# Patient Record
Sex: Female | Born: 1940
Health system: Southern US, Community
[De-identification: ages and names within clinical notes are randomized; demographics above are authoritative.]

## PROBLEM LIST (undated history)

## (undated) ENCOUNTER — Emergency Department (HOSPITAL_COMMUNITY): Admission: EM | Payer: Medicare HMO | Source: Home / Self Care

## (undated) DIAGNOSIS — Z800 Family history of malignant neoplasm of digestive organs: Secondary | ICD-10-CM

## (undated) DIAGNOSIS — R1319 Other dysphagia: Secondary | ICD-10-CM

## (undated) DIAGNOSIS — H269 Unspecified cataract: Secondary | ICD-10-CM

## (undated) DIAGNOSIS — H9319 Tinnitus, unspecified ear: Secondary | ICD-10-CM

## (undated) DIAGNOSIS — R1310 Dysphagia, unspecified: Secondary | ICD-10-CM

## (undated) DIAGNOSIS — M810 Age-related osteoporosis without current pathological fracture: Secondary | ICD-10-CM

## (undated) DIAGNOSIS — E119 Type 2 diabetes mellitus without complications: Secondary | ICD-10-CM

## (undated) DIAGNOSIS — R011 Cardiac murmur, unspecified: Secondary | ICD-10-CM

## (undated) DIAGNOSIS — I10 Essential (primary) hypertension: Secondary | ICD-10-CM

## (undated) DIAGNOSIS — R9389 Abnormal findings on diagnostic imaging of other specified body structures: Secondary | ICD-10-CM

## (undated) DIAGNOSIS — J309 Allergic rhinitis, unspecified: Secondary | ICD-10-CM

## (undated) DIAGNOSIS — N189 Chronic kidney disease, unspecified: Secondary | ICD-10-CM

## (undated) DIAGNOSIS — E7800 Pure hypercholesterolemia, unspecified: Secondary | ICD-10-CM

## (undated) DIAGNOSIS — F411 Generalized anxiety disorder: Secondary | ICD-10-CM

## (undated) DIAGNOSIS — K5730 Diverticulosis of large intestine without perforation or abscess without bleeding: Secondary | ICD-10-CM

## (undated) DIAGNOSIS — Z8489 Family history of other specified conditions: Secondary | ICD-10-CM

## (undated) DIAGNOSIS — R9431 Abnormal electrocardiogram [ECG] [EKG]: Secondary | ICD-10-CM

## (undated) DIAGNOSIS — D126 Benign neoplasm of colon, unspecified: Secondary | ICD-10-CM

## (undated) DIAGNOSIS — C801 Malignant (primary) neoplasm, unspecified: Secondary | ICD-10-CM

## (undated) HISTORY — DX: Pure hypercholesterolemia, unspecified: E78.00

## (undated) HISTORY — DX: Type 2 diabetes mellitus without complications: E11.9

## (undated) HISTORY — DX: Abnormal electrocardiogram (ECG) (EKG): R94.31

## (undated) HISTORY — DX: Other dysphagia: R13.19

## (undated) HISTORY — DX: Abnormal findings on diagnostic imaging of other specified body structures: R93.89

## (undated) HISTORY — DX: Essential (primary) hypertension: I10

## (undated) HISTORY — PX: COLONOSCOPY: SHX174

## (undated) HISTORY — DX: Benign neoplasm of colon, unspecified: D12.6

## (undated) HISTORY — DX: Age-related osteoporosis without current pathological fracture: M81.0

## (undated) HISTORY — DX: Diverticulosis of large intestine without perforation or abscess without bleeding: K57.30

## (undated) HISTORY — DX: Generalized anxiety disorder: F41.1

## (undated) HISTORY — DX: Allergic rhinitis, unspecified: J30.9

## (undated) HISTORY — DX: Family history of malignant neoplasm of digestive organs: Z80.0

## (undated) HISTORY — DX: Chronic kidney disease, unspecified: N18.9

## (undated) HISTORY — DX: Tinnitus, unspecified ear: H93.19

## (undated) HISTORY — DX: Unspecified cataract: H26.9

## (undated) HISTORY — PX: FOOT SURGERY: SHX648

## (undated) HISTORY — PX: OTHER SURGICAL HISTORY: SHX169

## (undated) HISTORY — DX: Dysphagia, unspecified: R13.10

## (undated) HISTORY — PX: CATARACT EXTRACTION: SUR2

## (undated) HISTORY — DX: Malignant (primary) neoplasm, unspecified: C80.1

## (undated) HISTORY — PX: UPPER GASTROINTESTINAL ENDOSCOPY: SHX188

---

## 1998-05-20 ENCOUNTER — Ambulatory Visit (HOSPITAL_COMMUNITY): Admission: RE | Admit: 1998-05-20 | Discharge: 1998-05-20 | Payer: Self-pay | Admitting: *Deleted

## 1999-05-23 ENCOUNTER — Ambulatory Visit (HOSPITAL_COMMUNITY): Admission: RE | Admit: 1999-05-23 | Discharge: 1999-05-23 | Payer: Self-pay | Admitting: *Deleted

## 1999-05-23 ENCOUNTER — Encounter: Payer: Self-pay | Admitting: *Deleted

## 1999-11-09 ENCOUNTER — Other Ambulatory Visit: Admission: RE | Admit: 1999-11-09 | Discharge: 1999-11-09 | Payer: Self-pay | Admitting: *Deleted

## 2000-05-24 ENCOUNTER — Ambulatory Visit (HOSPITAL_COMMUNITY): Admission: RE | Admit: 2000-05-24 | Discharge: 2000-05-24 | Payer: Self-pay | Admitting: *Deleted

## 2000-05-24 ENCOUNTER — Encounter: Payer: Self-pay | Admitting: *Deleted

## 2000-11-12 ENCOUNTER — Other Ambulatory Visit: Admission: RE | Admit: 2000-11-12 | Discharge: 2000-11-12 | Payer: Self-pay | Admitting: *Deleted

## 2001-05-28 ENCOUNTER — Encounter: Payer: Self-pay | Admitting: *Deleted

## 2001-05-28 ENCOUNTER — Ambulatory Visit (HOSPITAL_COMMUNITY): Admission: RE | Admit: 2001-05-28 | Discharge: 2001-05-28 | Payer: Self-pay | Admitting: *Deleted

## 2001-07-08 ENCOUNTER — Encounter (INDEPENDENT_AMBULATORY_CARE_PROVIDER_SITE_OTHER): Payer: Self-pay | Admitting: Gastroenterology

## 2001-11-18 ENCOUNTER — Other Ambulatory Visit: Admission: RE | Admit: 2001-11-18 | Discharge: 2001-11-18 | Payer: Self-pay | Admitting: *Deleted

## 2002-06-02 ENCOUNTER — Encounter: Payer: Self-pay | Admitting: *Deleted

## 2002-06-02 ENCOUNTER — Ambulatory Visit (HOSPITAL_COMMUNITY): Admission: RE | Admit: 2002-06-02 | Discharge: 2002-06-02 | Payer: Self-pay | Admitting: *Deleted

## 2002-11-24 ENCOUNTER — Other Ambulatory Visit: Admission: RE | Admit: 2002-11-24 | Discharge: 2002-11-24 | Payer: Self-pay | Admitting: *Deleted

## 2003-06-08 ENCOUNTER — Encounter: Payer: Self-pay | Admitting: *Deleted

## 2003-06-08 ENCOUNTER — Ambulatory Visit (HOSPITAL_COMMUNITY): Admission: RE | Admit: 2003-06-08 | Discharge: 2003-06-08 | Payer: Self-pay | Admitting: *Deleted

## 2003-12-01 ENCOUNTER — Other Ambulatory Visit: Admission: RE | Admit: 2003-12-01 | Discharge: 2003-12-01 | Payer: Self-pay | Admitting: *Deleted

## 2004-06-08 ENCOUNTER — Ambulatory Visit (HOSPITAL_COMMUNITY): Admission: RE | Admit: 2004-06-08 | Discharge: 2004-06-08 | Payer: Self-pay | Admitting: *Deleted

## 2004-12-05 ENCOUNTER — Other Ambulatory Visit: Admission: RE | Admit: 2004-12-05 | Discharge: 2004-12-05 | Payer: Self-pay | Admitting: *Deleted

## 2004-12-07 ENCOUNTER — Ambulatory Visit: Payer: Self-pay | Admitting: Pulmonary Disease

## 2004-12-12 ENCOUNTER — Ambulatory Visit: Payer: Self-pay

## 2005-01-03 ENCOUNTER — Ambulatory Visit: Payer: Self-pay | Admitting: Gastroenterology

## 2005-01-11 ENCOUNTER — Ambulatory Visit: Payer: Self-pay | Admitting: Gastroenterology

## 2005-02-02 ENCOUNTER — Ambulatory Visit: Payer: Self-pay | Admitting: Internal Medicine

## 2005-06-13 ENCOUNTER — Ambulatory Visit: Payer: Self-pay | Admitting: Pulmonary Disease

## 2005-06-26 ENCOUNTER — Ambulatory Visit (HOSPITAL_COMMUNITY): Admission: RE | Admit: 2005-06-26 | Discharge: 2005-06-26 | Payer: Self-pay | Admitting: *Deleted

## 2005-07-20 ENCOUNTER — Ambulatory Visit: Payer: Self-pay | Admitting: Pulmonary Disease

## 2005-10-25 ENCOUNTER — Ambulatory Visit: Payer: Self-pay | Admitting: Pulmonary Disease

## 2005-11-02 ENCOUNTER — Ambulatory Visit: Payer: Self-pay | Admitting: Pulmonary Disease

## 2005-11-09 ENCOUNTER — Ambulatory Visit: Payer: Self-pay | Admitting: Cardiology

## 2005-12-06 ENCOUNTER — Other Ambulatory Visit: Admission: RE | Admit: 2005-12-06 | Discharge: 2005-12-06 | Payer: Self-pay | Admitting: *Deleted

## 2005-12-12 ENCOUNTER — Ambulatory Visit: Payer: Self-pay | Admitting: Pulmonary Disease

## 2006-02-21 ENCOUNTER — Ambulatory Visit: Payer: Self-pay | Admitting: Pulmonary Disease

## 2006-02-25 ENCOUNTER — Ambulatory Visit: Payer: Self-pay | Admitting: Cardiovascular Disease

## 2006-03-13 ENCOUNTER — Ambulatory Visit: Payer: Self-pay | Admitting: Pulmonary Disease

## 2006-06-27 ENCOUNTER — Ambulatory Visit (HOSPITAL_COMMUNITY): Admission: RE | Admit: 2006-06-27 | Discharge: 2006-06-27 | Payer: Self-pay | Admitting: *Deleted

## 2006-07-04 ENCOUNTER — Ambulatory Visit: Payer: Self-pay | Admitting: Pulmonary Disease

## 2006-07-23 ENCOUNTER — Ambulatory Visit: Payer: Self-pay | Admitting: Pulmonary Disease

## 2006-10-04 ENCOUNTER — Ambulatory Visit: Payer: Self-pay | Admitting: Gastroenterology

## 2006-10-16 ENCOUNTER — Ambulatory Visit: Payer: Self-pay | Admitting: Gastroenterology

## 2006-11-06 ENCOUNTER — Ambulatory Visit: Payer: Self-pay | Admitting: Pulmonary Disease

## 2006-11-06 LAB — CONVERTED CEMR LAB
AST: 21 units/L (ref 0–37)
Albumin: 3.9 g/dL (ref 3.5–5.2)
Alkaline Phosphatase: 63 units/L (ref 39–117)
CO2: 26 meq/L (ref 19–32)
Chloride: 107 meq/L (ref 96–112)
Cholesterol: 163 mg/dL (ref 0–200)
Creatinine, Ser: 0.9 mg/dL (ref 0.4–1.2)
GFR calc Af Amer: 81 mL/min
GFR calc non Af Amer: 67 mL/min
Glucose, Bld: 101 mg/dL — ABNORMAL HIGH (ref 70–99)
LDL Cholesterol: 100 mg/dL — ABNORMAL HIGH (ref 0–99)
Total CHOL/HDL Ratio: 3.2

## 2006-12-24 ENCOUNTER — Other Ambulatory Visit: Admission: RE | Admit: 2006-12-24 | Discharge: 2006-12-24 | Payer: Self-pay | Admitting: *Deleted

## 2007-05-07 ENCOUNTER — Ambulatory Visit: Payer: Self-pay | Admitting: Pulmonary Disease

## 2007-05-07 LAB — CONVERTED CEMR LAB
ALT: 17 units/L (ref 0–35)
Albumin: 4.1 g/dL (ref 3.5–5.2)
BUN: 10 mg/dL (ref 6–23)
CO2: 27 meq/L (ref 19–32)
Calcium: 10.1 mg/dL (ref 8.4–10.5)
Chloride: 106 meq/L (ref 96–112)
Glucose, Bld: 111 mg/dL — ABNORMAL HIGH (ref 70–99)
HDL: 44.9 mg/dL (ref >39.0)
LDL Cholesterol: 107 mg/dL — ABNORMAL HIGH (ref 0–99)
Total CHOL/HDL Ratio: 3.7
VLDL: 13 mg/dL (ref 0–40)

## 2007-07-01 ENCOUNTER — Ambulatory Visit (HOSPITAL_COMMUNITY): Admission: RE | Admit: 2007-07-01 | Discharge: 2007-07-01 | Payer: Self-pay | Admitting: *Deleted

## 2007-07-29 DIAGNOSIS — D126 Benign neoplasm of colon, unspecified: Secondary | ICD-10-CM | POA: Insufficient documentation

## 2007-07-29 DIAGNOSIS — E119 Type 2 diabetes mellitus without complications: Secondary | ICD-10-CM

## 2007-07-29 DIAGNOSIS — I10 Essential (primary) hypertension: Secondary | ICD-10-CM | POA: Insufficient documentation

## 2007-07-29 DIAGNOSIS — E118 Type 2 diabetes mellitus with unspecified complications: Secondary | ICD-10-CM | POA: Insufficient documentation

## 2007-07-29 DIAGNOSIS — E785 Hyperlipidemia, unspecified: Secondary | ICD-10-CM

## 2007-07-29 DIAGNOSIS — E1169 Type 2 diabetes mellitus with other specified complication: Secondary | ICD-10-CM | POA: Insufficient documentation

## 2007-07-29 DIAGNOSIS — K219 Gastro-esophageal reflux disease without esophagitis: Secondary | ICD-10-CM | POA: Insufficient documentation

## 2007-07-29 DIAGNOSIS — Z87898 Personal history of other specified conditions: Secondary | ICD-10-CM | POA: Insufficient documentation

## 2007-07-29 DIAGNOSIS — M159 Polyosteoarthritis, unspecified: Secondary | ICD-10-CM | POA: Insufficient documentation

## 2007-07-29 DIAGNOSIS — J309 Allergic rhinitis, unspecified: Secondary | ICD-10-CM | POA: Insufficient documentation

## 2007-07-29 DIAGNOSIS — R7303 Prediabetes: Secondary | ICD-10-CM | POA: Insufficient documentation

## 2007-07-29 DIAGNOSIS — E782 Mixed hyperlipidemia: Secondary | ICD-10-CM | POA: Insufficient documentation

## 2007-08-01 ENCOUNTER — Ambulatory Visit: Payer: Self-pay | Admitting: Pulmonary Disease

## 2007-10-01 ENCOUNTER — Telehealth (INDEPENDENT_AMBULATORY_CARE_PROVIDER_SITE_OTHER): Payer: Self-pay | Admitting: *Deleted

## 2007-10-10 ENCOUNTER — Telehealth: Payer: Self-pay | Admitting: Pulmonary Disease

## 2007-11-12 ENCOUNTER — Ambulatory Visit: Payer: Self-pay | Admitting: Pulmonary Disease

## 2007-11-12 DIAGNOSIS — K5730 Diverticulosis of large intestine without perforation or abscess without bleeding: Secondary | ICD-10-CM | POA: Insufficient documentation

## 2007-11-12 DIAGNOSIS — F411 Generalized anxiety disorder: Secondary | ICD-10-CM | POA: Insufficient documentation

## 2007-11-12 DIAGNOSIS — H9319 Tinnitus, unspecified ear: Secondary | ICD-10-CM | POA: Insufficient documentation

## 2007-11-23 LAB — CONVERTED CEMR LAB
ALT: 17 units/L (ref 0–35)
Bilirubin Urine: NEGATIVE
Bilirubin, Direct: 0.1 mg/dL (ref 0.0–0.3)
Cholesterol: 163 mg/dL (ref 0–200)
Creatinine, Ser: 0.8 mg/dL (ref 0.4–1.2)
Eosinophils Absolute: 0 10*3/uL (ref 0.0–0.6)
GFR calc Af Amer: 92 mL/min
GFR calc non Af Amer: 76 mL/min
HCT: 39.3 % (ref 36.0–46.0)
Hemoglobin, Urine: NEGATIVE
Hemoglobin: 12.7 g/dL (ref 12.0–15.0)
Leukocytes, UA: NEGATIVE
Lymphocytes Relative: 43.3 % (ref 12.0–46.0)
Monocytes Relative: 6.2 % (ref 3.0–11.0)
Neutro Abs: 2.8 10*3/uL (ref 1.4–7.7)
Nitrite: NEGATIVE
Platelets: 316 10*3/uL (ref 150–400)
RDW: 12.3 % (ref 11.5–14.6)
Total Bilirubin: 0.6 mg/dL (ref 0.3–1.2)
Total CHOL/HDL Ratio: 3.2
Urine Glucose: NEGATIVE mg/dL
Urobilinogen, UA: 0.2 (ref 0.0–1.0)
WBC: 5.7 10*3/uL (ref 4.5–10.5)

## 2007-12-02 ENCOUNTER — Ambulatory Visit: Payer: Self-pay

## 2007-12-02 ENCOUNTER — Encounter: Payer: Self-pay | Admitting: Pulmonary Disease

## 2007-12-15 ENCOUNTER — Telehealth (INDEPENDENT_AMBULATORY_CARE_PROVIDER_SITE_OTHER): Payer: Self-pay | Admitting: *Deleted

## 2007-12-31 ENCOUNTER — Encounter: Payer: Self-pay | Admitting: Pulmonary Disease

## 2008-04-22 ENCOUNTER — Telehealth (INDEPENDENT_AMBULATORY_CARE_PROVIDER_SITE_OTHER): Payer: Self-pay | Admitting: *Deleted

## 2008-05-12 ENCOUNTER — Ambulatory Visit: Payer: Self-pay | Admitting: Pulmonary Disease

## 2008-05-12 DIAGNOSIS — R011 Cardiac murmur, unspecified: Secondary | ICD-10-CM | POA: Insufficient documentation

## 2008-05-15 LAB — CONVERTED CEMR LAB
ALT: 18 units/L (ref 0–35)
AST: 21 units/L (ref 0–37)
Alkaline Phosphatase: 70 units/L (ref 39–117)
BUN: 11 mg/dL (ref 6–23)
Bilirubin, Direct: 0.1 mg/dL (ref 0.0–0.3)
CO2: 29 meq/L (ref 19–32)
Chloride: 105 meq/L (ref 96–112)
Cholesterol: 144 mg/dL (ref 0–200)
GFR calc Af Amer: 92 mL/min
GFR calc non Af Amer: 76 mL/min
LDL Cholesterol: 80 mg/dL (ref 0–99)
Sodium: 142 meq/L (ref 135–145)
Total Protein: 7.3 g/dL (ref 6.0–8.3)

## 2008-06-30 ENCOUNTER — Ambulatory Visit: Payer: Self-pay | Admitting: Pulmonary Disease

## 2008-07-01 ENCOUNTER — Ambulatory Visit (HOSPITAL_COMMUNITY): Admission: RE | Admit: 2008-07-01 | Discharge: 2008-07-01 | Payer: Self-pay | Admitting: Gynecology

## 2008-08-27 ENCOUNTER — Telehealth (INDEPENDENT_AMBULATORY_CARE_PROVIDER_SITE_OTHER): Payer: Self-pay | Admitting: *Deleted

## 2008-09-21 ENCOUNTER — Encounter: Payer: Self-pay | Admitting: Pulmonary Disease

## 2008-09-30 ENCOUNTER — Encounter (INDEPENDENT_AMBULATORY_CARE_PROVIDER_SITE_OTHER): Payer: Self-pay | Admitting: *Deleted

## 2008-09-30 ENCOUNTER — Ambulatory Visit: Payer: Self-pay | Admitting: Pulmonary Disease

## 2008-11-09 ENCOUNTER — Telehealth (INDEPENDENT_AMBULATORY_CARE_PROVIDER_SITE_OTHER): Payer: Self-pay | Admitting: *Deleted

## 2008-11-16 ENCOUNTER — Telehealth (INDEPENDENT_AMBULATORY_CARE_PROVIDER_SITE_OTHER): Payer: Self-pay | Admitting: *Deleted

## 2008-11-17 ENCOUNTER — Telehealth: Payer: Self-pay | Admitting: Physician Assistant

## 2008-11-17 ENCOUNTER — Ambulatory Visit: Payer: Self-pay | Admitting: Gastroenterology

## 2008-11-17 DIAGNOSIS — R1319 Other dysphagia: Secondary | ICD-10-CM | POA: Insufficient documentation

## 2008-11-23 ENCOUNTER — Ambulatory Visit: Payer: Self-pay | Admitting: Gastroenterology

## 2008-11-25 ENCOUNTER — Telehealth: Payer: Self-pay | Admitting: Gastroenterology

## 2008-11-29 ENCOUNTER — Telehealth (INDEPENDENT_AMBULATORY_CARE_PROVIDER_SITE_OTHER): Payer: Self-pay | Admitting: *Deleted

## 2008-12-28 ENCOUNTER — Ambulatory Visit: Payer: Self-pay | Admitting: Pulmonary Disease

## 2009-01-21 ENCOUNTER — Encounter: Payer: Self-pay | Admitting: Pulmonary Disease

## 2009-03-17 ENCOUNTER — Ambulatory Visit: Payer: Self-pay | Admitting: Internal Medicine

## 2009-03-17 ENCOUNTER — Encounter: Payer: Self-pay | Admitting: Adult Health

## 2009-03-17 LAB — CONVERTED CEMR LAB
Ketones, ur: NEGATIVE mg/dL
Leukocytes, UA: NEGATIVE
Specific Gravity, Urine: 1.015 (ref 1.000–1.030)
Total Protein, Urine: NEGATIVE mg/dL
Urine Glucose: NEGATIVE mg/dL

## 2009-06-28 ENCOUNTER — Ambulatory Visit: Payer: Self-pay | Admitting: Pulmonary Disease

## 2009-06-28 DIAGNOSIS — R9431 Abnormal electrocardiogram [ECG] [EKG]: Secondary | ICD-10-CM | POA: Insufficient documentation

## 2009-06-28 LAB — CONVERTED CEMR LAB
ALT: 19 units/L (ref 0–35)
AST: 22 units/L (ref 0–37)
Albumin: 3.9 g/dL (ref 3.5–5.2)
Alkaline Phosphatase: 60 units/L (ref 39–117)
BUN: 8 mg/dL (ref 6–23)
Basophils Absolute: 0 10*3/uL (ref 0.0–0.1)
Basophils Relative: 0.7 % (ref 0.0–3.0)
Bilirubin, Direct: 0 mg/dL (ref 0.0–0.3)
Chloride: 108 meq/L (ref 96–112)
Cholesterol: 158 mg/dL (ref 0–200)
Hemoglobin: 13 g/dL (ref 12.0–15.0)
LDL Cholesterol: 99 mg/dL (ref 0–99)
Lymphs Abs: 2.3 10*3/uL (ref 0.7–4.0)
Monocytes Absolute: 0.4 10*3/uL (ref 0.1–1.0)
Neutro Abs: 2.5 10*3/uL (ref 1.4–7.7)
Neutrophils Relative %: 46 % (ref 43.0–77.0)
Platelets: 252 10*3/uL (ref 150.0–400.0)
Potassium: 4.4 meq/L (ref 3.5–5.1)
RBC: 4.69 M/uL (ref 3.87–5.11)
RDW: 12.3 % (ref 11.5–14.6)
TSH: 1.2 microintl units/mL (ref 0.35–5.50)
Total CHOL/HDL Ratio: 3

## 2009-07-01 ENCOUNTER — Telehealth (INDEPENDENT_AMBULATORY_CARE_PROVIDER_SITE_OTHER): Payer: Self-pay | Admitting: *Deleted

## 2009-07-14 ENCOUNTER — Ambulatory Visit (HOSPITAL_COMMUNITY): Admission: RE | Admit: 2009-07-14 | Discharge: 2009-07-14 | Payer: Self-pay | Admitting: Gynecology

## 2009-07-15 ENCOUNTER — Ambulatory Visit: Payer: Self-pay | Admitting: Pulmonary Disease

## 2009-08-23 ENCOUNTER — Telehealth: Payer: Self-pay | Admitting: Pulmonary Disease

## 2009-09-14 ENCOUNTER — Telehealth (INDEPENDENT_AMBULATORY_CARE_PROVIDER_SITE_OTHER): Payer: Self-pay | Admitting: *Deleted

## 2009-10-27 ENCOUNTER — Telehealth (INDEPENDENT_AMBULATORY_CARE_PROVIDER_SITE_OTHER): Payer: Self-pay | Admitting: *Deleted

## 2009-11-16 ENCOUNTER — Telehealth: Payer: Self-pay | Admitting: Pulmonary Disease

## 2009-11-22 ENCOUNTER — Ambulatory Visit: Payer: Self-pay | Admitting: Pulmonary Disease

## 2009-12-12 ENCOUNTER — Telehealth (INDEPENDENT_AMBULATORY_CARE_PROVIDER_SITE_OTHER): Payer: Self-pay | Admitting: *Deleted

## 2009-12-28 ENCOUNTER — Ambulatory Visit: Payer: Self-pay | Admitting: Pulmonary Disease

## 2010-01-01 LAB — CONVERTED CEMR LAB
AST: 19 units/L (ref 0–37)
Albumin: 4 g/dL (ref 3.5–5.2)
Alkaline Phosphatase: 53 units/L (ref 39–117)
Bilirubin, Direct: 0.1 mg/dL (ref 0.0–0.3)
Calcium: 9.8 mg/dL (ref 8.4–10.5)
Cholesterol: 162 mg/dL (ref 0–200)
Creatinine, Ser: 0.9 mg/dL (ref 0.4–1.2)
GFR calc non Af Amer: 79.75 mL/min (ref >60)
HDL: 52.8 mg/dL (ref >39.00)
Hgb A1c MFr Bld: 6.1 % (ref 4.6–6.5)
LDL Cholesterol: 97 mg/dL (ref 0–99)
Sodium: 141 meq/L (ref 135–145)
Total Bilirubin: 0.2 mg/dL — ABNORMAL LOW (ref 0.3–1.2)
Triglycerides: 63 mg/dL (ref 0.0–149.0)

## 2010-01-16 ENCOUNTER — Telehealth (INDEPENDENT_AMBULATORY_CARE_PROVIDER_SITE_OTHER): Payer: Self-pay | Admitting: *Deleted

## 2010-01-25 ENCOUNTER — Encounter: Payer: Self-pay | Admitting: Pulmonary Disease

## 2010-02-09 ENCOUNTER — Ambulatory Visit: Payer: Self-pay | Admitting: Pulmonary Disease

## 2010-02-22 ENCOUNTER — Ambulatory Visit: Payer: Self-pay | Admitting: Gastroenterology

## 2010-06-28 ENCOUNTER — Ambulatory Visit: Payer: Self-pay | Admitting: Pulmonary Disease

## 2010-06-28 ENCOUNTER — Encounter: Payer: Self-pay | Admitting: Pulmonary Disease

## 2010-07-01 LAB — CONVERTED CEMR LAB
ALT: 16 units/L (ref 0–35)
AST: 20 units/L (ref 0–37)
Albumin: 4 g/dL (ref 3.5–5.2)
Basophils Relative: 0.6 % (ref 0.0–3.0)
Bilirubin Urine: NEGATIVE
Bilirubin, Direct: 0.1 mg/dL (ref 0.0–0.3)
Calcium: 9.9 mg/dL (ref 8.4–10.5)
Chloride: 105 meq/L (ref 96–112)
Cholesterol: 169 mg/dL (ref 0–200)
Creatinine, Ser: 0.8 mg/dL (ref 0.4–1.2)
Eosinophils Absolute: 0 10*3/uL (ref 0.0–0.7)
HDL: 44.6 mg/dL (ref >39.00)
Hemoglobin, Urine: NEGATIVE
Hemoglobin: 12.6 g/dL (ref 12.0–15.0)
LDL Cholesterol: 113 mg/dL — ABNORMAL HIGH (ref 0–99)
MCHC: 33.5 g/dL (ref 30.0–36.0)
Monocytes Absolute: 0.4 10*3/uL (ref 0.1–1.0)
Nitrite: NEGATIVE
RBC: 4.5 M/uL (ref 3.87–5.11)
TSH: 1.31 microintl units/mL (ref 0.35–5.50)
Total CHOL/HDL Ratio: 4
Total Protein, Urine: NEGATIVE mg/dL
Triglycerides: 58 mg/dL (ref 0.0–149.0)
Urine Glucose: NEGATIVE mg/dL
Urobilinogen, UA: 1 (ref 0.0–1.0)

## 2010-07-10 ENCOUNTER — Telehealth (INDEPENDENT_AMBULATORY_CARE_PROVIDER_SITE_OTHER): Payer: Self-pay | Admitting: *Deleted

## 2010-07-19 ENCOUNTER — Ambulatory Visit (HOSPITAL_COMMUNITY): Admission: RE | Admit: 2010-07-19 | Discharge: 2010-07-19 | Payer: Self-pay | Admitting: Gynecology

## 2010-07-25 ENCOUNTER — Telehealth: Payer: Self-pay | Admitting: Pulmonary Disease

## 2010-07-26 ENCOUNTER — Telehealth (INDEPENDENT_AMBULATORY_CARE_PROVIDER_SITE_OTHER): Payer: Self-pay | Admitting: *Deleted

## 2010-10-25 ENCOUNTER — Telehealth (INDEPENDENT_AMBULATORY_CARE_PROVIDER_SITE_OTHER): Payer: Self-pay | Admitting: *Deleted

## 2010-10-25 ENCOUNTER — Telehealth: Payer: Self-pay | Admitting: Gastroenterology

## 2010-10-26 ENCOUNTER — Encounter: Payer: Self-pay | Admitting: Gastroenterology

## 2010-10-26 ENCOUNTER — Encounter (INDEPENDENT_AMBULATORY_CARE_PROVIDER_SITE_OTHER): Payer: Self-pay | Admitting: *Deleted

## 2010-11-09 ENCOUNTER — Ambulatory Visit (HOSPITAL_COMMUNITY)
Admission: RE | Admit: 2010-11-09 | Discharge: 2010-11-09 | Disposition: A | Discharge status: Home / Self Care | Payer: Medicare Other | Source: Ambulatory Visit | Attending: Gastroenterology | Admitting: Gastroenterology

## 2010-11-09 ENCOUNTER — Encounter: Payer: Self-pay | Admitting: Gastroenterology

## 2010-11-09 ENCOUNTER — Encounter: Payer: Medicare Other | Admitting: Gastroenterology

## 2010-11-09 DIAGNOSIS — Z01812 Encounter for preprocedural laboratory examination: Secondary | ICD-10-CM | POA: Insufficient documentation

## 2010-11-09 DIAGNOSIS — R1310 Dysphagia, unspecified: Secondary | ICD-10-CM

## 2010-11-09 DIAGNOSIS — K449 Diaphragmatic hernia without obstruction or gangrene: Secondary | ICD-10-CM

## 2010-11-09 DIAGNOSIS — K222 Esophageal obstruction: Secondary | ICD-10-CM

## 2010-11-09 DIAGNOSIS — K440 Diaphragmatic hernia with obstruction, without gangrene: Secondary | ICD-10-CM | POA: Insufficient documentation

## 2010-11-09 LAB — GLUCOSE, CAPILLARY: Glucose-Capillary: 86 mg/dL (ref 70–99)

## 2010-11-09 NOTE — Assessment & Plan Note (Signed)
Review of gastrointestinal problems: 1. Dysphasia, likely from Schatzki's ring noted and dilated to 20 mm during EGD February, 2010. 2. History of colonic polyps, Unclear pathology since they were not sinceTo pathology, SML 1998.  Never had confirmed adenomatous polyps on followup colonoscopy 2002, 2008. Was recommended by [facility] to have a repeat colonoscopy at 7 year interval following her last one in 2008   History of Present Illness Visit Type: Follow-up Visit Primary GI MD: Rob Bunting MD Primary Provider: Alroy Dust, MD Requesting Provider: Alroy Dust, MD Chief Complaint: dysphagia History of Present Illness:     pleasant 70 year old woman whom I saw about one and a half years ago at the time of an EGD. Those results are summarized above.  She has recently had some dyspeptic-type symptoms. She is very clear today that she did not have trouble with swallowing or dysphagia. She would feel that food hung heavy in her stomach for 2-3 hours after eating however.   she was told to double her proton pump inhibitor to twice daily. She did that for one to 2 days and her symptoms completely resolved.  she stopped the recommeded prilosec for unclear reasons.  Lately she is feeling fine, withou any dysphagia or dyspepsia.    has lost 5 pounds since visit one year ago here in GI.           Current Medications (verified): 1)  Fexofenadine Hcl 180 Mg Tabs (Fexofenadine Hcl) .... Take 1 Tablet By Mouth Once A Day As Needed For Allergies.Marland KitchenMarland Kitchen 2)  Adult Aspirin Ec Low Strength 81 Mg  Tbec (Aspirin) .... Take 1 Tablet By Mouth Once A Day 3)  Norvasc 10 Mg  Tabs (Amlodipine Besylate) .... Take 1 Tablet By Mouth Once A Day 4)  Simvastatin 40 Mg  Tabs (Simvastatin) .... Take 1 Tablet At Bedtime 5)  Metformin Hcl 500 Mg  Tabs (Metformin Hcl) .... Take 1 Tablet Two Times A Day 6)  Omeprazole 20 Mg Cpdr (Omeprazole) .... Take 1 Cap By Mouth Once Daily- 30 Min Before A Meal... 7)  Accu-Chek Aviva  Strp  (Glucose Blood) .... Use As Directed 8)  Accu-Chek Aviva  Kit (Blood Glucose Monitoring Suppl) .... Use As Directed 9)  Accu-Chek Aviva  Soln (Blood Glucose Calibration) 10)  Alprazolam 0.5 Mg Tabs (Alprazolam) .... Take 1/2 To 1 Tab By Mouth Three Times A Day As Needed For Nerves...  Allergies (verified): No Known Drug Allergies  Vital Signs:  Patient profile:   70 year old female Height:      64 inches Weight:      174.25 pounds BMI:     30.02 Pulse rate:   76 / minute Pulse rhythm:   regular BP sitting:   116 / 68  (left arm) Cuff size:   regular  Vitals Entered By: June McMurray CMA Duncan Dull) (Feb 22, 2010 2:12 PM)  Physical Exam  Additional Exam:  Constitutional: generally well appearing Psychiatric: alert and oriented times 3 Abdomen: soft, non-tender, non-distended, normal bowel sounds    Impression & Recommendations:  Problem # 1:  Dyspepsia, resolved her symptoms resolved after just one to 2 days of proton pump inhibitor. She stop the medicine at that point. She has not been bothered and to 3 weeks. She had an EGD one year ago which was normal except for a Schatzki's ring. I do not think we need to repeat her endoscopy since her symptoms have resolved already. She knows however to call if new problems arise.  Patient Instructions: 1)  Lactose intolerance handout given. 2)  Call Dr. Christella Hartigan' office if new symptoms arise. 3)  The medication list was reviewed and reconciled.  All changed / newly prescribed medications were explained.  A complete medication list was provided to the patient / caregiver.

## 2010-11-09 NOTE — Assessment & Plan Note (Signed)
Summary: rov 6 months///kp   Primary Care Provider:  Alroy Dust, MD  CC:  6 month ROV & review of mult medical prob....  History of Present Illness: 70 y/o BF here for a follow up visit and on-going management of mult med problems including- HBP, DM, Chol, etc...    ~  December 28, 2008:  she is c/o feeling "puffy" in her abd and is considering taking "Digest-Zyme" from her chiropractor... we discussed poss alternatives such as Activia yogurt and Align probiotics... her weight = 180# but she objects "it's just 178 on my home scale", and she is happy w/ her current weight, not motivated to reduce "I'd look terrible"... BS's= 100-115 OK... she had some dysphagia & saw DrJacobs w/ EGD showing Schatzki ring w/ balloon dilatation- improved...   ~  June 28, 2009:  30mo f/u doing well- just notes some mild constipation & we discussed options for Miralax/ Senakot-S/ etc... BP controlled- denies CP, palpit, etc...   ~  December 28, 2009:  30mo f/u doing well- no new complaints or concerns, notes rare palpit, no CP, no SOB, no edema... she saw TP 2/11 w/ HA and BP elevated but now she says this was a reaction to "ginsing tea"> no recurrence & BP controlled on Norvasc... requests refills w/ 90d supplies.    Current Problem List:  ALLERGIC RHINITIS (ICD-477.9) - she uses Allegra Prn...  HYPERTENSION (ICD-401.9) - controlled on NORVASC 10mg /d and takes ASA 81mg /d as well...  BP= 110/70 today & similar at home... tolerated well and denies HA, fatigue, visual changes, CP, palipit, dizziness, syncope, dyspnea, edema, etc...  ABNORMAL ELECTROCARDIOGRAM (ICD-794.31) & ECHOCARDIOGRAM, ABNORMAL (ICD-793.2) - she is asymtomatic without CP, palpit, SOB, dizziness or syncope... she is active w/ walking, treadmill, etc...  ~  baseline EKG showed NSR, poor R progression V1-3 w/ NSSTTWA... no change in f/u tracings.  ~  2DEcho in 2006 showed only some mild SAM, tr MR, tr AI, vigorous LVF w/ sm cavity & EF=75% (no  change from 2002)...  ~  2DEcho 2/09 showed:  Mod thickening of the MV leaflets, & mild calcif of the AoV... The left ventricle was mildly dilated. Overall left ventricular systolic function was normal. Left ventricular ejection fraction was estimated to be 60 %. There were no left ventricular regional wall motion abnormalities. Left ventricular wall thickness was mildly increased. There was mild focal basal septal hypertrophy. There was no systolic anterior motion of the mitral valve. There was Doppler evidence for dynamic left ventricular mid-cavity obstruction during Valsalva, with a peak velocity of 2.1 m/sec , and with a peak gradient of 18 mmHg. Doppler parameters were consistent with abnormal left ventricular relaxation.  HYPERCHOLESTEROLEMIA (ICD-272.0) - on SIMVASTATIN 40mg /d.  ~  FLP 7/08 showed TChol 165, Tg 65, HDL 45, LDL 107  ~  FLP 2/09 showed TChol 163, Tg 66, HDL 51, LDL 98... continue same.  ~  FLP 8/09 showed TChol 144, TG 63, HDL 52, LDL 80  ~  FLP 9/10 showed TChol 158, TG 60, HDL 47, LDL 99  ~  FLP 3/11 showed TChol 162, TG 63, HDL 53, LDL 97  DM (ICD-250.00) - doing well on METFORMIN 500mg Bid + diet... BS at home all  ~100-115 per pt.  ~  labs 7/08 showed BS=111, HgA1c=6.1  ~  labs 2.09 showed BS= 112, HgA1c= 6.3.Marland KitchenMarland Kitchen continue same.  ~  labs 8/09 showed BS= 111, HgA1c= 6.4  ~  labs 9/10 showed BS= 106, A1c= not done.  ~  labs 3/11 showed BS= 93, A1c= 6.1  GERD (ICD-530.81) - she had EGD in 2002 showing 2cm HH, gastritis, duodenitis... controlled on OMEPRAZOLE 20mg /d...  ~  EGD 2/10 by DrJacobs showed HH/ Schatzki ring w/ balloon dilatation...  DIVERTICULOSIS OF COLON (ICD-562.10) & COLONIC POLYPS (ICD-211.3) - last colonoscopy was 1/08 by DrSam showing divertics only... f/u 78yrs planned due to [facility] w/ colon Ca...  DEGENERATIVE JOINT DISEASE, GENERALIZED (ICD-715.00)  HEADACHES, HX OF (ICD-V13.8) - CTBrain in 2007 w/ some atrophy.  Hx of TINNITUS (ICD-388.30)  ANXIETY  (ICD-300.00) - on ALPRAZOLAM 0.5mg  Prn...  Health Maintenance:  ~  GI= DrJacobs as above...  ~  GYN= sees DrMezer since DrFore passed away- PAP, Mammogram & BMD thru his office.  ~  Immunizations:  yearly flu shots... PNEUMOVAX 9/10 at age 81...   Allergies (verified): No Known Drug Allergies  Past History:  Past Medical History:  ALLERGIC RHINITIS (ICD-477.9) HYPERTENSION (ICD-401.9) ABNORMAL ELECTROCARDIOGRAM (ICD-794.31) ECHOCARDIOGRAM, ABNORMAL (ICD-793.2) HYPERCHOLESTEROLEMIA (ICD-272.0) DM (ICD-250.00) GERD (ICD-530.81) DYSPHAGIA (ICD-787.29) DIVERTICULOSIS OF COLON (ICD-562.10) COLONIC POLYPS (ICD-211.3) FAMILY HX COLON CANCER (ICD-V16.0) DEGENERATIVE JOINT DISEASE, GENERALIZED (ICD-715.00) HEADACHES, HX OF (ICD-V13.8) Hx of TINNITUS (ICD-388.30) ANXIETY (ICD-300.00)  Past Surgical History: S/P C Section S/P Bilat foot surg for hammer toes     Family History: Father died age 66 w/ lung cancer, ASHD, stroke. Mother died age 57 w/ MI, HBP. 7 Siblings:  4 Brothers- one w/ prostate cancer 3 Sisters- one died w/ colon cancer  Social History: Married, husb= [place], 76yrs 3 Children- 1 son w/ hodgkins, 2 daugh alive & well. Ex-smoker, quit 10 years ago No alcohol High School education employ as cook  Review of Systems      See HPI  The patient denies anorexia, fever, weight loss, weight gain, vision loss, decreased hearing, hoarseness, chest pain, syncope, dyspnea on exertion, peripheral edema, prolonged cough, headaches, hemoptysis, abdominal pain, melena, hematochezia, severe indigestion/heartburn, hematuria, incontinence, muscle weakness, suspicious skin lesions, transient blindness, difficulty walking, depression, unusual weight change, abnormal bleeding, enlarged lymph nodes, and angioedema.    Vital Signs:  Patient profile:   70 year old female Height:      64 inches Weight:      179.13 pounds BMI:     30.86 O2 Sat:      97 % on Room air Temp:      97.3 degrees F oral Pulse rate:   78 / minute BP sitting:   110 / 70  (right arm) Cuff size:   regular  Vitals Entered By: Randell Loop CMA (December 28, 2009 9:53 AM)  O2 Sat at Rest %:  97 O2 Flow:  Room air CC: 6 month ROV & review of mult medical prob... Is Patient Diabetic? No Pain Assessment Patient in pain? no      Comments meds updated today   Physical Exam  Additional Exam:  WD, WN, 69 y/o BF in NAD...  GENERAL:  Alert & oriented; pleasant & cooperative... HEENT:  [place]/AT, EOM-wnl, PERRLA, EACs-clear, TMs-wnl, NOSE-clear, THROAT-clear & wnl. NECK:  Supple w/ fairROM; no JVD; normal carotid impulses w/o bruits; no thyromegaly or nodules palpated; no lymphadenopathy. CHEST:  Clear to P & A; without wheezes/ rales/ or rhonchi. HEART:  Regular Rhythm, gr 1/6 SEM without rubs or gallops heard... ABDOMEN:  Soft & nontender, mild panniculus, normal bowel sounds; no organomegaly or masses detected. EXT: without deformities, mild arthritic changes; no varicose veins/ venous insuffic/ or edema. NEURO:  CN's intact;  no focal neuro deficits... DERM:  No lesions noted; no rash etc...    Impression & Recommendations:  Problem # 1:  HYPERTENSION (ICD-401.9) BP controlled- continue same meds. Her updated medication list for this problem includes:    Norvasc 10 Mg Tabs (Amlodipine besylate) .Marland Kitchen... Take 1 tablet by mouth once a day  Orders: TLB-Lipid Panel (80061-LIPID) TLB-BMP (Basic Metabolic Panel-BMET) (80048-METABOL) TLB-Hepatic/Liver Function Pnl (80076-HEPATIC) TLB-A1C / Hgb A1C (Glycohemoglobin) (83036-A1C)  Problem # 2:  ECHOCARDIOGRAM, ABNORMAL (ICD-793.2) She remains asymptomatic...   Problem # 3:  HYPERCHOLESTEROLEMIA (ICD-272.0) Controlled on Simva40... continue same. Her updated medication list for this problem includes:    Simvastatin 40 Mg Tabs (Simvastatin) .Marland Kitchen... Take 1 tablet at bedtime  Problem # 4:  DM (ICD-250.00) Stable on Metformin Bid... Her updated  medication list for this problem includes:    Adult Aspirin Ec Low Strength 81 Mg Tbec (Aspirin) .Marland Kitchen... Take 1 tablet by mouth once a day    Metformin Hcl 500 Mg Tabs (Metformin hcl) .Marland Kitchen... Take 1 tablet two times a day  Problem # 5:  GERD (ICD-530.81) GI is stable & followed by DrJacobs... Her updated medication list for this problem includes:    Omeprazole 20 Mg Cpdr (Omeprazole) .Marland Kitchen... Take 1 cap by mouth once daily- 30 min before a meal...  Problem # 6:  ANXIETY (ICD-300.00) Rx for Alprazolam refilled per request... Her updated medication list for this problem includes:    Alprazolam 0.5 Mg Tabs (Alprazolam) .Marland Kitchen... Take 1/2 to 1 tab by mouth three times a day as needed for nerves...  Complete Medication List: 1)  Fexofenadine Hcl 180 Mg Tabs (Fexofenadine hcl) .... Take 1 tablet by mouth once a day as needed for allergies.Marland KitchenMarland Kitchen 2)  Adult Aspirin Ec Low Strength 81 Mg Tbec (Aspirin) .... Take 1 tablet by mouth once a day 3)  Norvasc 10 Mg Tabs (Amlodipine besylate) .... Take 1 tablet by mouth once a day 4)  Simvastatin 40 Mg Tabs (Simvastatin) .... Take 1 tablet at bedtime 5)  Metformin Hcl 500 Mg Tabs (Metformin hcl) .... Take 1 tablet two times a day 6)  Omeprazole 20 Mg Cpdr (Omeprazole) .... Take 1 cap by mouth once daily- 30 min before a meal... 7)  Onetouch Ultra Test Strp (Glucose blood) .... Test blood sugar as directed 8)  Onetouch Ultra System W/device Kit (Blood glucose monitoring suppl) .... Use as directed 9)  Accu-chek Aviva Strp (Glucose blood) .... Test one time a day 10)  Alprazolam 0.5 Mg Tabs (Alprazolam) .... Take 1/2 to 1 tab by mouth three times a day as needed for nerves...  Other Orders: Prescription Created Electronically [phone]) Tdap => 23yrs IM (60454) Admin 1st Vaccine (09811)  Patient Instructions: 1)  Today we updated your med list- see below.... 2)  We refilled your meds for 2011... 3)  Today we did your follow up FASTING blood work...  please call the  "phone tree" in a few days for your lab results.Marland KitchenMarland Kitchen  4)  We also gave you the combination Tetanus vaccine called the TDAP- it should be good for 27yrs. 5)  Let's get on track w/ our diet + exerc program... 6)  Call for any problems.Marland KitchenMarland Kitchen 7)  Please schedule a follow-up appointment in 6 months. Prescriptions: ALPRAZOLAM 0.5 MG TABS (ALPRAZOLAM) take 1/2 to 1 tab by mouth three times a day as needed for nerves...  #100 x prn   Entered and Authorized by:   Michele Mcalpine MD   Signed by:   Michele Mcalpine MD on  12/28/2009   Method used:   Print then Give to Patient   RxID:   [id number] OMEPRAZOLE 20 MG CPDR (OMEPRAZOLE) take 1 cap by mouth once daily- 30 min before a meal...  #90 x prn   Entered and Authorized by:   Michele Mcalpine MD   Signed by:   Michele Mcalpine MD on 12/28/2009   Method used:   Print then Give to Patient   RxID:   [id number] METFORMIN HCL 500 MG  TABS (METFORMIN HCL) take 1 tablet two times a day  #180 x prn   Entered and Authorized by:   Michele Mcalpine MD   Signed by:   Michele Mcalpine MD on 12/28/2009   Method used:   Print then Give to Patient   RxID:   [id number] SIMVASTATIN 40 MG  TABS (SIMVASTATIN) take 1 tablet at bedtime  #90 x prn   Entered and Authorized by:   Michele Mcalpine MD   Signed by:   Michele Mcalpine MD on 12/28/2009   Method used:   Print then Give to Patient   RxID:   [id number] NORVASC 10 MG  TABS (AMLODIPINE BESYLATE) Take 1 tablet by mouth once a day  #90 x prn   Entered and Authorized by:   Michele Mcalpine MD   Signed by:   Michele Mcalpine MD on 12/28/2009   Method used:   Print then Give to Patient   RxID:   [id number] FEXOFENADINE HCL 180 MG TABS (FEXOFENADINE HCL) Take 1 tablet by mouth once a day as needed for allergies...  #90 x prn   Entered and Authorized by:   Michele Mcalpine MD   Signed by:   Michele Mcalpine MD on 12/28/2009   Method used:   Print then Give to Patient   RxID:   [id number]    Immunizations  Administered:  Tetanus Vaccine:    Vaccine Type: Tdap    Site: right deltoid    Mfr: boostrix    Dose: 0.5 ml    Route: IM    Given by: Randell Loop CMA    Exp. Date: 12/31/2011    Lot #: [id number]    VIS given: 08/26/07 version given December 28, 2009.

## 2010-11-09 NOTE — Progress Notes (Signed)
Summary: rx refill / pharm calling  Phone Note From Pharmacy   Caller: Patient Call For: nadel Summary of Call: needs 5 days supply of zocor 40mg  called in to  Caller: tonya- [facility] [place] Call For: nadel  Summary of Call: pt needs 5 days rx for zocor called in to [facility] [phone] Initial call taken by: Tivis Ringer, CNA,  December 12, 2009 9:07 AM  Follow-up for Phone Call        5 day supply of Simvastatin 40mg  called in to [facility]. Abigail Miyamoto RN  December 12, 2009 9:41 AM     Prescriptions: SIMVASTATIN 40 MG  TABS (SIMVASTATIN) take 1 tablet at bedtime  #5 x 0   Entered by:   Abigail Miyamoto RN   Authorized by:   Michele Mcalpine MD   Signed by:   Abigail Miyamoto RN on 12/12/2009   Method used:   Historical   RxID:   [id number]

## 2010-11-09 NOTE — Letter (Signed)
Summary: EGD Instructions  [facility]  [address] [place], [place] [address]   Phone: [phone]  Fax: [phone]       Jenny Madden    [birth year]    MRN: [id number]       Procedure Day /Date:_2/11/2010  EGD with Dilation     Arrival Time: _1:30PM     Procedure Time:_2:30PM     Location of Procedure:                     _X  _ [facility] ( Outpatient Registration) _   PREPARATION FOR ENDOSCOPY   On_2/11/2010  _ THE DAY OF THE PROCEDURE:  1.   No solid foods, milk or milk products are allowed after midnight the night before your procedure.  2.   Do not drink anything colored red or purple.  Avoid juices with pulp.  No orange juice.  3.  You may drink clear liquids until 10:30AM, which is 4 hours before your procedure.                                                                                                CLEAR LIQUIDS INCLUDE: Water Jello Ice Popsicles Tea (sugar ok, no milk/cream) Powdered fruit flavored drinks Coffee (sugar ok, no milk/cream) Gatorade Juice: apple, white grape, white cranberry  Lemonade Clear bullion, consomm, broth Carbonated beverages (any kind) Strained chicken noodle soup Hard Candy   MEDICATION INSTRUCTIONS  Unless otherwise instructed, you should take regular prescription medications with a small sip of water as early as possible the morning of your procedure.  Diabetic patients - see separate instructions.  Stop taking Plavix or Aggrenox on  _  (7 days before procedure).     Stop taking Coumadin on  _  (5 days before procedure).  Additional medication instructions: _             OTHER INSTRUCTIONS  You will need a responsible adult at least 71 years of age to accompany you and drive you home.   This person must remain in the waiting room during your procedure.  Wear loose fitting clothing that is easily removed.  Leave jewelry and other valuables at home.  However, you may wish to  bring a book to read or an iPod/MP3 player to listen to music as you wait for your procedure to start.  Remove all body piercing jewelry and leave at home.  Total time from sign-in until discharge is approximately 2-3 hours.  You should go home directly after your procedure and rest.  You can resume normal activities the day after your procedure.  The day of your procedure you should not:   Drive   Make legal decisions   Operate machinery   Drink alcohol   Return to work  You will receive specific instructions about eating, activities and medications before you leave.

## 2010-11-09 NOTE — Assessment & Plan Note (Signed)
Summary: Acute NP office visit - HTN   Primary Provider/Referring Provider:  Alroy Dust, MD  CC:  HA's x2weeks and left side of neck feels swollen and left ear discomfort x2days - states HAs began when she started drinking green tea with ginseng.Jenny Madden  History of Present Illness: 70 y/o BF with known history of HTN, DM, and Hyperlipidemia.     ~  December 28, 2008:  she is c/o feeling "puffy" in her abd and is considering taking "Digest-Zyme" from her chiropractor... we discussed poss alternatives such as Activia yogurt and Align probiotics... her weight = 180# but she objects "it's just 178 on my home scale", and she is happy w/ her current weight, not motivated to reduce "I'd look terrible"... BS's= 100-115 OK... she had some dysphagia & saw DrJacobs w/ EGD showing Schatzki ring w/ balloon dilatation- improved...  March 17, 2009--Pt presents for acute office visit. Complains of Pt c/o pressure in lower back x 1 week and pressure in lower abdomen x 2 days. Pt states changes in BM's.mild constiipation-hard stools. , increased gas.      ~  June 28, 2009:  67mo f/u doing well- just notes some mild constipation & we discussed options for Miralax/ Senakot-S/ etc... BP controlled- denies CP, palpit, etc...  November 22, 2009--Presents for acute office visit. HA's x2weeks, left side of neck feels swollen and left ear discomfort x2days - states HAs began when she started drinking green tea with ginseng. Stopped ginseng 2 weeks ago, HA got better. No otc used. no speech/visual changes. Denies chest pain, dyspnea, orthopnea, hemoptysis, fever, n/v/d, edema, headache.       Medications Prior to Update: 1)  Fexofenadine Hcl 180 Mg Tabs (Fexofenadine Hcl) .... Take 1 Tablet By Mouth Once A Day As Needed For Allergies.Jenny KitchenMarland Madden 2)  Adult Aspirin Ec Low Strength 81 Mg  Tbec (Aspirin) .... Take 1 Tablet By Mouth Once A Day 3)  Norvasc 10 Mg  Tabs (Amlodipine Besylate) .... Take 1 Tablet By Mouth Once A Day 4)   Simvastatin 40 Mg  Tabs (Simvastatin) .... Take 1 Tablet At Bedtime 5)  Metformin Hcl 500 Mg  Tabs (Metformin Hcl) .... Take 1 Tablet Two Times A Day 6)  Prilosec Otc 20 Mg Tbec (Omeprazole Magnesium) .... Take 1 Tab By Mouth Once Daily (30 Min Before A Meal).Jenny KitchenMarland Madden 7)  Onetouch Ultra Test  Strp (Glucose Blood) .... Test Blood Sugar As Directed 8)  Onetouch Ultra System W/device Kit (Blood Glucose Monitoring Suppl) .... Use As Directed 9)  Accu-Chek Aviva  Strp (Glucose Blood) .... Test One Time A Day 10)  Diazepam 5 Mg Tabs (Diazepam) .... 1/2 - 1 Tabs By Mouth Two Times A Day As Needed For Nerves  Allergies (verified): No Known Drug Allergies  Past History:  Family History: Last updated: 2008-12-12 mother deceased from accident--age unknown father deceased from accident--age unknown 1 sibling alive age 32 hx of diabetes: Brother x 2 1 sibling alive age 43  hx of colon cancer : Brother 1 sibling alive age 67  hx of arthritis 1 sibling alive age 30  hx of diabetes  Social History: Last updated: 12/28/2008 Married 1 child Patient is a former smoker. -stopped 10 years ago drinks socially exercises 3 times per wk drinks caffeine  1-2 cups per day  Risk Factors: Smoking Status: quit (12-12-2008)  Past Medical History:  ALLERGIC RHINITIS (ICD-477.9) - she uses Allegra Prn...  HYPERTENSION (ICD-401.9) - controlled on NORVASC 10mg /d and takes ASA 81mg /d as well.Jenny KitchenMarland Madden  ALLERGIC RHINITIS (ICD-477.9) - she uses Allegra Prn...  HYPERTENSION (ICD-401.9) - controlled on NORVASC 10mg /d and takes ASA 81mg /d as well...    ABNORMAL ELECTROCARDIOGRAM (ICD-794.31) & ECHOCARDIOGRAM, ABNORMAL (ICD-793.2) - she is asymtomatic without CP, palpit, SOB, dizziness or syncope... she is active w/ walking, treadmill, etc...  ~  baseline EKG showed NSR, poor R progression V1-3 w/ NSSTTWA... no change in f/u tracings.  ~  2DEcho in 2006 showed only some mild SAM, tr MR, tr AI, vigorous LVF w/ sm cavity &  EF=75% (no change from 2002)...  ~  2DEcho 2/09 showed:  Mod thickening of the MV leaflets, & mild calcif of the AoV... The left ventricle was mildly dilated. Overall left ventricular systolic function was normal. Left ventricular ejection fraction was estimated to be 60 %. There were no left ventricular regional wall motion abnormalities. Left ventricular wall thickness was mildly increased. There was mild focal basal septal hypertrophy. There was no systolic anterior motion of the mitral valve. There was Doppler evidence for dynamic left ventricular mid-cavity obstruction during Valsalva, with a peak velocity of 2.1 m/sec , and with a peak gradient of 18 mmHg. Doppler parameters  consistent with abnormal left ventricular relaxation.  HYPERCHOLESTEROLEMIA (ICD-272.0) - on SIMVASTATIN 40mg /d.  ~  FLP 7/08 showed TChol 165, Tg 65, HDL 45, LDL 107  ~  FLP 2/09 showed TChol 163, Tg 66, HDL 51, LDL 98... continue same.  ~  FLP 8/09 showed TChol 144, TG 63, HDL 52, LDL 80  ~  FLP 9/10 showed TChol 158, TG 60, HDL 47, LDL 99  DM (ICD-250.00) - doing well on METFORMIN 500mg Bid + diet... BS at home all  ~100-115 per pt.   ~  labs 8/09 showed BS= 111, HgA1c= 6.4.Jenny KitchenMarland Madden  Past Surgical History: S/P C Section S/P Bilat foot surg for hammer toes     Review of Systems      See HPI  Vital Signs:  Patient profile:   70 year old female Height:      64 inches Weight:      181.50 pounds BMI:     31.27 O2 Sat:      98 % on Room air Temp:     98.4 degrees F oral Pulse rate:   85 / minute BP sitting:   128 / 82  (left arm) Cuff size:   regular  Vitals Entered By: Boone Master CNA (November 22, 2009 9:34 AM)  O2 Flow:  Room air CC: HA's x2weeks, left side of neck feels swollen and left ear discomfort x2days - states HAs began when she started drinking green tea with ginseng. Is Patient Diabetic? Yes Comments Medications reviewed with patient Daytime contact number verified with patient. Boone Master  CNA  November 22, 2009 9:34 AM    Physical Exam  Additional Exam:  WD, WN, 70 y/o BF in NAD...  GENERAL:  Alert & oriented; pleasant & cooperative... HEENT:  [place]/AT, EACs-clear, TMs-wnl, NOSE-clear, THROAT-clear & wnl. NECK:  Supple w/ full ROM; no JVD; normal carotid impulses w/o bruits; no thyromegaly or nodules palpated; no lymphadenopathy. CHEST:  Clear to P & A; without wheezes/ rales/ or rhonchi. HEART:  Regular Rhythm, gr 1/6 SEM without rubs or gallops heard... ABDOMEN:  Soft & nontender, mild panniculus, normal bowel sounds; no organomegaly or masses detected, neg guarding,  EXT: without deformities, mild arthritic changes; no varicose veins/ venous insuffic/ or edema.  Neuro: a/ox 3 , nml grips bilaterally, CN 2-12 intact, nml gait, equal strength  bilaterally. neg pronator drift, neg rhomberg PERRLA, EOMI w/o nystagmus    Impression & Recommendations:  Problem # 1:  HYPERTENSION (ICD-401.9)  void ginseng, green tea, decongestants.  Low salt diet.   Stress reducers,  Please contact office for sooner follow up if symptoms do not improve or worsen  follow up Dr. Kriste Basque in 1 month as scheduled.  Her updated medication list for this problem includes:    Norvasc 10 Mg Tabs (Amlodipine besylate) .Jenny Madden... Take 1 tablet by mouth once a day  Orders: Est. Patient Level IV ([address]109)  Problem # 2:  ALLERGIC RHINITIS (ICD-477.9)  ear pain, exam unrevealing would check w/ dentist regarding bridge- she has been having trouble with them.  Her updated medication list for this problem includes:    Fexofenadine Hcl 180 Mg Tabs (Fexofenadine hcl) .Jenny Madden... Take 1 tablet by mouth once a day as needed for allergies...  Orders: Est. Patient Level IV (60454)  Medications Added to Medication List This Visit: 1)  Prilosec Otc 20 Mg Tbec (Omeprazole magnesium) .... Take 1 tab by mouth once daily as needed  (30 min before a meal)...  Complete Medication List: 1)  Fexofenadine Hcl 180 Mg Tabs  (Fexofenadine hcl) .... Take 1 tablet by mouth once a day as needed for allergies.Jenny KitchenMarland Madden 2)  Adult Aspirin Ec Low Strength 81 Mg Tbec (Aspirin) .... Take 1 tablet by mouth once a day 3)  Norvasc 10 Mg Tabs (Amlodipine besylate) .... Take 1 tablet by mouth once a day 4)  Simvastatin 40 Mg Tabs (Simvastatin) .... Take 1 tablet at bedtime 5)  Metformin Hcl 500 Mg Tabs (Metformin hcl) .... Take 1 tablet two times a day 6)  Prilosec Otc 20 Mg Tbec (Omeprazole magnesium) .... Take 1 tab by mouth once daily as needed  (30 min before a meal).Jenny KitchenMarland Madden 7)  Onetouch Ultra Test Strp (Glucose blood) .... Test blood sugar as directed 8)  Onetouch Ultra System W/device Kit (Blood glucose monitoring suppl) .... Use as directed 9)  Accu-chek Aviva Strp (Glucose blood) .... Test one time a day 10)  Diazepam 5 Mg Tabs (Diazepam) .... 1/2 - 1 tabs by mouth two times a day as needed for nerves  Patient Instructions: 1)  Avoid ginseng, green tea, decongestants.  2)  Low salt diet.  3)  Check with dentist regarding bridge.  4)  Stress reducers, tylenol as needed pain/headache.  5)  Please contact office for sooner follow up if symptoms do not improve or worsen  6)  follow up Dr. Kriste Basque in 1 month as scheduled.

## 2010-11-09 NOTE — Progress Notes (Signed)
Summary: Supporting lab dx codes  ---- Converted from flag ---- ---- 07/07/2010 5:46 PM, Michele Mcalpine MD wrote: use 733.90 and 268.9.Marland KitchenMarland Kitchen SN  ---- 07/07/2010 2:46 PM, Leodis Liverpool wrote: Please send supporting dx code for Vit D from 06/28/10. [facility] denied 401.9,272.0,250.0,530.81 Thanks. Darl Pikes ------------------------------

## 2010-11-09 NOTE — Letter (Signed)
Summary: Diabetic Instructions  [facility]  [address] [place], [place] [address]   Phone: [phone]  Fax: [phone]    LLUVIA GWYNNE [birth year] MRN: [id number]   _ X _   ORAL DIABETIC MEDICATION INSTRUCTIONS  The day before your procedure:   Take your diabetic pill as you do normally  The day of your procedure:   Do not take your diabetic pill    We will check your blood sugar levels during the admission process and again in Recovery before discharging you home  ________________________________________________________________________  _  _   INSULIN (LONG ACTING) MEDICATION INSTRUCTIONS (Lantus, NPH, 70/30, Humulin, Novolin-N)   The day before your procedure:   Take  your regular evening dose    The day of your procedure:   Do not take your morning dose    _  _   INSULIN (SHORT ACTING) MEDICATION INSTRUCTIONS (Regular, Humulog, Novolog)   The day before your procedure:   Do not take your evening dose   The day of your procedure:   Do not take your morning dose   _  _   INSULIN PUMP MEDICATION INSTRUCTIONS  We will contact the physician managing your diabetic care for written dosage instructions for the day before your procedure and the day of your procedure.  Once we have received the instructions, we will contact you.

## 2010-11-09 NOTE — Letter (Signed)
Summary: [facility]  [facility]   Imported By: Sherian Rein 02/10/2010 09:26:13  _____________________________________________________________________  External Attachment:    Type:   Image     Comment:   External Document

## 2010-11-09 NOTE — Progress Notes (Signed)
Summary: bp   Phone Note Call from Patient Call back at Home Phone [phone]   Caller: Patient Call For: Melady Chow Reason for Call: Talk to Nurse Summary of Call: BP today 128/88,  concerned about lower number of bp Initial call taken by: Eugene Gavia,  November 16, 2009 8:21 AM  Follow-up for Phone Call        called, spoke with pt.  Pt states she was taking gensing with green tea and noticed DBP jumped to the 90's while taking that med.  States she stopped taking it approx 2 weeks ago and DBP is now in the 80's but states it was staying in the 70's before.  States BP was128/88 this morning and now 130/84.  Informed her that DBP in the 80's is not bad.  states she is stil taking norvasc 10mg /day.  Informed her to cont with the norvasc and cont to check her BP and if it starts to rise in the 90's again to call office back.  She is ok with this. She has an ov scheduled for March 23.  Will forward message to SN-please advise if you would like me to tell pt any thing further.  Thanks! Follow-up by: Gweneth Dimitri RN,  November 16, 2009 9:40 AM  Additional Follow-up for Phone Call Additional follow up Details #1::        per SN----she will need to give this a little more time to get out of her system--the gensing with green tea---also stay away from the salt--sodium.  thanks Randell Loop CMA  November 16, 2009 11:48 AM     Additional Follow-up for Phone Call Additional follow up Details #2::    [facility] Gweneth Dimitri RN  November 16, 2009 11:52 AM   Pt returned the call; aware of SN's recs.Reynaldo Minium CMA  November 16, 2009 12:36 PM

## 2010-11-09 NOTE — Progress Notes (Signed)
Summary: prescript-AWAITING FORM FROM [facility]  Phone Note Call from Patient Call back at [phone]   Caller: Patient Call For: nadel Summary of Call: need pres[facility] for one touch ultra script  Initial call taken by: Rickard Patience,  January 16, 2010 4:39 PM  Follow-up for Phone Call        Spoke with pt.  She states that we need to call her insurance company to get her test strips filled.  I called [facility] and was told that pt tried to fill med too soon.  I initiated PA over the phone to see if we can get test strips filled now.  Will await form to be faxed to triage. Vernie Murders  January 16, 2010 5:08 PM   Additional Follow-up for Phone Call Additional follow up Details #1::        Received fax from pharmacy stating "no PA req for onetouch, rej for refill too soon, accu check has the same gpi as the onetouch test strips, pharmacy needs to contact HD. tried contacting [facility], no answer."  I explained to pt that filling the Accu-check and then filling the One Touch is considered the same Rx and insurance is not going to pay for early fill. Pt expressed her understanding and asked if SN can writed her a new Rx for Accu-check to test two times a day. Please advise. Zackery Barefoot CMA  January 17, 2010 9:26 AM     Additional Follow-up for Phone Call Additional follow up Details #2::    called and spoke with pt and she is aware that per SN and medicare---her insurance will only cover her to check the BS once daily since she is very controlled and her numbers look good.  pt voiced her understanding of this and will call for any questions or concerns Randell Loop CMA  January 17, 2010 3:24 PM

## 2010-11-09 NOTE — Assessment & Plan Note (Signed)
Summary: rov 6 months///kp   Primary Care Provider:  Alroy Dust, MD  CC:  6 month ROV & review of mult medical problems....  History of Present Illness: 70 y/o BF here for a follow up visit and on-going management of mult med problems including- HBP, DM, Chol, etc...    ~  Mar10:  she is c/o feeling "puffy" in her abd and is considering taking "Digest-Zyme" from her chiropractor... we discussed poss alternatives such as Activia yogurt and Align probiotics... her weight = 180# but she objects "it's just 178 on my home scale", and she is happy w/ her current weight, not motivated to reduce "I'd look terrible"... BS's= 100-115 OK... she had some dysphagia & saw DrJacobs w/ EGD showing Schatzki ring w/ balloon dilatation- improved...  ~  Sep10:  67mo f/u doing well- just notes some mild constipation & we discussed options for Miralax/ Senakot-S/ etc... BP controlled- denies CP, palpit, etc...   ~  December 28, 2009:  67mo f/u doing well- no new complaints or concerns, notes rare palpit, no CP, no SOB, no edema... she saw TP 2/11 w/ HA and BP elevated but now she says this was a reaction to "ginsing tea"> no recurrence & BP controlled on Norvasc... requests refills w/ 90d supplies.   ~  June 28, 2010:  she has lost 4# on her diet, feeling well w/o new complaints or concerns... BP controlled on Norvasc w/ ave 120s/ 70s at home;  denies CP, palpit, SOB, edema, etc;  Chol is reasonable on the Simva40 & tol well w/o GI or muscle symptoms;  similarly her BS remains under good control w/ the Metformin & she reports FBS ~100 at home...  she requests 90d refills and Flu shot today.    Current Problem List:  ALLERGIC RHINITIS (ICD-477.9) - she uses Allegra Prn...  HYPERTENSION (ICD-401.9) - controlled on NORVASC 10mg /d and takes ASA 81mg /d as well...  BP= 134/82 today & similar at home... tolerated well and denies HA, fatigue, visual changes, CP, palipit, dizziness, syncope, dyspnea, edema,  etc...  ABNORMAL ELECTROCARDIOGRAM (ICD-794.31) & ECHOCARDIOGRAM, ABNORMAL (ICD-793.2) - she is asymtomatic without CP, palpit, SOB, dizziness or syncope... she is active w/ walking, treadmill, etc...  ~  baseline EKG showed NSR, poor R progression V1-3 w/ NSSTTWA... no change in f/u tracings.  ~  2DEcho in 2006 showed only some mild SAM, tr MR, tr AI, vigorous LVF w/ sm cavity & EF=75% (no change from 2002)...  ~  2DEcho 2/09 showed:  Mod thickening of the MV leaflets, & mild calcif of the AoV... The left ventricle was mildly dilated. Overall left ventricular systolic function was normal. Left ventricular ejection fraction was estimated to be 60 %. There were no left ventricular regional wall motion abnormalities. Left ventricular wall thickness was mildly increased. There was mild focal basal septal hypertrophy. There was no systolic anterior motion of the mitral valve. There was Doppler evidence for dynamic left ventricular mid-cavity obstruction during Valsalva, with a peak velocity of 2.1 m/sec , and with a peak gradient of 18 mmHg. Doppler parameters were consistent with abnormal left ventricular relaxation.  HYPERCHOLESTEROLEMIA (ICD-272.0) - on SIMVASTATIN 40mg /d + diet efforts.  ~  FLP 7/08 showed TChol 165, Tg 65, HDL 45, LDL 107  ~  FLP 2/09 showed TChol 163, Tg 66, HDL 51, LDL 98... continue same.  ~  FLP 8/09 showed TChol 144, TG 63, HDL 52, LDL 80  ~  FLP 9/10 showed TChol 158, TG 60, HDL  47, LDL 99  ~  FLP 3/11 showed TChol 162, TG 63, HDL 53, LDL 97  ~  FLP 9/11 showed TChol 169, TG 58, HDL 45, LDL 113  DM (ICD-250.00) - doing well on METFORMIN 500mg Bid + diet... BS at home all  ~100-115 per pt.  ~  labs 7/08 showed BS=111, HgA1c=6.1  ~  labs 2.09 showed BS= 112, HgA1c= 6.3.Marland KitchenMarland Kitchen continue same.  ~  labs 8/09 showed BS= 111, HgA1c= 6.4  ~  labs 9/10 showed BS= 106, A1c= not done.  ~  labs 3/11 showed BS= 93, A1c= 6.1  ~  labs 9/11 showed BS= 95  GERD (ICD-530.81) - she had EGD in  2002 showing 2cm HH, gastritis, duodenitis... controlled on OMEPRAZOLE 20mg /d...  ~  EGD 2/10 by DrJacobs showed HH/ Schatzki ring w/ balloon dilatation...  DIVERTICULOSIS OF COLON (ICD-562.10) & COLONIC POLYPS (ICD-211.3) - last colonoscopy was 1/08 by DrSam showing divertics only... f/u 35yrs planned due to [facility] w/ colon Ca...  DEGENERATIVE JOINT DISEASE, GENERALIZED (ICD-715.00)  VIT D DEFICIENCY - Vit D level 9/11= 23... rec to start 2000 u OTC Vit D supplement daily...  HEADACHES, HX OF (ICD-V13.8) - CTBrain in 2007 w/ some atrophy.  Hx of TINNITUS (ICD-388.30)  ANXIETY (ICD-300.00) - on ALPRAZOLAM 0.5mg  Prn...  Health Maintenance:  ~  GI= DrJacobs as above...  ~  GYN= sees DrMezer since DrFore passed away- PAP, Mammogram & BMD thru his office.  ~  Immunizations:  yearly flu shots... PNEUMOVAX 9/10 at age 78... TDAP given 3/11...   Preventive Screening-Counseling & Management  Alcohol-Tobacco     Smoking Status: quit     Year Quit: 2001  Comments: only smoked 3-4 cigs per day  Allergies (verified): No Known Drug Allergies  Comments:  Nurse/Medical Assistant: The patient's medications and allergies were reviewed with the patient and were updated in the Medication and Allergy Lists.  Past History:  Past Medical History: ALLERGIC RHINITIS (ICD-477.9) HYPERTENSION (ICD-401.9) ABNORMAL ELECTROCARDIOGRAM (ICD-794.31) ECHOCARDIOGRAM, ABNORMAL (ICD-793.2) HYPERCHOLESTEROLEMIA (ICD-272.0) DM (ICD-250.00) GERD (ICD-530.81) DYSPHAGIA (ICD-787.29) DIVERTICULOSIS OF COLON (ICD-562.10) COLONIC POLYPS (ICD-211.3) FAMILY HX COLON CANCER (ICD-V16.0) DEGENERATIVE JOINT DISEASE, GENERALIZED (ICD-715.00) HEADACHES, HX OF (ICD-V13.8) Hx of TINNITUS (ICD-388.30) ANXIETY (ICD-300.00)  Past Surgical History: S/P C Section S/P Bilat foot surg for hammer toes  Family History: Reviewed history from 12/28/2009 and no changes required. Father died age 62 w/ lung cancer, ASHD,  stroke. Mother died age 63 w/ MI, HBP. 7 Siblings:  4 Brothers- one w/ prostate cancer 3 Sisters- one died w/ colon cancer  Social History: Reviewed history from 12/28/2009 and no changes required. Married, husb= Stepney, 72yrs 3 Children- 1 son w/ hodgkins, 2 daugh alive & well. Ex-smoker, quit 10 years ago No alcohol High School education employ as cook  Review of Systems      See HPI  The patient denies anorexia, fever, weight loss, weight gain, vision loss, decreased hearing, hoarseness, chest pain, syncope, dyspnea on exertion, peripheral edema, prolonged cough, headaches, hemoptysis, abdominal pain, melena, hematochezia, severe indigestion/heartburn, hematuria, incontinence, muscle weakness, suspicious skin lesions, transient blindness, difficulty walking, depression, unusual weight change, abnormal bleeding, enlarged lymph nodes, and angioedema.    Vital Signs:  Patient profile:   70 year old female Height:      64 inches Weight:      175.13 pounds O2 Sat:      97 % on Room air Temp:     98.6 degrees F oral Pulse rate:   87 / minute BP  sitting:   134 / 82  (right arm) Cuff size:   regular  Vitals Entered By: Randell Loop CMA (June 28, 2010 9:23 AM)  O2 Sat at Rest %:  97 O2 Flow:  Room air CC: 6 month ROV & review of mult medical problems... Is Patient Diabetic? Yes Pain Assessment Patient in pain? no      Comments no changes in meds today   Physical Exam  Additional Exam:  WD, WN, 70 y/o BF in NAD...  GENERAL:  Alert & oriented; pleasant & cooperative... HEENT:  [place]/AT, EOM-wnl, PERRLA, EACs-clear, TMs-wnl, NOSE-clear, THROAT-clear & wnl. NECK:  Supple w/ fairROM; no JVD; normal carotid impulses w/o bruits; no thyromegaly or nodules palpated; no lymphadenopathy. CHEST:  Clear to P & A; without wheezes/ rales/ or rhonchi. HEART:  Regular Rhythm, gr 1/6 SEM without rubs or gallops heard... ABDOMEN:  Soft & nontender, mild panniculus, normal bowel sounds; no  organomegaly or masses detected. EXT: without deformities, mild arthritic changes; no varicose veins/ venous insuffic/ or edema. NEURO:  CN's intact;  no focal neuro deficits... DERM:  No lesions noted; no rash etc...    CXR  Procedure date:  06/28/2010  Findings:      CHEST - 2 VIEW Comparison: 06/28/2001   Findings: The heart size is stable and normal.  Normal mediastinal and hilar contours and pulmonary vascularity.  The lungs are clear. There is convex right scoliosis of the mid to lower thoracic spine. No acute bony abnormality.   IMPRESSION: 1.  No acute cardiopulmonary disease. 2.  Scoliosis   Read By:  Oliver Hum,  M.D.   MISC. Report  Procedure date:  06/28/2010  Findings:      BMP (METABOL)   Sodium                    141 mEq/L                   135-145   Potassium                 4.4 mEq/L                   3.5-5.1   Chloride                  105 mEq/L                   96-112   Carbon Dioxide            28 mEq/L                    19-32   Glucose                   95 mg/dL                    16-10   BUN                       10 mg/dL                    9-60   Creatinine                0.8 mg/dL                   4.5-4.0   Calcium  9.9 mg/dL                   8.2-95.6   GFR                       88.67 mL/min                >60  Hepatic/Liver Function Panel (HEPATIC)   Total Bilirubin           0.3 mg/dL                   2.1-3.0   Direct Bilirubin          0.1 mg/dL                   8.6-5.7   Alkaline Phosphatase      60 U/L                      39-117   AST                       20 U/L                      0-37   ALT                       16 U/L                      0-35   Total Protein             6.8 g/dL                    8.4-6.9   Albumin                   4.0 g/dL                    6.2-9.5  CBC Platelet w/Diff (CBCD)   White Cell Count          5.3 K/uL                    4.5-10.5   Red Cell Count            4.50 Mil/uL                  3.87-5.11   Hemoglobin                12.6 g/dL                   28.4-13.2   Hematocrit                37.5 %                      36.0-46.0   MCV                       83.4 fl                     78.0-100.0   Platelet Count            241.0 K/uL                  150.0-400.0   Neutrophil %  48.0 %                      43.0-77.0   Lymphocyte %              43.2 %                      12.0-46.0   Monocyte %                7.9 %                       3.0-12.0   Eosinophils%              0.3 %                       0.0-5.0   Basophils %               0.6 %                       0.0-3.0  Comments:      Lipid Panel (LIPID)   Cholesterol               169 mg/dL                   1-191   Triglycerides             58.0 mg/dL                  4.7-829.5   HDL                       62.13 mg/dL                 >08.65   LDL Cholesterol      [H]  784 mg/dL                   6-96  TSH (TSH)   FastTSH                   1.31 uIU/mL                 0.35-5.50   UDip w/Micro (URINE)   Color                     YELLOW   Clarity                   CLEAR                       Clear   Specific Gravity          1.015                       1.000 - 1.030   Urine Ph                  7.0                         5.0-8.0   Protein                   NEGATIVE                    Negative   Urine  Glucose             NEGATIVE                    Negative   Ketones                   NEGATIVE                    Negative   Urine Bilirubin           NEGATIVE                    Negative   Blood                     NEGATIVE                    Negative   Urobilinogen              1.0                         0.0 - 1.0   Leukocyte Esterace        NEGATIVE                    Negative   Nitrite                   NEGATIVE                    Negative   Urine WBC                 0-2/hpf                     0-2/hpf   Urine Mucus               Presence of                 None   Urine Epith                Rare(0-4/hpf)               Rare(0-4/hpf)   Urine Bacteria            Rare(<10/hpf)               None  Vitamin D (25-Hydroxy) (16109)  Vitamin D (25-Hydroxy)                        [L]  23 ng/mL                    30-89   Impression & Recommendations:  Problem # 1:  PHYSICAL EXAMINATION (ICD-V70.0) She is stable w/ problems listed>  same meds, continue diet efforts... Orders: 12 Lead EKG (12 Lead EKG) T-2 View CXR (71020TC) TLB-BMP (Basic Metabolic Panel-BMET) (80048-METABOL) TLB-Hepatic/Liver Function Pnl (80076-HEPATIC) TLB-CBC Platelet - w/Differential (85025-CBCD) TLB-Lipid Panel (80061-LIPID) TLB-TSH (Thyroid Stimulating Hormone) (84443-TSH) TLB-Udip w/ Micro (81001-URINE) T-Vitamin D (25-Hydroxy) (60454-09811)  Problem # 2:  HYPERTENSION (ICD-401.9) Controlled on the CCB>  continue same. Her updated medication list for this problem includes:    Norvasc 10 Mg Tabs (Amlodipine besylate) .Marland Kitchen... Take 1 tablet by mouth once a day  Problem # 3:  ABNORMAL ELECTROCARDIOGRAM (ICD-794.31) She is asymptomatic... EKG w/o change...  Problem # 4:  HYPERCHOLESTEROLEMIA (  ICD-272.0) FLP resaonably stable on the Simva40>  tol well, continue same. Her updated medication list for this problem includes:    Simvastatin 40 Mg Tabs (Simvastatin) .Marland Kitchen... Take 1 tablet at bedtime  Problem # 5:  DM (ICD-250.00) She continues to do well w/ Metformin + diet efforts... Her updated medication list for this problem includes:    Adult Aspirin Ec Low Strength 81 Mg Tbec (Aspirin) .Marland Kitchen... Take 1 tablet by mouth once a day    Metformin Hcl 500 Mg Tabs (Metformin hcl) .Marland Kitchen... Take 1 tablet two times a day  Problem # 6:  GERD (ICD-530.81) GI is stable & up to date... continue same meds. Her updated medication list for this problem includes:    Omeprazole 20 Mg Cpdr (Omeprazole) .Marland Kitchen... Take 1 cap by mouth once daily- 30 min before a meal...  Problem # 7:  DEGENERATIVE JOINT DISEASE, GENERALIZED  (ICD-715.00) She denies arthritis symptoms at this time... Her updated medication list for this problem includes:    Adult Aspirin Ec Low Strength 81 Mg Tbec (Aspirin) .Marland Kitchen... Take 1 tablet by mouth once a day  Problem # 8:  ANXIETY (ICD-300.00) She has Xanax for Prn use... Her updated medication list for this problem includes:    Alprazolam 0.5 Mg Tabs (Alprazolam) .Marland Kitchen... Take 1/2 to 1 tab by mouth three times a day as needed for nerves...  Problem # 9:  VIT D DEFICIENCY>>> Vit D level = 23... rec OTC vit d supplement  ~2000 u daily...  Complete Medication List: 1)  Fexofenadine Hcl 180 Mg Tabs (Fexofenadine hcl) .... Take 1 tablet by mouth once a day as needed for allergies.Marland KitchenMarland Kitchen 2)  Adult Aspirin Ec Low Strength 81 Mg Tbec (Aspirin) .... Take 1 tablet by mouth once a day 3)  Norvasc 10 Mg Tabs (Amlodipine besylate) .... Take 1 tablet by mouth once a day 4)  Simvastatin 40 Mg Tabs (Simvastatin) .... Take 1 tablet at bedtime 5)  Metformin Hcl 500 Mg Tabs (Metformin hcl) .... Take 1 tablet two times a day 6)  Omeprazole 20 Mg Cpdr (Omeprazole) .... Take 1 cap by mouth once daily- 30 min before a meal... 7)  Alprazolam 0.5 Mg Tabs (Alprazolam) .... Take 1/2 to 1 tab by mouth three times a day as needed for nerves... 8)  Accu-chek Aviva Strp (Glucose blood) .... Use as directed 9)  Accu-chek Aviva Kit (Blood glucose monitoring suppl) .... Use as directed 10)  Accu-chek Aviva Soln (Blood glucose calibration)  Other Orders: Flu Vaccine 59yrs + MEDICARE PATIENTS (W1191) Administration Flu vaccine - MCR (Y7829)  Patient Instructions: 1)  Today we updated your med list- see below.... 2)  We refilled your meds per request... 3)  Today we did your follow up CXR, EKG, & fasting blood work... please call the "phone tree" in a few days for your lab results.Marland KitchenMarland Kitchen 4)  We also gave you the 2011 Flu vaccine... 5)  Stay on your diet & work on losing a few lbs;  continue your exercise program as well... 6)   Call for any questions.Marland KitchenMarland Kitchen 7)  Please schedule a follow-up appointment in 6 months. Prescriptions: ALPRAZOLAM 0.5 MG TABS (ALPRAZOLAM) take 1/2 to 1 tab by mouth three times a day as needed for nerves...  #100 x 4   Entered and Authorized by:   Michele Mcalpine MD   Signed by:   Michele Mcalpine MD on 06/28/2010   Method used:   Print then Give to Patient   RxID:  [id number] OMEPRAZOLE 20 MG CPDR (OMEPRAZOLE) take 1 cap by mouth once daily- 30 min before a meal...  #90 x 4   Entered and Authorized by:   Michele Mcalpine MD   Signed by:   Michele Mcalpine MD on 06/28/2010   Method used:   Print then Give to Patient   RxID:   [id number] METFORMIN HCL 500 MG  TABS (METFORMIN HCL) take 1 tablet two times a day  #180 x 4   Entered and Authorized by:   Michele Mcalpine MD   Signed by:   Michele Mcalpine MD on 06/28/2010   Method used:   Print then Give to Patient   RxID:   [id number] SIMVASTATIN 40 MG  TABS (SIMVASTATIN) take 1 tablet at bedtime  #90 x 4   Entered and Authorized by:   Michele Mcalpine MD   Signed by:   Michele Mcalpine MD on 06/28/2010   Method used:   Print then Give to Patient   RxID:   [id number] NORVASC 10 MG  TABS (AMLODIPINE BESYLATE) Take 1 tablet by mouth once a day  #90 x 4   Entered and Authorized by:   Michele Mcalpine MD   Signed by:   Michele Mcalpine MD on 06/28/2010   Method used:   Print then Give to Patient   RxID:   [id number]    Flu Vaccine Consent Questions     Do you have a history of severe allergic reactions to this vaccine? no    Any prior history of allergic reactions to egg and/or gelatin? no    Do you have a sensitivity to the preservative Thimersol? no    Do you have a past history of Guillan-Barre Syndrome? no    Do you currently have an acute febrile illness? no    Have you ever had a severe reaction to latex? no    Vaccine information given and explained to patient? yes    Are you currently pregnant? no    Lot Number:[id number]   Exp  Date:04/07/2011   Site Given  Left Deltoid IMlu Gweneth Dimitri RN  June 28, 2010 10:32 AM

## 2010-11-09 NOTE — Progress Notes (Signed)
Summary: meds/ leg cramps  Phone Note Call from Patient Call back at Home Phone [phone]   Caller: Patient Call For: nadel Summary of Call: pt has been having leg cramps "more often since taking omeprazole".  Initial call taken by: Tivis Ringer, CNA,  July 25, 2010 9:53 AM  Follow-up for Phone Call        called and spoke with pt.  pt states when she last saw SN on 06/28/2010 she started taking Omeprazole every morning and shortly after that started noticing cramps in her legs during the night.  Pt states these cramps happen "almost every night."  Pt states she "stretches at night and gets the cramps in her calves and feet."  Pt wanted to know if this is a s/e of the Omeprazole.  Please advise.  Thank you. Aundra Millet Reynolds LPN  July 25, 2010 10:29 AM  NKDA  Additional Follow-up for Phone Call Additional follow up Details #1::        per SN---SN has never known omeprazole to cause leg cramps---nighttime leg cramps are a metobolic problem in the muscles and difficult to treat...  recs are to try tonic water  1 glass at bedtime---if she wants we can change the omeprazole to protonix 40mg   1 by mouth once daily  to be taken prior to the first meal of the day...thanks Randell Loop CMA  July 25, 2010 2:10 PM     Additional Follow-up for Phone Call Additional follow up Details #2::    Pt advised and she wants to try tonic water first and see how this works. does not wnat protonix at this time. Carron Curie CMA  July 25, 2010 2:18 PM

## 2010-11-09 NOTE — Progress Notes (Signed)
Summary: diazepam script  Phone Note Call from Patient Call back at Home Phone [phone]   Caller: Patient Call For: nadel Reason for Call: Refill Medication, Talk to Nurse Summary of Call: wants refill on her nerve pill, can't remember name and threw bottle away. [facility] [address] Initial call taken by: Eugene Gavia,  October 27, 2009 9:10 AM  Follow-up for Phone Call        [facility], there is no "nerve pill" on her med list. Carron Curie CMA  October 27, 2009 9:34 AM  pt returned call. states that dr Kriste Basque had given her a rx "sometime in the past" for some type of generic for valium. [facility] [address] Tivis Ringer, CNA  October 27, 2009 12:38 PM  pt is asking for refill on "nerve pill." Pt does not remember the name, but states it was the generic for valium. States SN rx this for her in the past. I see no record of a nerve pill in EMR. Please advise. Carron Curie CMA  October 27, 2009 1:33 PM   Additional Follow-up for Phone Call Additional follow up Details #1::        per SN----diazepam 5mg   #50  take 1/2 to 1 tab by mouth two times a day as needed for nerves with 1 refill.  thanks Randell Loop CMA  October 27, 2009 2:36 PM     Additional Follow-up for Phone Call Additional follow up Details #2::    called spoke with patient.  advised of SN's response/recs.  pt verbalized her understanding.  rx called into pt's verified pharmacy. Follow-up by: Boone Master CNA,  October 27, 2009 2:42 PM  New/Updated Medications: DIAZEPAM 5 MG TABS (DIAZEPAM) 1/2 - 1 tabs by mouth two times a day as needed for nerves Prescriptions: DIAZEPAM 5 MG TABS (DIAZEPAM) 1/2 - 1 tabs by mouth two times a day as needed for nerves  #50 x 1   Entered by:   Boone Master CNA   Authorized by:   Michele Mcalpine MD   Signed by:   Boone Master CNA on 10/27/2009   Method used:   Telephoned to ...       [facility]  [address] [phone][facility] (retail)       [address]       [place], [place]  [id number]       Ph: [phone] or [phone]       Fax: [phone]   RxID:   [id number]

## 2010-11-09 NOTE — Progress Notes (Signed)
Summary: EPSHOGAS ISSUES  Phone Note Call from Patient Call back at Home Phone [phone]   Caller: Patient Call For: NADEL Summary of Call: PROBLEMS WITH HER ESPHOGAS Initial call taken by: Lacinda Axon,  October 25, 2010 12:46 PM  Follow-up for Phone Call        called spoke with patient who thinks she may needs an endo.  she states that after she swallows she feels like her food is not going down all the way x3-4weeks.  pt states she gets an endo every 3-4years and believes it is time for another.  please advise, thanks! Boone Master CNA/MA  October 25, 2010 2:58 PM   Additional Follow-up for Phone Call Additional follow up Details #1::        per SN----refer to GI for dysphagia---needs asap endo---thanks Randell Loop [facility]  October 25, 2010 4:14 PM     Additional Follow-up for Phone Call Additional follow up Details #2::    Spoke with pt and notified that we will need to refer to GI and needs endo asap.  Pt verbalized understanding.  Order was sent to [facility].  Follow-up by: Vernie Murders,  October 25, 2010 4:34 PM

## 2010-11-09 NOTE — Assessment & Plan Note (Signed)
Summary: problems with digestion/jd   Primary Provider/Referring Provider:  Alroy Dust, MD  CC:  c/o trouble with digestion - feels like food is stuck in upper stomach for a while after eating  - increased belching - denies nausea or vomiting or diarrhea.  History of Present Illness: 70 y/o BF with known history of HTN, DM, and Hyperlipidemia.     ~  2009-01-01:  she is c/o feeling "puffy" in her abd and is considering taking "Digest-Zyme" from her chiropractor... we discussed poss alternatives such as Activia yogurt and Align probiotics... her weight = 180# but she objects "it's just 178 on my home scale", and she is happy w/ her current weight, not motivated to reduce "I'd look terrible"... BS's= 100-115 OK... she had some dysphagia & saw DrJacobs w/ EGD showing Schatzki ring w/ balloon dilatation- improved...  March 17, 2009--Pt presents for acute office visit. Complains of Pt c/o pressure in lower back x 1 week and pressure in lower abdomen x 2 days. Pt states changes in BM's.mild constiipation-hard stools. , increased gas.      ~  June 28, 2009:  50mo f/u doing well- just notes some mild constipation & we discussed options for Miralax/ Senakot-S/ etc... BP controlled- denies CP, palpit, etc...  November 22, 2009--Presents for acute office visit. HA's x2weeks, left side of neck feels swollen and left ear discomfort x2days - states HAs began when she started drinking green tea with ginseng. Stopped ginseng 2 weeks ago, HA got better. No otc used. no speech/visual changes.    Feb 09, 2010 --Presents for an acute office visit. Complains of  trouble with digestion - feels like food is stuck in upper stomach for a while after eating  - increased belching  and bloating. Last endoscopy in 2/10 w/ dilatation required by Dr. Christella Hartigan. Feels like food sticks in mid chest takes  ~2 h to finally go down. no vomitting or choking. NO constipation. Denies chest pain, dyspnea, orthopnea, hemoptysis,  fever, n/v/d, edema, headache,bloody stools.        Current Medications (verified): 1)  Fexofenadine Hcl 180 Mg Tabs (Fexofenadine Hcl) .... Take 1 Tablet By Mouth Once A Day As Needed For Allergies.Marland KitchenMarland Kitchen 2)  Adult Aspirin Ec Low Strength 81 Mg  Tbec (Aspirin) .... Take 1 Tablet By Mouth Once A Day 3)  Norvasc 10 Mg  Tabs (Amlodipine Besylate) .... Take 1 Tablet By Mouth Once A Day 4)  Simvastatin 40 Mg  Tabs (Simvastatin) .... Take 1 Tablet At Bedtime 5)  Metformin Hcl 500 Mg  Tabs (Metformin Hcl) .... Take 1 Tablet Two Times A Day 6)  Omeprazole 20 Mg Cpdr (Omeprazole) .... Take 1 Cap By Mouth Once Daily- 30 Min Before A Meal... 7)  Accu-Chek Aviva  Strp (Glucose Blood) .... Use As Directed 8)  Accu-Chek Aviva  Kit (Blood Glucose Monitoring Suppl) .... Use As Directed 9)  Accu-Chek Aviva  Soln (Blood Glucose Calibration) 10)  Alprazolam 0.5 Mg Tabs (Alprazolam) .... Take 1/2 To 1 Tab By Mouth Three Times A Day As Needed For Nerves...  Allergies (verified): No Known Drug Allergies  Comments:  Nurse/Medical Assistant: The patient's medications and allergies were reviewed with the patient and were updated in the Medication and Allergy Lists.  Past History:  Family History: Last updated: 2010/01/01 Father died age 26 w/ lung cancer, ASHD, stroke. Mother died age 65 w/ MI, HBP. 7 Siblings:  4 Brothers- one w/ prostate cancer 3 Sisters- one died w/ colon  cancer  Social History: Last updated: 12/28/2009 Married, husb= [place], 70yrs 3 Children- 1 son w/ hodgkins, 2 daugh alive & well. Ex-smoker, quit 10 years ago No alcohol High School education employ as cook  Past Medical History: ALLERGIC RHINITIS (ICD-477.9) - she uses Allegra Prn...  HYPERTENSION (ICD-401.9) - controlled on NORVASC 10mg /d and takes ASA 81mg /d as well...     ABNORMAL ELECTROCARDIOGRAM (ICD-794.31) & ECHOCARDIOGRAM, ABNORMAL (ICD-793.2) - she is asymtomatic without CP, palpit, SOB, dizziness or syncope... she  is active w/ walking, treadmill, etc...  ~  baseline EKG showed NSR, poor R progression V1-3 w/ NSSTTWA... no change in f/u tracings.  ~  2DEcho in 2006 showed only some mild SAM, tr MR, tr AI, vigorous LVF w/ sm cavity & EF=75% (no change from 2002)...  ~  2DEcho 2/09 showed:  Mod thickening of the MV leaflets, & mild calcif of the AoV... The left ventricle was mildly dilated. Overall left ventricular systolic function was normal. Left ventricular ejection fraction was estimated to be 60 %. There were no left ventricular regional wall motion abnormalities. Left ventricular wall thickness was mildly increased. There was mild focal basal septal hypertrophy. There was no systolic anterior motion of the mitral valve. There was Doppler evidence for dynamic left ventricular mid-cavity obstruction during Valsalva, with a peak velocity of 2.1 m/sec , and with a peak gradient of 18 mmHg. Doppler parameters were consistent with abnormal left ventricular relaxation.  HYPERCHOLESTEROLEMIA (ICD-272.0) - on SIMVASTATIN 40mg /d.  ~  FLP 7/08 showed TChol 165, Tg 65, HDL 45, LDL 107  ~  FLP 2/09 showed TChol 163, Tg 66, HDL 51, LDL 98... continue same.  ~  FLP 8/09 showed TChol 144, TG 63, HDL 52, LDL 80  ~  FLP 9/10 showed TChol 158, TG 60, HDL 47, LDL 99  ~  FLP 3/11 showed TChol 162, TG 63, HDL 53, LDL 97  DM (ICD-250.00) - doing well on METFORMIN 500mg Bid + diet... BS at home all  ~100-115 per pt.  ~  labs 8/09 showed BS= 111, HgA1c= 6.4  ~  labs 9/10 showed BS= 106, A1c= not done.  ~  labs 3/11 showed BS= 93, A1c= 6.1  Past Surgical History: PHM cont   ~  labs 8/09 showed BS= 111, HgA1c= 6.4  ~  labs 9/10 showed BS= 106, A1c= not done.  ~  labs 3/11 showed BS= 93, A1c= 6.1  GERD (ICD-530.81) - she had EGD in 2002 showing 2cm HH, gastritis, duodenitis... controlled on OMEPRAZOLE 20mg /d...  ~  EGD 2/10 by DrJacobs showed HH/ Schatzki ring w/ balloon dilatation...  DIVERTICULOSIS OF COLON (ICD-562.10) &  COLONIC POLYPS (ICD-211.3) - last colonoscopy was 1/08 by DrSam showing divertics only... f/u 35yrs planned due to [facility] w/ colon Ca...  DEGENERATIVE JOINT DISEASE, GENERALIZED (ICD-715.00)  HEADACHES, HX OF (ICD-V13.8) - CTBrain in 2007 w/ some atrophy.  Hx of TINNITUS (ICD-388.30)  ANXIETY (ICD-300.00) - on ALPRAZOLAM 0.5mg  Prn...  Health Maintenance:  ~  GI= DrJacobs as above...  ~  GYN= sees DrMezer since DrFore passed away- PAP, Mammogram & BMD thru his office.  ~  Immunizations:  yearly flu shots... PNEUMOVAX 9/10 at age 1...        S/P C Section S/P Bilat foot surg for hammer toes     Review of Systems      See HPI  Vital Signs:  Patient profile:   70 year old female Weight:      176 pounds O2 Sat:  96 % on Room air Temp:     97.2 degrees F oral Pulse rate:   88 / minute BP sitting:   128 / 70  (left arm) Cuff size:   regular  Vitals Entered By: Abigail Miyamoto RN (Feb 09, 2010 10:41 AM)  O2 Flow:  Room air  Physical Exam  Additional Exam:  WD, WN, 70 y/o BF in NAD...  GENERAL:  Alert & oriented; pleasant & cooperative... HEENT:  [place]/AT, EACs-clear, TMs-wnl, NOSE-clear, THROAT-clear & wnl. NECK:  Supple w/ full ROM; no JVD; normal carotid impulses w/o bruits; no thyromegaly or nodules palpated; no lymphadenopathy. CHEST:  Clear to P & A; without wheezes/ rales/ or rhonchi. HEART:  Regular Rhythm, gr 1/6 SEM without rubs or gallops heard... ABDOMEN:  Soft & nontender, mild panniculus, normal bowel sounds; no organomegaly or masses detected, neg guarding, or rebound noted.  EXT: without deformities, mild arthritic changes; no varicose veins/ venous insuffic/ or edema.      Impression & Recommendations:  Problem # 1:  DYSPHAGIA (ICD-787.29) Hx of HH w/ previous esophageal dilatation (last 11/2008).  Will refer back to GI to evaluate REC:  We are referring you to GI to evaluate your swallowing.  Increase Prilosec 20mg  two times a day before meal.  Small  portions, chew food well. avoid large pieces of meat and red meat for now.  Gas x w/ meals  Please contact office for sooner follow up if symptoms do not improve or worsen  Orders: Gastroenterology Referral (GI) Est. Patient Level IV (04540)  Medications Added to Medication List This Visit: 1)  Accu-chek Aviva Strp (Glucose blood) .... Use as directed 2)  Accu-chek Aviva Kit (Blood glucose monitoring suppl) .... Use as directed 3)  Accu-chek Aviva Soln (Blood glucose calibration)  Complete Medication List: 1)  Fexofenadine Hcl 180 Mg Tabs (Fexofenadine hcl) .... Take 1 tablet by mouth once a day as needed for allergies.Marland KitchenMarland Kitchen 2)  Adult Aspirin Ec Low Strength 81 Mg Tbec (Aspirin) .... Take 1 tablet by mouth once a day 3)  Norvasc 10 Mg Tabs (Amlodipine besylate) .... Take 1 tablet by mouth once a day 4)  Simvastatin 40 Mg Tabs (Simvastatin) .... Take 1 tablet at bedtime 5)  Metformin Hcl 500 Mg Tabs (Metformin hcl) .... Take 1 tablet two times a day 6)  Omeprazole 20 Mg Cpdr (Omeprazole) .... Take 1 cap by mouth once daily- 30 min before a meal... 7)  Accu-chek Aviva Strp (Glucose blood) .... Use as directed 8)  Accu-chek Aviva Kit (Blood glucose monitoring suppl) .... Use as directed 9)  Accu-chek Aviva Soln (Blood glucose calibration) 10)  Alprazolam 0.5 Mg Tabs (Alprazolam) .... Take 1/2 to 1 tab by mouth three times a day as needed for nerves...  Patient Instructions: 1)  We are referring you to GI to evaluate your swallowing.  2)  Increase Prilosec 20mg  two times a day before meal.  3)  Small portions, chew food well. avoid large pieces of meat and red meat for now.  4)  Gas x w/ meals  5)  Please contact office for sooner follow up if symptoms do not improve or worsen

## 2010-11-09 NOTE — Progress Notes (Signed)
Summary: sore throat > MMW, mucinex  Phone Note Call from Patient Call back at [facility] Phone [phone]   Caller: Patient Call For: nadel Summary of Call: pt c/o sore throat/ drainage x 2 days. feels like phlegm in throat that won't come up. no fever. has not taken any OTC meds. [facility] [address]  Initial call taken by: Tivis Ringer, CNA,  July 26, 2010 8:40 AM  Follow-up for Phone Call        called and spoke with pt.  pt states Sx started yesterday.  Pt c/o sore throat, PND, difficulty swallowing, and head congestion.  Pt denied cough, fever or headache.  Will forward message to SN for recs.  Aundra Millet Reynolds LPN  July 26, 2010 9:01 AM   Additional Follow-up for Phone Call Additional follow up Details #1::        per SN---needs to use mucinex 2 by mouth two times a day with plenty of fluids and mmw  #4 oz  1 tsp gargle and swallow four times daily with 1 refill. Randell Loop CMA  July 26, 2010 10:12 AM   rx sent. LMTCBx1 to advise pt of other recs. Carron Curie CMA  July 26, 2010 11:48 AM     Additional Follow-up for Phone Call Additional follow up Details #2::    Pt returned call. She was informed of above recs per SN and aware MMW rx [facility] [address]  She verbalized understanding of instructions.  Follow-up by: Gweneth Dimitri RN,  July 26, 2010 2:26 PM  New/Updated Medications: * MMW 1 teaspoon gargle and swallow four tims a day Prescriptions: MMW 1 teaspoon gargle and swallow four tims a day  #4oz x 1   Entered by:   Carron Curie CMA   Authorized by:   Michele Mcalpine MD   Signed by:   Carron Curie CMA on 07/26/2010   Method used:   Telephoned to ...       [facility]  [address] [phone]* (retail)       [address]       [place], [place]  [id number]       Ph: [phone] or [phone]       Fax: [phone]   RxID:   [id number]

## 2010-11-09 NOTE — Progress Notes (Signed)
Summary: Triage   Phone Note From Other Clinic   Caller: [facility] @ Dr. Kriste Basque [phone] Call For: Dr. Jarold Motto Summary of Call: Requesting pt. be seen ASAP for dysphagia....possible EGD Initial call taken by: Karna Christmas,  October 25, 2010 4:47 PM  Follow-up for Phone Call        Patient had EGD 11/23/08 with balloon dilatation- Schatzki's ring in distal esophagus. Last office visit 02/22/10 and Dr Christella Hartigan stated he did not believe patient needed EGD at that time. Dr Christella Hartigan, you have an open OV for tomorrow @ 1345, do you want to see the patient or schedule for an EGD with possible dilatation? If so, where would you like the EGD? Thanks, Graciella Freer RN  October 26, 2010 9:09 AM   Additional Follow-up for Phone Call Additional follow up Details #1::        she can go straight to EGD, balloon dilation at [facility], my next avail (non-hosp) thursday.  She needs to chew food well, eat slowly until then. Additional Follow-up by: Rachael Fee MD,  October 26, 2010 9:14 AM    Additional Follow-up for Phone Call Additional follow up Details #2::    Per Dr Christella Hartigan, scheduled patient for Direct ZED Balloon Dilation at [facility] for 11/09/10 at 1430pm. Scheduled w/ Kalman Shan # [phone]. Lmom for Dr Jodelle Green Nurse Alfonso Ramus so she may notify the patient. Graciella Freer RN  October 26, 2010 9:48 AM  Spoke with Bjorn Loser, gave her appointment at [facility], instructed her to eat slowly and chew her food well until then. Bjorn Loser will call the patient. Follow-up by: Graciella Freer RN,  October 26, 2010 10:27 AM

## 2010-11-15 NOTE — Procedures (Signed)
Summary: Upper Endoscopy  Patient: Jenny Madden Note: All result statuses are Final unless otherwise noted.  Tests: (1) Upper Endoscopy (EGD)   EGD Upper Endoscopy       DONE     [facility]     [address] [place], [place]  [address]           ENDOSCOPY PROCEDURE REPORT           PATIENT:  Jenny Madden, Jenny Madden  MR#:  [id number]     BIRTHDATE:  1941/02/12, 69 yrs. old  GENDER:  female     ENDOSCOPIST:  Rachael Fee, MD     PROCEDURE DATE:  11/09/2010     PROCEDURE:  EGD with balloon dilatation     ASA CLASS:  Class II     INDICATIONS:  h/o Schatzki's ring, last dilated 11/2008; now with     recurrent dysphagia     MEDICATIONS:  Fentanyl 50 mcg IV, Versed 5 mg IV     TOPICAL ANESTHETIC:  none           DESCRIPTION OF PROCEDURE:   After the risks benefits and     alternatives of the procedure were thoroughly explained, informed     consent was obtained.  The  endoscope was introduced through the     mouth and advanced to the second portion of the duodenum, without     limitations.  The instrument was slowly withdrawn as the mucosa     was fully examined.     <<PROCEDUREIMAGES>>     There was a minor Schatzki's ring above a 2cm hiatal hernia. The     ring was dilated with a 20mm CRE TTS balloon held inflated for 60     seconds (see image5).  Otherwise the examination was normal (see     image3, image4, image2, and image1).    Retroflexed views revealed     no abnormalities.    The scope was then withdrawn from the patient     and the procedure completed.     COMPLICATIONS:  None           ENDOSCOPIC IMPRESSION:     1) Schatzki's ring above a small hiatal hernia; ring was dilated           2) Otherwise normal examination           RECOMMENDATIONS:     Continue PPI (antiacid medicine), once daily 20-30 min before     breakfast meal.     Would consider repeat dilation as needed.           ______________________________     Rachael Fee, MD        n.     eSIGNED:   Rachael Fee at 11/09/2010 03:29 PM           Zollie Scale, [id number]  Note: An exclamation mark (!) indicates a result that was not dispersed into the flowsheet. Document Creation Date: 11/09/2010 3:30 PM _______________________________________________________________________  (1) Order result status: Final Collection or observation date-time: 11/09/2010 15:23 Requested date-time:  Receipt date-time:  Reported date-time:  Referring Physician:   Ordering Physician: Rob Bunting [phone]) Specimen Source:  Source: Launa Grill Order Number: [id number] Lab site:

## 2010-12-15 ENCOUNTER — Encounter: Payer: Self-pay | Admitting: Pulmonary Disease

## 2010-12-27 ENCOUNTER — Telehealth: Payer: Self-pay | Admitting: Pulmonary Disease

## 2010-12-27 ENCOUNTER — Ambulatory Visit (INDEPENDENT_AMBULATORY_CARE_PROVIDER_SITE_OTHER): Payer: Medicare Other | Admitting: Pulmonary Disease

## 2010-12-27 ENCOUNTER — Other Ambulatory Visit (INDEPENDENT_AMBULATORY_CARE_PROVIDER_SITE_OTHER): Payer: Medicare Other

## 2010-12-27 ENCOUNTER — Encounter: Payer: Self-pay | Admitting: Pulmonary Disease

## 2010-12-27 ENCOUNTER — Ambulatory Visit: Payer: Self-pay | Admitting: Pulmonary Disease

## 2010-12-27 ENCOUNTER — Encounter: Payer: Self-pay | Admitting: Adult Health

## 2010-12-27 DIAGNOSIS — D126 Benign neoplasm of colon, unspecified: Secondary | ICD-10-CM

## 2010-12-27 DIAGNOSIS — F411 Generalized anxiety disorder: Secondary | ICD-10-CM

## 2010-12-27 DIAGNOSIS — E119 Type 2 diabetes mellitus without complications: Secondary | ICD-10-CM

## 2010-12-27 DIAGNOSIS — I10 Essential (primary) hypertension: Secondary | ICD-10-CM

## 2010-12-27 DIAGNOSIS — E7800 Pure hypercholesterolemia, unspecified: Secondary | ICD-10-CM

## 2010-12-27 DIAGNOSIS — K219 Gastro-esophageal reflux disease without esophagitis: Secondary | ICD-10-CM

## 2010-12-27 DIAGNOSIS — M159 Polyosteoarthritis, unspecified: Secondary | ICD-10-CM

## 2010-12-27 LAB — CBC WITH DIFFERENTIAL/PLATELET
Basophils Absolute: 0 10*3/uL (ref 0.0–0.1)
Eosinophils Absolute: 0 10*3/uL (ref 0.0–0.7)
HCT: 37.7 % (ref 36.0–46.0)
Hemoglobin: 12.5 g/dL (ref 12.0–15.0)
Lymphocytes Relative: 47.3 % — ABNORMAL HIGH (ref 12.0–46.0)
Lymphs Abs: 2.9 10*3/uL (ref 0.7–4.0)
MCHC: 33.1 g/dL (ref 30.0–36.0)
MCV: 83.5 fl (ref 78.0–100.0)
Monocytes Absolute: 0.4 10*3/uL (ref 0.1–1.0)
Neutro Abs: 2.8 10*3/uL (ref 1.4–7.7)
RDW: 13.5 % (ref 11.5–14.6)

## 2010-12-27 LAB — BASIC METABOLIC PANEL
BUN: 11 mg/dL (ref 6–23)
Calcium: 9.7 mg/dL (ref 8.4–10.5)
Creatinine, Ser: 0.7 mg/dL (ref 0.4–1.2)
GFR: 102.88 mL/min (ref >60.00)

## 2010-12-27 LAB — HEPATIC FUNCTION PANEL
ALT: 19 U/L (ref 0–35)
AST: 27 U/L (ref 0–37)
Albumin: 4.2 g/dL (ref 3.5–5.2)
Alkaline Phosphatase: 57 U/L (ref 39–117)
Bilirubin, Direct: 0.1 mg/dL (ref 0.0–0.3)
Total Bilirubin: 0.5 mg/dL (ref 0.3–1.2)
Total Protein: 6.9 g/dL (ref 6.0–8.3)

## 2010-12-27 LAB — LIPID PANEL
Cholesterol: 167 mg/dL (ref 0–200)
HDL: 51.9 mg/dL (ref >39.00)
LDL Cholesterol: 105 mg/dL — ABNORMAL HIGH (ref 0–99)
Total CHOL/HDL Ratio: 3
Triglycerides: 51 mg/dL (ref 0.0–149.0)

## 2010-12-27 LAB — TSH: TSH: 1.29 u[IU]/mL (ref 0.35–5.50)

## 2010-12-27 MED ORDER — FEXOFENADINE HCL 180 MG PO TABS: 180.0000 mg | ORAL_TABLET | Freq: Every day | ORAL | Status: DC

## 2010-12-27 MED ORDER — METHOCARBAMOL 500 MG PO TABS: 500.0000 mg | ORAL_TABLET | Freq: Three times a day (TID) | ORAL | Status: DC | PRN

## 2010-12-27 MED ORDER — ATORVASTATIN CALCIUM 40 MG PO TABS: 40.0000 mg | ORAL_TABLET | Freq: Every day | ORAL | Status: DC

## 2010-12-27 NOTE — Progress Notes (Signed)
Subjective:    Patient ID: Jenny Madden, female    DOB: 1941/03/28, 70 y.o.   MRN: [id number]  HPI 70 y/o BF here for a follow up visit and on-going management of mult med problems including HBP;  Abn 2DEcho;  Hyperchol;  DM w/ good control;  GERD/ Divertics/ Colon Polyps;  DJD/ Vit D defic;  Hx HAs &  Tinnitus;  Anxiety...  ~  December 27, 2010:  41mo ROV & stable overall> she notes some left hip & leg discomfort recently due to an exercise program, ?muscle spasm & we discussed trial rest/ heat/ Robaxin & grad ret to exercise;  Next step for further eval = Ortho check...    HBP>  Controlled on Amlodipine w/ BP= 138/62 today & denies CP, palpit, cerebral ischemic symptoms, SOB, edema, etc...    Chol>  Stable on Simva40 w/ TChol 167, TG 51, HDL 52, LDL 105;  She requests ch to generic LIPITOR 40mg /d- OK...    DM>  Stable on Metform500Bid w/ FBS=91 & A1c= ?not done;  REC to get wt down & cut back Metformin to 500mg  QAM only...    GI>  She has GERD, sm HH, schatzki ring (balloon dil 2/10 by DrJacobs), +Divertics, colon polyps;  Stable on Omep?20mg  daily, continue same.    Ortho>  As above + rec to take Caltrate, MVI, Vit D 2000u daily...  Outpatient Encounter Prescriptions as of 12/27/2010  Medication Sig Dispense Refill  . ALPRAZolam (XANAX) 0.5 MG tablet Take 1/2 to 1 whole tablet by mouth three times daily as needed for nerves       . Alum & Mag Hydroxide-Simeth (MAGIC MOUTHWASH) SOLN 1 teaspoon gargle and swallow four times a day       . amLODipine (NORVASC) 10 MG tablet Take by mouth daily.        Marland Kitchen aspirin 81 MG tablet Take by mouth daily.        . Blood Glucose Calibration (ACCU-CHEK AVIVA) SOLN Use as directed to monitor blood glucose       . Blood Glucose Monitoring Suppl (ACCU-CHEK ACTIVE CARE KIT) KIT Use as directed to check blood glucose       . Cholecalciferol (VITAMIN D) 2000 UNITS CAPS Take 1 capsule by mouth daily.        . fexofenadine (ALLEGRA) 180 MG tablet Take 1 tablet (180  mg total) by mouth daily. Take 1 tablet by mouth once daily as needed for allergies  90 tablet  3  . glucose blood (ACCU-CHEK AVIVA) test strip Use as directed to check blood glucose       . metFORMIN (GLUCOPHAGE) 500 MG tablet Take by mouth 2 (two) times daily with a meal.        . methocarbamol (ROBAXIN) 500 MG tablet Take 1 tablet (500 mg total) by mouth 3 (three) times daily as needed.  50 tablet  5  . DISCONTD: atorvastatin (LIPITOR) 40 MG tablet Take 1 tablet (40 mg total) by mouth daily.  90 tablet  3  . simvastatin (ZOCOR) 40 MG tablet Take by mouth at bedtime.        Marland Kitchen DISCONTD: atorvastatin (LIPITOR) 40 MG tablet Take 1 tablet (40 mg total) by mouth daily.  90 tablet  3  . DISCONTD: fexofenadine (ALLEGRA) 180 MG tablet Take 1 tablet by mouth once daily as needed for allergies         Review of Systems   Constitutional:  Denies F/C/S, anorexia, unexpected weight change.  HEENT:  No HA, visual changes, earache, sore throat, hoarseness; notes allergy symptoms. Resp:  No cough, sputum, hemoptysis; no SOB, tightness, wheezing. Cardio:  No CP, ch in DOE, orthopnea, edema; notes occas palpit- "it's gas" she says. GI:  Denies N/V/D/C or blood in stool; no reflux, abd pain, distention. GU:  No dysuria, freq, urgency, hematuria, or flank pain. MS:  Notes pain left post hip/ thigh related to exerc; good ROM, no rash, no swelling, etc. Neuro:  No tremors, seizures, dizziness, syncope, weakness, numbness, gait abn. Skin:  No suspicious lesions or skin rash. Heme:  No adenopathy, bruising, bleeding. Psyche: Denies confusion, sleep disturbance, hallucinations, anxiety, depression.    Objective:   Physical Exam   WD, WN, 69 y/o BF in NAD... Vital Signs:  Reviewed... BP= 138/62 General:  Alert & oriented; pleasant & cooperative... HEENT:  [place]/AT, EOM-wnl, PERRLA, EACs-clear, TMs-wnl, NOSE-clear, THROAT-clear & wnl. Neck:  Supple w/ fair ROM; no JVD; normal carotid impulses w/o bruits; no  thyromegaly or nodules palpated; no lymphadenopathy. Chest:  Clear to P & A; without wheezes/ rales/ or rhonchi heard... Heart:  Regular Rhythm; norm S1 & S2 without murmurs/ rubs/ or gallops detected... Abdomen:  Soft & nontender; normal bowel sounds; no organomegaly or masses palpated... Ext:  without deformities, mild arthritic changes; no varicose veins, +venous insuffic, no edema;  Neuro:  CNs intact; motor testing normal; sensory testing normal; gait normal & balance OK... Derm:  No lesions noted; no rash etc... Lymph:  No cervical, supraclavicular, axillary, or inguinal adenopathy palpated...    Assessment & Plan:

## 2010-12-27 NOTE — Patient Instructions (Signed)
Today we updated your med list> please check it for accuracy... We discussed taking Caltrate, Women's Multivit, & Vit D 200u daily for your bones... We refilled your Fexofenadine for your allergies... We also decided to change the Simvastatin back to the LIPITOR 40mg  (new generic Atorvastatin) daily...  For your leg discomfort:  We decided to Rx w/ rest/ heat/ & ROBAXIN- take 1 tab  Up to three times daily as needed for muscle spasm... If the symptom persists- let me know for Orthopedic referral...  Today we did your follow up FASTING blood work... Please call the "phone tree" in several days to get your results...  Stay as active as possible, and stay on track w/ your diet program for weight reduction... Call for any questions... Let's plan a routine follow up in 6 months...  PHONE TREE: Call [phone] & when prompted enter your pt number- [phone] followed by the # symbol.

## 2010-12-27 NOTE — Telephone Encounter (Signed)
Currently on Simvastatin and wants to continue as she has called the insurance company and Lipitor will cost to much. Pt aware that I am sending this message to SN as an FYI.

## 2010-12-28 ENCOUNTER — Encounter: Payer: Self-pay | Admitting: Pulmonary Disease

## 2010-12-28 MED ORDER — SIMVASTATIN 40 MG PO TABS: 40.0000 mg | ORAL_TABLET | Freq: Every evening | ORAL | Status: DC

## 2010-12-28 NOTE — Telephone Encounter (Signed)
Called and spoke with pt and she stated that she will stay with the simvastatin as before since the rx for the lipitor was going to cost her $114.  Will make SN aware

## 2010-12-30 ENCOUNTER — Encounter: Payer: Self-pay | Admitting: Pulmonary Disease

## 2010-12-30 NOTE — Assessment & Plan Note (Signed)
BP controlled on Norvasc>  Continue same.

## 2010-12-30 NOTE — Assessment & Plan Note (Signed)
She is c/o discomfortin left post thigh area ?etiology;  She thinks it's muscular 7 secondary to her exercises;  We discussed rest/ heat/ cut back on exercises for now/ & try ROBAXIN Tid as needed for musc spasm;  Next step = ortho eval.

## 2010-12-30 NOTE — Assessment & Plan Note (Signed)
Stable on Simva40 + diet, etc> FLP showed reasonable numbers & rec to continue Simva40 + better diet, get wt down.

## 2010-12-30 NOTE — Assessment & Plan Note (Signed)
Stable on Alprazolam Prn use.Marland KitchenMarland Kitchen

## 2010-12-30 NOTE — Assessment & Plan Note (Signed)
She seems stable on the Omep PPI therapy;  Denies swallowing difficulty etc..Marland Kitchen

## 2010-12-30 NOTE — Assessment & Plan Note (Signed)
DM controlled on Metformin> BS=91, A1c wasn't done as requested;  Rec> continue diet, exerc & metformin bid.Marland KitchenMarland Kitchen

## 2011-01-15 ENCOUNTER — Telehealth: Payer: Self-pay | Admitting: Pulmonary Disease

## 2011-01-15 NOTE — Telephone Encounter (Signed)
Spoke w/ [facility] and they state they did not know the atorvastatin was d/c'd. I advised them that it was. Pt also states she did receive her simvastatin and we just needed to call and inform [facility] she was no longer taking the atorvastatin so they can take it off her list. I advised pt Janine from [facility] informed me they have taken this off her medlist. Nothing further was needed

## 2011-01-23 ENCOUNTER — Telehealth: Payer: Self-pay | Admitting: Pulmonary Disease

## 2011-01-23 NOTE — Telephone Encounter (Signed)
lmtcb with spouse

## 2011-01-23 NOTE — Telephone Encounter (Signed)
Patient returning call.

## 2011-01-23 NOTE — Telephone Encounter (Signed)
Per pt [facility] states they did not have documentation of [place] calling. I called and spoke with Johnny at [facility] and he advised he will initiate a resend order. Once the pt receives the self stamped envelope she will need to send the medication back in it and will get reimbursed once they receive it. I called and advised pt of same. Pt verbalized understanding. Pt states she will call them and follow up as well.

## 2011-02-19 ENCOUNTER — Encounter: Payer: Self-pay | Admitting: Pulmonary Disease

## 2011-03-12 ENCOUNTER — Telehealth: Payer: Self-pay | Admitting: *Deleted

## 2011-03-12 DIAGNOSIS — M25519 Pain in unspecified shoulder: Secondary | ICD-10-CM

## 2011-03-12 NOTE — Telephone Encounter (Signed)
Called, spoke with pt.  States she is having soreness in the left shoulder and muscles in left arm when she uses it.  Started x 2 wks ago.  Was taking robaxin but stopped it d/t dizziness.  Requesting SN's recs.  [facility] [address]  [place] - verified.  Dr. Kriste Basque, pls advise.  Thanks!

## 2011-03-12 NOTE — Telephone Encounter (Signed)
Called and spoke with pt and per SN---try advil or tylenol and will need ov with ortho asap.  Pt is aware that we will call her tomorrow for this appt.

## 2011-03-12 NOTE — Telephone Encounter (Signed)
Patient called back wanted to know if Dr Jodelle Green nurse had this message yet she is still waiting on a call back she can be reached at [phone].Jenny Madden

## 2011-04-16 ENCOUNTER — Telehealth: Payer: Self-pay | Admitting: Pulmonary Disease

## 2011-04-16 MED ORDER — GLUCOSE BLOOD VI STRP: ORAL_STRIP | Status: DC

## 2011-04-16 NOTE — Telephone Encounter (Signed)
Pt aware RX for test strips sent to her mail order pharmacy.

## 2011-06-18 ENCOUNTER — Other Ambulatory Visit (HOSPITAL_COMMUNITY): Payer: Self-pay | Admitting: Gynecology

## 2011-06-18 DIAGNOSIS — Z1231 Encounter for screening mammogram for malignant neoplasm of breast: Secondary | ICD-10-CM

## 2011-06-28 ENCOUNTER — Ambulatory Visit (INDEPENDENT_AMBULATORY_CARE_PROVIDER_SITE_OTHER): Payer: Medicare Other | Admitting: Pulmonary Disease

## 2011-06-28 ENCOUNTER — Other Ambulatory Visit (INDEPENDENT_AMBULATORY_CARE_PROVIDER_SITE_OTHER): Payer: Medicare Other

## 2011-06-28 ENCOUNTER — Encounter: Payer: Self-pay | Admitting: Pulmonary Disease

## 2011-06-28 DIAGNOSIS — E7800 Pure hypercholesterolemia, unspecified: Secondary | ICD-10-CM

## 2011-06-28 DIAGNOSIS — D126 Benign neoplasm of colon, unspecified: Secondary | ICD-10-CM

## 2011-06-28 DIAGNOSIS — I10 Essential (primary) hypertension: Secondary | ICD-10-CM

## 2011-06-28 DIAGNOSIS — K219 Gastro-esophageal reflux disease without esophagitis: Secondary | ICD-10-CM

## 2011-06-28 DIAGNOSIS — M159 Polyosteoarthritis, unspecified: Secondary | ICD-10-CM

## 2011-06-28 DIAGNOSIS — Z23 Encounter for immunization: Secondary | ICD-10-CM

## 2011-06-28 DIAGNOSIS — E119 Type 2 diabetes mellitus without complications: Secondary | ICD-10-CM

## 2011-06-28 DIAGNOSIS — K5730 Diverticulosis of large intestine without perforation or abscess without bleeding: Secondary | ICD-10-CM

## 2011-06-28 DIAGNOSIS — F411 Generalized anxiety disorder: Secondary | ICD-10-CM

## 2011-06-28 LAB — BASIC METABOLIC PANEL
CO2: 28 mEq/L (ref 19–32)
Calcium: 9.7 mg/dL (ref 8.4–10.5)
Chloride: 105 mEq/L (ref 96–112)
Glucose, Bld: 107 mg/dL — ABNORMAL HIGH (ref 70–99)
Potassium: 4 mEq/L (ref 3.5–5.1)
Sodium: 140 mEq/L (ref 135–145)

## 2011-06-28 LAB — HEPATIC FUNCTION PANEL
ALT: 17 U/L (ref 0–35)
AST: 23 U/L (ref 0–37)
Albumin: 4.3 g/dL (ref 3.5–5.2)
Alkaline Phosphatase: 65 U/L (ref 39–117)
Bilirubin, Direct: 0.1 mg/dL (ref 0.0–0.3)
Total Protein: 7.5 g/dL (ref 6.0–8.3)

## 2011-06-28 LAB — LIPID PANEL: HDL: 52.4 mg/dL (ref >39.00)

## 2011-06-28 MED ORDER — AMLODIPINE BESYLATE 10 MG PO TABS: 10.0000 mg | ORAL_TABLET | Freq: Every day | ORAL | Status: DC

## 2011-06-28 MED ORDER — SIMVASTATIN 40 MG PO TABS: 40.0000 mg | ORAL_TABLET | Freq: Every evening | ORAL | Status: DC

## 2011-06-28 MED ORDER — METFORMIN HCL 500 MG PO TABS: 500.0000 mg | ORAL_TABLET | Freq: Two times a day (BID) | ORAL | Status: DC

## 2011-06-28 NOTE — Patient Instructions (Signed)
Today we updated your med list in EPIC...    We refilled your meds per request...  Today we did your follow up fasting blood work...    Please call the PHONE TREE in a few days for your results...    Dial [id number] & when prompted enter your patient number followed by the # symbol...    Your patient number is:  [id number]  Remember to restrict dietary sugars (carbs) and fat, work on weight reduction...  Call for any questions...  Let's plan a follow up visit in another 6 months, sooner if needed for problems.Marland KitchenMarland Kitchen

## 2011-06-28 NOTE — Progress Notes (Signed)
Subjective:    Patient ID: Jenny Madden, female    DOB: 1940/12/31, 70 y.o.   MRN: [id number]  HPI 70 y/o BF here for a follow up visit and on-going management of mult med problems including HBP;  Abn 2DEcho;  Hyperchol;  DM w/ good control;  GERD/ Divertics/ Colon Polyps;  DJD/ Vit D defic;  Hx HAs &  Tinnitus;  Anxiety...  ~  December 27, 2010:  68mo ROV & stable overall> she notes some left hip & leg discomfort recently due to an exercise program, ?muscle spasm & we discussed trial rest/ heat/ Robaxin & grad ret to exercise;  Next step for further eval = Ortho check...    HBP>  Controlled on Amlodipine w/ BP= 138/62 today & denies CP, palpit, cerebral ischemic symptoms, SOB, edema, etc...    Chol>  Stable on Simva40 w/ TChol 167, TG 51, HDL 52, LDL 105;  She requests ch to generic LIPITOR 40mg /d- OK...    DM>  Stable on Metform500Bid w/ FBS=91 & A1c= ?not done;  REC to get wt down & cut back Metformin to 500mg  QAM only...    GI>  She has GERD, sm HH, schatzki ring (balloon dil 2/10 by DrJacobs), +Divertics, colon polyps;  Stable on Omep?20mg  daily, continue same.    Ortho>  As above + rec to take Caltrate, MVI, Vit D 2000u daily...  ~  June 28, 2011:  68mo ROV & she reports that he never switched to Atorvastatin due to it's cost (still on Simva40; weight is unchanged at 176# not really on a diet "I try to eat right"    HBP>  Controlled on Amlodipine w/ BP= 126/68 today & denies CP, palpit, cerebral ischemic symptoms, SOB, edema, etc...    Chol>  Stable on Simva40 w/ TChol 160, TG 59, HDL 52, LDL 96;  Continue same + low chol/ low fat diet...    DM>  Stable on Metform500Bid w/ FBS=107 & A1c=6.2;  She saw DrHecker 4/12 w/ mild DM retinopathy noted; BS at home are all good 90-110 range.    GI>  She has GERD, sm HH, schatzki ring (balloon dil 2/10 by DrJacobs), +Divertics, colon polyps;  Stable on Omep?20mg  PRN, denies pain or dysphagia,contin same.    Ortho>  C/o some left shoulder pain for  which she saw DrWeingold- given shot (helped some) & PT (helped but too $$); rec to take Caltrate, MVI, Vit D 2000u daily...          Problem List:  ALLERGIC RHINITIS (ICD-477.9) - she uses Allegra Prn...  HYPERTENSION (ICD-401.9) - controlled on NORVASC 10mg /d and takes ASA 81mg /d as well...  BP= 134/82 today & similar at home... tolerated well and denies HA, fatigue, visual changes, CP, palipit, dizziness, syncope, dyspnea, edema, etc...  ABNORMAL ELECTROCARDIOGRAM (ICD-794.31) & ECHOCARDIOGRAM, ABNORMAL (ICD-793.2) - she is asymtomatic without CP, palpit, SOB, dizziness or syncope... she is active w/ walking, treadmill, etc... ~  baseline EKG showed NSR, poor R progression V1-3 w/ NSSTTWA... no change in f/u tracings. ~  2DEcho in 2006 showed only some mild SAM, tr MR, tr AI, vigorous LVF w/ sm cavity & EF=75% (no change from 2002)... ~  2DEcho 2/09 showed:  Mod thickening of the MV leaflets, & mild calcif of the AoV... The left ventricle was mildly dilated. Overall left ventricular systolic function was normal. Left ventricular ejection fraction was estimated to be 60 %. There were no left ventricular regional wall motion abnormalities. Left ventricular wall thickness  was mildly increased. There was mild focal basal septal hypertrophy. There was no systolic anterior motion of the mitral valve. There was Doppler evidence for dynamic left ventricular mid-cavity obstruction during Valsalva, with a peak velocity of 2.1 m/sec , and with a peak gradient of 18 mmHg. Doppler parameters were consistent with abnormal left ventricular relaxation.  HYPERCHOLESTEROLEMIA (ICD-272.0) - on SIMVASTATIN 40mg /d + diet efforts. ~  FLP 7/08 showed TChol 165, Tg 65, HDL 45, LDL 107. ~  FLP 8/09 showed TChol 144, TG 63, HDL 52, LDL 80 ~  FLP 9/10 showed TChol 158, TG 60, HDL 47, LDL 99 ~  FLP 3/11 showed TChol 162, TG 63, HDL 53, LDL 97 ~  FLP 9/11 showed TChol 169, TG 58, HDL 45, LDL 113 ~  FLP 3/12 on Simva40  showed TChol 167, TG 51, HDL 52, LDL 105 ~  FLP 9/12 on Simva40 showed TChol 160, TG 59, HDL 52, LDL 96  DM (ICD-250.00) - doing well on METFORMIN 500mg Bid + diet... BS at home all ~100-115 per pt. ~  labs 7/08 showed BS=111, HgA1c=6.1 ~  labs 2.09 showed BS= 112, HgA1c= 6.3.Marland KitchenMarland Kitchen continue same. ~  labs 8/09 showed BS= 111, HgA1c= 6.4 ~  labs 9/10 showed BS= 106, A1c= not done. ~  labs 3/11 showed BS= 93, A1c= 6.1 ~  labs 9/11 showed BS= 95 ~  Labs 9/12 showed BS= 107, A1c= 6.2  GERD (ICD-530.81) - she had EGD in 2002 showing 2cm HH, gastritis, duodenitis... controlled on OMEPRAZOLE 20mg /d... ~  EGD 2/10 by DrJacobs showed HH/ Schatzki ring w/ balloon dilatation...  DIVERTICULOSIS OF COLON (ICD-562.10) & COLONIC POLYPS (ICD-211.3) - last colonoscopy was 1/08 by DrSam showing divertics only... f/u 71yrs planned due to [facility] w/ colon Ca...  DEGENERATIVE JOINT DISEASE, GENERALIZED (ICD-715.00)  VIT D DEFICIENCY - Vit D level 9/11= 23... rec to start 2000 u OTC Vit D supplement daily...  HEADACHES, HX OF (ICD-V13.8) - CTBrain in 2007 w/ some atrophy.  Hx of TINNITUS (ICD-388.30)  ANXIETY (ICD-300.00) - on ALPRAZOLAM 0.5mg  Prn...  Health Maintenance: ~  GI= DrJacobs as above... ~  GYN= sees DrMezer since DrFore passed away- PAP, Mammogram & BMD thru his office. ~  Immunizations:  yearly flu shots... PNEUMOVAX 9/10 at age 66... TDAP given 3/11...   Past Surgical History  Procedure Date  . Cesarean section   . Foot surgery     bilateral for hammer toes    Outpatient Encounter Prescriptions as of 06/28/2011  Medication Sig Dispense Refill  . ALPRAZolam (XANAX) 0.5 MG tablet Take 1/2 to 1 whole tablet by mouth three times daily as needed for nerves       . Alum & Mag Hydroxide-Simeth (MAGIC MOUTHWASH) SOLN 1 teaspoon gargle and swallow four times a day       . amLODipine (NORVASC) 10 MG tablet Take by mouth daily.        Marland Kitchen aspirin 81 MG tablet Take by mouth daily.        . Blood Glucose  Calibration (ACCU-CHEK AVIVA) SOLN Use as directed to monitor blood glucose       . Blood Glucose Monitoring Suppl (ACCU-CHEK ACTIVE CARE KIT) KIT Use as directed to check blood glucose       . Cholecalciferol (VITAMIN D) 2000 UNITS CAPS Take 1 capsule by mouth daily.        . fexofenadine (ALLEGRA) 180 MG tablet Take 1 tablet (180 mg total) by mouth daily. Take 1 tablet by mouth  once daily as needed for allergies  90 tablet  3  . glucose blood (ACCU-CHEK AVIVA) test strip Use as directed to check blood glucose  100 each  3  . metFORMIN (GLUCOPHAGE) 500 MG tablet Take 500 mg by mouth 2 (two) times daily with a meal.       . simvastatin (ZOCOR) 40 MG tablet Take 1 tablet (40 mg total) by mouth every evening.  30 tablet  11  . DISCONTD: methocarbamol (ROBAXIN) 500 MG tablet Take 1 tablet (500 mg total) by mouth 3 (three) times daily as needed.  50 tablet  5  . DISCONTD: simvastatin (ZOCOR) 40 MG tablet Take by mouth at bedtime.          No Known Allergies   Current Medications, Allergies, Past Medical History, Past Surgical History, Family History, and Social History were reviewed in [employer] record.    Review of Systems   Constitutional:  Denies F/C/S, anorexia, unexpected weight change. HEENT:  No HA, visual changes, earache, sore throat, hoarseness; notes allergy symptoms. Resp:  No cough, sputum, hemoptysis; no SOB, tightness, wheezing. Cardio:  No CP, ch in DOE, orthopnea, edema; notes occas palpit- "it's gas" she says. GI:  Denies N/V/D/C or blood in stool; no reflux, abd pain, distention. GU:  No dysuria, freq, urgency, hematuria, or flank pain. MS:  Notes pain left post hip/ thigh related to exerc; good ROM, no rash, no swelling, etc. Neuro:  No tremors, seizures, dizziness, syncope, weakness, numbness, gait abn. Skin:  No suspicious lesions or skin rash. Heme:  No adenopathy, bruising, bleeding. Psyche: Denies confusion, sleep disturbance, hallucinations,  anxiety, depression.    Objective:   Physical Exam   WD, WN, 69 y/o BF in NAD... Vital Signs:  Reviewed... BP= 138/62 General:  Alert & oriented; pleasant & cooperative... HEENT:  [place]/AT, EOM-wnl, PERRLA, EACs-clear, TMs-wnl, NOSE-clear, THROAT-clear & wnl. Neck:  Supple w/ fair ROM; no JVD; normal carotid impulses w/o bruits; no thyromegaly or nodules palpated; no lymphadenopathy. Chest:  Clear to P & A; without wheezes/ rales/ or rhonchi heard... Heart:  Regular Rhythm; norm S1 & S2 without murmurs/ rubs/ or gallops detected... Abdomen:  Soft & nontender; normal bowel sounds; no organomegaly or masses palpated... Ext:  without deformities, mild arthritic changes; no varicose veins, +venous insuffic, no edema;  Neuro:  CNs intact; motor testing normal; sensory testing normal; gait normal & balance OK... Derm:  No lesions noted; no rash etc... Lymph:  No cervical, supraclavicular, axillary, or inguinal adenopathy palpated...    Assessment & Plan:   HBP>  Controlled on Amlodipine monotherapy; continue same + diet, exercise, keep wt down...  ABN EKG & ECHO>  SEE ABOVE, on ASA & Amlodipine, continue same, stay active etc...  HYPERCHOL>  Stable on Simva40 w/ FLP looking good, but needs better diet 7 some wt reduction...  DM>  Stable on the Metformin monotherapy, we reviewed diet as well...  GERD>  She denies reflux or dysphagia; uses the Omep 20mg  as needed...  Divertics, Polyps>  Her brother had colon cancer; due for 50yr f/u colon 1/13...  DJD>  She knows to minimize any NSAIDs 7 use Tylenol etc; she has f/u DrWeingold for her left shoulder discomfort...  Vit D Defic>  On OTC Vit D supplementation...  Anaxiety>  pon Alprazolam as needed.Marland KitchenMarland Kitchen

## 2011-07-01 ENCOUNTER — Encounter: Payer: Self-pay | Admitting: Pulmonary Disease

## 2011-07-13 ENCOUNTER — Other Ambulatory Visit: Payer: Self-pay | Admitting: Pulmonary Disease

## 2011-07-13 MED ORDER — OMEPRAZOLE 20 MG PO CPDR: 20.0000 mg | DELAYED_RELEASE_CAPSULE | Freq: Every day | ORAL | Status: DC

## 2011-07-24 ENCOUNTER — Ambulatory Visit (HOSPITAL_COMMUNITY)
Admission: RE | Admit: 2011-07-24 | Discharge: 2011-07-24 | Disposition: A | Discharge status: Home / Self Care | Payer: Medicare Other | Source: Ambulatory Visit | Attending: Gynecology | Admitting: Gynecology

## 2011-07-24 ENCOUNTER — Telehealth: Payer: Self-pay | Admitting: Pulmonary Disease

## 2011-07-24 DIAGNOSIS — Z1231 Encounter for screening mammogram for malignant neoplasm of breast: Secondary | ICD-10-CM | POA: Insufficient documentation

## 2011-07-24 NOTE — Telephone Encounter (Signed)
Pt states she received a letter from [facility] stating that it was time for renewal of her prescription for Omeprazole and that they had not received a new Rx from our office. The RX was sent out on 07/13/11 but the pharmacy did not receive it. I gave a verbal order today for the pt's Omeprazole and the pt is aware.

## 2011-09-19 ENCOUNTER — Telehealth: Payer: Self-pay | Admitting: Pulmonary Disease

## 2011-09-19 NOTE — Telephone Encounter (Signed)
Per SN---stop the simvastatin and go with a low chol/low fat diet alone.  thanks

## 2011-09-19 NOTE — Telephone Encounter (Signed)
Spoke with pt. She states that her pharmacist told her that she needs to discuss with SN the interaction between simvastatin and amlodipine. Pharmacist told her that sometimes these two meds can work against each other and cause muscle aches. She states that she has been noticing muscle pain. Please advise recs, thanks! No Known Allergies

## 2011-09-19 NOTE — Telephone Encounter (Signed)
Spoke with pt and notified of recs per SN. Pt verbalized understanding and states nothing further needed.  

## 2011-10-19 ENCOUNTER — Ambulatory Visit (INDEPENDENT_AMBULATORY_CARE_PROVIDER_SITE_OTHER): Payer: Medicare Other | Admitting: Gastroenterology

## 2011-10-19 ENCOUNTER — Encounter: Payer: Self-pay | Admitting: Gastroenterology

## 2011-10-19 DIAGNOSIS — Z809 Family history of malignant neoplasm, unspecified: Secondary | ICD-10-CM

## 2011-10-19 DIAGNOSIS — R1310 Dysphagia, unspecified: Secondary | ICD-10-CM

## 2011-10-19 MED ORDER — PEG-KCL-NACL-NASULF-NA ASC-C 100 G PO SOLR: 1.0000 | ORAL | Status: DC

## 2011-10-19 NOTE — Patient Instructions (Addendum)
You will be set up for an upper endoscopy for dysphagia, balloon dilation. You will be set up for a colonoscopy for family history of colon cancer (brother).

## 2011-10-19 NOTE — Progress Notes (Signed)
Review of gastrointestinal problems:  1. Dysphasia, likely from Schatzki's ring noted and dilated to 20 mm during EGD February, 2010.  Repeat EGD February 2012 documented a Schatzki's ring that was again dilated up to 20 mm. 2. History of colonic polyps, Unclear pathology since they were not sinceTo pathology, SML 1998. Never had confirmed adenomatous polyps on followup colonoscopy 2002, 2008. Was recommended by [facility] to have a repeat colonoscopy at 7 year interval following her last one in 2008   HPI: This is a  very pleasant 71 year old woman whom I last saw about a year ago the time of upper endoscopy with dilation of Schatzki ring.  About a month ago she began to have solid food dysphagia again.  Liquids go down ok usually.  She does not get heartburn.  Overall stable weight.     She told me today that her brother had colon cancer. Her last colonoscopy was in 2008, she was not due for recall colonoscopy until 7 years from them according to her previous gastroenterologist that given her family history the proper recall is actually 5 years and we will make that change.   Past Medical History  Diagnosis Date  . Allergic rhinitis, cause unspecified   . Unspecified essential hypertension   . Electrocardiogram finding, abnormal, without diagnosis   . Nonspecific (abnormal) findings on radiological and other examination of other intrathoracic organs   . Pure hypercholesterolemia   . Type II or unspecified type diabetes mellitus without mention of complication, not stated as uncontrolled   . Other dysphagia   . Diverticulosis of colon (without mention of hemorrhage)   . Benign neoplasm of colon   . Family history of malignant neoplasm of gastrointestinal tract   . Generalized osteoarthrosis, unspecified site   . Generalized headaches   . Unspecified tinnitus   . Anxiety state, unspecified     Past Surgical History  Procedure Date  . Cesarean section   . Foot surgery     bilateral for  hammer toes    Current Outpatient Prescriptions  Medication Sig Dispense Refill  . ALPRAZolam (XANAX) 0.5 MG tablet Take 1/2 to 1 whole tablet by mouth three times daily as needed for nerves       . Alum & Mag Hydroxide-Simeth (MAGIC MOUTHWASH) SOLN 1 teaspoon gargle and swallow four times a day       . amLODipine (NORVASC) 10 MG tablet Take 1 tablet (10 mg total) by mouth daily.  90 tablet  3  . aspirin 81 MG tablet Take by mouth daily.        . Blood Glucose Calibration (ACCU-CHEK AVIVA) SOLN Use as directed to monitor blood glucose       . Blood Glucose Monitoring Suppl (ACCU-CHEK ACTIVE CARE KIT) KIT Use as directed to check blood glucose       . Cholecalciferol (VITAMIN D) 2000 UNITS CAPS Take 1 capsule by mouth daily.        . fexofenadine (ALLEGRA) 180 MG tablet Take 1 tablet (180 mg total) by mouth daily. Take 1 tablet by mouth once daily as needed for allergies  90 tablet  3  . glucose blood (ACCU-CHEK AVIVA) test strip Use as directed to check blood glucose  100 each  3  . metFORMIN (GLUCOPHAGE) 500 MG tablet Take 1 tablet (500 mg total) by mouth 2 (two) times daily with a meal.  180 tablet  3  . omeprazole (PRILOSEC) 20 MG capsule Take 1 capsule (20 mg total) by mouth  daily.  90 capsule  3  . simvastatin (ZOCOR) 40 MG tablet Take 1 tablet (40 mg total) by mouth every evening.  90 tablet  3    Allergies as of 10/19/2011  . (No Known Allergies)    Family History  Problem Relation Age of Onset  . Stroke Father   . Hypertension Mother   . Lung cancer Father   . Coronary artery disease Father   . Heart attack Mother   . Colon cancer Brother     History   Social History  . Marital Status: Married    Spouse Name: bob x 37 yrs    Number of Children: 3  . Years of Education: N/A   Occupational History  . cook   .     Social History Main Topics  . Smoking status: Former Smoker    Types: Cigarettes    Quit date: 10/09/1999  . Smokeless tobacco: Never Used  . Alcohol  Use: Yes     social use  . Drug Use: No  . Sexually Active: Not on file   Other Topics Concern  . Not on file   Social History Narrative   Married to husband, Nadine Counts x82yrs3 children - 1 son w/ Hodgkin's, 2 daughters alive and wellHigh school education      Physical Exam: BP 136/74  Pulse 88  Ht 5\' 4"  (1.626 m)  Wt 174 lb (78.926 kg)  BMI 29.87 kg/m2 Constitutional: generally well-appearing Psychiatric: alert and oriented x3 Abdomen: soft, nontender, nondistended, no obvious ascites, no peritoneal signs, normal bowel sounds     Assessment and plan: 71 y.o. female with family history of colon cancer, dysphagia, history of Schatzki's ring  I will proceed with EGD and colonoscopy at her soonest convenience. I see no reason for any further blood tests or imaging studies prior to then.

## 2011-10-22 ENCOUNTER — Telehealth: Payer: Self-pay | Admitting: Gastroenterology

## 2011-10-22 NOTE — Telephone Encounter (Signed)
Free movi prep coupon left at front desk for pt to pick up she is aware

## 2011-10-23 ENCOUNTER — Telehealth: Payer: Self-pay | Admitting: Gastroenterology

## 2011-10-23 NOTE — Telephone Encounter (Signed)
Pt was informed to have her pharmacy run her prescription as self pay.

## 2011-10-30 ENCOUNTER — Encounter: Payer: Self-pay | Admitting: Gastroenterology

## 2011-10-30 ENCOUNTER — Ambulatory Visit (AMBULATORY_SURGERY_CENTER): Payer: Medicare Other | Admitting: Gastroenterology

## 2011-10-30 DIAGNOSIS — K222 Esophageal obstruction: Secondary | ICD-10-CM

## 2011-10-30 DIAGNOSIS — Z809 Family history of malignant neoplasm, unspecified: Secondary | ICD-10-CM

## 2011-10-30 DIAGNOSIS — K5730 Diverticulosis of large intestine without perforation or abscess without bleeding: Secondary | ICD-10-CM

## 2011-10-30 DIAGNOSIS — Q393 Congenital stenosis and stricture of esophagus: Secondary | ICD-10-CM

## 2011-10-30 DIAGNOSIS — Z1211 Encounter for screening for malignant neoplasm of colon: Secondary | ICD-10-CM

## 2011-10-30 DIAGNOSIS — R1310 Dysphagia, unspecified: Secondary | ICD-10-CM

## 2011-10-30 DIAGNOSIS — Q391 Atresia of esophagus with tracheo-esophageal fistula: Secondary | ICD-10-CM

## 2011-10-30 LAB — GLUCOSE, CAPILLARY
Glucose-Capillary: 148 mg/dL — ABNORMAL HIGH (ref 70–99)
Glucose-Capillary: 66 mg/dL — ABNORMAL LOW (ref 70–99)

## 2011-10-30 MED ORDER — SODIUM CHLORIDE 0.9 % IV SOLN: 500.0000 mL | INTRAVENOUS | Status: DC

## 2011-10-30 NOTE — Patient Instructions (Signed)
Discharge instructions given with verbal understanding. Handouts on diverticulosis,high fiber diet, and a hiatal hernia given. Dilatation diet given. Resume previous medications.

## 2011-10-30 NOTE — Op Note (Signed)
[facility] [address] [place], [place]  [address]  COLONOSCOPY PROCEDURE REPORT  PATIENT:  Jenny Madden, Jenny Madden  MR#:  [id number] BIRTHDATE:  1941-02-20, 70 yrs. old  GENDER:  female ENDOSCOPIST:  Rachael Fee, MD PROCEDURE DATE:  10/30/2011 PROCEDURE:  Colonoscopy 86578 ASA CLASS:  Class II INDICATIONS:  Elevated Risk Screening, brother had colon cancer MEDICATIONS:   Fentanyl 75 mcg IV, These medications were titrated to patient response per physician's verbal order, Versed 6 mg IV  DESCRIPTION OF PROCEDURE:   After the risks benefits and alternatives of the procedure were thoroughly explained, informed consent was obtained.  Digital rectal exam was performed and revealed no rectal masses.   The LB [id number] endoscope was introduced through the anus and advanced to the cecum, which was identified by both the appendix and ileocecal valve, without limitations.  The quality of the prep was good..  The instrument was then slowly withdrawn as the colon was fully examined. <<PROCEDUREIMAGES>> FINDINGS:  Mild diverticulosis was found in the sigmoid to descending colon segments (see image1).  This was otherwise a normal examination of the colon (see image2, image3, and image4). Retroflexed views in the rectum revealed no abnormalities. COMPLICATIONS:  None  ENDOSCOPIC IMPRESSION: 1) Mild diverticulosis in the sigmoid to descending colon segments 2) Otherwise normal examination  RECOMMENDATIONS: 1) Given your significant family history of colon cancer, you should have a repeat colonoscopy in 5 years  REPEAT EXAM:  5 years  ______________________________ Rachael Fee, MD  n. eSIGNED:   Rachael Fee at 10/30/2011 03:39 PM  Zollie Scale, [id number]

## 2011-10-30 NOTE — Progress Notes (Signed)
Patient did not experience any of the following events: a burn prior to discharge; a fall within the facility; wrong site/side/patient/procedure/implant event; or a hospital transfer or hospital admission upon discharge from the facility. (G8907) Patient did not have preoperative order for IV antibiotic SSI prophylaxis. (G8918)  

## 2011-10-30 NOTE — Op Note (Signed)
[facility] [address] [place], [place]  [address]  ENDOSCOPY PROCEDURE REPORT  PATIENT:  Jenny Madden, Jenny Madden  MR#:  [id number] BIRTHDATE:  18-Jun-1941, 70 yrs. old  GENDER:  female ENDOSCOPIST:  Rachael Fee, MD PROCEDURE DATE:  10/30/2011 PROCEDURE:  EGD with balloon dilatation ASA CLASS:  Class II INDICATIONS:  dyphagia, previous EGD dilation of Schatzki's ring (2012, 2010) MEDICATIONS:  There was residual sedation effect present from prior procedure., These medications were titrated to patient response per physician's verbal order, Versed 2 mg IV TOPICAL ANESTHETIC:  Cetacaine Spray  DESCRIPTION OF PROCEDURE:   After the risks benefits and alternatives of the procedure were thoroughly explained, informed consent was obtained.  The LB [id number]-H180 [id number] endoscope was introduced through the mouth and advanced to the second portion of the duodenum, without limitations.  The instrument was slowly withdrawn as the mucosa was fully examined. <<PROCEDUREIMAGES>> A Schatzki's ring was found. This was dilated with a 20mm CRE TTS balloon held inflated for 1 minute (see image4 and image5). Otherwise the examination was normal (see image2 and image1).  A hiatal hernia was found. This was 1-2cm (see image3).  Retroflexed views revealed no abnormalities.    The scope was then withdrawn from the patient and the procedure completed. COMPLICATIONS:  None  ENDOSCOPIC IMPRESSION: 1) Schatzki's ring, dilated with 20mm balloon 2) Small hiatal hernia 3) Otherwise normal examination  RECOMMENDATIONS: Chew your food well, eat slowly. Can repeat dilation if needed.  ______________________________ Rachael Fee, MD  n. eSIGNED:   Rachael Fee at 10/30/2011 03:54 PM  Zollie Scale, [phone]

## 2011-10-31 ENCOUNTER — Telehealth: Payer: Self-pay | Admitting: *Deleted

## 2011-10-31 NOTE — Telephone Encounter (Signed)

## 2011-12-27 ENCOUNTER — Encounter: Payer: Self-pay | Admitting: Pulmonary Disease

## 2011-12-27 ENCOUNTER — Ambulatory Visit (INDEPENDENT_AMBULATORY_CARE_PROVIDER_SITE_OTHER): Payer: Medicare Other | Admitting: Pulmonary Disease

## 2011-12-27 ENCOUNTER — Other Ambulatory Visit (INDEPENDENT_AMBULATORY_CARE_PROVIDER_SITE_OTHER): Payer: Medicare Other

## 2011-12-27 VITALS — BP 112/60 | HR 75 | Temp 98.1°F | Ht 64.0 in | Wt 174.2 lb

## 2011-12-27 DIAGNOSIS — F411 Generalized anxiety disorder: Secondary | ICD-10-CM

## 2011-12-27 DIAGNOSIS — D126 Benign neoplasm of colon, unspecified: Secondary | ICD-10-CM

## 2011-12-27 DIAGNOSIS — I10 Essential (primary) hypertension: Secondary | ICD-10-CM

## 2011-12-27 DIAGNOSIS — K5730 Diverticulosis of large intestine without perforation or abscess without bleeding: Secondary | ICD-10-CM

## 2011-12-27 DIAGNOSIS — M899 Disorder of bone, unspecified: Secondary | ICD-10-CM

## 2011-12-27 DIAGNOSIS — E7800 Pure hypercholesterolemia, unspecified: Secondary | ICD-10-CM

## 2011-12-27 DIAGNOSIS — M8580 Other specified disorders of bone density and structure, unspecified site: Secondary | ICD-10-CM

## 2011-12-27 DIAGNOSIS — M159 Polyosteoarthritis, unspecified: Secondary | ICD-10-CM

## 2011-12-27 DIAGNOSIS — E119 Type 2 diabetes mellitus without complications: Secondary | ICD-10-CM

## 2011-12-27 DIAGNOSIS — K219 Gastro-esophageal reflux disease without esophagitis: Secondary | ICD-10-CM

## 2011-12-27 DIAGNOSIS — E559 Vitamin D deficiency, unspecified: Secondary | ICD-10-CM | POA: Insufficient documentation

## 2011-12-27 HISTORY — DX: Other specified disorders of bone density and structure, unspecified site: M85.80

## 2011-12-27 HISTORY — DX: Vitamin D deficiency, unspecified: E55.9

## 2011-12-27 LAB — BASIC METABOLIC PANEL
BUN: 13 mg/dL (ref 6–23)
CO2: 27 mEq/L (ref 19–32)
GFR: 89.54 mL/min (ref >60.00)
Glucose, Bld: 99 mg/dL (ref 70–99)
Potassium: 4.2 mEq/L (ref 3.5–5.1)
Sodium: 138 mEq/L (ref 135–145)

## 2011-12-27 LAB — HEPATIC FUNCTION PANEL
ALT: 18 U/L (ref 0–35)
AST: 22 U/L (ref 0–37)
Albumin: 4.3 g/dL (ref 3.5–5.2)
Alkaline Phosphatase: 64 U/L (ref 39–117)
Total Protein: 7.8 g/dL (ref 6.0–8.3)

## 2011-12-27 LAB — CBC WITH DIFFERENTIAL/PLATELET
Basophils Absolute: 0 10*3/uL (ref 0.0–0.1)
Eosinophils Relative: 0.4 % (ref 0.0–5.0)
Lymphocytes Relative: 43.9 % (ref 12.0–46.0)
Lymphs Abs: 3 10*3/uL (ref 0.7–4.0)
Monocytes Relative: 5.2 % (ref 3.0–12.0)
Neutrophils Relative %: 50.2 % (ref 43.0–77.0)
Platelets: 247 10*3/uL (ref 150.0–400.0)
RDW: 13.5 % (ref 11.5–14.6)
WBC: 6.8 10*3/uL (ref 4.5–10.5)

## 2011-12-27 LAB — LIPID PANEL
Cholesterol: 167 mg/dL (ref 0–200)
VLDL: 13.6 mg/dL (ref 0.0–40.0)

## 2011-12-27 LAB — MICROALBUMIN / CREATININE URINE RATIO: Microalb Creat Ratio: 0.3 mg/g (ref 0.0–30.0)

## 2011-12-27 MED ORDER — GLUCOSE BLOOD VI STRP: ORAL_STRIP | Status: DC

## 2011-12-27 NOTE — Progress Notes (Signed)
Addended by: Tommie Sams on: 12/27/2011 11:05 AM   Modules accepted: Orders

## 2011-12-27 NOTE — Progress Notes (Addendum)
Subjective:    Patient ID: Jenny Madden, female    DOB: 1941-08-09, 71 y.o.   MRN: [id number]  HPI 71 y/o BF here for a follow up visit and on-going management of mult med problems including HBP;  Abn 2DEcho;  Hyperchol;  DM w/ good control;  GERD/ Divertics/ Colon Polyps;  DJD/ Vit D defic;  Hx HAs &  Tinnitus;  Anxiety...  ~  December 27, 2010:  41mo ROV & stable overall> she notes some left hip & leg discomfort recently due to an exercise program, ?muscle spasm & we discussed trial rest/ heat/ Robaxin & grad ret to exercise;  Next step for further eval = Ortho check...    HBP>  Controlled on Amlodipine w/ BP= 138/62 today & denies CP, palpit, cerebral ischemic symptoms, SOB, edema, etc...    Chol>  Stable on Simva40 w/ TChol 167, TG 51, HDL 52, LDL 105;  She requests ch to generic LIPITOR 40mg /d- OK...    DM>  Stable on Metform500Bid w/ FBS=91 & A1c= ?not done;  REC to get wt down & cut back Metformin to 500mg  QAM only...    GI>  She has GERD, sm HH, schatzki ring (balloon dil 2/10 by DrJacobs), +Divertics, colon polyps;  Stable on Omep?20mg  daily, continue same.    Ortho>  As above + rec to take Caltrate, MVI, Vit D 2000u daily...  ~  June 28, 2011:  41mo ROV & she reports that he never switched to Atorvastatin due to it's cost (still on Simva40; weight is unchanged at 176# not really on a diet "I try to eat right"    HBP>  Controlled on Amlodipine w/ BP= 126/68 today & denies CP, palpit, cerebral ischemic symptoms, SOB, edema, etc...    Chol>  Stable on Simva40 w/ TChol 160, TG 59, HDL 52, LDL 96;  Continue same + low chol/ low fat diet...    DM>  Stable on Metform500Bid w/ FBS=107 & A1c=6.2;  She saw DrHecker 4/12 w/ mild DM retinopathy noted; BS at home are all good 90-110 range.    GI>  She has GERD, sm HH, schatzki ring (balloon dil 2/12 by DrJacobs), +Divertics, colon polyps;  Stable on Omep?20mg  PRN, denies pain or dysphagia,contin same.    Ortho>  C/o some left shoulder pain for  which she saw DrWeingold- given shot (helped some) & PT (helped but too $$); rec to take Caltrate, MVI, Vit D 2000u daily...  ~  December 27, 2011:  41mo ROV & Jenny Madden states she has been doing well, no new complaints or concerns, "I have lots of energy"...    HBP>  Controlled on Amlodip10 w/ BP= 112/60 today & denies CP, palpit, cerebral ischemic symptoms, SOB, edema, etc...    Chol>  Stable on Simva40 w/ TChol 167, TG 68, HDL 55, LDL 98;  Continue same + low chol/ low fat diet...    DM>  Stable on Metform500Bid w/ FBS=99 & A1c=6.3;  She saw DrHecker 4/12 w/ mild DM retinopathy noted; BS at home are all good 90-110 range.    GI>  She has GERD, sm HH, schatzki ring (balloon dil 2/10 by DrJacobs), +Divertics, colon polyps; she saw DrJacobs 1/13 w/ recurrent dysphagia complaints 7 he did a third EGD w/ Schatzki ring redemonstarted & balloon dilatation performed; she also had colonoscopy mild divertics, otherw neg...    Ortho>  C/o some left shoulder pain for which she saw DrWeingold- given shot (helped some) & PT (helped but too $$);  rec to take Caltrate, MVI, Vit D 2000u daily; she is overdue to BMD & we discussed poss Reclast during the OV today, plan is to check the BMD first... LABS 3/13:  FLP- at goals on simva40;  Chems- ok w/ BS=99 A1c=6.3 on MetformBid;  CBC- wnl;  TSH=1.36;  Umicroalb= neg...          Problem List:  ALLERGIC RHINITIS (ICD-477.9) - she uses Allegra Prn...  HYPERTENSION (ICD-401.9) - controlled on NORVASC 10mg /d and takes ASA 81mg /d as well...   ~  9/12: BP= 134/82 & similar at home; denies HA, fatigue, visual changes, CP, palipit, dizziness, syncope, dyspnea, edema, etc... ~  3/13: BP= 112/60 7 she remains asymptomatic...  ABNORMAL ELECTROCARDIOGRAM (ICD-794.31) & ECHOCARDIOGRAM, ABNORMAL (ICD-793.2) - she is asymtomatic without CP, palpit, SOB, dizziness or syncope... she is active w/ walking, treadmill, etc... ~  baseline EKG showed NSR, poor R progression V1-3 w/ NSSTTWA...  no change in f/u tracings. ~  2DEcho in 2006 showed only some mild SAM, tr MR, tr AI, vigorous LVF w/ sm cavity & EF=75% (no change from 2002)... ~  2DEcho 2/09 showed:  Mod thickening of the MV leaflets, & mild calcif of the AoV... The left ventricle was mildly dilated. Overall left ventricular systolic function was normal. Left ventricular ejection fraction was estimated to be 60 %. There were no left ventricular regional wall motion abnormalities. Left ventricular wall thickness was mildly increased. There was mild focal basal septal hypertrophy. There was no systolic anterior motion of the mitral valve. There was Doppler evidence for dynamic left ventricular mid-cavity obstruction during Valsalva, with a peak velocity of 2.1 m/sec , and with a peak gradient of 18 mmHg. Doppler parameters were consistent with abnormal left ventricular relaxation.  HYPERCHOLESTEROLEMIA (ICD-272.0) - on SIMVASTATIN 40mg /d + diet efforts. ~  FLP 7/08 showed TChol 165, Tg 65, HDL 45, LDL 107. ~  FLP 8/09 showed TChol 144, TG 63, HDL 52, LDL 80 ~  FLP 9/10 showed TChol 158, TG 60, HDL 47, LDL 99 ~  FLP 3/11 showed TChol 162, TG 63, HDL 53, LDL 97 ~  FLP 9/11 showed TChol 169, TG 58, HDL 45, LDL 113 ~  FLP 3/12 on Simva40 showed TChol 167, TG 51, HDL 52, LDL 105 ~  FLP 9/12 on Simva40 showed TChol 160, TG 59, HDL 52, LDL 96 ~  FLP 3/13 on Simva40 showed TChol 167, TG 68, HDL 55, LDL 98  DM (ICD-250.00) - doing well on METFORMIN 500mg Bid + diet... BS at home all ~100-115 per pt. ~  labs 7/08 showed BS=111, HgA1c=6.1 ~  labs 2.09 showed BS= 112, HgA1c= 6.3.Marland KitchenMarland Kitchen continue same. ~  labs 8/09 showed BS= 111, HgA1c= 6.4 ~  labs 9/10 showed BS= 106, A1c= not done. ~  labs 3/11 showed BS= 93, A1c= 6.1 ~  labs 9/11 showed BS= 95 ~  Labs 9/12 showed BS= 107, A1c= 6.2 ~  Labs 3/13 showed BS= 99, A1c= 6.3  GERD (ICD-530.81) - she had EGD in 2002 showing 2cm HH, gastritis, duodenitis... controlled on OMEPRAZOLE 20mg /d... ~   EGD 2/10 by DrJacobs showed HH/ Schatzki ring w/ balloon dilatation... ~  EGD 2/12 by DrJacobs w/ Schatzki ring redomonstarted & another dilatation performed... ~  EGD 1/13 by DrJacobs w/ sm HH & Schatzki ring dilated once again...  DIVERTICULOSIS OF COLON (ICD-562.10) & COLONIC POLYPS (ICD-211.3) -  ~  last colonoscopy was 1/08 by DrSam showing divertics only... f/u 54yrs planned due to [facility] w/ colon  Ca... ~  Colonoscopy 1/13 by DrJacobs showed mild divertics, otherw neg...  DEGENERATIVE JOINT DISEASE, GENERALIZED (ICD-715.00)  OSTEOPOROSIS >> supposed to be on Calcium, MVI, Vit D 2000u daily... ~  BMD 10/10 by DrMezer at [facility] showed TScore -3.3 in L2 but we don't have full report... ~  3/13:  She is overdue for f/u BMD & says she will consider Reclast Rx....  VIT D DEFICIENCY - Vit D level 9/11= 23... rec to start 2000 u OTC Vit D supplement daily...  HEADACHES, HX OF (ICD-V13.8) - CTBrain in 2007 w/ some atrophy.  Hx of TINNITUS (ICD-388.30)  ANXIETY (ICD-300.00) - on ALPRAZOLAM 0.5mg  Prn...  Health Maintenance: ~  GI= DrJacobs as above... ~  GYN= sees DrMezer since DrFore passed away- PAP, Mammogram & BMD thru his office. ~  Immunizations:  yearly flu shots... PNEUMOVAX 9/10 at age 51... TDAP given 3/11...   Past Surgical History  Procedure Date  . Cesarean section   . Foot surgery     bilateral for hammer toes    Outpatient Encounter Prescriptions as of 12/27/2011  Medication Sig Dispense Refill  . ALPRAZolam (XANAX) 0.5 MG tablet Take 1/2 to 1 whole tablet by mouth three times daily as needed for nerves       . amLODipine (NORVASC) 10 MG tablet Take 1 tablet (10 mg total) by mouth daily.  90 tablet  3  . aspirin 81 MG tablet Take by mouth daily.        . Blood Glucose Calibration (ACCU-CHEK AVIVA) SOLN Use as directed to monitor blood glucose       . Blood Glucose Monitoring Suppl (ACCU-CHEK ACTIVE CARE KIT) KIT Use as directed to check blood glucose       .  Cholecalciferol (VITAMIN D) 2000 UNITS CAPS Take 1 capsule by mouth daily.        . fexofenadine (ALLEGRA) 180 MG tablet Take 1 tablet (180 mg total) by mouth daily. Take 1 tablet by mouth once daily as needed for allergies  90 tablet  3  . glucose blood (ACCU-CHEK AVIVA) test strip Use as directed to check blood glucose  100 each  3  . metFORMIN (GLUCOPHAGE) 500 MG tablet Take 1 tablet (500 mg total) by mouth 2 (two) times daily with a meal.  180 tablet  3  . omeprazole (PRILOSEC) 20 MG capsule Take 20 mg by mouth daily as needed.      Marland Kitchen DISCONTD: omeprazole (PRILOSEC) 20 MG capsule Take 1 capsule (20 mg total) by mouth daily.  90 capsule  3  . simvastatin (ZOCOR) 40 MG tablet Take 1 tablet (40 mg total) by mouth every evening.  90 tablet  3  . DISCONTD: Alum & Mag Hydroxide-Simeth (MAGIC MOUTHWASH) SOLN 1 teaspoon gargle and swallow four times a day         No Known Allergies   Current Medications, Allergies, Past Medical History, Past Surgical History, Family History, and Social History were reviewed in [employer] record.    Review of Systems   Constitutional:  Denies F/C/S, anorexia, unexpected weight change. HEENT:  No HA, visual changes, earache, sore throat, hoarseness; notes allergy symptoms. Resp:  No cough, sputum, hemoptysis; no SOB, tightness, wheezing. Cardio:  No CP, ch in DOE, orthopnea, edema; notes occas palpit- "it's gas" she says. GI:  Denies N/V/D/C or blood in stool; no reflux, abd pain, distention. GU:  No dysuria, freq, urgency, hematuria, or flank pain. MS:  Notes pain left post  hip/ thigh related to exerc; good ROM, no rash, no swelling, etc. Neuro:  No tremors, seizures, dizziness, syncope, weakness, numbness, gait abn. Skin:  No suspicious lesions or skin rash. Heme:  No adenopathy, bruising, bleeding. Psyche: Denies confusion, sleep disturbance, hallucinations, anxiety, depression.    Objective:   Physical Exam   WD, WN, 71 y/o BF  in NAD... Vital Signs:  Reviewed... BP= 138/62 General:  Alert & oriented; pleasant & cooperative... HEENT:  [place]/AT, EOM-wnl, PERRLA, EACs-clear, TMs-wnl, NOSE-clear, THROAT-clear & wnl. Neck:  Supple w/ fair ROM; no JVD; normal carotid impulses w/o bruits; no thyromegaly or nodules palpated; no lymphadenopathy. Chest:  Clear to P & A; without wheezes/ rales/ or rhonchi heard... Heart:  Regular Rhythm; norm S1 & S2 without murmurs/ rubs/ or gallops detected... Abdomen:  Soft & nontender; normal bowel sounds; no organomegaly or masses palpated... Ext:  without deformities, mild arthritic changes; no varicose veins, +venous insuffic, no edema;  Neuro:  CNs intact; motor testing normal; sensory testing normal; gait normal & balance OK... Derm:  No lesions noted; no rash etc... Lymph:  No cervical, supraclavicular, axillary, or inguinal adenopathy palpated...   RADIOLOGY DATA:  Reviewed in the EPIC EMR & discussed w/ the patient...  LABORATORY DATA:  Reviewed in the EPIC EMR & discussed w/ the patient...    >>LABS 3/13:  FLP- at goals on simva40;  Chems- ok w/ BS=99 A1c=6.3 on MetformBid;  CBC- wnl;  TSH=1.36;  Umicroalb= neg...    Assessment & Plan:   HBP>  Controlled on Amlodipine monotherapy; continue same + diet, exercise, keep wt down...  ABN EKG & ECHO>  SEE ABOVE, on ASA & Amlodipine, continue same, stay active etc...  HYPERCHOL>  Stable on Simva40 w/ FLP looking good, but needs better diet & some wt reduction...  DM>  Stable on the Metformin monotherapy, we reviewed diet as well...  GERD>  She denies reflux or dysphagia; uses the Omep 20mg  as needed...  Divertics, Polyps>  Her brother had colon cancer; due for 3yr f/u colon 1/13...  DJD>  She knows to minimize any NSAIDs 7 use Tylenol etc; she has f/u DrWeingold for her left shoulder discomfort...  Osteoporosis>  BMD is pending & she will consider Reclast...  Vit D Defic>  On OTC Vit D supplementation...  Anxiety>  prn  Alprazolam as needed...   Patient's Medications  New Prescriptions   No medications on file  Previous Medications   ALPRAZOLAM (XANAX) 0.5 MG TABLET    Take 1/2 to 1 whole tablet by mouth three times daily as needed for nerves    AMLODIPINE (NORVASC) 10 MG TABLET    Take 1 tablet (10 mg total) by mouth daily.   ASPIRIN 81 MG TABLET    Take by mouth daily.     BLOOD GLUCOSE CALIBRATION (ACCU-CHEK AVIVA) SOLN    Use as directed to monitor blood glucose    BLOOD GLUCOSE MONITORING SUPPL (ACCU-CHEK ACTIVE CARE KIT) KIT    Use as directed to check blood glucose    CHOLECALCIFEROL (VITAMIN D) 2000 UNITS CAPS    Take 1 capsule by mouth daily.     FEXOFENADINE (ALLEGRA) 180 MG TABLET    Take 1 tablet (180 mg total) by mouth daily. Take 1 tablet by mouth once daily as needed for allergies   METFORMIN (GLUCOPHAGE) 500 MG TABLET    Take 1 tablet (500 mg total) by mouth 2 (two) times daily with a meal.   SIMVASTATIN (ZOCOR) 40 MG TABLET  Take 1 tablet (40 mg total) by mouth every evening.  Modified Medications   Modified Medication Previous Medication   GLUCOSE BLOOD (ACCU-CHEK AVIVA) TEST STRIP glucose blood (ACCU-CHEK AVIVA) test strip      Use as directed to check blood glucose    Use as directed to check blood glucose   OMEPRAZOLE (PRILOSEC) 20 MG CAPSULE omeprazole (PRILOSEC) 20 MG capsule      Take 20 mg by mouth daily as needed.    Take 1 capsule (20 mg total) by mouth daily.  Discontinued Medications   ALUM & MAG HYDROXIDE-SIMETH (MAGIC MOUTHWASH) SOLN    1 teaspoon gargle and swallow four times a day

## 2011-12-27 NOTE — Patient Instructions (Signed)
Today we updated your med list in our EPIC system...    Continue your current medications the same...  Today we did your follow up FASTING blood work...    We will call you w/ the results when avail...  We will sched a Bone Density Test to compare to previous values...    We will call you w/ this result when available 7 we will discuss any recommended treatment...  Call for any questions...  Let's plan a follow up visit in 6 months, sooner if needed for problems.Marland KitchenMarland Kitchen

## 2012-01-02 ENCOUNTER — Ambulatory Visit (INDEPENDENT_AMBULATORY_CARE_PROVIDER_SITE_OTHER)
Admission: RE | Admit: 2012-01-02 | Discharge: 2012-01-02 | Disposition: A | Discharge status: Home / Self Care | Payer: Medicare Other | Source: Ambulatory Visit

## 2012-01-02 DIAGNOSIS — E559 Vitamin D deficiency, unspecified: Secondary | ICD-10-CM

## 2012-01-10 ENCOUNTER — Ambulatory Visit (INDEPENDENT_AMBULATORY_CARE_PROVIDER_SITE_OTHER): Payer: Medicare Other | Admitting: Gynecology

## 2012-01-10 ENCOUNTER — Encounter: Payer: Self-pay | Admitting: Gynecology

## 2012-01-10 VITALS — BP 120/70 | Ht 63.75 in | Wt 175.0 lb

## 2012-01-10 DIAGNOSIS — M949 Disorder of cartilage, unspecified: Secondary | ICD-10-CM

## 2012-01-10 DIAGNOSIS — M899 Disorder of bone, unspecified: Secondary | ICD-10-CM

## 2012-01-10 DIAGNOSIS — N952 Postmenopausal atrophic vaginitis: Secondary | ICD-10-CM

## 2012-01-10 DIAGNOSIS — M8580 Other specified disorders of bone density and structure, unspecified site: Secondary | ICD-10-CM

## 2012-01-10 MED ORDER — ESTRADIOL 10 MCG VA TABS: 1.0000 | ORAL_TABLET | VAGINAL | Status: DC

## 2012-01-10 NOTE — Progress Notes (Signed)
Jenny Madden 1940-12-25 [id number]   History:    71 y.o.  who is new to the practice. Patient with complaints of vaginal dryness and irritation. She is on normal replacement therapy. She's been followed by her internist Dr. Alroy Dust (hypertension, hyperlipidemia, type 2 diabetes) who has recently done all her lab work. I did review some of her records from her previous gynecologist and it appears that she had a bone density study done at [facility] hospital on October of 2010. That was patient's first bone density study. Her lowest T score was at the [facility] with a value -2.3. It appears that patient did have esophageal stricture history has not been able to tolerate oral bisphosphonates in the past. She informed me that last week she had a bone density study and will follow up with her internist to discuss the results. She would be an ideal candidate for Reclast IV once a year. She states that she is taking vitamin D 2000 units daily. She has a past history of colon polyps and her last colonoscopy was this year along with an EGD which patient states was normal. Her last Pap smear was in April 2012. Her last mammogram was in October 2012 as she does her monthly self breast examination.   Past medical history,surgical history, family history and social history were all reviewed and documented in the EPIC chart.  Gynecologic History No LMP recorded. Patient is postmenopausal. Contraception: none Last Pap: 2012. Results were: normal Last mammogram: 2012. Results were: normal  Obstetric History OB History    Grav Para Term Preterm Abortions TAB SAB Ect Mult Living   3 1 1  2     1      # Outc Date GA Lbr Len/2nd Wgt Sex Del Anes PTL Lv   1 TRM     F CS  No Yes   2 ABT            3 ABT                ROS:  Was performed and pertinent positives and negatives are included in the history.  Exam: chaperone present  BP 120/70  Ht 5' 3.75" (1.619 m)  Wt 175 lb (79.379 kg)  BMI 30.27  kg/m2  Body mass index is 30.27 kg/(m^2).  General appearance : Well developed well nourished female. No acute distress HEENT: Neck supple, trachea midline, no carotid bruits, no thyroidmegaly Lungs: Clear to auscultation, no rhonchi or wheezes, or rib retractions  Heart: Regular rate and rhythm, no murmurs or gallops Breast:Examined in sitting and supine position were symmetrical in appearance, no palpable masses or tenderness,  no skin retraction, no nipple inversion, no nipple discharge, no skin discoloration, no axillary or supraclavicular lymphadenopathy Abdomen: no palpable masses or tenderness, no rebound or guarding Extremities: no edema or skin discoloration or tenderness  Pelvic:  Bartholin, Urethra, Skene Glands: Within normal limits             Vagina: No gross lesions or discharge, vaginal atrophy  Cervix: No gross lesions or discharge, atrophic  Uterus  axial, normal size, shape and consistency, non-tender and mobile  Adnexa  Without masses or tenderness  Anus and perineum  normal   Rectovaginal  normal sphincter tone without palpated masses or tenderness             Hemoccult not done a colonoscopy 2 months ago     Assessment/Plan:  71 y.o. female with evidence of vaginal atrophy.  We discussed about placing her on a topical vaginal tablet such as the Vagifem (10 mcg) which she can apply twice a week. The risks benefits and pros and cons were discussed. There is a small risk of breast cancer with estrogen is but has been topical and a low dose there is very little if any systemic absorption. Patient will discuss with her internist her most recent bone density study to evaluate the trimmed and compare with previous study of 2010. Based on her age with flow bone mineral density evening of 9 the osteoporotic range she is a high risk of fracture and would be an ideal candidate for once a year intravenous bisphosphonate such as Reclast. We discussed today also the new screening  guidelines her Pap smears. Since she is over 72 years of age and has had no prior abnormal Pap smears she will no longer needs Pap smears. She will need her annual gynecological exam otherwise. She was encouraged to do her monthly self breast examination and to be engage in some form of weightbearing exercises 3 or 4 times a week for bone health.    Ok Edwards MD, 10:23 AM 01/10/2012

## 2012-01-10 NOTE — Patient Instructions (Addendum)
Apply the vagifem tablet sample I gave you one intravaginally for 6 nights in a row. After that apply one tablet vaginally twice a week thereafter. The prescription is in the pharmacy to pick up.

## 2012-01-11 ENCOUNTER — Telehealth: Payer: Self-pay | Admitting: *Deleted

## 2012-01-11 MED ORDER — NONFORMULARY OR COMPOUNDED ITEM: Status: DC

## 2012-01-11 NOTE — Telephone Encounter (Signed)
Tell her that we can offer her a generic estrogen cream that is prepared a [facility] on [address] in [place]. It will cost her $35-$39 for 90 days supply.  Estradiol 0.02% 1 mL pre-field applicator Sig; one application twice a week  90 day supply. 4 refills

## 2012-01-11 NOTE — Telephone Encounter (Signed)
Pt was seen on 01/10/12 and given rx for vagifem 10 mcg pt said that medication is $300 and too expensive for her. Pt would like something else. Please advise

## 2012-01-11 NOTE — Telephone Encounter (Signed)
Pt informed with the below note, rx called in 

## 2012-02-05 ENCOUNTER — Other Ambulatory Visit (INDEPENDENT_AMBULATORY_CARE_PROVIDER_SITE_OTHER): Payer: Medicare Other

## 2012-02-05 ENCOUNTER — Encounter: Payer: Self-pay | Admitting: Adult Health

## 2012-02-05 ENCOUNTER — Ambulatory Visit (INDEPENDENT_AMBULATORY_CARE_PROVIDER_SITE_OTHER): Payer: Medicare Other | Admitting: Adult Health

## 2012-02-05 VITALS — BP 132/82 | HR 94 | Temp 98.0°F | Ht 64.75 in | Wt 176.8 lb

## 2012-02-05 DIAGNOSIS — M810 Age-related osteoporosis without current pathological fracture: Secondary | ICD-10-CM

## 2012-02-05 DIAGNOSIS — M899 Disorder of bone, unspecified: Secondary | ICD-10-CM

## 2012-02-05 DIAGNOSIS — M8580 Other specified disorders of bone density and structure, unspecified site: Secondary | ICD-10-CM

## 2012-02-05 DIAGNOSIS — M949 Disorder of cartilage, unspecified: Secondary | ICD-10-CM

## 2012-02-05 LAB — BASIC METABOLIC PANEL
BUN: 11 mg/dL (ref 6–23)
CO2: 26 mEq/L (ref 19–32)
Calcium: 9.8 mg/dL (ref 8.4–10.5)
Chloride: 109 mEq/L (ref 96–112)
Creatinine, Ser: 1.1 mg/dL (ref 0.4–1.2)
GFR: 64.92 mL/min (ref >60.00)
Glucose, Bld: 85 mg/dL (ref 70–99)
Potassium: 4.8 mEq/L (ref 3.5–5.1)
Sodium: 142 mEq/L (ref 135–145)

## 2012-02-05 NOTE — Assessment & Plan Note (Signed)
Begin Reclast -paperwork began  Check bmet  Cont on oscal d and vit d  follow up Dr. Kriste Basque  As planned and .rpn

## 2012-02-05 NOTE — Progress Notes (Signed)
Subjective:    Patient ID: Jenny Madden, female    DOB: 09/11/1941, 71 y.o.   MRN: [id number]  HPI 71 y/o BF with known hx of  HBP;  Abn 2DEcho;  Hyperchol;  DM w/ good control;  GERD/ Divertics/ Colon Polyps;  DJD/ Vit D defic;  Hx HAs &  Tinnitus;  Anxiety...  ~  December 27, 2010:  23mo ROV & stable overall> she notes some left hip & leg discomfort recently due to an exercise program, ?muscle spasm & we discussed trial rest/ heat/ Robaxin & grad ret to exercise;  Next step for further eval = Ortho check...    HBP>  Controlled on Amlodipine w/ BP= 138/62 today & denies CP, palpit, cerebral ischemic symptoms, SOB, edema, etc...    Chol>  Stable on Simva40 w/ TChol 167, TG 51, HDL 52, LDL 105;  She requests ch to generic LIPITOR 40mg /d- OK...    DM>  Stable on Metform500Bid w/ FBS=91 & A1c= ?not done;  REC to get wt down & cut back Metformin to 500mg  QAM only...    GI>  She has GERD, sm HH, schatzki ring (balloon dil 2/10 by DrJacobs), +Divertics, colon polyps;  Stable on Omep?20mg  daily, continue same.    Ortho>  As above + rec to take Caltrate, MVI, Vit D 2000u daily...  ~  June 28, 2011:  23mo ROV & she reports that he never switched to Atorvastatin due to it's cost (still on Simva40; weight is unchanged at 176# not really on a diet "I try to eat right"    HBP>  Controlled on Amlodipine w/ BP= 126/68 today & denies CP, palpit, cerebral ischemic symptoms, SOB, edema, etc...    Chol>  Stable on Simva40 w/ TChol 160, TG 59, HDL 52, LDL 96;  Continue same + low chol/ low fat diet...    DM>  Stable on Metform500Bid w/ FBS=107 & A1c=6.2;  She saw DrHecker 4/12 w/ mild DM retinopathy noted; BS at home are all good 90-110 range.    GI>  She has GERD, sm HH, schatzki ring (balloon dil 2/12 by DrJacobs), +Divertics, colon polyps;  Stable on Omep?20mg  PRN, denies pain or dysphagia,contin same.    Ortho>  C/o some left shoulder pain for which she saw DrWeingold- given shot (helped some) & PT (helped  but too $$); rec to take Caltrate, MVI, Vit D 2000u daily...  ~  December 27, 2011:  23mo ROV & Jenny Madden states she has been doing well, no new complaints or concerns, "I have lots of energy"...    HBP>  Controlled on Amlodip10 w/ BP= 112/60 today & denies CP, palpit, cerebral ischemic symptoms, SOB, edema, etc...    Chol>  Stable on Simva40 w/ TChol 167, TG 68, HDL 55, LDL 98;  Continue same + low chol/ low fat diet...    DM>  Stable on Metform500Bid w/ FBS=99 & A1c=6.3;  She saw DrHecker 4/12 w/ mild DM retinopathy noted; BS at home are all good 90-110 range.    GI>  She has GERD, sm HH, schatzki ring (balloon dil 2/10 by DrJacobs), +Divertics, colon polyps; she saw DrJacobs 1/13 w/ recurrent dysphagia complaints 7 he did a third EGD w/ Schatzki ring redemonstarted & balloon dilatation performed; she also had colonoscopy mild divertics, otherw neg...    Ortho>  C/o some left shoulder pain for which she saw DrWeingold- given shot (helped some) & PT (helped but too $$); rec to take Caltrate, MVI, Vit D 2000u  daily; she is overdue to BMD & we discussed poss Reclast during the OV today, plan is to check the BMD first... LABS 3/13:  FLP- at goals on simva40;  Chems- ok w/ BS=99 A1c=6.3 on MetformBid;  CBC- wnl;  TSH=1.36;  Umicroalb= neg...  02/05/2012 Follow up Osteoporosis  Pt returns to discuss BMD BMD 01/02/12 >Tscore max -2.7 (lumbar) , -2.0 (hip). We discussed her results and need for therapy. She is taking oscal d and vitamin d . Last vitamin d was nml.  She is unable to take bisphosphnates due to GERD /esophageal disease .  We discussed Reclast , benefits, infusion and side effect profile.         Problem List:  ALLERGIC RHINITIS (ICD-477.9) - she uses Allegra Prn...  HYPERTENSION (ICD-401.9) - controlled on NORVASC 10mg /d and takes ASA 81mg /d as well...   ~  9/12: BP= 134/82 & similar at home; denies HA, fatigue, visual changes, CP, palipit, dizziness, syncope, dyspnea, edema, etc... ~  3/13:  BP= 112/60 7 she remains asymptomatic...  ABNORMAL ELECTROCARDIOGRAM (ICD-794.31) & ECHOCARDIOGRAM, ABNORMAL (ICD-793.2) - she is asymtomatic without CP, palpit, SOB, dizziness or syncope... she is active w/ walking, treadmill, etc... ~  baseline EKG showed NSR, poor R progression V1-3 w/ NSSTTWA... no change in f/u tracings. ~  2DEcho in 2006 showed only some mild SAM, tr MR, tr AI, vigorous LVF w/ sm cavity & EF=75% (no change from 2002)... ~  2DEcho 2/09 showed:  Mod thickening of the MV leaflets, & mild calcif of the AoV... The left ventricle was mildly dilated. Overall left ventricular systolic function was normal. Left ventricular ejection fraction was estimated to be 60 %. There were no left ventricular regional wall motion abnormalities. Left ventricular wall thickness was mildly increased. There was mild focal basal septal hypertrophy. There was no systolic anterior motion of the mitral valve. There was Doppler evidence for dynamic left ventricular mid-cavity obstruction during Valsalva, with a peak velocity of 2.1 m/sec , and with a peak gradient of 18 mmHg. Doppler parameters were consistent with abnormal left ventricular relaxation.  HYPERCHOLESTEROLEMIA (ICD-272.0) - on SIMVASTATIN 40mg /d + diet efforts. ~  FLP 7/08 showed TChol 165, Tg 65, HDL 45, LDL 107. ~  FLP 8/09 showed TChol 144, TG 63, HDL 52, LDL 80 ~  FLP 9/10 showed TChol 158, TG 60, HDL 47, LDL 99 ~  FLP 3/11 showed TChol 162, TG 63, HDL 53, LDL 97 ~  FLP 9/11 showed TChol 169, TG 58, HDL 45, LDL 113 ~  FLP 3/12 on Simva40 showed TChol 167, TG 51, HDL 52, LDL 105 ~  FLP 9/12 on Simva40 showed TChol 160, TG 59, HDL 52, LDL 96 ~  FLP 3/13 on Simva40 showed TChol 167, TG 68, HDL 55, LDL 98  DM (ICD-250.00) - doing well on METFORMIN 500mg Bid + diet... BS at home all ~100-115 per pt. ~  labs 7/08 showed BS=111, HgA1c=6.1 ~  labs 2.09 showed BS= 112, HgA1c= 6.3.Marland KitchenMarland Kitchen continue same. ~  labs 8/09 showed BS= 111, HgA1c= 6.4 ~  labs  9/10 showed BS= 106, A1c= not done. ~  labs 3/11 showed BS= 93, A1c= 6.1 ~  labs 9/11 showed BS= 95 ~  Labs 9/12 showed BS= 107, A1c= 6.2 ~  Labs 3/13 showed BS= 99, A1c= 6.3  GERD (ICD-530.81) - she had EGD in 2002 showing 2cm HH, gastritis, duodenitis... controlled on OMEPRAZOLE 20mg /d... ~  EGD 2/10 by DrJacobs showed HH/ Schatzki ring w/ balloon dilatation... ~  EGD 2/12 by DrJacobs w/ Schatzki ring redomonstarted & another dilatation performed... ~  EGD 1/13 by DrJacobs w/ sm HH & Schatzki ring dilated once again...  DIVERTICULOSIS OF COLON (ICD-562.10) & COLONIC POLYPS (ICD-211.3) -  ~  last colonoscopy was 1/08 by DrSam showing divertics only... f/u 60yrs planned due to [facility] w/ colon Ca... ~  Colonoscopy 1/13 by DrJacobs showed mild divertics, otherw neg...  DEGENERATIVE JOINT DISEASE, GENERALIZED (ICD-715.00)  OSTEOPOROSIS >> supposed to be on Calcium, MVI, Vit D 2000u daily... ~  BMD 10/10 [facility] DrMezer at [facility] showed TScore -3.3 in L2 but we don't have full report... ~  3/13:  She is overdue for f/u BMD & says she will consider Reclast Rx....  VIT D DEFICIENCY - Vit D level 9/11= 23... rec to start 2000 u OTC Vit D supplement daily...  HEADACHES, HX OF (ICD-V13.8) - CTBrain in 2007 w/ some atrophy.  Hx of TINNITUS (ICD-388.30)  ANXIETY (ICD-300.00) - on ALPRAZOLAM 0.5mg  Prn...  Health Maintenance: ~  GI= DrJacobs as above... ~  GYN= sees DrMezer since DrFore passed away- PAP, Mammogram & BMD thru his office. ~  Immunizations:  yearly flu shots... PNEUMOVAX 9/10 at age 47... TDAP given 3/11...   Past Surgical History  Procedure Date  . Cesarean section   . Foot surgery     bilateral for hammer toes    Outpatient Encounter Prescriptions as of 02/05/2012  Medication Sig Dispense Refill  . ALPRAZolam (XANAX) 0.5 MG tablet Take 1/2 to 1 whole tablet by mouth three times daily as needed for nerves       . amLODipine (NORVASC) 10 MG tablet Take 1 tablet (10 mg total)  by mouth daily.  90 tablet  3  . aspirin 81 MG tablet Take by mouth daily.        . Blood Glucose Calibration (ACCU-CHEK AVIVA) SOLN Use as directed to monitor blood glucose       . Blood Glucose Monitoring Suppl (ACCU-CHEK ACTIVE CARE KIT) KIT Use as directed to check blood glucose       . Cholecalciferol (VITAMIN D) 2000 UNITS CAPS Take 1 capsule by mouth daily.        . fexofenadine (ALLEGRA) 180 MG tablet Take 1 tablet (180 mg total) by mouth daily. Take 1 tablet by mouth once daily as needed for allergies  90 tablet  3  . glucose blood (ACCU-CHEK AVIVA) test strip Use as directed to check blood glucose  100 each  3  . metFORMIN (GLUCOPHAGE) 500 MG tablet Take 1 tablet (500 mg total) by mouth 2 (two) times daily with a meal.  180 tablet  3  . NONFORMULARY OR COMPOUNDED ITEM estradiol 0.02% 1 ml pre-filled applicator 1 applicator twice weekly #90  4 refills  90 each  4  . omeprazole (PRILOSEC) 20 MG capsule Take 20 mg by mouth daily as needed.      . simvastatin (ZOCOR) 40 MG tablet Take 1 tablet (40 mg total) by mouth every evening.  90 tablet  3    No Known Allergies   Current Medications, Allergies, Past Medical History, Past Surgical History, Family History, and Social History were reviewed in [employer] record.    Review of Systems Constitutional:   No  weight loss, night sweats,  Fevers, chills, fatigue, or  lassitude.  HEENT:   No headaches,  Difficulty swallowing,  Tooth/dental problems, or  Sore throat,  No sneezing, itching, ear ache, nasal congestion, post nasal drip,   CV:  No chest pain,  Orthopnea, PND, swelling in lower extremities, anasarca, dizziness, palpitations, syncope.   GI  No heartburn, indigestion, abdominal pain, nausea, vomiting, diarrhea, change in bowel habits, loss of appetite, bloody stools.   Resp: No shortness of breath with exertion or at rest.  No excess mucus, no productive cough,  No non-productive cough,   No coughing up of blood.  No change in color of mucus.  No wheezing.  No chest wall deformity  Skin: no rash or lesions.  GU: no dysuria, change in color of urine, no urgency or frequency.  No flank pain, no hematuria   MS:  No joint pain or swelling.  No decreased range of motion.    Psych:  No change in mood or affect. No depression or anxiety.  No memory loss.          Objective:   Physical Exam  GEN: A/Ox3; pleasant , NAD, well nourished   HEENT:  [place]/AT,  EACs-clear, TMs-wnl, NOSE-clear, THROAT-clear, no lesions, no postnasal drip or exudate noted.   NECK:  Supple w/ fair ROM; no JVD; normal carotid impulses w/o bruits; no thyromegaly or nodules palpated; no lymphadenopathy.  RESP  Clear  P & A; w/o, wheezes/ rales/ or rhonchi.no accessory muscle use, no dullness to percussion  CARD:  RRR, no m/r/g  , no peripheral edema, pulses intact, no cyanosis or clubbing.  GI:   Soft & nt; nml bowel sounds; no organomegaly or masses detected.  Musco: Warm bil, no deformities or joint swelling noted.   Neuro: alert, no focal deficits noted.    Skin: Warm, no lesions or rashes

## 2012-02-05 NOTE — Patient Instructions (Signed)
Continue on calcium with vitamin d Twice daily   Continue on Vitamin D daily  We are setting you up for Reclast infusion.  follow up as planned and As needed

## 2012-02-13 ENCOUNTER — Encounter: Payer: Self-pay | Admitting: Pulmonary Disease

## 2012-02-14 NOTE — Progress Notes (Signed)
Addended by: Boone Master E on: 02/14/2012 02:48 PM   Modules accepted: Orders

## 2012-02-27 ENCOUNTER — Telehealth: Payer: Self-pay | Admitting: Pulmonary Disease

## 2012-02-27 NOTE — Telephone Encounter (Signed)
Per TP: will do some research on reclast and call to discuss with patient.    Called spoke with patient, advised her that TP will research the reclast and call pt tomorrow.  Pt was okay with this and verbalized her understanding.  Will forward back to TP.

## 2012-02-27 NOTE — Telephone Encounter (Signed)
I spoke with pt and she stated she researched reclast online. She states she did not find anything good ppl had to say about it and "is afraid to put something in her body she can't take out". Pt is wanting to know if their are alternatives to this. Please advise SN thanks

## 2012-02-27 NOTE — Telephone Encounter (Signed)
Will forward message to TP per JJ.

## 2012-02-29 NOTE — Telephone Encounter (Signed)
LMOMTCB  Please advise her that it is her choice It is considered safe with good study results  We have many patients take with little to no side effects  Much easier to tolerate than oral bisphonates.  Ultimately it is her choice just let me know.

## 2012-02-29 NOTE — Telephone Encounter (Signed)
Pt returned call, advised of TP's recs regarding the reclast.  Pt reaffirmed that she does not want to have this infusion done because she "could not find anything on the internet where someone had good things to say about it" and that she "doesn't want to put anything in her body that she cannot take out."  Advised pt that I will inform TP of her decision and that if anything further is needed to please call our office and we will call her if there are any further recs.  Pt verbalized her understanding.   TP is aware of pt's decision thru verbal communication by myself.  Will sign off.

## 2012-03-05 ENCOUNTER — Encounter (HOSPITAL_COMMUNITY): Payer: Medicare Other

## 2012-05-02 ENCOUNTER — Other Ambulatory Visit: Payer: Self-pay | Admitting: Pulmonary Disease

## 2012-05-02 MED ORDER — SIMVASTATIN 40 MG PO TABS: 40.0000 mg | ORAL_TABLET | Freq: Every evening | ORAL | Status: DC

## 2012-05-02 NOTE — Telephone Encounter (Signed)
Faxed refill request received from [facility] for Simvastatin 40 mg; Take 1 tablet by mouth two times daily with a meal. Refill sent in

## 2012-05-07 ENCOUNTER — Other Ambulatory Visit: Payer: Self-pay | Admitting: *Deleted

## 2012-05-07 MED ORDER — AMLODIPINE BESYLATE 10 MG PO TABS: 10.0000 mg | ORAL_TABLET | Freq: Every day | ORAL | Status: DC

## 2012-05-07 MED ORDER — METFORMIN HCL 500 MG PO TABS: 500.0000 mg | ORAL_TABLET | Freq: Two times a day (BID) | ORAL | Status: DC

## 2012-05-23 ENCOUNTER — Telehealth: Payer: Self-pay | Admitting: Pulmonary Disease

## 2012-05-23 MED ORDER — LANCETS MISC: Status: DC

## 2012-05-23 MED ORDER — ACCU-CHEK SOFT TOUCH LANCETS MISC: Status: DC

## 2012-05-23 NOTE — Telephone Encounter (Signed)
Called and spoke with pt and she is aware of the rx for the lancets.  Pt is aware.

## 2012-06-10 ENCOUNTER — Other Ambulatory Visit: Payer: Self-pay | Admitting: Pulmonary Disease

## 2012-06-10 MED ORDER — ACCU-CHEK SOFTCLIX LANCET DEV MISC: Status: AC

## 2012-06-10 NOTE — Telephone Encounter (Signed)
Received refill request from [facility] for pt's Accu-chek softclix lancet device.  Pt last seen by TP 02-05-12 for new reclast start.  Has upcoming ov w/ SN 07-02-12.  Rx sent; MAR updated.

## 2012-06-12 ENCOUNTER — Telehealth: Payer: Self-pay | Admitting: Occupational Therapy

## 2012-06-12 MED ORDER — LANCING DEVICE MISC: Status: DC

## 2012-06-12 NOTE — Telephone Encounter (Signed)
Called spoke with patient who is requesting a new lancet device pen.  She verified that she did receive the lancets refilled 2 days ago.  Pt stated she uses the Accu-chek Softclix Pen.  Called [facility], spoke with Maurine Minister pharmacist.  Order given verbally for pt's lancet pen.  Per Maurine Minister, typically 1 pen is given; authorized 3 additional refills.  MAR updated.  Nothing further needed.

## 2012-06-27 ENCOUNTER — Other Ambulatory Visit: Payer: Self-pay | Admitting: Pulmonary Disease

## 2012-06-27 DIAGNOSIS — Z1231 Encounter for screening mammogram for malignant neoplasm of breast: Secondary | ICD-10-CM

## 2012-07-02 ENCOUNTER — Other Ambulatory Visit (INDEPENDENT_AMBULATORY_CARE_PROVIDER_SITE_OTHER): Payer: Medicare Other

## 2012-07-02 ENCOUNTER — Encounter: Payer: Self-pay | Admitting: Pulmonary Disease

## 2012-07-02 ENCOUNTER — Ambulatory Visit (INDEPENDENT_AMBULATORY_CARE_PROVIDER_SITE_OTHER): Payer: Medicare Other | Admitting: Pulmonary Disease

## 2012-07-02 VITALS — BP 120/62 | HR 80 | Temp 99.2°F | Ht 64.0 in | Wt 174.4 lb

## 2012-07-02 DIAGNOSIS — K219 Gastro-esophageal reflux disease without esophagitis: Secondary | ICD-10-CM

## 2012-07-02 DIAGNOSIS — E119 Type 2 diabetes mellitus without complications: Secondary | ICD-10-CM

## 2012-07-02 DIAGNOSIS — I10 Essential (primary) hypertension: Secondary | ICD-10-CM

## 2012-07-02 DIAGNOSIS — M159 Polyosteoarthritis, unspecified: Secondary | ICD-10-CM

## 2012-07-02 DIAGNOSIS — R9389 Abnormal findings on diagnostic imaging of other specified body structures: Secondary | ICD-10-CM

## 2012-07-02 DIAGNOSIS — M8580 Other specified disorders of bone density and structure, unspecified site: Secondary | ICD-10-CM

## 2012-07-02 DIAGNOSIS — D126 Benign neoplasm of colon, unspecified: Secondary | ICD-10-CM

## 2012-07-02 DIAGNOSIS — E7800 Pure hypercholesterolemia, unspecified: Secondary | ICD-10-CM

## 2012-07-02 DIAGNOSIS — F411 Generalized anxiety disorder: Secondary | ICD-10-CM

## 2012-07-02 DIAGNOSIS — K5730 Diverticulosis of large intestine without perforation or abscess without bleeding: Secondary | ICD-10-CM

## 2012-07-02 DIAGNOSIS — Z23 Encounter for immunization: Secondary | ICD-10-CM

## 2012-07-02 LAB — LIPID PANEL
Cholesterol: 177 mg/dL (ref 0–200)
HDL: 52.8 mg/dL (ref >39.00)
VLDL: 15 mg/dL (ref 0.0–40.0)

## 2012-07-02 LAB — BASIC METABOLIC PANEL
BUN: 11 mg/dL (ref 6–23)
Calcium: 9.8 mg/dL (ref 8.4–10.5)
GFR: 78.17 mL/min (ref >60.00)
Glucose, Bld: 101 mg/dL — ABNORMAL HIGH (ref 70–99)

## 2012-07-02 NOTE — Progress Notes (Signed)
Subjective:    Patient ID: Jenny Madden, female    DOB: 1941-08-09, 71 y.o.   MRN: [id number]  HPI 71 y/o BF here for a follow up visit and on-going management of mult med problems including HBP;  Abn 2DEcho;  Hyperchol;  DM w/ good control;  GERD/ Divertics/ Colon Polyps;  DJD/ Vit D defic;  Hx HAs &  Tinnitus;  Anxiety...  ~  December 27, 2010:  41mo ROV & stable overall> she notes some left hip & leg discomfort recently due to an exercise program, ?muscle spasm & we discussed trial rest/ heat/ Robaxin & grad ret to exercise;  Next step for further eval = Ortho check...    HBP>  Controlled on Amlodipine w/ BP= 138/62 today & denies CP, palpit, cerebral ischemic symptoms, SOB, edema, etc...    Chol>  Stable on Simva40 w/ TChol 167, TG 51, HDL 52, LDL 105;  She requests ch to generic LIPITOR 40mg /d- OK...    DM>  Stable on Metform500Bid w/ FBS=91 & A1c= ?not done;  REC to get wt down & cut back Metformin to 500mg  QAM only...    GI>  She has GERD, sm HH, schatzki ring (balloon dil 2/10 by DrJacobs), +Divertics, colon polyps;  Stable on Omep?20mg  daily, continue same.    Ortho>  As above + rec to take Caltrate, MVI, Vit D 2000u daily...  ~  June 28, 2011:  41mo ROV & she reports that he never switched to Atorvastatin due to it's cost (still on Simva40; weight is unchanged at 176# not really on a diet "I try to eat right"    HBP>  Controlled on Amlodipine w/ BP= 126/68 today & denies CP, palpit, cerebral ischemic symptoms, SOB, edema, etc...    Chol>  Stable on Simva40 w/ TChol 160, TG 59, HDL 52, LDL 96;  Continue same + low chol/ low fat diet...    DM>  Stable on Metform500Bid w/ FBS=107 & A1c=6.2;  She saw DrHecker 4/12 w/ mild DM retinopathy noted; BS at home are all good 90-110 range.    GI>  She has GERD, sm HH, schatzki ring (balloon dil 2/12 by DrJacobs), +Divertics, colon polyps;  Stable on Omep?20mg  PRN, denies pain or dysphagia,contin same.    Ortho>  C/o some left shoulder pain for  which she saw DrWeingold- given shot (helped some) & PT (helped but too $$); rec to take Caltrate, MVI, Vit D 2000u daily...  ~  December 27, 2011:  41mo ROV & Jenny Madden states she has been doing well, no new complaints or concerns, "I have lots of energy"...    HBP>  Controlled on Amlodip10 w/ BP= 112/60 today & denies CP, palpit, cerebral ischemic symptoms, SOB, edema, etc...    Chol>  Stable on Simva40 w/ TChol 167, TG 68, HDL 55, LDL 98;  Continue same + low chol/ low fat diet...    DM>  Stable on Metform500Bid w/ FBS=99 & A1c=6.3;  She saw DrHecker 4/12 w/ mild DM retinopathy noted; BS at home are all good 90-110 range.    GI>  She has GERD, sm HH, schatzki ring (balloon dil 2/10 by DrJacobs), +Divertics, colon polyps; she saw DrJacobs 1/13 w/ recurrent dysphagia complaints 7 he did a third EGD w/ Schatzki ring redemonstarted & balloon dilatation performed; she also had colonoscopy mild divertics, otherw neg...    Ortho>  C/o some left shoulder pain for which she saw DrWeingold- given shot (helped some) & PT (helped but too $$);  rec to take Caltrate, MVI, Vit D 2000u daily; she is overdue to BMD & we discussed poss Reclast during the OV today, plan is to check the BMD first... LABS 3/13:  FLP- at goals on simva40;  Chems- ok w/ BS=99 A1c=6.3 on MetformBid;  CBC- wnl;  TSH=1.36;  Umicroalb= neg...  ~  July 01, 2012:  38mo ROV & Jenny Madden is doing well overall & her only complaint is gas after milk products suggesting lactose intol & we discussed avoid & Lactaid enzyme as needed...  BP controlled on Norvasc;  FLP is ok on Simva40;  DM control is good on Metformin...     She had EGD & Colon 1/13>  EGD showed Schatzki's ring (dilated), and smHH measuring 1-2cm;  Colonoscopy showed mild diverticulosis, otherw neg...    We reviewed prob list, meds, xrays and labs> see below for updates >> OK Flu shot today... LABS 9/13>  FLP- wnl x LDL=109;  Chems- wnl x BS=101 A1c=6.3           Problem List:  ALLERGIC  RHINITIS (ICD-477.9) - she uses Allegra Prn...  HYPERTENSION (ICD-401.9) - controlled on NORVASC 10mg /d and takes ASA 81mg /d as well...   ~  CXR 9/11 showed normal heart size, clear lungs, scoliosis, NAD... ~  9/12:  BP= 134/82 & similar at home; denies HA, fatigue, visual changes, CP, palipit, dizziness, syncope, dyspnea, edema, etc... ~  3/13:  BP= 112/60 & she remains asymptomatic... ~  9/13:  BP= 120/62 & she denies CP, palpit, SOB, edema, etc...  ABNORMAL ELECTROCARDIOGRAM (ICD-794.31) & ECHOCARDIOGRAM, ABNORMAL (ICD-793.2) - she is asymtomatic without CP, palpit, SOB, dizziness or syncope... she is active w/ walking, treadmill, etc... ~  baseline EKG showed NSR, poor R progression V1-3 w/ NSSTTWA... no change in f/u tracings. ~  2DEcho in 2006 showed only some mild SAM, tr MR, tr AI, vigorous LVF w/ sm cavity & EF=75% (no change from 2002)... ~  2DEcho 2/09 showed:  Mod thickening of the MV leaflets, & mild calcif of the AoV... The left ventricle was mildly dilated. Overall left ventricular systolic function was normal. Left ventricular ejection fraction was estimated to be 60 %. There were no left ventricular regional wall motion abnormalities. Left ventricular wall thickness was mildly increased. There was mild focal basal septal hypertrophy. There was no systolic anterior motion of the mitral valve. There was Doppler evidence for dynamic left ventricular mid-cavity obstruction during Valsalva, with a peak velocity of 2.1 m/sec , and with a peak gradient of 18 mmHg. Doppler parameters were consistent with abnormal left ventricular relaxation.  HYPERCHOLESTEROLEMIA (ICD-272.0) - on SIMVASTATIN 40mg /d + diet efforts. ~  FLP 7/08 showed TChol 165, Tg 65, HDL 45, LDL 107. ~  FLP 8/09 showed TChol 144, TG 63, HDL 52, LDL 80 ~  FLP 9/10 showed TChol 158, TG 60, HDL 47, LDL 99 ~  FLP 3/11 showed TChol 162, TG 63, HDL 53, LDL 97 ~  FLP 9/11 showed TChol 169, TG 58, HDL 45, LDL 113 ~  FLP 3/12 on  Simva40 showed TChol 167, TG 51, HDL 52, LDL 105 ~  FLP 9/12 on Simva40 showed TChol 160, TG 59, HDL 52, LDL 96 ~  FLP 3/13 on Simva40 showed TChol 167, TG 68, HDL 55, LDL 98 ~  FLP 9/13 on Simva40 showed TChol 177, TG 75, HDL 53, LDL 109  DM (ICD-250.00) - doing well on METFORMIN 500mg Bid + diet... BS at home all ~100-115 per pt. ~  labs  7/08 showed BS=111, HgA1c=6.1 ~  labs 2.09 showed BS= 112, HgA1c= 6.3.Marland KitchenMarland Kitchen continue same. ~  labs 8/09 showed BS= 111, HgA1c= 6.4 ~  labs 9/10 showed BS= 106, A1c= not done. ~  labs 3/11 showed BS= 93, A1c= 6.1 ~  labs 9/11 showed BS= 95 ~  Labs 9/12 showed BS= 107, A1c= 6.2 ~  Labs 3/13 showed BS= 99, A1c= 6.3 ~  Ophthalmology check by DrHecker was pos for mild background retinopathy, stable... ~  Labs 9/13 on MetformBid showed BS= 101, A1c= 6.3  GERD (ICD-530.81) - she had EGD in 2002 showing 2cm HH, gastritis, duodenitis... controlled on OMEPRAZOLE 20mg /d... ~  EGD 2/10 by DrJacobs showed HH/ Schatzki ring w/ balloon dilatation... ~  EGD 2/12 by DrJacobs w/ Schatzki ring redomonstarted & another dilatation performed... ~  EGD 1/13 by DrJacobs w/ sm HH & Schatzki ring dilated once again...  DIVERTICULOSIS OF COLON (ICD-562.10) & COLONIC POLYPS (ICD-211.3) -  ~  last colonoscopy was 1/08 by DrSam showing divertics only... f/u 21yrs planned due to [facility] w/ colon Ca... ~  Colonoscopy 1/13 by DrJacobs showed mild divertics, otherw neg...  DEGENERATIVE JOINT DISEASE, GENERALIZED (ICD-715.00)  OSTEOPOROSIS >> supposed to be on Calcium, MVI, Vit D 2000u daily... She is INTOL to all bisphoshonates by mouth... ~  BMD 10/[facility]zer at [facility] showed TScore -3.3 in L2 but we don't have full report... ~  BMD 3/13 showed TScores -1.9 (-2.7 at L2) in Spine and -2.0 in left [facility]; she considered Reclast but decided against it....  VIT D DEFICIENCY - Vit D level 9/11= 23... rec to start 2000 u OTC Vit D supplement daily...  HEADACHES, HX OF (ICD-V13.8) -  CTBrain in 2007 w/ some atrophy.  Hx of TINNITUS (ICD-388.30)  ANXIETY (ICD-300.00) - on ALPRAZOLAM 0.5mg  Prn...  Health Maintenance: ~  GI= DrJacobs as above... ~  GYN= sees DrMezer/ Lily Peer since DrFore passed away- PAP, Mammogram 2023-08-16 by DrMezer- neg) & BMD thru his office. ~  Immunizations:  yearly flu shots... PNEUMOVAX 9/10 at age 86... TDAP given 3/11...   Past Surgical History  Procedure Date  . Cesarean section   . Foot surgery     bilateral for hammer toes    Outpatient Encounter Prescriptions as of 07/02/2012  Medication Sig Dispense Refill  . ALPRAZolam (XANAX) 0.5 MG tablet Take 1/2 to 1 whole tablet by mouth three times daily as needed for nerves       . amLODipine (NORVASC) 10 MG tablet Take 1 tablet (10 mg total) by mouth daily.  90 tablet  3  . aspirin 81 MG tablet Take by mouth daily.        . Blood Glucose Calibration (ACCU-CHEK AVIVA) SOLN Use as directed to monitor blood glucose       . Blood Glucose Monitoring Suppl (ACCU-CHEK ACTIVE CARE KIT) KIT Use as directed to check blood glucose       . Cholecalciferol (VITAMIN D) 2000 UNITS CAPS Take 1 capsule by mouth daily.        . fexofenadine (ALLEGRA) 180 MG tablet Take 1 tablet (180 mg total) by mouth daily. Take 1 tablet by mouth once daily as needed for allergies  90 tablet  3  . glucose blood (ACCU-CHEK AVIVA) test strip Use as directed to check blood glucose  100 each  3  . Lancet Devices (ACCU-CHEK SOFTCLIX) lancets Use as instructed  3 each  3  . Lancet Devices (LANCING DEVICE) MISC Use as directed to  check blood glucose  1 each  3  . metFORMIN (GLUCOPHAGE) 500 MG tablet Take 1 tablet (500 mg total) by mouth 2 (two) times daily with a meal.  180 tablet  3  . omeprazole (PRILOSEC) 20 MG capsule Take 20 mg by mouth daily as needed.      . simvastatin (ZOCOR) 40 MG tablet Take 1 tablet (40 mg total) by mouth every evening.  90 tablet  3  . DISCONTD: Lancets (ACCU-CHEK SOFT TOUCH) lancets Use as instructed   100 each  3  . DISCONTD: NONFORMULARY OR COMPOUNDED ITEM estradiol 0.02% 1 ml pre-filled applicator 1 applicator twice weekly #90  4 refills  90 each  4    No Known Allergies   Current Medications, Allergies, Past Medical History, Past Surgical History, Family History, and Social History were reviewed in [employer] record.    Review of Systems   Constitutional:  Denies F/C/S, anorexia, unexpected weight change. HEENT:  No HA, visual changes, earache, sore throat, hoarseness; notes allergy symptoms. Resp:  No cough, sputum, hemoptysis; no SOB, tightness, wheezing. Cardio:  No CP, ch in DOE, orthopnea, edema; notes occas palpit- "it's gas" she says. GI:  Denies N/V/D/C or blood in stool; no reflux, abd pain, distention. GU:  No dysuria, freq, urgency, hematuria, or flank pain. MS:  Notes pain left post hip/ thigh related to exerc; good ROM, no rash, no swelling, etc. Neuro:  No tremors, seizures, dizziness, syncope, weakness, numbness, gait abn. Skin:  No suspicious lesions or skin rash. Heme:  No adenopathy, bruising, bleeding. Psyche: Denies confusion, sleep disturbance, hallucinations, anxiety, depression.    Objective:   Physical Exam   WD, WN, 71 y/o BF in NAD... Vital Signs:  Reviewed... BP= 138/62 General:  Alert & oriented; pleasant & cooperative... HEENT:  [place]/AT, EOM-wnl, PERRLA, EACs-clear, TMs-wnl, NOSE-clear, THROAT-clear & wnl. Neck:  Supple w/ fair ROM; no JVD; normal carotid impulses w/o bruits; no thyromegaly or nodules palpated; no lymphadenopathy. Chest:  Clear to P & A; without wheezes/ rales/ or rhonchi heard... Heart:  Regular Rhythm; norm S1 & S2 without murmurs/ rubs/ or gallops detected... Abdomen:  Soft & nontender; normal bowel sounds; no organomegaly or masses palpated... Ext:  without deformities, mild arthritic changes; no varicose veins, +venous insuffic, no edema;  Neuro:  CNs intact; motor testing normal; sensory testing  normal; gait normal & balance OK... Derm:  No lesions noted; no rash etc... Lymph:  No cervical, supraclavicular, axillary, or inguinal adenopathy palpated...   RADIOLOGY DATA:  Reviewed in the EPIC EMR & discussed w/ the patient...  LABORATORY DATA:  Reviewed in the EPIC EMR & discussed w/ the patient...    >>LABS 3/13:  FLP- at goals on simva40;  Chems- ok w/ BS=99 A1c=6.3 on MetformBid;  CBC- wnl;  TSH=1.36;  Umicroalb= neg...    Assessment & Plan:    HBP>  Controlled on Amlodipine monotherapy; continue same + diet, exercise, keep wt down...  ABN EKG & ECHO>  SEE ABOVE, on ASA & Amlodipine, continue same, stay active etc...  HYPERCHOL>  Stable on Simva40 w/ FLP looking good, but needs better diet & some wt reduction...  DM>  Stable on the Metformin monotherapy, we reviewed diet as well...  GERD>  She denies reflux or dysphagia; uses the Omep 20mg  as needed...  Divertics, Polyps>  Her brother had colon cancer; due for 56yr f/u colon 1/13...  DJD>  She knows to minimize any NSAIDs & use Tylenol etc; she has  f/u DrWeingold for her left shoulder discomfort...  Osteoporosis>  BMD w/ osteopenia but she declined Reclast...  Vit D Defic>  On OTC Vit D supplementation...  Anxiety>  prn Alprazolam as needed...   Patient's Medications  New Prescriptions   No medications on file  Previous Medications   ALPRAZOLAM (XANAX) 0.5 MG TABLET    Take 1/2 to 1 whole tablet by mouth three times daily as needed for nerves    AMLODIPINE (NORVASC) 10 MG TABLET    Take 1 tablet (10 mg total) by mouth daily.   ASPIRIN 81 MG TABLET    Take by mouth daily.     BLOOD GLUCOSE CALIBRATION (ACCU-CHEK AVIVA) SOLN    Use as directed to monitor blood glucose    BLOOD GLUCOSE MONITORING SUPPL (ACCU-CHEK ACTIVE CARE KIT) KIT    Use as directed to check blood glucose    CHOLECALCIFEROL (VITAMIN D) 2000 UNITS CAPS    Take 1 capsule by mouth daily.     FEXOFENADINE (ALLEGRA) 180 MG TABLET    Take 1 tablet (180  mg total) by mouth daily. Take 1 tablet by mouth once daily as needed for allergies   GLUCOSE BLOOD (ACCU-CHEK AVIVA) TEST STRIP    Use as directed to check blood glucose   LANCET DEVICES (ACCU-CHEK SOFTCLIX) LANCETS    Use as instructed   LANCET DEVICES (LANCING DEVICE) MISC    Use as directed to check blood glucose   METFORMIN (GLUCOPHAGE) 500 MG TABLET    Take 1 tablet (500 mg total) by mouth 2 (two) times daily with a meal.   OMEPRAZOLE (PRILOSEC) 20 MG CAPSULE    Take 20 mg by mouth daily as needed.   SIMVASTATIN (ZOCOR) 40 MG TABLET    Take 1 tablet (40 mg total) by mouth every evening.  Modified Medications   No medications on file  Discontinued Medications   LANCETS (ACCU-CHEK SOFT TOUCH) LANCETS    Use as instructed   NONFORMULARY OR COMPOUNDED ITEM    estradiol 0.02% 1 ml pre-filled applicator 1 applicator twice weekly #90  4 refills

## 2012-07-02 NOTE — Patient Instructions (Addendum)
Today we updated your med list in our EPIC system...    Continue your current medications the same...  Today we did your follow up Lipid profile & DM labs...    We will call you w/ the results...  We gave you the 2013 Flu vaccine today...  Call for any problems...  Let's plan a follow up recheck in about 6 months.Marland KitchenMarland Kitchen

## 2012-07-24 ENCOUNTER — Ambulatory Visit (HOSPITAL_COMMUNITY)
Admission: RE | Admit: 2012-07-24 | Discharge: 2012-07-24 | Disposition: A | Discharge status: Home / Self Care | Payer: Medicare Other | Source: Ambulatory Visit | Attending: Pulmonary Disease | Admitting: Pulmonary Disease

## 2012-07-24 DIAGNOSIS — Z1231 Encounter for screening mammogram for malignant neoplasm of breast: Secondary | ICD-10-CM | POA: Insufficient documentation

## 2012-07-31 ENCOUNTER — Ambulatory Visit (INDEPENDENT_AMBULATORY_CARE_PROVIDER_SITE_OTHER): Payer: Medicare Other | Admitting: Adult Health

## 2012-07-31 ENCOUNTER — Other Ambulatory Visit (INDEPENDENT_AMBULATORY_CARE_PROVIDER_SITE_OTHER): Payer: Medicare Other

## 2012-07-31 ENCOUNTER — Encounter: Payer: Self-pay | Admitting: Adult Health

## 2012-07-31 VITALS — BP 132/64 | HR 87 | Temp 97.2°F | Ht 64.75 in | Wt 176.0 lb

## 2012-07-31 DIAGNOSIS — K5730 Diverticulosis of large intestine without perforation or abscess without bleeding: Secondary | ICD-10-CM

## 2012-07-31 DIAGNOSIS — R109 Unspecified abdominal pain: Secondary | ICD-10-CM

## 2012-07-31 LAB — URINALYSIS, ROUTINE W REFLEX MICROSCOPIC
Bilirubin Urine: NEGATIVE
Hgb urine dipstick: NEGATIVE
Ketones, ur: NEGATIVE
Leukocytes, UA: NEGATIVE
Nitrite: NEGATIVE
Urobilinogen, UA: 0.2 (ref 0.0–1.0)
pH: 6 (ref 5.0–8.0)

## 2012-07-31 LAB — HEPATIC FUNCTION PANEL
AST: 24 U/L (ref 0–37)
Albumin: 3.8 g/dL (ref 3.5–5.2)
Total Bilirubin: 0.5 mg/dL (ref 0.3–1.2)

## 2012-07-31 NOTE — Progress Notes (Signed)
Subjective:    Patient ID: Jenny Madden, female    DOB: 1940-11-19, 71 y.o.   MRN: [id number]  HPI 71 y/o BF  HBP;  Abn 2DEcho;  Hyperchol;  DM w/ good control;  GERD/ Divertics/ Colon Polyps;  DJD/ Vit D defic;  Hx HAs &  Tinnitus;  Anxiety...  ~  December 27, 2010:  29mo ROV & stable overall> Jenny Madden notes some left hip & leg discomfort recently due to an exercise program, ?muscle spasm & we discussed trial rest/ heat/ Robaxin & grad ret to exercise;  Next step for further eval = Ortho check...    HBP>  Controlled on Amlodipine w/ BP= 138/62 today & denies CP, palpit, cerebral ischemic symptoms, SOB, edema, etc...    Chol>  Stable on Simva40 w/ TChol 167, TG 51, HDL 52, LDL 105;  Jenny Madden requests ch to generic LIPITOR 40mg /d- OK...    DM>  Stable on Metform500Bid w/ FBS=91 & A1c= ?not done;  REC to get wt down & cut back Metformin to 500mg  QAM only...    GI>  Jenny Madden has GERD, sm HH, schatzki ring (balloon dil 2/10 by DrJacobs), +Divertics, colon polyps;  Stable on Omep?20mg  daily, continue same.    Ortho>  As above + rec to take Caltrate, MVI, Vit D 2000u daily...  ~  June 28, 2011:  29mo ROV & Jenny Madden reports that he never switched to Atorvastatin due to it's cost (still on Simva40; weight is unchanged at 176# not really on a diet "I try to eat right"    HBP>  Controlled on Amlodipine w/ BP= 126/68 today & denies CP, palpit, cerebral ischemic symptoms, SOB, edema, etc...    Chol>  Stable on Simva40 w/ TChol 160, TG 59, HDL 52, LDL 96;  Continue same + low chol/ low fat diet...    DM>  Stable on Metform500Bid w/ FBS=107 & A1c=6.2;  Jenny Madden saw DrHecker 4/12 w/ mild DM retinopathy noted; BS at home are all good 90-110 range.    GI>  Jenny Madden has GERD, sm HH, schatzki ring (balloon dil 2/12 by DrJacobs), +Divertics, colon polyps;  Stable on Omep?20mg  PRN, denies pain or dysphagia,contin same.    Ortho>  C/o some left shoulder pain for which Jenny Madden saw DrWeingold- given shot (helped some) & PT (helped but too $$); rec  to take Caltrate, MVI, Vit D 2000u daily...  ~  December 27, 2011:  29mo ROV & Jenny Madden states Jenny Madden has been doing well, no new complaints or concerns, "I have lots of energy"...    HBP>  Controlled on Amlodip10 w/ BP= 112/60 today & denies CP, palpit, cerebral ischemic symptoms, SOB, edema, etc...    Chol>  Stable on Simva40 w/ TChol 167, TG 68, HDL 55, LDL 98;  Continue same + low chol/ low fat diet...    DM>  Stable on Metform500Bid w/ FBS=99 & A1c=6.3;  Jenny Madden saw DrHecker 4/12 w/ mild DM retinopathy noted; BS at home are all good 90-110 range.    GI>  Jenny Madden has GERD, sm HH, schatzki ring (balloon dil 2/10 by DrJacobs), +Divertics, colon polyps; Jenny Madden saw DrJacobs 1/13 w/ recurrent dysphagia complaints 7 he did a third EGD w/ Schatzki ring redemonstarted & balloon dilatation performed; Jenny Madden also had colonoscopy mild divertics, otherw neg...    Ortho>  C/o some left shoulder pain for which Jenny Madden saw DrWeingold- given shot (helped some) & PT (helped but too $$); rec to take Caltrate, MVI, Vit D 2000u daily; Jenny Madden is overdue  to BMD & we discussed poss Reclast during the OV today, plan is to check the BMD first... LABS 3/13:  FLP- at goals on simva40;  Chems- ok w/ BS=99 A1c=6.3 on MetformBid;  CBC- wnl;  TSH=1.36;  Umicroalb= neg...  ~  July 01, 2012:  86mo ROV & Jenny Madden is doing well overall & her only complaint is gas after milk products suggesting lactose intol & we discussed avoid & Lactaid enzyme as needed...  BP controlled on Norvasc;  FLP is ok on Simva40;  DM control is good on Metformin...     Jenny Madden had EGD & Colon 1/13>  EGD showed Schatzki's ring (dilated), and smHH measuring 1-2cm;  Colonoscopy showed mild diverticulosis, otherw neg...    We reviewed prob list, meds, xrays and labs> see below for updates >> OK Flu shot today... LABS 9/13>  FLP- wnl x LDL=109;  Chems- wnl x BS=101 A1c=6.3  07/31/2012 Acute OV  Complains of mid  back discomfort in the mornings after eating with abdominal bloating and gas.   Tightness around ribs and back esp after eating.  Gas and bloating. Pressure is relieved with belching.  Some constipation, straining.   No back tenderness or positional pain. No rash. No bloody stools.  Some increased urinary urgency and frequency over last week.  No radicular symptoms.           Problem List:  ALLERGIC RHINITIS (ICD-477.9) - Jenny Madden uses Allegra Prn...  HYPERTENSION (ICD-401.9) - controlled on NORVASC 10mg /d and takes ASA 81mg /d as well...   ~  CXR 9/11 showed normal heart size, clear lungs, scoliosis, NAD... ~  9/12:  BP= 134/82 & similar at home; denies HA, fatigue, visual changes, CP, palipit, dizziness, syncope, dyspnea, edema, etc... ~  3/13:  BP= 112/60 & Jenny Madden remains asymptomatic... ~  9/13:  BP= 120/62 & Jenny Madden denies CP, palpit, SOB, edema, etc...  ABNORMAL ELECTROCARDIOGRAM (ICD-794.31) & ECHOCARDIOGRAM, ABNORMAL (ICD-793.2) - Jenny Madden is asymtomatic without CP, palpit, SOB, dizziness or syncope... Jenny Madden is active w/ walking, treadmill, etc... ~  baseline EKG showed NSR, poor R progression V1-3 w/ NSSTTWA... no change in f/u tracings. ~  2DEcho in 2006 showed only some mild SAM, tr MR, tr AI, vigorous LVF w/ sm cavity & EF=75% (no change from 2002)... ~  2DEcho 2/09 showed:  Mod thickening of the MV leaflets, & mild calcif of the AoV... The left ventricle was mildly dilated. Overall left ventricular systolic function was normal. Left ventricular ejection fraction was estimated to be 60 %. There were no left ventricular regional wall motion abnormalities. Left ventricular wall thickness was mildly increased. There was mild focal basal septal hypertrophy. There was no systolic anterior motion of the mitral valve. There was Doppler evidence for dynamic left ventricular mid-cavity obstruction during Valsalva, with a peak velocity of 2.1 m/sec , and with a peak gradient of 18 mmHg. Doppler parameters were consistent with abnormal left ventricular relaxation.  HYPERCHOLESTEROLEMIA  (ICD-272.0) - on SIMVASTATIN 40mg /d + diet efforts. ~  FLP 7/08 showed TChol 165, Tg 65, HDL 45, LDL 107. ~  FLP 8/09 showed TChol 144, TG 63, HDL 52, LDL 80 ~  FLP 9/10 showed TChol 158, TG 60, HDL 47, LDL 99 ~  FLP 3/11 showed TChol 162, TG 63, HDL 53, LDL 97 ~  FLP 9/11 showed TChol 169, TG 58, HDL 45, LDL 113 ~  FLP 3/12 on Simva40 showed TChol 167, TG 51, HDL 52, LDL 105 ~  FLP 9/12 on Simva40 showed TChol  160, TG 59, HDL 52, LDL 96 ~  FLP 3/13 on Simva40 showed TChol 167, TG 68, HDL 55, LDL 98 ~  FLP 9/13 on Simva40 showed TChol 177, TG 75, HDL 53, LDL 109  DM (ICD-250.00) - doing well on METFORMIN 500mg Bid + diet... BS at home all ~100-115 per pt. ~  labs 7/08 showed BS=111, HgA1c=6.1 ~  labs 2.09 showed BS= 112, HgA1c= 6.3.Marland KitchenMarland Kitchen continue same. ~  labs 8/09 showed BS= 111, HgA1c= 6.4 ~  labs 9/10 showed BS= 106, A1c= not done. ~  labs 3/11 showed BS= 93, A1c= 6.1 ~  labs 9/11 showed BS= 95 ~  Labs 9/12 showed BS= 107, A1c= 6.2 ~  Labs 3/13 showed BS= 99, A1c= 6.3 ~  Ophthalmology check by DrHecker was pos for mild background retinopathy, stable... ~  Labs 9/13 on MetformBid showed BS= 101, A1c= 6.3  GERD (ICD-530.81) - Jenny Madden had EGD in 2002 showing 2cm HH, gastritis, duodenitis... controlled on OMEPRAZOLE 20mg /d... ~  EGD 2/10 by DrJacobs showed HH/ Schatzki ring w/ balloon dilatation... ~  EGD 2/12 by DrJacobs w/ Schatzki ring redomonstarted & another dilatation performed... ~  EGD 1/13 by DrJacobs w/ sm HH & Schatzki ring dilated once again...  DIVERTICULOSIS OF COLON (ICD-562.10) & COLONIC POLYPS (ICD-211.3) -  ~  last colonoscopy was 1/08 by DrSam showing divertics only... f/u 45yrs planned due to [facility] w/ colon Ca... ~  Colonoscopy 1/13 by DrJacobs showed mild divertics, otherw neg...  DEGENERATIVE JOINT DISEASE, GENERALIZED (ICD-715.00)  OSTEOPOROSIS >> supposed to be on Calcium, MVI, Vit D 2000u daily... Jenny Madden is INTOL to all bisphoshonates by mouth... ~  BMD 10/10 by DrMezer  at [facility] showed TScore -3.3 in L2 but we don't have full report... ~  BMD 3/13 showed TScores -1.9 (-2.7 at L2) in Spine and -2.0 in left [facility]; Jenny Madden considered Reclast but decided against it....  VIT D DEFICIENCY - Vit D level 9/11= 23... rec to start 2000 u OTC Vit D supplement daily...  HEADACHES, HX OF (ICD-V13.8) - CTBrain in 2007 w/ some atrophy.  Hx of TINNITUS (ICD-388.30)  ANXIETY (ICD-300.00) - on ALPRAZOLAM 0.5mg  Prn...  Health Maintenance: ~  GI= DrJacobs as above... ~  GYN= sees DrMezer/ Lily Peer since DrFore passed away- PAP, Mammogram July 29, 2023 by DrMezer- neg) & BMD thru his office. ~  Immunizations:  yearly flu shots... PNEUMOVAX 9/10 at age 32... TDAP given 3/11...   Past Surgical History  Procedure Date  . Cesarean section   . Foot surgery     bilateral for hammer toes    Outpatient Encounter Prescriptions as of 07/31/2012  Medication Sig Dispense Refill  . ALPRAZolam (XANAX) 0.5 MG tablet Take 1/2 to 1 whole tablet by mouth three times daily as needed for nerves       . amLODipine (NORVASC) 10 MG tablet Take 1 tablet (10 mg total) by mouth daily.  90 tablet  3  . aspirin 81 MG tablet Take by mouth daily.        . Cholecalciferol (VITAMIN D) 2000 UNITS CAPS Take 1 capsule by mouth daily.        . fexofenadine (ALLEGRA) 180 MG tablet Take 1 tablet (180 mg total) by mouth daily. Take 1 tablet by mouth once daily as needed for allergies  90 tablet  3  . glucose blood (ACCU-CHEK AVIVA) test strip Use as directed to check blood glucose  100 each  3  . Lancet Devices (ACCU-CHEK SOFTCLIX) lancets Use as instructed  3 each  3  . Lancet Devices (LANCING DEVICE) MISC Use as directed to check blood glucose  1 each  3  . metFORMIN (GLUCOPHAGE) 500 MG tablet Take 1 tablet (500 mg total) by mouth 2 (two) times daily with a meal.  180 tablet  3  . omeprazole (PRILOSEC) 20 MG capsule Take 20 mg by mouth daily as needed.      . simvastatin (ZOCOR) 40 MG tablet Take 1  tablet (40 mg total) by mouth every evening.  90 tablet  3  . DISCONTD: Blood Glucose Calibration (ACCU-CHEK AVIVA) SOLN Use as directed to monitor blood glucose       . DISCONTD: Blood Glucose Monitoring Suppl (ACCU-CHEK ACTIVE CARE KIT) KIT Use as directed to check blood glucose       . ACCU-CHEK SOFTCLIX LANCETS lancets Use as directed to check blood glucose      . DISCONTD: Lancets Misc. (ACCU-CHEK SOFTCLIX LANCET DEV) KIT Use as directed to check blood glucose        No Known Allergies   Current Medications, Allergies, Past Medical History, Past Surgical History, Family History, and Social History were reviewed in [employer] record.    Review of Systems   Constitutional:  Denies F/C/S, anorexia, unexpected weight change. HEENT:  No HA, visual changes, earache, sore throat, hoarseness; notes allergy symptoms. Resp:  No cough, sputum, hemoptysis; no SOB, tightness, wheezing. Cardio:  No CP, ch in DOE, orthopnea, edema; notes occas palpit- "it's gas" Jenny Madden says. GI:  Denies N/V/D/C or blood in stool; no reflux, abd pain, distention. GU:  No dysuria, freq, urgency, hematuria, or flank pain. MS:  Notes pain left post hip/ thigh related to exerc; good ROM, no rash, no swelling, etc. Neuro:  No tremors, seizures, dizziness, syncope, weakness, numbness, gait abn. Skin:  No suspicious lesions or skin rash. Heme:  No adenopathy, bruising, bleeding. Psyche: Denies confusion, sleep disturbance, hallucinations, anxiety, depression.    Objective:   Physical Exam   WD, WN, 70 y/o BF in NAD...   General:  Alert & oriented  HEENT:  [place]/AT,  TMs-wnl, NOSE-clear, THROAT-clear & wnl. Neck:  Supple w/ fair ROM; no JVD; normal carotid impulses w/o bruits; no thyromegaly or nodules palpated; no lymphadenopathy. Chest:  Clear to P & A; without wheezes/ rales/ or rhonchi heard... Heart:  Regular Rhythm; norm S1 & S2 without murmurs/ rubs/ or gallops detected... Abdomen:  Soft ;  normal bowel sounds; no organomegaly or masses palpated, generalized abd tenderness .., no guarding , neg CVA tenderness  Ext:  without deformities, mild arthritic changes; no varicose veins, +venous insuffic, no edema;  Neg SLR , nontender back.  Neuro:    gait normal & balance OK... Derm:  No lesions noted; no rash etc... Lymph:  No cervical, supraclavicular, axillary, or inguinal adenopathy palpated...     Assessment & Plan:

## 2012-07-31 NOTE — Assessment & Plan Note (Signed)
?  flare -no sign of diverticulitis but suspect symptoms are related to constipation  ?IBS  Check LFT, UA   Plan Add Citrucel or Metamucil daily  Stool softner -colace 100mg  daily  Gas x w/ meals .  I will call with labs  Please contact office for sooner follow up if symptoms do not improve or worsen or seek emergency care

## 2012-07-31 NOTE — Patient Instructions (Addendum)
Add Citrucel or Metamucil daily  Stool softner -colace 100mg  daily  Gas x w/ meals .  I will call with labs  Please contact office for sooner follow up if symptoms do not improve or worsen or seek emergency care

## 2012-08-03 LAB — URINE CULTURE: Colony Count: 35000

## 2012-08-05 NOTE — Progress Notes (Signed)
Quick Note:  Called spoke with patient, advised of lab results / recs as stated by TP. Pt verbalized her understanding and denied any questions. ______ 

## 2012-10-10 ENCOUNTER — Other Ambulatory Visit: Payer: Self-pay | Admitting: *Deleted

## 2012-10-10 MED ORDER — AMLODIPINE BESYLATE 10 MG PO TABS: 10.0000 mg | ORAL_TABLET | Freq: Every day | ORAL | Status: DC

## 2012-10-15 ENCOUNTER — Other Ambulatory Visit: Payer: Self-pay | Admitting: Pulmonary Disease

## 2012-10-15 MED ORDER — OMEPRAZOLE 20 MG PO CPDR: 20.0000 mg | DELAYED_RELEASE_CAPSULE | Freq: Every day | ORAL | Status: DC

## 2012-10-16 ENCOUNTER — Other Ambulatory Visit: Payer: Self-pay | Admitting: Pulmonary Disease

## 2012-10-16 MED ORDER — OMEPRAZOLE 20 MG PO CPDR: 20.0000 mg | DELAYED_RELEASE_CAPSULE | Freq: Every day | ORAL | Status: DC

## 2012-12-16 ENCOUNTER — Ambulatory Visit: Payer: Medicare Other | Admitting: Gastroenterology

## 2012-12-31 ENCOUNTER — Encounter: Payer: Self-pay | Admitting: Pulmonary Disease

## 2012-12-31 ENCOUNTER — Telehealth: Payer: Self-pay | Admitting: Pulmonary Disease

## 2012-12-31 ENCOUNTER — Ambulatory Visit (INDEPENDENT_AMBULATORY_CARE_PROVIDER_SITE_OTHER): Payer: Medicare Other | Admitting: Pulmonary Disease

## 2012-12-31 ENCOUNTER — Other Ambulatory Visit (INDEPENDENT_AMBULATORY_CARE_PROVIDER_SITE_OTHER): Payer: Medicare Other

## 2012-12-31 VITALS — BP 120/64 | HR 88 | Temp 97.7°F | Ht 64.0 in | Wt 170.2 lb

## 2012-12-31 DIAGNOSIS — E119 Type 2 diabetes mellitus without complications: Secondary | ICD-10-CM

## 2012-12-31 DIAGNOSIS — K219 Gastro-esophageal reflux disease without esophagitis: Secondary | ICD-10-CM

## 2012-12-31 DIAGNOSIS — D126 Benign neoplasm of colon, unspecified: Secondary | ICD-10-CM

## 2012-12-31 DIAGNOSIS — E7800 Pure hypercholesterolemia, unspecified: Secondary | ICD-10-CM

## 2012-12-31 DIAGNOSIS — I10 Essential (primary) hypertension: Secondary | ICD-10-CM

## 2012-12-31 DIAGNOSIS — M8580 Other specified disorders of bone density and structure, unspecified site: Secondary | ICD-10-CM

## 2012-12-31 DIAGNOSIS — K5730 Diverticulosis of large intestine without perforation or abscess without bleeding: Secondary | ICD-10-CM

## 2012-12-31 DIAGNOSIS — M949 Disorder of cartilage, unspecified: Secondary | ICD-10-CM

## 2012-12-31 DIAGNOSIS — E559 Vitamin D deficiency, unspecified: Secondary | ICD-10-CM

## 2012-12-31 DIAGNOSIS — R9389 Abnormal findings on diagnostic imaging of other specified body structures: Secondary | ICD-10-CM

## 2012-12-31 DIAGNOSIS — M159 Polyosteoarthritis, unspecified: Secondary | ICD-10-CM

## 2012-12-31 DIAGNOSIS — M899 Disorder of bone, unspecified: Secondary | ICD-10-CM

## 2012-12-31 DIAGNOSIS — F411 Generalized anxiety disorder: Secondary | ICD-10-CM

## 2012-12-31 LAB — HEMOGLOBIN A1C: Hgb A1c MFr Bld: 6.2 % (ref 4.6–6.5)

## 2012-12-31 LAB — CBC WITH DIFFERENTIAL/PLATELET
Basophils Relative: 0.2 % (ref 0.0–3.0)
Eosinophils Relative: 0.9 % (ref 0.0–5.0)
Lymphocytes Relative: 29.2 % (ref 12.0–46.0)
MCV: 81.4 fl (ref 78.0–100.0)
Monocytes Relative: 7.8 % (ref 3.0–12.0)
Neutrophils Relative %: 61.9 % (ref 43.0–77.0)
RBC: 5.13 Mil/uL — ABNORMAL HIGH (ref 3.87–5.11)
WBC: 6.9 10*3/uL (ref 4.5–10.5)

## 2012-12-31 LAB — HEPATIC FUNCTION PANEL
ALT: 20 U/L (ref 0–35)
Albumin: 4.3 g/dL (ref 3.5–5.2)
Total Bilirubin: 0.4 mg/dL (ref 0.3–1.2)
Total Protein: 8 g/dL (ref 6.0–8.3)

## 2012-12-31 LAB — BASIC METABOLIC PANEL
BUN: 14 mg/dL (ref 6–23)
Chloride: 103 mEq/L (ref 96–112)
Creatinine, Ser: 1 mg/dL (ref 0.4–1.2)

## 2012-12-31 LAB — LIPID PANEL
Cholesterol: 157 mg/dL (ref 0–200)
LDL Cholesterol: 99 mg/dL (ref 0–99)
Triglycerides: 63 mg/dL (ref 0.0–149.0)

## 2012-12-31 MED ORDER — FLUTICASONE PROPIONATE 50 MCG/ACT NA SUSP: 2.0000 | Freq: Every day | NASAL | Status: DC

## 2012-12-31 MED ORDER — FEXOFENADINE HCL 180 MG PO TABS: 180.0000 mg | ORAL_TABLET | Freq: Every day | ORAL | Status: DC

## 2012-12-31 NOTE — Patient Instructions (Addendum)
Today we updated your med list in our EPIC system...    Continue your current medications the same...    We refilled the meds you requested...  For your sinuses>>    Use the Allegra each AM...    Spray a SALINE mist every 1-2H as needed to keep the nasal passages open 7 draining...    Spray the new FLONASE 2sp in each nostril at bedtime...  Today we did your follow up FASTING blood work...    We will contact you w/ the results when available...   Keep up the good work w/ diet AND exercise...  Call for any questions...  Let's plan a follow up visit in 51mo, sooner if needed for problems.Marland KitchenMarland Kitchen

## 2012-12-31 NOTE — Telephone Encounter (Signed)
Returned patients call. 2 D Echo has been scheduled at [facility] for Tues 01/06/13 at 10:30. Patient is aware of this appointment date, time and location. 2D Echo has been authorized with pt's insurance. Rhonda J Cobb

## 2012-12-31 NOTE — Progress Notes (Signed)
Subjective:    Patient ID: Jenny Madden, female    DOB: 1941-04-07, 72 y.o.   MRN: [id number]  HPI 72 y/o BF here for a follow up visit and on-going management of mult med problems including HBP;  Abn 2DEcho;  Hyperchol;  DM w/ good control;  GERD/ Divertics/ Colon Polyps;  DJD/ Vit D defic;  Hx HAs &  Tinnitus;  Anxiety...  ~  June 28, 2011:  103mo ROV & she reports that he never switched to Atorvastatin due to it's cost (still on Simva40; weight is unchanged at 176# not really on a diet "I try to eat right"    HBP>  Controlled on Amlodipine w/ BP= 126/68 today & denies CP, palpit, cerebral ischemic symptoms, SOB, edema, etc...    Chol>  Stable on Simva40 w/ TChol 160, TG 59, HDL 52, LDL 96;  Continue same + low chol/ low fat diet...    DM>  Stable on Metform500Bid w/ FBS=107 & A1c=6.2;  She saw DrHecker 4/12 w/ mild DM retinopathy noted; BS at home are all good 90-110 range.    GI>  She has GERD, sm HH, schatzki ring (balloon dil 2/12 by DrJacobs), +Divertics, colon polyps;  Stable on Omep?20mg  PRN, denies pain or dysphagia,contin same.    Ortho>  C/o some left shoulder pain for which she saw DrWeingold- given shot (helped some) & PT (helped but too $$); rec to take Caltrate, MVI, Vit D 2000u daily...  ~  December 27, 2011:  103mo ROV & Jenny Madden states she has been doing well, no new complaints or concerns, "I have lots of energy"...    HBP>  Controlled on Amlodip10 w/ BP= 112/60 today & denies CP, palpit, cerebral ischemic symptoms, SOB, edema, etc...    Chol>  Stable on Simva40 w/ TChol 167, TG 68, HDL 55, LDL 98;  Continue same + low chol/ low fat diet...    DM>  Stable on Metform500Bid w/ FBS=99 & A1c=6.3;  She saw DrHecker 4/12 w/ mild DM retinopathy noted; BS at home are all good 90-110 range.    GI>  She has GERD, sm HH, schatzki ring (balloon dil 2/10 by DrJacobs), +Divertics, colon polyps; she saw DrJacobs 1/13 w/ recurrent dysphagia complaints 7 he did a third EGD w/ Schatzki ring  redemonstarted & balloon dilatation performed; she also had colonoscopy mild divertics, otherw neg...    Ortho>  C/o some left shoulder pain for which she saw DrWeingold- given shot (helped some) & PT (helped but too $$); rec to take Caltrate, MVI, Vit D 2000u daily; she is overdue to BMD & we discussed poss Reclast during the OV today, plan is to check the BMD first... LABS 3/13:  FLP- at goals on simva40;  Chems- ok w/ BS=99 A1c=6.3 on MetformBid;  CBC- wnl;  TSH=1.36;  Umicroalb= neg...  ~  July 01, 2012:  45mo ROV & Jenny Madden is doing well overall & her only complaint is gas after milk products suggesting lactose intol & we discussed avoid & Lactaid enzyme as needed...  BP controlled on Norvasc;  FLP is ok on Simva40;  DM control is good on Metformin...     She had EGD & Colon 1/13>  EGD showed Schatzki's ring (dilated), and smHH measuring 1-2cm;  Colonoscopy showed mild diverticulosis, otherw neg...    We reviewed prob list, meds, xrays and labs> see below for updates >> OK Flu shot today... LABS 9/13>  FLP- wnl x LDL=109;  Chems- wnl x BS=101 A1c=6.3   ~  December 31, 2012:  70mo ROV & Jenny Madden is c/o several day hx sneezing, runny nose, facial pressure, etc; she does NOT think this is allergy but rather from "hugging a baby at church"; we discussed Rx w/ Allegra180Qam, Nasal saline Q1-2H during the day, & Flonase Qhs... We reviewed the following medical problems during today's office visit >>     HBP>  Controlled on Amlodip10 w/ BP= 120/64 & denies CP, palpit, cerebral ischemic symptoms, SOB, edema, etc...    AbnEcho> last 2DEcho 2/09 showed mod thickening of MV leaflets, mild calcifAV, mild incr LVwall thickness & mild focal basal septal hypertrophy w/ mid cavity oblit during valsalva;  We will order a f/u 2DEcho => pending    Chol>  Stable on Simva40 w/ TChol 157, TG 63, HDL 46, LDL 99;  Continue same + low chol/ low fat diet...    DM>  Stable on Metform500Bid w/ FBS=103 & A1c=6.2;  She saw  DrHecker 4/13 w/ mild stable DM retinopathy noted; BS at home are all good 90-110 range.    GI>  She has GERD, sm HH, schatzki ring (balloon dil 2/10 by DrJacobs), +Divertics, colon polyps; she saw DrJacobs 1/13 w/ recurrent dysphagia complaints & he did a third EGD w/ Schatzki ring redemonstarted & balloon dilatation performed; she also had colonoscopy mild divertics, otherw neg;  Her CC= gas, some constip & she gets relief from both w/ MOM that she takes once per month...    Ortho>  C/o some left shoulder pain for which she saw DrWeingold- given shot (helped some) & PT (helped but too $$)...    Osteoporosis> on Caltrate, MVI, Vit D 2000u daily; Hx TScore -3.3 per Gyn in L2 in 2010; she is INTOL to all oral Bisphos Rx & refused Reclast; f/u BMD here 3/13 showed -2.7 in L2 but Lspine was -1.9;  Offered Miacalcin vs Endocrine consult but she declines and prefers to wait til 3/15 for f/u BMD... We reviewed prob list, meds, xrays and labs> see below for updates >>  LABS 3/14:  FLP- at goals on Simva40;  Chems- wnl w/ BS=103 A1c=6.2;  CBC- wnl;  TSH=1.68;  VitD=48; Umicroalb- neg... 2DEcho 4/14 showed mildLVH, good LVF w/ EF=70% & norm wall motion, Gr1DD, calcif mitral annulus, ?flow murmur...           Problem List:  ALLERGIC RHINITIS (ICD-477.9) - she uses Allegra Prn...  HYPERTENSION (ICD-401.9) - controlled on NORVASC 10mg /d and takes ASA 81mg /d as well...   ~  CXR 9/11 showed normal heart size, clear lungs, scoliosis, NAD... ~  9/12:  BP= 134/82 & similar at home; denies HA, fatigue, visual changes, CP, palipit, dizziness, syncope, dyspnea, edema, etc... ~  3/13:  BP= 112/60 & she remains asymptomatic... ~  9/13:  BP= 120/62 & she denies CP, palpit, SOB, edema, etc... ~  3/14: Controlled on Amlodip10 w/ BP= 120/64 & denies CP, palpit, cerebral ischemic symptoms, SOB, edema, etc...  ABNORMAL ELECTROCARDIOGRAM (ICD-794.31) & ECHOCARDIOGRAM, ABNORMAL (ICD-793.2) - she is asymtomatic without CP,  palpit, SOB, dizziness or syncope... she is active w/ walking, treadmill, etc... ~  baseline EKG showed NSR, poor R progression V1-3 w/ NSSTTWA... no change in f/u tracings. ~  2DEcho in 2006 showed only some mild SAM, tr MR, tr AI, vigorous LVF w/ sm cavity & EF=75% (no change from 2002)... ~  2DEcho 2/09 showed:  Mod thickening of the MV leaflets, & mild calcif of the AoV... The left ventricle was mildly dilated. Overall left ventricular  systolic function was normal. Left ventricular ejection fraction was estimated to be 60 %. There were no left ventricular regional wall motion abnormalities. Left ventricular wall thickness was mildly increased. There was mild focal basal septal hypertrophy. There was no systolic anterior motion of the mitral valve. There was Doppler evidence for dynamic left ventricular mid-cavity obstruction during Valsalva, with a peak velocity of 2.1 m/sec , and with a peak gradient of 18 mmHg. Doppler parameters were consistent with abnormal left ventricular relaxation. ~  2DEcho 4/14 showed mildLVH, good LVF w/ EF=70% & norm wall motion, Gr1DD, calcif mitral annulus, ?flow murmur.  HYPERCHOLESTEROLEMIA (ICD-272.0) - on SIMVASTATIN 40mg /d + diet efforts. ~  FLP 7/08 showed TChol 165, Tg 65, HDL 45, LDL 107. ~  FLP 8/09 showed TChol 144, TG 63, HDL 52, LDL 80 ~  FLP 9/10 showed TChol 158, TG 60, HDL 47, LDL 99 ~  FLP 3/11 showed TChol 162, TG 63, HDL 53, LDL 97 ~  FLP 9/11 showed TChol 169, TG 58, HDL 45, LDL 113 ~  FLP 3/12 on Simva40 showed TChol 167, TG 51, HDL 52, LDL 105 ~  FLP 9/12 on Simva40 showed TChol 160, TG 59, HDL 52, LDL 96 ~  FLP 3/13 on Simva40 showed TChol 167, TG 68, HDL 55, LDL 98 ~  FLP 9/13 on Simva40 showed TChol 177, TG 75, HDL 53, LDL 109 ~  FLP 3/14 on Simva40 showed TChol 157, TG 63, HDL 46, LDL 99  DM (ICD-250.00) - doing well on METFORMIN 500mg Bid + diet... BS at home all ~100-115 per pt. ~  labs 7/08 showed BS=111, HgA1c=6.1 ~  labs 2.09 showed  BS= 112, HgA1c= 6.3.Marland KitchenMarland Kitchen continue same. ~  labs 8/09 showed BS= 111, HgA1c= 6.4 ~  labs 9/10 showed BS= 106, A1c= not done. ~  labs 3/11 showed BS= 93, A1c= 6.1 ~  labs 9/11 showed BS= 95 ~  Labs 9/12 showed BS= 107, A1c= 6.2 ~  Labs 3/13 showed BS= 99, A1c= 6.3 ~  Ophthalmology check by DrHecker was pos for mild background retinopathy, stable... ~  Labs 9/13 on MetformBid showed BS= 101, A1c= 6.3 ~  Labs 3/14 on MetformBid showed BS= 103, A1c= 6.2  GERD (ICD-530.81) - she had EGD in 2002 showing 2cm HH, gastritis, duodenitis... controlled on OMEPRAZOLE 20mg /d... ~  EGD 2/10 by DrJacobs showed HH/ Schatzki ring w/ balloon dilatation... ~  EGD 2/12 by DrJacobs w/ Schatzki ring redomonstarted & another dilatation performed... ~  EGD 1/13 by DrJacobs w/ sm HH & Schatzki ring dilated once again...  DIVERTICULOSIS OF COLON (ICD-562.10) & COLONIC POLYPS (ICD-211.3) -  ~  last colonoscopy was 1/08 by DrSam showing divertics only... f/u 68yrs planned due to [facility] w/ colon Ca... ~  Colonoscopy 1/13 by DrJacobs showed mild divertics, otherw neg... ~  3/14:  Her CC= gas, some constip & she gets relief from both w/ MOM that she takes once per month ("when the gas builds up"); "I drink coconut milk" ...  DEGENERATIVE JOINT DISEASE, GENERALIZED (ICD-715.00)  OSTEOPOROSIS >> supposed to be on Calcium, MVI, Vit D 2000u daily... She is INTOL to all bisphoshonates by mouth... ~  BMD 10/10 by DrMezer at [facility] showed TScore -3.3 in L2 but we don't have full report... ~  BMD 3/13 showed TScores -1.9 (-2.7 at L2) in Spine and -2.0 in left [facility]; she considered Reclast but decided against it.... ~  3/14:  on Caltrate, MVI, Vit D 2000u daily; she is INTOL to  all oral Bisphos Rx & refused Reclast; Offered Miacalcin vs Endocrine consult but she declines and prefers to wait til 3/15 for f/u BMD...  VIT D DEFICIENCY - Vit D level 9/11= 23... rec to start 2000 u OTC Vit D supplement daily... ~  Labs 3/14 showed  Vit D level = 48  HEADACHES, HX OF (ICD-V13.8) - CTBrain in 2007 w/ some atrophy.  Hx of TINNITUS (ICD-388.30)  ANXIETY (ICD-300.00) - on ALPRAZOLAM 0.5mg  Prn...  Health Maintenance: ~  GI= DrJacobs as above... ~  GYN= sees DrMezer/ Lily Peer since DrFore passed away- PAP, Mammogram 07-31-23 by DrMezer- neg) & BMD thru his office. ~  Immunizations:  yearly flu shots... PNEUMOVAX 9/10 at age 55... TDAP given 3/11...   Past Surgical History  Procedure Laterality Date  . Cesarean section    . Foot surgery      bilateral for hammer toes    Outpatient Encounter Prescriptions as of 12/31/2012  Medication Sig Dispense Refill  . ACCU-CHEK SOFTCLIX LANCETS lancets Use as directed to check blood glucose      . ALPRAZolam (XANAX) 0.5 MG tablet Take 1/2 to 1 whole tablet by mouth three times daily as needed for nerves       . amLODipine (NORVASC) 10 MG tablet Take 1 tablet (10 mg total) by mouth daily.  90 tablet  1  . aspirin 81 MG tablet Take by mouth daily.        . Cholecalciferol (VITAMIN D) 2000 UNITS CAPS Take 1 capsule by mouth daily.        Marland Kitchen docusate sodium (COLACE) 100 MG capsule Take 100 mg by mouth daily.      . fexofenadine (ALLEGRA) 180 MG tablet Take 1 tablet (180 mg total) by mouth daily. Take 1 tablet by mouth once daily as needed for allergies  90 tablet  3  . glucose blood (ACCU-CHEK AVIVA) test strip Use as directed to check blood glucose  100 each  3  . Lancet Devices (ACCU-CHEK SOFTCLIX) lancets Use as instructed  3 each  3  . Lancet Devices (LANCING DEVICE) MISC Use as directed to check blood glucose  1 each  3  . metFORMIN (GLUCOPHAGE) 500 MG tablet Take 1 tablet (500 mg total) by mouth 2 (two) times daily with a meal.  180 tablet  3  . omeprazole (PRILOSEC) 20 MG capsule Take 1 capsule (20 mg total) by mouth daily.  90 capsule  1  . Simethicone (GAS-X CHILDRENS PO) Take 1 tablet by mouth as needed.      . simvastatin (ZOCOR) 40 MG tablet Take 1 tablet (40 mg total) by  mouth every evening.  90 tablet  3   No facility-administered encounter medications on file as of 12/31/2012.    No Known Allergies   Current Medications, Allergies, Past Medical History, Past Surgical History, Family History, and Social History were reviewed in [employer] record.    Review of Systems   Constitutional:  Denies F/C/S, anorexia, unexpected weight change. HEENT:  No HA, visual changes, earache, sore throat, hoarseness; notes allergy symptoms. Resp:  No cough, sputum, hemoptysis; no SOB, tightness, wheezing. Cardio:  No CP, ch in DOE, orthopnea, edema; notes occas palpit- "it's gas" she says. GI:  Denies N/V/D/C or blood in stool; no reflux, abd pain, distention. GU:  No dysuria, freq, urgency, hematuria, or flank pain. MS:  Notes pain left post hip/ thigh related to exerc; good ROM, no rash, no swelling, etc. Neuro:  No  tremors, seizures, dizziness, syncope, weakness, numbness, gait abn. Skin:  No suspicious lesions or skin rash. Heme:  No adenopathy, bruising, bleeding. Psyche: Denies confusion, sleep disturbance, hallucinations, anxiety, depression.    Objective:   Physical Exam   WD, WN, 71 y/o BF in NAD... Vital Signs:  Reviewed... BP= 138/62 General:  Alert & oriented; pleasant & cooperative... HEENT:  [place]/AT, EOM-wnl, PERRLA, EACs-clear, TMs-wnl, NOSE-clear, THROAT-clear & wnl. Neck:  Supple w/ fair ROM; no JVD; normal carotid impulses w/o bruits; no thyromegaly or nodules palpated; no lymphadenopathy. Chest:  Clear to P & A; without wheezes/ rales/ or rhonchi heard... Heart:  Regular Rhythm; norm S1 & S2 without murmurs/ rubs/ or gallops detected... Abdomen:  Soft & nontender; normal bowel sounds; no organomegaly or masses palpated... Ext:  without deformities, mild arthritic changes; no varicose veins, +venous insuffic, no edema;  Neuro:  CNs intact; motor testing normal; sensory testing normal; gait normal & balance OK... Derm:  No  lesions noted; no rash etc... Lymph:  No cervical, supraclavicular, axillary, or inguinal adenopathy palpated...   RADIOLOGY DATA:  Reviewed in the EPIC EMR & discussed w/ the patient...  LABORATORY DATA:  Reviewed in the EPIC EMR & discussed w/ the patient...    >>LABS 3/13:  FLP- at goals on simva40;  Chems- ok w/ BS=99 A1c=6.3 on MetformBid;  CBC- wnl;  TSH=1.36;  Umicroalb= neg...    Assessment & Plan:    HBP>  Controlled on Amlodipine monotherapy; continue same + diet, exercise, keep wt down...  ABN EKG & ECHO>  SEE ABOVE, on ASA & Amlodipine, continue same, stay active etc...  HYPERCHOL>  Stable on Simva40 w/ FLP looking good, but needs better diet & some wt reduction...  DM>  Stable on the Metformin monotherapy, we reviewed diet as well...  GERD>  She denies reflux or dysphagia; uses the Omep 20mg  as needed...  Divertics, Polyps>  Her brother had colon cancer; due for 35yr f/u colon 1/13...  DJD>  She knows to minimize any NSAIDs & use Tylenol etc; she has f/u DrWeingold for her left shoulder discomfort...  Osteoporosis>  BMD w/ osteopenia but she declined Reclast...  Vit D Defic>  On OTC Vit D supplementation...  Anxiety>  prn Alprazolam as needed...   Patient's Medications  New Prescriptions   FLUTICASONE (FLONASE) 50 MCG/ACT NASAL SPRAY    Place 2 sprays into the nose at bedtime.  Previous Medications   ACCU-CHEK SOFTCLIX LANCETS LANCETS    Use as directed to check blood glucose   ALPRAZOLAM (XANAX) 0.5 MG TABLET    Take 1/2 to 1 whole tablet by mouth three times daily as needed for nerves    AMLODIPINE (NORVASC) 10 MG TABLET    Take 1 tablet (10 mg total) by mouth daily.   ASPIRIN 81 MG TABLET    Take by mouth daily.     CHOLECALCIFEROL (VITAMIN D) 2000 UNITS CAPS    Take 1 capsule by mouth daily.     DOCUSATE SODIUM (COLACE) 100 MG CAPSULE    Take 100 mg by mouth daily.   GLUCOSE BLOOD (ACCU-CHEK AVIVA) TEST STRIP    Use as directed to check blood glucose    LANCET DEVICES (ACCU-CHEK SOFTCLIX) LANCETS    Use as instructed   LANCET DEVICES (LANCING DEVICE) MISC    Use as directed to check blood glucose   METFORMIN (GLUCOPHAGE) 500 MG TABLET    Take 1 tablet (500 mg total) by mouth 2 (two) times daily with a  meal.   OMEPRAZOLE (PRILOSEC) 20 MG CAPSULE    Take 1 capsule (20 mg total) by mouth daily.   SIMETHICONE (GAS-X CHILDRENS PO)    Take 1 tablet by mouth as needed.   SIMVASTATIN (ZOCOR) 40 MG TABLET    Take 1 tablet (40 mg total) by mouth every evening.  Modified Medications   Modified Medication Previous Medication   FEXOFENADINE (ALLEGRA) 180 MG TABLET fexofenadine (ALLEGRA) 180 MG tablet      Take 1 tablet (180 mg total) by mouth daily. Take 1 tablet by mouth once daily as needed for allergies    Take 1 tablet (180 mg total) by mouth daily. Take 1 tablet by mouth once daily as needed for allergies  Discontinued Medications   No medications on file

## 2013-01-01 LAB — VITAMIN D 25 HYDROXY (VIT D DEFICIENCY, FRACTURES): Vit D, 25-Hydroxy: 48 ng/mL (ref 30–89)

## 2013-01-01 LAB — MICROALBUMIN / CREATININE URINE RATIO
Microalb Creat Ratio: 0.4 mg/g (ref 0.0–30.0)
Microalb, Ur: 1.1 mg/dL (ref 0.0–1.9)

## 2013-01-02 ENCOUNTER — Ambulatory Visit: Payer: Self-pay | Admitting: Podiatry

## 2013-01-06 ENCOUNTER — Ambulatory Visit (HOSPITAL_COMMUNITY): Payer: Medicare Other | Attending: Cardiology | Admitting: Radiology

## 2013-01-06 DIAGNOSIS — R9389 Abnormal findings on diagnostic imaging of other specified body structures: Secondary | ICD-10-CM

## 2013-01-06 DIAGNOSIS — R011 Cardiac murmur, unspecified: Secondary | ICD-10-CM | POA: Insufficient documentation

## 2013-01-06 NOTE — Progress Notes (Signed)
Echocardiogram performed.  

## 2013-01-12 ENCOUNTER — Telehealth: Payer: Self-pay | Admitting: Pulmonary Disease

## 2013-01-12 NOTE — Telephone Encounter (Signed)
Please advise lab results from 12/31/12. thanks

## 2013-01-12 NOTE — Telephone Encounter (Signed)
Called and spoke with pt and she is aware of her lab results per SN.  Pt is not able to get in her mychart account at this time.  She is wanting the results of her echo that was done last Tuesday.  SN please advise. Thanks

## 2013-01-13 NOTE — Telephone Encounter (Signed)
[email or web address] to discuss her echo results per SN.

## 2013-01-13 NOTE — Telephone Encounter (Signed)
Pt returned Leigh's call & can be reached at [phone].  Jenny Madden

## 2013-01-13 NOTE — Telephone Encounter (Signed)
Called and spoke with pt and and she is aware of echo results per SN.   Pt voiced her understanding of these results and nothing further is needed.

## 2013-01-19 ENCOUNTER — Ambulatory Visit (INDEPENDENT_AMBULATORY_CARE_PROVIDER_SITE_OTHER): Payer: Medicare Other | Admitting: Podiatry

## 2013-01-19 ENCOUNTER — Encounter: Payer: Self-pay | Admitting: Podiatry

## 2013-01-19 VITALS — BP 123/72 | HR 83 | Ht 64.5 in | Wt 173.0 lb

## 2013-01-19 DIAGNOSIS — M25579 Pain in unspecified ankle and joints of unspecified foot: Secondary | ICD-10-CM

## 2013-01-19 DIAGNOSIS — B351 Tinea unguium: Secondary | ICD-10-CM

## 2013-01-19 NOTE — Progress Notes (Signed)
SUBJECTIVE: 72 y.o. year old female presents for diabetic foot care. Patient is ambulatory without assistance. Patient's PCP is Dr. Kriste Basque.  Pain on 3rd and 4th right at times in shoes, which is relieved with Aprazolam.  Patient is type II NIDDM for duration of 10 years. Stated that blood sugar level is under control. HgA1c was 6.2 three weeks ago.   REVIEW OF SYSTEMS: Constitutional: negative for anorexia, chills, fatigue, fevers, malaise, night sweats and weight loss Eyes: negative Ears, nose, mouth, throat, and face: negative Respiratory: negative Cardiovascular: negative Gastrointestinal: Problem with gas sometimes. Genitourinary:negative Integument/breast: negative  OBJECTIVE: DERMATOLOGIC EXAMINATION: Nails: MMN x 10. Skin Integrity: No abnormal skin lesions   VASCULAR EXAMINATION OF LOWER LIMBS: Pedal pulses: All pedal pulses are palpable with normal pulsation.  Capillary Filling times within 3 seconds in all digits.  Edema: negative noted in the lower limbs bilateral.  Ischemic changes negative on bilateral. Temperature gradient from tibial crest to dorsum of foot is within normal bilateral.  NEUROLOGIC EXAMINATION OF THE LOWER LIMBS: Achilles DTR is present and within normal. Monofilament (Semmes-Weinstein 10-gm) sensory testing positive 6 out of 6, bilateral. Vibratory sensations(128Hz  turning fork) intact at medial and lateral forefoot bilateral.  Sharp and Dull discriminatory sensations at the plantar ball of hallux is intact bilateral.   MUSCULOSKELETAL EXAMINATION: S/P hammer toe repair 5 bil.  No other problems.   ASSESSMENT: Mycotic nails x 10. NIDDM under control. Pain in foot.  PLAN: Debrided all nails x 10. Return in 3 months or as needed.

## 2013-01-19 NOTE — Patient Instructions (Addendum)
Seen for Diabetic foot care. Mycotic nails x 10. All nails trimmed.  Return in 3 months.

## 2013-02-02 ENCOUNTER — Telehealth: Payer: Self-pay | Admitting: Pulmonary Disease

## 2013-02-02 MED ORDER — GLUCOSE BLOOD VI STRP: ORAL_STRIP | Status: DC

## 2013-02-02 MED ORDER — FEXOFENADINE HCL 180 MG PO TABS: 180.0000 mg | ORAL_TABLET | Freq: Every day | ORAL | Status: DC

## 2013-02-02 NOTE — Telephone Encounter (Signed)
Spoke with pt to verify the msg Rxs were sent to [facility] \ Nothing further needed per pt

## 2013-04-15 ENCOUNTER — Other Ambulatory Visit: Payer: Self-pay | Admitting: Pulmonary Disease

## 2013-04-21 ENCOUNTER — Ambulatory Visit: Payer: Medicare Other | Admitting: Podiatry

## 2013-05-04 ENCOUNTER — Other Ambulatory Visit: Payer: Self-pay | Admitting: Pulmonary Disease

## 2013-05-08 ENCOUNTER — Telehealth: Payer: Self-pay | Admitting: Pulmonary Disease

## 2013-05-08 MED ORDER — METFORMIN HCL 500 MG PO TABS: ORAL_TABLET | ORAL | Status: DC

## 2013-05-08 NOTE — Telephone Encounter (Signed)
I spoke with pt and she stated [facility] has tried calling us for her refill on metformin. Pt states she takes this twice daily 1 tablet. i advised will get this sent. Nothing further needed

## 2013-07-06 ENCOUNTER — Other Ambulatory Visit: Payer: Self-pay | Admitting: Pulmonary Disease

## 2013-07-06 DIAGNOSIS — Z1231 Encounter for screening mammogram for malignant neoplasm of breast: Secondary | ICD-10-CM

## 2013-07-07 ENCOUNTER — Other Ambulatory Visit (INDEPENDENT_AMBULATORY_CARE_PROVIDER_SITE_OTHER): Payer: Medicare Other

## 2013-07-07 ENCOUNTER — Ambulatory Visit (INDEPENDENT_AMBULATORY_CARE_PROVIDER_SITE_OTHER): Payer: Medicare Other | Admitting: Pulmonary Disease

## 2013-07-07 ENCOUNTER — Encounter: Payer: Self-pay | Admitting: Pulmonary Disease

## 2013-07-07 VITALS — BP 118/66 | HR 80 | Temp 97.5°F | Ht 64.0 in | Wt 174.0 lb

## 2013-07-07 DIAGNOSIS — E119 Type 2 diabetes mellitus without complications: Secondary | ICD-10-CM

## 2013-07-07 DIAGNOSIS — M159 Polyosteoarthritis, unspecified: Secondary | ICD-10-CM

## 2013-07-07 DIAGNOSIS — I10 Essential (primary) hypertension: Secondary | ICD-10-CM

## 2013-07-07 DIAGNOSIS — K5730 Diverticulosis of large intestine without perforation or abscess without bleeding: Secondary | ICD-10-CM

## 2013-07-07 DIAGNOSIS — E7800 Pure hypercholesterolemia, unspecified: Secondary | ICD-10-CM

## 2013-07-07 DIAGNOSIS — M899 Disorder of bone, unspecified: Secondary | ICD-10-CM

## 2013-07-07 DIAGNOSIS — E559 Vitamin D deficiency, unspecified: Secondary | ICD-10-CM

## 2013-07-07 DIAGNOSIS — K219 Gastro-esophageal reflux disease without esophagitis: Secondary | ICD-10-CM

## 2013-07-07 DIAGNOSIS — Z23 Encounter for immunization: Secondary | ICD-10-CM

## 2013-07-07 DIAGNOSIS — M8580 Other specified disorders of bone density and structure, unspecified site: Secondary | ICD-10-CM

## 2013-07-07 DIAGNOSIS — F411 Generalized anxiety disorder: Secondary | ICD-10-CM

## 2013-07-07 LAB — LIPID PANEL
HDL: 53.4 mg/dL (ref >39.00)
Total CHOL/HDL Ratio: 3
VLDL: 12 mg/dL (ref 0.0–40.0)

## 2013-07-07 LAB — BASIC METABOLIC PANEL
CO2: 27 mEq/L (ref 19–32)
Chloride: 108 mEq/L (ref 96–112)
Creatinine, Ser: 0.8 mg/dL (ref 0.4–1.2)
Glucose, Bld: 105 mg/dL — ABNORMAL HIGH (ref 70–99)
Potassium: 4.5 mEq/L (ref 3.5–5.1)
Sodium: 140 mEq/L (ref 135–145)

## 2013-07-07 LAB — HEMOGLOBIN A1C: Hgb A1c MFr Bld: 6.2 % (ref 4.6–6.5)

## 2013-07-07 NOTE — Patient Instructions (Addendum)
Today we updated your med list in our EPIC system...    Continue your current medications the same...  Try [facility] at [phone] to save $$$  Today we did your follow up FASTING blood work...    We will contact you w/ the results when available...   We gave you the 2014 Flu vaccine today...  Call for any questions...  Let's plan a follow up visit in 25mo, sooner if needed for problems.Marland KitchenMarland Kitchen

## 2013-07-07 NOTE — Progress Notes (Signed)
Subjective:    Patient ID: Jenny Madden, female    DOB: [birth year], 72 y.o.   MRN: [id number]  HPI 72 y/o BF here for a follow up visit and on-going management of mult med problems including HBP;  Abn 2DEcho;  Hyperchol;  DM w/ good control;  GERD/ Divertics/ Colon Polyps;  DJD/ Vit D defic;  Hx HAs &  Tinnitus;  Anxiety...  ~  June 28, 2011:  62mo ROV & she reports that he never switched to Atorvastatin due to it's cost (still on Simva40; weight is unchanged at 176# not really on a diet "I try to eat right"    HBP>  Controlled on Amlodipine w/ BP= 126/68 today & denies CP, palpit, cerebral ischemic symptoms, SOB, edema, etc...    Chol>  Stable on Simva40 w/ TChol 160, TG 59, HDL 52, LDL 96;  Continue same + low chol/ low fat diet...    DM>  Stable on Metform500Bid w/ FBS=107 & A1c=6.2;  She saw DrHecker 4/12 w/ mild DM retinopathy noted; BS at home are all good 90-110 range.    GI>  She has GERD, sm HH, schatzki ring (balloon dil 2/12 by DrJacobs), +Divertics, colon polyps;  Stable on Omep?20mg  PRN, denies pain or dysphagia,contin same.    Ortho>  C/o some left shoulder pain for which she saw DrWeingold- given shot (helped some) & PT (helped but too $$); rec to take Caltrate, MVI, Vit D 2000u daily...  ~  December 27, 2011:  62mo ROV & Jenny Madden states she has been doing well, no new complaints or concerns, "I have lots of energy"...    HBP>  Controlled on Amlodip10 w/ BP= 112/60 today & denies CP, palpit, cerebral ischemic symptoms, SOB, edema, etc...    Chol>  Stable on Simva40 w/ TChol 167, TG 68, HDL 55, LDL 98;  Continue same + low chol/ low fat diet...    DM>  Stable on Metform500Bid w/ FBS=99 & A1c=6.3;  She saw DrHecker 4/12 w/ mild DM retinopathy noted; BS at home are all good 90-110 range.    GI>  She has GERD, sm HH, schatzki ring (balloon dil 2/10 by DrJacobs), +Divertics, colon polyps; she saw DrJacobs 1/13 w/ recurrent dysphagia complaints 7 he did a third EGD w/ Schatzki ring  redemonstarted & balloon dilatation performed; she also had colonoscopy mild divertics, otherw neg...    Ortho>  C/o some left shoulder pain for which she saw DrWeingold- given shot (helped some) & PT (helped but too $$); rec to take Caltrate, MVI, Vit D 2000u daily; she is overdue to BMD & we discussed poss Reclast during the OV today, plan is to check the BMD first... LABS 3/13:  FLP- at goals on simva40;  Chems- ok w/ BS=99 A1c=6.3 on MetformBid;  CBC- wnl;  TSH=1.36;  Umicroalb= neg...  ~  July 01, 2012:  55mo ROV & Jenny Madden is doing well overall & her only complaint is gas after milk products suggesting lactose intol & we discussed avoid & Lactaid enzyme as needed...  BP controlled on Norvasc;  FLP is ok on Simva40;  DM control is good on Metformin...     She had EGD & Colon 1/13>  EGD showed Schatzki's ring (dilated), and smHH measuring 1-2cm;  Colonoscopy showed mild diverticulosis, otherw neg...    We reviewed prob list, meds, xrays and labs> see below for updates >> OK Flu shot today... LABS 9/13>  FLP- wnl x LDL=109;  Chems- wnl x BS=101 A1c=6.3   ~  December 31, 2012:  3mo ROV & Jenny Madden is c/o several day hx sneezing, runny nose, facial pressure, etc; she does NOT think this is allergy but rather from "hugging a baby at church"; we discussed Rx w/ [id number], Nasal saline Q1-2H during the day, & Flonase Qhs... We reviewed the following medical problems during today's office visit >>     HBP>  Controlled on Amlodip10 w/ BP= 120/64 & denies CP, palpit, cerebral ischemic symptoms, SOB, edema, etc...    AbnEcho> last 2DEcho 2/09 showed mod thickening of MV leaflets, mild calcifAV, mild incr LVwall thickness & mild focal basal septal hypertrophy w/ mid cavity oblit during valsalva;  We will order a f/u 2DEcho => pending    Chol>  Stable on Simva40 w/ TChol 157, TG 63, HDL 46, LDL 99;  Continue same + low chol/ low fat diet...    DM>  Stable on Metform500Bid w/ FBS=103 & A1c=6.2;  She saw  DrHecker 4/13 w/ mild stable DM retinopathy noted; BS at home are all good 90-110 range.    GI>  She has GERD, sm HH, schatzki ring (balloon dil 2/10 by DrJacobs), +Divertics, colon polyps; she saw DrJacobs 1/13 w/ recurrent dysphagia complaints & he did a third EGD w/ Schatzki ring redemonstarted & balloon dilatation performed; she also had colonoscopy mild divertics, otherw neg;  Her CC= gas, some constip & she gets relief from both w/ MOM that she takes once per month...    Ortho>  C/o some left shoulder pain for which she saw DrWeingold- given shot (helped some) & PT (helped but too $$)...    Osteoporosis> on Caltrate, MVI, Vit D 2000u daily; Hx TScore -3.3 per Gyn in L2 in 2010; she is INTOL to all oral Bisphos Rx & refused Reclast; f/u BMD here 3/13 showed -2.7 in L2 but Lspine was -1.9;  Offered Miacalcin vs Endocrine consult but she declines and prefers to wait til 3/15 for f/u BMD... We reviewed prob list, meds, xrays and labs> see below for updates >>  LABS 3/14:  FLP- at goals on Simva40;  Chems- wnl w/ BS=103 A1c=6.2;  CBC- wnl;  TSH=1.68;  VitD=48; Umicroalb- neg... 2DEcho 4/14 showed mildLVH, good LVF w/ EF=70% & norm wall motion, Gr1DD, calcif mitral annulus, ?flow murmur...   ~  July 07, 2013:  3mo ROV & Jenny Madden reports doing well- no new complaints or concerns... We reviewed the following medical problems during today's office visit >>     HBP>  Controlled on Amlodip10 w/ BP= 118/66 & denies CP, palpit, cerebral ischemic symptoms, SOB, edema, etc...    AbnEcho> prev 2DEcho 2/09 showed mod thickening of MV leaflets, mild calcifAV, mild incr LVwall thickness & mild focal basal septal hypertrophy w/ mid cavity oblit during valsalva; f/u 2DEcho 4/14 showed mildLVH, good LVF w/ EF=70% & norm wall motion, Gr1DD, calcif mitral annulus, ?flow murmur...    Chol> on Simva40; FLP 9/14 showed TChol 150, TG 60, HDL 53, LDL 85;  Continue same + low chol/ low fat diet...    DM>  Stable on  Metform500Bid w/ Labs 9/14 showing FBS=105 & A1c=6.2;  She saw DrHecker 6/14 w/ mild stable DM retinopathy noted; BS at home are all good 90-110 range.    GI>  She has GERD, sm HH, schatzki ring (balloon dil 2/10 by DrJacobs), +Divertics, colon polyps; she saw DrJacobs 1/13 w/ recurrent dysphagia complaints & he did a third EGD w/ Schatzki ring redemonstarted & balloon dilatation performed; she also had colonoscopy  mild divertics, otherw neg;  Her CC= gas, some constip & she gets relief from both w/ MOM that she takes once per month...    Ortho>  C/o some left shoulder pain for which she saw DrWeingold- given shot (helped some) & PT (helped but too $$)...    Osteoporosis> on Caltrate, MVI, Vit D 2000u daily; Hx TScore -3.3 per Gyn in L2 in 2010; she is INTOL to all oral Bisphos Rx & refused Reclast; f/u BMD here 3/13 showed -2.7 in L2 but Lspine was -1.9;  Offered Miacalcin vs Endocrine consult but she declines and prefers to wait til 3/15 for f/u BMD... We reviewed prob list, meds, xrays and labs> see below for updates >> OK FLU shot today; we discussed saving $$ via [facility]... LABS 9/14:  FLP- at goals on Simva40;  Chems- wnl w/ BS=105, A1c=6.2 on Metform500Bid (continue same)...           Problem List:  ALLERGIC RHINITIS (ICD-477.9) - she uses Allegra Prn...  HYPERTENSION (ICD-401.9) - controlled on NORVASC 10mg /d and takes ASA 81mg /d as well...   ~  CXR 9/11 showed normal heart size, clear lungs, scoliosis, NAD... ~  9/12:  BP= 134/82 & similar at home; denies HA, fatigue, visual changes, CP, palipit, dizziness, syncope, dyspnea, edema, etc... ~  3/13:  BP= 112/60 & she remains asymptomatic... ~  9/13:  BP= 120/62 & she denies CP, palpit, SOB, edema, etc... ~  3/14: Controlled on Amlodip10 w/ BP= 120/64 & denies CP, palpit, cerebral ischemic symptoms, SOB, edema, etc. ~  9/14: Controlled on Amlodip10 w/ BP= 118/66 & denies CP, palpit, cerebral ischemic symptoms, SOB, edema,  etc.  ABNORMAL ELECTROCARDIOGRAM (ICD-794.31) & ECHOCARDIOGRAM, ABNORMAL (ICD-793.2) - she is asymtomatic without CP, palpit, SOB, dizziness or syncope... she is active w/ walking, treadmill, etc... ~  baseline EKG showed NSR, poor R progression V1-3 w/ NSSTTWA... no change in f/u tracings. ~  2DEcho in 2006 showed only some mild SAM, tr MR, tr AI, vigorous LVF w/ sm cavity & EF=75% (no change from 2002)... ~  2DEcho 2/09 showed:  Mod thickening of the MV leaflets, & mild calcif of the AoV... The left ventricle was mildly dilated. Overall left ventricular systolic function was normal. Left ventricular ejection fraction was estimated to be 60 %. There were no left ventricular regional wall motion abnormalities. Left ventricular wall thickness was mildly increased. There was mild focal basal septal hypertrophy. There was no systolic anterior motion of the mitral valve. There was Doppler evidence for dynamic left ventricular mid-cavity obstruction during Valsalva, with a peak velocity of 2.1 m/sec , and with a peak gradient of 18 mmHg. Doppler parameters were consistent with abnormal left ventricular relaxation. ~  2DEcho 4/14 showed mildLVH, good LVF w/ EF=70% & norm wall motion, Gr1DD, calcif mitral annulus, ?flow murmur.  HYPERCHOLESTEROLEMIA (ICD-272.0) - on SIMVASTATIN 40mg /d + diet efforts. ~  FLP 7/08 showed TChol 165, Tg 65, HDL 45, LDL 107. ~  FLP 8/09 showed TChol 144, TG 63, HDL 52, LDL 80 ~  FLP 9/10 showed TChol 158, TG 60, HDL 47, LDL 99 ~  FLP 3/11 showed TChol 162, TG 63, HDL 53, LDL 97 ~  FLP 9/11 showed TChol 169, TG 58, HDL 45, LDL 113 ~  FLP 3/12 on Simva40 showed TChol 167, TG 51, HDL 52, LDL 105 ~  FLP 9/12 on Simva40 showed TChol 160, TG 59, HDL 52, LDL 96 ~  FLP 3/13 on Simva40 showed TChol 167, TG  68, HDL 55, LDL 98 ~  FLP 9/13 on Simva40 showed TChol 177, TG 75, HDL 53, LDL 109 ~  FLP 3/14 on Simva40 showed TChol 157, TG 63, HDL 46, LDL 99 ~  FLP 9/14 on Simva40 showed TChol  150, TG 60, HDL 53, LDL 85  DM (ICD-250.00) - doing well on METFORMIN 500mg Bid + diet... BS at home all ~100-115 per pt. ~  labs 7/08 showed BS=111, HgA1c=6.1 ~  labs 2.09 showed BS= 112, HgA1c= 6.3.Marland KitchenMarland Kitchen continue same. ~  labs 8/09 showed BS= 111, HgA1c= 6.4 ~  labs 9/10 showed BS= 106, A1c= not done. ~  labs 3/11 showed BS= 93, A1c= 6.1 ~  labs 9/11 showed BS= 95 ~  Labs 9/12 showed BS= 107, A1c= 6.2 ~  Labs 3/13 showed BS= 99, A1c= 6.3 ~  Labs 9/13 on MetformBid showed BS= 101, A1c= 6.3 ~  Labs 3/14 on MetformBid showed BS= 103, A1c= 6.2 ~  6/14:  Ophthalmology check by DrHecker was pos for mild background retinopathy, stable... ~  Labs 9/14 on Metform500bid showed FBS=105 & A1c=6.2  GERD (ICD-530.81) - she had EGD in 2002 showing 2cm HH, gastritis, duodenitis... controlled on OMEPRAZOLE 20mg /d... ~  EGD 2/10 by DrJacobs showed HH/ Schatzki ring w/ balloon dilatation... ~  EGD 2/12 by DrJacobs w/ Schatzki ring redomonstarted & another dilatation performed... ~  EGD 1/13 by DrJacobs w/ sm HH & Schatzki ring dilated once again...  DIVERTICULOSIS OF COLON (ICD-562.10) & COLONIC POLYPS (ICD-211.3) -  ~  last colonoscopy was 1/08 by DrSam showing divertics only... f/u 26yrs planned due to [facility] w/ colon Ca... ~  Colonoscopy 1/13 by DrJacobs showed mild divertics, otherw neg... ~  3/14:  Her CC= gas, some constip & she gets relief from both w/ MOM that she takes once per month ("when the gas builds up"); "I drink coconut milk" ...  DEGENERATIVE JOINT DISEASE, GENERALIZED (ICD-715.00)  OSTEOPOROSIS >> supposed to be on Calcium, MVI, Vit D 2000u daily... She is INTOL to all bisphoshonates by mouth... ~  BMD 10/10 [facility]Mezer at [facility] showed TScore -3.3 in L2 but we don't have full report... ~  BMD 3/13 showed TScores -1.9 (-2.7 at L2) in Spine and [facility].0 [facility]; she considered Reclast but decided against it.... ~  3/14:  on Caltrate, MVI, Vit D 2000u daily; she is INTOL to all oral  Bisphos Rx & refused Reclast; Offered Miacalcin vs Endocrine consult but she declines and prefers to wait til 3/15 for f/u BMD...  VIT D DEFICIENCY - Vit D level 9/11= 23... rec to start 2000 u OTC Vit D supplement daily... ~  Labs 3/14 showed Vit D level = 48  HEADACHES, HX OF (ICD-V13.8) - CTBrain in 2007 w/ some atrophy.  Hx of TINNITUS (ICD-388.30)  ANXIETY (ICD-300.00) - on ALPRAZOLAM 0.5mg  Prn...  Health Maintenance: ~  GI= DrJacobs as above... ~  GYN= sees DrMezer/ Lily Peer since DrFore passed away- PAP, [facility] ~  Immunizations:  yearly flu shots... PNEUMOVAX 9/10 at age 44... TDAP given 3/11...   Past Surgical History  Procedure Laterality Date  . Cesarean section    . Foot surgery      bilateral for hammer toes    Outpatient Encounter Prescriptions as of 07/07/2013  Medication Sig Dispense Refill  . ACCU-CHEK SOFTCLIX LANCETS lancets Use as directed to check blood glucose      . ALPRAZolam (XANAX) 0.5 MG tablet Take 1/2 to 1 whole tablet  by mouth three times daily as needed for nerves       . amLODipine (NORVASC) 10 MG tablet Take 1 tablet by mouth  daily  90 tablet  1  . aspirin 81 MG tablet Take by mouth daily.        . Cholecalciferol (VITAMIN D) 2000 UNITS CAPS Take 1 capsule by mouth daily.        Marland Kitchen docusate sodium (COLACE) 100 MG capsule Take 100 mg by mouth daily.      . fexofenadine (ALLEGRA) 180 MG tablet Take 1 tablet (180 mg total) by mouth daily. Take 1 tablet by mouth once daily as needed for allergies  90 tablet  3  . fluticasone (FLONASE) 50 MCG/ACT nasal spray Place 2 sprays into the nose at bedtime.  16 g  11  . glucose blood (ACCU-CHEK AVIVA) test strip Use as directed to check blood glucose  100 each  3  . Lancet Devices (LANCING DEVICE) MISC Use as directed to check blood glucose  1 each  3  . metFORMIN (GLUCOPHAGE) 500 MG tablet Take 1 tablet by mouth  twice a day with a meal  180 tablet  1  . omeprazole (PRILOSEC) 20 MG  capsule Take 1 capsule (20 mg total) by mouth daily.  90 capsule  1  . Simethicone (GAS-X CHILDRENS PO) Take 1 tablet by mouth as needed.      . simvastatin (ZOCOR) 40 MG tablet Take 1 tablet by mouth  every evening  90 tablet  1   No facility-administered encounter medications on file as of 07/07/2013.    No Known Allergies   Current Medications, Allergies, Past Medical History, Past Surgical History, Family History, and Social History were reviewed in [employer] record.    Review of Systems   Constitutional:  Denies F/C/S, anorexia, unexpected weight change. HEENT:  No HA, visual changes, earache, sore throat, hoarseness; notes allergy symptoms. Resp:  No cough, sputum, hemoptysis; no SOB, tightness, wheezing. Cardio:  No CP, ch in DOE, orthopnea, edema; notes occas palpit- "it's gas" she says. GI:  Denies N/V/D/C or blood in stool; no reflux, abd pain, distention. GU:  No dysuria, freq, urgency, hematuria, or flank pain. MS:  Notes pain left post hip/ thigh related to exerc; good ROM, no rash, no swelling, etc. Neuro:  No tremors, seizures, dizziness, syncope, weakness, numbness, gait abn. Skin:  No suspicious lesions or skin rash. Heme:  No adenopathy, bruising, bleeding. Psyche: Denies confusion, sleep disturbance, hallucinations, anxiety, depression.    Objective:   Physical Exam   WD, WN, 71 y/o BF in NAD... Vital Signs:  Reviewed... BP= 138/62 General:  Alert & oriented; pleasant & cooperative... HEENT:  [place]/AT, EOM-wnl, PERRLA, EACs-clear, TMs-wnl, NOSE-clear, THROAT-clear & wnl. Neck:  Supple w/ fair ROM; no JVD; normal carotid impulses w/o bruits; no thyromegaly or nodules palpated; no lymphadenopathy. Chest:  Clear to P & A; without wheezes/ rales/ or rhonchi heard... Heart:  Regular Rhythm; norm S1 & S2 without murmurs/ rubs/ or gallops detected... Abdomen:  Soft & nontender; normal bowel sounds; no organomegaly or masses palpated... Ext:   without deformities, mild arthritic changes; no varicose veins, +venous insuffic, no edema;  Neuro:  CNs intact; motor testing normal; sensory testing normal; gait normal & balance OK... Derm:  No lesions noted; no rash etc... Lymph:  No cervical, supraclavicular, axillary, or inguinal adenopathy palpated...   RADIOLOGY DATA:  Reviewed in the EPIC EMR & discussed w/ the patient...  LABORATORY  DATA:  Reviewed in the EPIC EMR & discussed w/ the patient...    >>LABS 3/13:  FLP- at goals on simva40;  Chems- ok w/ BS=99 A1c=6.3 on MetformBid;  CBC- wnl;  TSH=1.36;  Umicroalb= neg...    Assessment & Plan:    HBP>  Controlled on Amlodipine monotherapy; continue same + diet, exercise, keep wt down...  ABN EKG & ECHO>  SEE ABOVE, on ASA & Amlodipine, continue same, stay active etc...  HYPERCHOL>  Stable on Simva40 w/ FLP looking good, but needs better diet & some wt reduction...  DM>  Stable on the Metformin monotherapy, we reviewed diet as well...  GERD>  She denies reflux or dysphagia; uses the Omep 20mg  as needed...   Divertics, Polyps>  Her brother had colon cancer; due for 37yr f/u colon 1/13...  DJD>  She knows to minimize any NSAIDs & use Tylenol etc; she has f/u DrWeingold for her left shoulder discomfort...  Osteoporosis>  BMD w/ osteopenia but she declined Reclast...  Vit D Defic>  On OTC Vit D supplementation...  Anxiety>  prn Alprazolam as needed...   Patient's Medications  New Prescriptions   No medications on file  Previous Medications   ACCU-CHEK SOFTCLIX LANCETS LANCETS    Use as directed to check blood glucose   ALPRAZOLAM (XANAX) 0.5 MG TABLET    Take 1/2 to 1 whole tablet by mouth three times daily as needed for nerves    AMLODIPINE (NORVASC) 10 MG TABLET    Take 1 tablet by mouth  daily   ASPIRIN 81 MG TABLET    Take by mouth daily.     CHOLECALCIFEROL (VITAMIN D) 2000 UNITS CAPS    Take 1 capsule by mouth daily.     DOCUSATE SODIUM (COLACE) 100 MG CAPSULE     Take 100 mg by mouth daily.   FEXOFENADINE (ALLEGRA) 180 MG TABLET    Take 1 tablet (180 mg total) by mouth daily. Take 1 tablet by mouth once daily as needed for allergies   FLUTICASONE (FLONASE) 50 MCG/ACT NASAL SPRAY    Place 2 sprays into the nose at bedtime.   GLUCOSE BLOOD (ACCU-CHEK AVIVA) TEST STRIP    Use as directed to check blood glucose   LANCET DEVICES (LANCING DEVICE) MISC    Use as directed to check blood glucose   METFORMIN (GLUCOPHAGE) 500 MG TABLET    Take 1 tablet by mouth  twice a day with a meal   SIMETHICONE (GAS-X CHILDRENS PO)    Take 1 tablet by mouth as needed.   SIMVASTATIN (ZOCOR) 40 MG TABLET    Take 1 tablet by mouth  every evening  Modified Medications   Modified Medication Previous Medication   OMEPRAZOLE (PRILOSEC) 20 MG CAPSULE omeprazole (PRILOSEC) 20 MG capsule      Take 1 capsule (20 mg total) by mouth daily.    Take 1 capsule (20 mg total) by mouth daily.  Discontinued Medications   No medications on file

## 2013-07-08 NOTE — Progress Notes (Signed)
Quick Note:  Advised pt of lab results per SN. Pt verbalized understanding and has no further concerns or questions ______

## 2013-07-10 ENCOUNTER — Telehealth: Payer: Self-pay | Admitting: Pulmonary Disease

## 2013-07-10 MED ORDER — OMEPRAZOLE 20 MG PO CPDR: 20.0000 mg | DELAYED_RELEASE_CAPSULE | Freq: Every day | ORAL | Status: DC

## 2013-07-10 NOTE — Telephone Encounter (Signed)
Called and spoke with pt and she stated that she will get the prilosec from [facility].  Pt requested that the 6 month supply be sent to them.  Pt is aware that this has been done and pt will call [facility] in about an hour to update her information with them.  Nothing further is needed.

## 2013-07-20 ENCOUNTER — Telehealth: Payer: Self-pay | Admitting: Pulmonary Disease

## 2013-07-20 MED ORDER — AMLODIPINE BESYLATE 10 MG PO TABS: ORAL_TABLET | ORAL | Status: DC

## 2013-07-20 NOTE — Telephone Encounter (Signed)
Pt advised and refill sent. Jennifer Castillo, CMA  

## 2013-07-28 ENCOUNTER — Ambulatory Visit (HOSPITAL_COMMUNITY)
Admission: RE | Admit: 2013-07-28 | Discharge: 2013-07-28 | Disposition: A | Discharge status: Home / Self Care | Payer: Medicare Other | Source: Ambulatory Visit | Attending: Pulmonary Disease | Admitting: Pulmonary Disease

## 2013-07-28 DIAGNOSIS — Z1231 Encounter for screening mammogram for malignant neoplasm of breast: Secondary | ICD-10-CM | POA: Insufficient documentation

## 2013-08-24 ENCOUNTER — Telehealth: Payer: Self-pay | Admitting: Pulmonary Disease

## 2013-08-24 NOTE — Telephone Encounter (Signed)
ATC #. Was advised I needed to enter a pass code. Was not giving any other option. Will sign off message

## 2013-10-09 ENCOUNTER — Telehealth: Payer: Self-pay | Admitting: Pulmonary Disease

## 2013-10-09 NOTE — Telephone Encounter (Signed)
Called and spoke with pt and she wanted [place] to update her mail order pharmacy since she has called all of her meds in today and they will be sending [place] a fax to refill her meds.  Pt is aware that this has been updated.

## 2013-10-14 ENCOUNTER — Telehealth: Payer: Self-pay | Admitting: Pulmonary Disease

## 2013-10-14 MED ORDER — AMLODIPINE BESYLATE 10 MG PO TABS: ORAL_TABLET | ORAL | Status: DC

## 2013-10-14 MED ORDER — SIMVASTATIN 40 MG PO TABS: ORAL_TABLET | ORAL | Status: DC

## 2013-10-14 MED ORDER — ACCU-CHEK AVIVA DEVI: Status: AC

## 2013-10-14 MED ORDER — GLUCOSE BLOOD VI STRP: ORAL_STRIP | Status: DC

## 2013-10-14 MED ORDER — METFORMIN HCL 500 MG PO TABS: ORAL_TABLET | ORAL | Status: DC

## 2013-10-14 NOTE — Telephone Encounter (Signed)
I spoke with the pt and she states she needs to get rx sent for simvastatin, metformin, amlodipine, accu-chek strips, and accu-chek aviva meter. Rx have been sent to [facility]. [place], CMA

## 2013-10-14 NOTE — Telephone Encounter (Signed)
[id number] x1 

## 2014-01-05 ENCOUNTER — Other Ambulatory Visit (INDEPENDENT_AMBULATORY_CARE_PROVIDER_SITE_OTHER): Payer: Medicare HMO

## 2014-01-05 ENCOUNTER — Ambulatory Visit (INDEPENDENT_AMBULATORY_CARE_PROVIDER_SITE_OTHER): Payer: Medicare HMO | Admitting: Pulmonary Disease

## 2014-01-05 ENCOUNTER — Encounter (INDEPENDENT_AMBULATORY_CARE_PROVIDER_SITE_OTHER): Payer: Self-pay

## 2014-01-05 ENCOUNTER — Ambulatory Visit (INDEPENDENT_AMBULATORY_CARE_PROVIDER_SITE_OTHER)
Admission: RE | Admit: 2014-01-05 | Discharge: 2014-01-05 | Disposition: A | Discharge status: Home / Self Care | Payer: Medicare HMO | Source: Ambulatory Visit | Attending: Pulmonary Disease | Admitting: Pulmonary Disease

## 2014-01-05 ENCOUNTER — Encounter: Payer: Self-pay | Admitting: Pulmonary Disease

## 2014-01-05 VITALS — BP 137/80 | HR 85 | Temp 97.4°F | Ht 64.0 in | Wt 174.8 lb

## 2014-01-05 DIAGNOSIS — K219 Gastro-esophageal reflux disease without esophagitis: Secondary | ICD-10-CM

## 2014-01-05 DIAGNOSIS — E119 Type 2 diabetes mellitus without complications: Secondary | ICD-10-CM

## 2014-01-05 DIAGNOSIS — K5730 Diverticulosis of large intestine without perforation or abscess without bleeding: Secondary | ICD-10-CM

## 2014-01-05 DIAGNOSIS — F411 Generalized anxiety disorder: Secondary | ICD-10-CM

## 2014-01-05 DIAGNOSIS — M8580 Other specified disorders of bone density and structure, unspecified site: Secondary | ICD-10-CM

## 2014-01-05 DIAGNOSIS — E559 Vitamin D deficiency, unspecified: Secondary | ICD-10-CM

## 2014-01-05 DIAGNOSIS — I10 Essential (primary) hypertension: Secondary | ICD-10-CM

## 2014-01-05 DIAGNOSIS — M899 Disorder of bone, unspecified: Secondary | ICD-10-CM

## 2014-01-05 DIAGNOSIS — M159 Polyosteoarthritis, unspecified: Secondary | ICD-10-CM

## 2014-01-05 DIAGNOSIS — E7800 Pure hypercholesterolemia, unspecified: Secondary | ICD-10-CM

## 2014-01-05 DIAGNOSIS — D126 Benign neoplasm of colon, unspecified: Secondary | ICD-10-CM

## 2014-01-05 DIAGNOSIS — M949 Disorder of cartilage, unspecified: Secondary | ICD-10-CM

## 2014-01-05 LAB — CBC WITH DIFFERENTIAL/PLATELET
Basophils Absolute: 0 10*3/uL (ref 0.0–0.1)
Basophils Relative: 0.4 % (ref 0.0–3.0)
Eosinophils Absolute: 0 10*3/uL (ref 0.0–0.7)
Eosinophils Relative: 0.7 % (ref 0.0–5.0)
HCT: 40.8 % (ref 36.0–46.0)
Hemoglobin: 13.4 g/dL (ref 12.0–15.0)
Lymphocytes Relative: 38.5 % (ref 12.0–46.0)
Lymphs Abs: 2.5 10*3/uL (ref 0.7–4.0)
MCHC: 32.8 g/dL (ref 30.0–36.0)
MCV: 83 fl (ref 78.0–100.0)
Monocytes Absolute: 0.4 10*3/uL (ref 0.1–1.0)
Monocytes Relative: 6.3 % (ref 3.0–12.0)
Neutro Abs: 3.5 10*3/uL (ref 1.4–7.7)
Neutrophils Relative %: 54.1 % (ref 43.0–77.0)
Platelets: 255 10*3/uL (ref 150.0–400.0)
RBC: 4.92 Mil/uL (ref 3.87–5.11)
RDW: 13.1 % (ref 11.5–14.6)
WBC: 6.4 10*3/uL (ref 4.5–10.5)

## 2014-01-05 LAB — HEPATIC FUNCTION PANEL
ALBUMIN: 4.3 g/dL (ref 3.5–5.2)
ALK PHOS: 55 U/L (ref 39–117)
ALT: 15 U/L (ref 0–35)
AST: 21 U/L (ref 0–37)
Bilirubin, Direct: 0 mg/dL (ref 0.0–0.3)
TOTAL PROTEIN: 7.6 g/dL (ref 6.0–8.3)
Total Bilirubin: 0.5 mg/dL (ref 0.3–1.2)

## 2014-01-05 LAB — BASIC METABOLIC PANEL
BUN: 9 mg/dL (ref 6–23)
CALCIUM: 10.1 mg/dL (ref 8.4–10.5)
CHLORIDE: 104 meq/L (ref 96–112)
CO2: 26 meq/L (ref 19–32)
Creatinine, Ser: 0.9 mg/dL (ref 0.4–1.2)
GFR: 80.92 mL/min (ref >60.00)
GLUCOSE: 106 mg/dL — AB (ref 70–99)
POTASSIUM: 3.9 meq/L (ref 3.5–5.1)
SODIUM: 140 meq/L (ref 135–145)

## 2014-01-05 LAB — TSH: TSH: 1.61 u[IU]/mL (ref 0.35–5.50)

## 2014-01-05 LAB — LIPID PANEL
Cholesterol: 162 mg/dL (ref 0–200)
HDL: 54.2 mg/dL
LDL Cholesterol: 94 mg/dL (ref 0–99)
Total CHOL/HDL Ratio: 3
Triglycerides: 67 mg/dL (ref 0.0–149.0)
VLDL: 13.4 mg/dL (ref 0.0–40.0)

## 2014-01-05 LAB — VITAMIN D 25 HYDROXY (VIT D DEFICIENCY, FRACTURES): Vit D, 25-Hydroxy: 48 ng/mL (ref 30–89)

## 2014-01-05 LAB — HEMOGLOBIN A1C: Hgb A1c MFr Bld: 6.4 % (ref 4.6–6.5)

## 2014-01-05 MED ORDER — GLUCOSE BLOOD VI STRP: ORAL_STRIP | Status: DC

## 2014-01-05 NOTE — Progress Notes (Signed)
Subjective:    Patient ID: Jenny Madden, female    DOB: 02-09-1941, 73 y.o.   MRN: [id number]  HPI 73 y/o BF here for a follow up visit and on-going management of mult med problems including HBP;  Abn 2DEcho;  Hyperchol;  DM w/ good control;  GERD/ Divertics/ Colon Polyps;  DJD/ Vit D defic;  Hx HAs &  Tinnitus;  Anxiety...  ~  July 01, 2012:  40mo ROV & Jenny Madden is doing well overall & her only complaint is gas after milk products suggesting lactose intol & we discussed avoid & Lactaid enzyme as needed...  BP controlled on Norvasc;  FLP is ok on Simva40;  DM control is good on Metformin...     She had EGD & Colon 1/13>  EGD showed Schatzki's ring (dilated), and [facility] measuring 1-2cm;  Colonoscopy showed mild diverticulosis, otherw neg...    We reviewed prob list, meds, xrays and labs> see below for updates >> OK Flu shot today...  LABS 9/13>  FLP- wnl x LDL=109;  Chems- wnl x BS=101 A1c=6.3   ~  December 31, 2012:  65mo ROV & Jenny Madden is c/o several day hx sneezing, runny nose, facial pressure, etc; she does NOT think this is allergy but rather from "hugging a baby at church"; we discussed Rx w/ [id number], Nasal saline Q1-2H during the day, & Flonase Qhs... We reviewed the following medical problems during today's office visit >>     HBP>  Controlled on Amlodip10 w/ BP= 120/64 & denies CP, palpit, cerebral ischemic symptoms, SOB, edema, etc...    AbnEcho> last 2DEcho 2/09 showed mod thickening of MV leaflets, mild calcifAV, mild incr LVwall thickness & mild focal basal septal hypertrophy w/ mid cavity oblit during valsalva;  We will order a f/u 2DEcho => pending    Chol>  Stable on Simva40 w/ TChol 157, TG 63, HDL 46, LDL 99;  Continue same + low chol/ low fat diet...    DM>  Stable on Metform500Bid w/ FBS=103 & A1c=6.2;  She saw DrHecker 4/13 w/ mild stable DM retinopathy noted; BS at home are all good 90-110 range.    GI>  She has GERD, sm HH, schatzki ring (balloon dil 2/10 by DrJacobs),  +Divertics, colon polyps; she saw DrJacobs 1/13 w/ recurrent dysphagia complaints & he did a third EGD w/ Schatzki ring redemonstarted & balloon dilatation performed; she also had colonoscopy mild divertics, otherw neg;  Her CC= gas, some constip & she gets relief from both w/ MOM that she takes once per month...    Ortho>  C/o some left shoulder pain for which she saw DrWeingold- given shot (helped some) & PT (helped but too $$)...    Osteoporosis> on Caltrate, MVI, Vit D 2000u daily; Hx TScore -3.3 per Gyn in L2 in 2010; she is INTOL to all oral Bisphos Rx & refused Reclast; f/u BMD here 3/13 showed -2.7 in L2 but Lspine was -1.9;  Offered Miacalcin vs Endocrine consult but she declines and prefers to wait til 3/15 for f/u BMD... We reviewed prob list, meds, xrays and labs> see below for updates >>   LABS 3/14:  FLP- at goals on Simva40;  Chems- wnl w/ BS=103 A1c=6.2;  CBC- wnl;  TSH=1.68;  VitD=48; Umicroalb- neg...  2DEcho 4/14 showed mildLVH, good LVF w/ EF=70% & norm wall motion, Gr1DD, calcif mitral annulus, ?flow murmur...   ~  July 07, 2013:  54mo ROV & Jenny Madden reports doing well- no new complaints or concerns.Marland KitchenMarland Kitchen  We reviewed the following medical problems during today's office visit >>     HBP>  Controlled on Amlodip10 w/ BP= 118/66 & denies CP, palpit, cerebral ischemic symptoms, SOB, edema, etc...    AbnEcho> prev 2DEcho 2/09 showed mod thickening of MV leaflets, mild calcifAV, mild incr LVwall thickness & mild focal basal septal hypertrophy w/ mid cavity oblit during valsalva; f/u 2DEcho 4/14 showed mildLVH, good LVF w/ EF=70% & norm wall motion, Gr1DD, calcif mitral annulus, ?flow murmur...    Chol> on Simva40; [place] 9/14 showed TChol 150, TG 60, HDL 53, LDL 85;  Continue same + low chol/ low fat diet...    DM>  Stable on Metform500Bid w/ Labs 9/14 showing FBS=105 & A1c=6.2;  She saw DrHecker 6/14 w/ mild stable DM retinopathy noted; BS at home are all good 90-110 range.    GI>  She has  GERD, sm HH, schatzki ring (balloon dil 2/10 by DrJacobs), +Divertics, colon polyps; she saw DrJacobs 1/13 w/ recurrent dysphagia complaints & he did a third EGD w/ Schatzki ring redemonstarted & balloon dilatation performed; she also had colonoscopy mild divertics, otherw neg;  Her CC= gas, some constip & she gets relief from both w/ MOM that she takes once per month...    Ortho>  C/o some left shoulder pain for which she saw DrWeingold- given shot (helped some) & PT (helped but too $$)...    Osteoporosis> on Caltrate, MVI, Vit D 2000u daily; Hx TScore -3.3 per Gyn in L2 in 2010; she is INTOL to all oral Bisphos Rx & refused Reclast; f/u BMD here 3/13 showed -2.7 in L2 but Lspine was -1.9;  Offered Miacalcin vs Endocrine consult but she declines and prefers to wait til 3/15 for f/u BMD... We reviewed prob list, meds, xrays and labs> see below for updates >> OK FLU shot today; we discussed saving $$ via [facility]...  LABS 9/14:  FLP- at goals on Simva40;  Chems- wnl w/ BS=105, A1c=6.2 on Metform500Bid (continue same)...  ~  January 05, 2014:  88mo ROV & Jenny Madden reports a good interval and no new complaints or concerns... We reviewed the following medical problems during today's office visit >>     HBP>  Controlled on Amlodip10 w/ BP= 137/80 & even better at home she says; denies CP, palpit, cerebral ischemic symptoms, SOB, edema, etc...    AbnEcho> prev 2DEcho 2/09 showed mod thickening of MV leaflets, mild calcifAV, mild incr LVwall thickness & mild focal basal septal hypertrophy w/ mid cavity oblit during valsalva; f/u 2DEcho 4/14 showed mildLVH, good LVF w/ EF=70% & norm wall motion, Gr1DD, calcif mitral annulus, ?flow murmur...    Chol> on Simva40; FLP 3/15 showed TChol 162, TG 67, HDL 54, LDL 94;  Continue same + low chol/ low fat diet...    DM>  Stable on Metform500Bid w/ Labs 3/15 showing FBS=106 & A1c=6.4;  She saw DrHecker 6/14 w/ mild stable DM retinopathy noted; BS at home are all good 90-110  range.    GI>  She has GERD, sm HH, schatzki ring (balloon dil 2/10 by DrJacobs), +Divertics, colon polyps; she saw DrJacobs 1/13 w/ recurrent dysphagia complaints & he did a third EGD w/ Schatzki ring redemonstarted & balloon dilatation performed; she also had colonoscopy mild divertics, otherw neg;  Her CC= gas, some constip & she gets relief from both w/ MOM that she takes once per month...    Ortho>  C/o some left shoulder pain for which she saw DrWeingold- given  shot (helped some) & PT (helped but too $$)...    Osteopenia> on Caltrate, MVI, Vit D 2000u daily; Hx TScore -3.3 per Gyn in L2 in 2010; she is INTOL to all oral Bisphos Rx & refused Reclast; f/u BMD here 3/13 showed -2.7 in L2 but Lspine was -1.9;  Offered Miacalcin vs Endocrine consult but she declined and prefers to wait til 3/15 for f/u BMD=> done 4/15 & showed lowest Tscore -2.1 in left [facility] & when compared to 2013 there is a 1% loss of bone density in the hip & 5% loss in the spine; I rec Endocrine consult to consider Rx options... We reviewed prob list, meds, xrays and labs> see below for updates >>   CXR 3/15 showed norm heart size, clear lungs, sl scoliosis, NAD...  LABS 3/15:  FLP- at goals on Simva40;  Chems- ok on Metform500bid w/ BS=106 A1c=6.4;  CBC- wnl;  TSH=1.61;  VitD=48...  BONE DENSITY:  Tscore is -1.7 in LumbarSpine (5% lower than 2013) and -2.1 in leftFemNeck (1% lower than 2013); on Calcium, MVI, VitD supplements only...           Problem List:  ALLERGIC RHINITIS (ICD-477.9) - she uses Allegra Prn...  HYPERTENSION (ICD-401.9) - controlled on NORVASC 10mg /d and takes ASA 81mg /d as well...   ~  CXR 9/11 showed normal heart size, clear lungs, scoliosis, NAD... ~  9/12:  BP= 134/82 & similar at home; denies HA, fatigue, visual changes, CP, palipit, dizziness, syncope, dyspnea, edema, etc... ~  3/13:  BP= 112/60 & she remains asymptomatic... ~  9/13:  BP= 120/62 & she denies CP, palpit, SOB, edema, etc... ~   3/14: Controlled on Amlodip10 w/ BP= 120/64 & denies CP, palpit, cerebral ischemic symptoms, SOB, edema, etc. ~  9/14: Controlled on Amlodip10 w/ BP= 118/66 & denies CP, palpit, cerebral ischemic symptoms, SOB, edema, etc. ~  3/15: on Amlod10 + diet; BP= 137/80 7 even bettr at home she says; she remains asymptomatic...  ABNORMAL ELECTROCARDIOGRAM (ICD-794.31) & ECHOCARDIOGRAM, ABNORMAL (ICD-793.2) - she is asymtomatic without CP, palpit, SOB, dizziness or syncope... she is active w/ walking, treadmill, etc... ~  baseline EKG showed NSR, poor R progression V1-3 w/ NSSTTWA... no change in f/u tracings. ~  2DEcho in 2006 showed only some mild SAM, tr MR, tr AI, vigorous LVF w/ sm cavity & EF=75% (no change from 2002)... ~  2DEcho 2/09 showed:  Mod thickening of the MV leaflets, & mild calcif of the AoV... The left ventricle was mildly dilated. Overall left ventricular systolic function was normal. Left ventricular ejection fraction was estimated to be 60 %. There were no left ventricular regional wall motion abnormalities. Left ventricular wall thickness was mildly increased. There was mild focal basal septal hypertrophy. There was no systolic anterior motion of the mitral valve. There was Doppler evidence for dynamic left ventricular mid-cavity obstruction during Valsalva, with a peak velocity of 2.1 m/sec , and with a peak gradient of 18 mmHg. Doppler parameters were consistent with abnormal left ventricular relaxation. ~  2DEcho 4/14 showed [place], good LVF w/ EF=70% & norm wall motion, Gr1DD, calcif mitral annulus, ?flow murmur.  HYPERCHOLESTEROLEMIA (ICD-272.0) - on SIMVASTATIN 40mg /d + diet efforts. ~  FLP 7/08 showed TChol 165, Tg 65, HDL 45, LDL 107. ~  FLP 8/09 showed TChol 144, TG 63, HDL 52, LDL 80 ~  FLP 9/10 showed TChol 158, TG 60, HDL 47, LDL 99 ~  FLP 3/11 showed TChol 162, TG 63, HDL 53, LDL  97 ~  FLP 9/11 showed TChol 169, TG 58, HDL 45, LDL 113 ~  FLP 3/12 on Simva40 showed TChol  167, TG 51, HDL 52, LDL 105 ~  FLP 9/12 on Simva40 showed TChol 160, TG 59, HDL 52, LDL 96 ~  FLP 3/13 on Simva40 showed TChol 167, TG 68, HDL 55, LDL 98 ~  FLP 9/13 on Simva40 showed TChol 177, TG 75, HDL 53, LDL 109 ~  FLP 3/14 on Simva40 showed TChol 157, TG 63, HDL 46, LDL 99 ~  FLP 9/14 on Simva40 showed TChol 150, TG 60, HDL 53, LDL 85 ~  FLP 3/15 on simva40 showed TChol 162, TG 67, HDL 54, LDL 94  DM (ICD-250.00) - doing well on METFORMIN 500mg Bid + diet... BS at home all ~100-115 per pt. ~  labs 7/08 showed BS=111, HgA1c=6.1 ~  labs 2.09 showed BS= 112, HgA1c= 6.3.Marland KitchenMarland Kitchen continue same. ~  labs 8/09 showed BS= 111, HgA1c= 6.4 ~  labs 9/10 showed BS= 106, A1c= not done. ~  labs 3/11 showed BS= 93, A1c= 6.1 ~  labs 9/11 showed BS= 95 ~  Labs 9/12 showed BS= 107, A1c= 6.2 ~  Labs 3/13 showed BS= 99, A1c= 6.3 ~  Labs 9/13 on MetformBid showed BS= 101, A1c= 6.3 ~  Labs 3/14 on MetformBid showed BS= 103, A1c= 6.2 ~  6/14:  Ophthalmology check by DrHecker was pos for mild background retinopathy, stable... ~  Labs 9/14 on Metform500bid showed FBS=105 & A1c=6.2 ~  Labs 3/15 on Metform500Bid showed BS= 106, A1c=6.4  GERD (ICD-530.81) - she had EGD in 2002 showing 2cm HH, gastritis, duodenitis... controlled on OMEPRAZOLE 20mg /d... ~  EGD 2/10 by DrJacobs showed HH/ Schatzki ring w/ balloon dilatation... ~  EGD 2/12 by DrJacobs w/ Schatzki ring redomonstarted & another dilatation performed... ~  EGD 1/13 by DrJacobs w/ sm HH & Schatzki ring dilated once again...  DIVERTICULOSIS OF COLON (ICD-562.10) & COLONIC POLYPS (ICD-211.3) -  ~  last colonoscopy was 1/08 by DrSam showing divertics only... f/u 32yrs planned due to hx Bro w/ colon Ca... ~  Colonoscopy 1/13 by DrJacobs showed mild divertics, otherw neg... ~  3/14:  Her CC= gas, some constip & she gets relief from both w/ MOM that she takes once per month ("when the gas builds up"); "I drink coconut milk" ...  DEGENERATIVE JOINT DISEASE,  GENERALIZED (ICD-715.00)  OSTEOPOROSIS >> supposed to be on Calcium, MVI, Vit D 2000u daily... She is INTOL to all bisphoshonates by mouth... ~  BMD 10/10 [facility] DrMezer [facility] showed TScore -3.3 in L2 but we don't have full report... ~  BMD 3/13 showed TScores -1.9 (-2.7 at L2) in Spine and -2.0 in left [facility]; she considered Reclast but decided against it.... ~  3/14:  on Caltrate, MVI, Vit D 2000u daily; she is INTOL to all oral Bisphos Rx & refused Reclast; Offered Miacalcin vs Endocrine consult but she declines and prefers to wait til 3/15 for f/u BMD... ~  4/15:  on Caltrate, MVI, Vit D 2000u daily; Hx TScore -3.3 per Gyn in L2 in 2010; she is INTOL to all oral Bisphos Rx & refused Reclast; f/u BMD here 3/13 showed -2.7 in L2 but Lspine was -1.9;  Offered Miacalcin vs Endocrine consult but she declined and prefers to wait til 3/15 for f/u BMD=> done 4/15 & showed lowest Tscore -2.1 in left [facility] & when compared to 2013 there is a 1% loss of bone density in the hip &  5% loss in the spine; I rec Endocrine consult to consider Rx options...  VIT D DEFICIENCY - Vit D level 9/11= 23... rec to start 2000 u OTC Vit D supplement daily... ~  Labs 3/14 showed Vit D level = 48 ~  Labs 3/15 showed VitD level = 48  HEADACHES, HX OF (ICD-V13.8) - CTBrain in 2007 w/ some atrophy.  Hx of TINNITUS (ICD-388.30)  ANXIETY (ICD-300.00) - on ALPRAZOLAM 0.5mg  Prn...  Health Maintenance: ~  GI= DrJacobs as above... ~  GYN= sees DrMezer/ Toney Rakes since DrFore passed away- PAP, [facility] ~  Immunizations:  yearly flu shots... PNEUMOVAX 9/10 at age 41... TDAP given 3/11...   Past Surgical History  Procedure Laterality Date  . Cesarean section    . Foot surgery      bilateral for hammer toes    Outpatient Encounter Prescriptions as of 01/05/2014  Medication Sig  . ACCU-CHEK SOFTCLIX LANCETS lancets Use as directed to check blood glucose  . ALPRAZolam (XANAX) 0.5 MG  tablet Take 1/2 to 1 whole tablet by mouth three times daily as needed for nerves   . amLODipine (NORVASC) 10 MG tablet Take 1 tablet by mouth  daily  . aspirin 81 MG tablet Take by mouth daily.    . Blood Glucose Monitoring Suppl (ACCU-CHEK AVIVA) device Use as instructed  . Cholecalciferol (VITAMIN D) 2000 UNITS CAPS Take 1 capsule by mouth daily.    Marland Kitchen docusate sodium (COLACE) 100 MG capsule Take 100 mg by mouth daily.  . fexofenadine (ALLEGRA) 180 MG tablet Take 1 tablet (180 mg total) by mouth daily. Take 1 tablet by mouth once daily as needed for allergies  . fluticasone (FLONASE) 50 MCG/ACT nasal spray Place 2 sprays into the nose at bedtime.  Marland Kitchen glucose blood (ACCU-CHEK AVIVA) test strip Use as directed to check blood glucose  . Lancet Devices (LANCING DEVICE) MISC Use as directed to check blood glucose  . metFORMIN (GLUCOPHAGE) 500 MG tablet Take 1 tablet by mouth  twice a day with a meal  . omeprazole (PRILOSEC) 20 MG capsule Take 1 capsule (20 mg total) by mouth daily.  . Simethicone (GAS-X CHILDRENS PO) Take 1 tablet by mouth as needed.  . simvastatin (ZOCOR) 40 MG tablet Take 1 tablet by mouth  every evening    No Known Allergies   Current Medications, Allergies, Past Medical History, Past Surgical History, Family History, and Social History were reviewed in [employer] record.    Review of Systems   Constitutional:  Denies F/C/S, anorexia, unexpected weight change. HEENT:  No HA, visual changes, earache, sore throat, hoarseness; notes allergy symptoms. Resp:  No cough, sputum, hemoptysis; no SOB, tightness, wheezing. Cardio:  No CP, ch in DOE, orthopnea, edema; notes occas palpit- "it's gas" she says. GI:  Denies N/V/D/C or blood in stool; no reflux, abd pain, distention. GU:  No dysuria, freq, urgency, hematuria, or flank pain. MS:  Notes pain left post hip/ thigh related to exerc; good ROM, no rash, no swelling, etc. Neuro:  No tremors, seizures,  dizziness, syncope, weakness, numbness, gait abn. Skin:  No suspicious lesions or skin rash. Heme:  No adenopathy, bruising, bleeding. Psyche: Denies confusion, sleep disturbance, hallucinations, anxiety, depression.    Objective:   Physical Exam   WD, WN, 73 y/o BF in NAD... Vital Signs:  Reviewed... BP= 138/62 General:  Alert & oriented; pleasant & cooperative... HEENT:  [place]/AT, EOM-wnl, PERRLA, EACs-clear, TMs-wnl, NOSE-clear, THROAT-clear & wnl. Neck:  Supple w/ fair ROM; no JVD; normal carotid impulses w/o bruits; no thyromegaly or nodules palpated; no lymphadenopathy. Chest:  Clear to P & A; without wheezes/ rales/ or rhonchi heard... Heart:  Regular Rhythm; norm S1 & S2 without murmurs/ rubs/ or gallops detected... Abdomen:  Soft & nontender; normal bowel sounds; no organomegaly or masses palpated... Ext:  without deformities, mild arthritic changes; no varicose veins, +venous insuffic, no edema;  Neuro:  CNs intact; motor testing normal; sensory testing normal; gait normal & balance OK... Derm:  No lesions noted; no rash etc... Lymph:  No cervical, supraclavicular, axillary, or inguinal adenopathy palpated...   RADIOLOGY DATA:  Reviewed in the EPIC EMR & discussed w/ the patient...  LABORATORY DATA:  Reviewed in the EPIC EMR & discussed w/ the patient...    Assessment & Plan:    HBP>  Controlled on Amlodipine monotherapy; continue same + diet, exercise, keep wt down...  ABN EKG & ECHO>  SEE ABOVE, on ASA & Amlodipine, continue same, stay active etc...  HYPERCHOL>  Stable on Simva40 w/ FLP looking good, but needs better diet & some wt reduction...  DM>  Stable on the Metformin monotherapy, we reviewed diet as well...  GERD>  She denies reflux or dysphagia; uses the Omep 20mg  as needed...   Divertics, Polyps>  Her brother had colon cancer; f/u colon every 36yrs- last 1/13 and it was OK...  DJD>  She knows to minimize any NSAIDs & use Tylenol etc; she has f/u DrWeingold  for her left shoulder discomfort...  Osteoporosis>  BMD w/ osteopenia and she is INTOL to Bisphos Rx; Rec consult from Endocrinology for their input...  Vit D Defic>  On OTC Vit D supplementation...  Anxiety>  prn Alprazolam as needed...   Patient's Medications  New Prescriptions   No medications on file  Previous Medications   ACCU-CHEK SOFTCLIX LANCETS LANCETS    Use as directed to check blood glucose   ALPRAZOLAM (XANAX) 0.5 MG TABLET    Take 1/2 to 1 whole tablet by mouth three times daily as needed for nerves    AMLODIPINE (NORVASC) 10 MG TABLET    Take 1 tablet by mouth  daily   ASPIRIN 81 MG TABLET    Take by mouth daily.     BLOOD GLUCOSE MONITORING SUPPL (ACCU-CHEK AVIVA) DEVICE    Use as instructed   CHOLECALCIFEROL (VITAMIN D) 2000 UNITS CAPS    Take 1 capsule by mouth daily.     DOCUSATE SODIUM (COLACE) 100 MG CAPSULE    Take 100 mg by mouth daily.   FEXOFENADINE (ALLEGRA) 180 MG TABLET    Take 1 tablet (180 mg total) by mouth daily. Take 1 tablet by mouth once daily as needed for allergies   FLUTICASONE (FLONASE) 50 MCG/ACT NASAL SPRAY    Place 2 sprays into the nose at bedtime.   LANCET DEVICES (LANCING DEVICE) MISC    Use as directed to check blood glucose   METFORMIN (GLUCOPHAGE) 500 MG TABLET    Take 1 tablet by mouth  twice a day with a meal   OMEPRAZOLE (PRILOSEC) 20 MG CAPSULE    Take 1 capsule (20 mg total) by mouth daily.   SIMETHICONE (GAS-X CHILDRENS PO)    Take 1 tablet by mouth as needed.   SIMVASTATIN (ZOCOR) 40 MG TABLET    Take 1 tablet by mouth  every evening  Modified Medications   Modified Medication Previous Medication   GLUCOSE BLOOD (ACCU-CHEK AVIVA) TEST STRIP glucose  blood (ACCU-CHEK AVIVA) test strip      Use as directed to check blood glucose    Use as directed to check blood glucose  Discontinued Medications   No medications on file

## 2014-01-05 NOTE — Patient Instructions (Signed)
Today we updated your med list in our EPIC system...    Continue your current medications the same...    We refilled your meds per request...  Today we did your follow up CXR & FASTING blood work... We will sched a fiollow up Bone density Test...    We will contact you w/ the results when available...   Keep up the good work w/ diet & exercise...  Call for any questions or if we can be of service in any way.Marland KitchenMarland Kitchen

## 2014-01-07 ENCOUNTER — Telehealth: Payer: Self-pay | Admitting: Pulmonary Disease

## 2014-01-07 NOTE — Telephone Encounter (Signed)
Called and spoke with pt and she is aware of cxr and lab results per SN.  Pt voiced her understanding of these results.

## 2014-01-22 ENCOUNTER — Ambulatory Visit (INDEPENDENT_AMBULATORY_CARE_PROVIDER_SITE_OTHER): Payer: Commercial Managed Care - HMO | Admitting: Gynecology

## 2014-01-22 ENCOUNTER — Encounter: Payer: Self-pay | Admitting: Gynecology

## 2014-01-22 VITALS — BP 130/70 | Ht 64.0 in | Wt 176.0 lb

## 2014-01-22 DIAGNOSIS — Z8639 Personal history of other endocrine, nutritional and metabolic disease: Secondary | ICD-10-CM

## 2014-01-22 DIAGNOSIS — M8580 Other specified disorders of bone density and structure, unspecified site: Secondary | ICD-10-CM

## 2014-01-22 DIAGNOSIS — Z780 Asymptomatic menopausal state: Secondary | ICD-10-CM

## 2014-01-22 DIAGNOSIS — M899 Disorder of bone, unspecified: Secondary | ICD-10-CM

## 2014-01-22 DIAGNOSIS — N952 Postmenopausal atrophic vaginitis: Secondary | ICD-10-CM

## 2014-01-22 DIAGNOSIS — M949 Disorder of cartilage, unspecified: Secondary | ICD-10-CM

## 2014-01-22 DIAGNOSIS — N942 Vaginismus: Secondary | ICD-10-CM

## 2014-01-22 NOTE — Patient Instructions (Addendum)
Shingles Vaccine What You Need to Know WHAT IS SHINGLES?  Shingles is a painful skin rash, often with blisters. It is also called Herpes Zoster or just Zoster.  A shingles rash usually appears on one side of the face or body and lasts from 2 to 4 weeks. Its main symptom is pain, which can be quite severe. Other symptoms of shingles can include fever, headache, chills, and upset stomach. Very rarely, a shingles infection can lead to pneumonia, hearing problems, blindness, brain inflammation (encephalitis), or death.  For about 1 person in 5, severe pain can continue even after the rash clears up. This is called post-herpetic neuralgia.  Shingles is caused by the Varicella Zoster virus. This is the same virus that causes chickenpox. Only someone who has had a case of chickenpox or rarely, has gotten chickenpox vaccine, can get shingles. The virus stays in your body. It can reappear many years later to cause a case of shingles.  You cannot catch shingles from another person with shingles. However, a person who has never had chickenpox (or chickenpox vaccine) could get chickenpox from someone with shingles. This is not very common.  Shingles is far more common in people 50 and older than in younger people. It is also more common in people whose immune systems are weakened because of a disease such as cancer or drugs such as steroids or chemotherapy.  At least 1 million people get shingles per year in the [place]. SHINGLES VACCINE  A vaccine for shingles was licensed in 2006. In clinical trials, the vaccine reduced the risk of shingles by 50%. It can also reduce the pain in people who still get shingles after being vaccinated.  A single dose of shingles vaccine is recommended for adults 60 years of age and older. SOME PEOPLE SHOULD NOT GET SHINGLES VACCINE OR SHOULD WAIT A person should not get shingles vaccine if he or she:  Has ever had a life-threatening allergic reaction to gelatin, the  antibiotic neomycin, or any other component of shingles vaccine. Tell your caregiver if you have any severe allergies.  Has a weakened immune system because of current:  AIDS or another disease that affects the immune system.  Treatment with drugs that affect the immune system, such as prolonged use of high-dose steroids.  Cancer treatment, such as radiation or chemotherapy.  Cancer affecting the bone marrow or lymphatic system, such as leukemia or lymphoma.  Is pregnant, or might be pregnant. Women should not become pregnant until at least 4 weeks after getting shingles vaccine. Someone with a minor illness, such as a cold, may be vaccinated. Anyone with a moderate or severe acute illness should usually wait until he or she recovers before getting the vaccine. This includes anyone with a temperature of 101.3 F (38 C) or higher. WHAT ARE THE RISKS FROM SHINGLES VACCINE?  A vaccine, like any medicine, could possibly cause serious problems, such as severe allergic reactions. However, the risk of a vaccine causing serious harm, or death, is extremely small.  No serious problems have been identified with shingles vaccine. Mild Problems  Redness, soreness, swelling, or itching at the site of the injection (about 1 person in 3).  Headache (about 1 person in 70). Like all vaccines, shingles vaccine is being closely monitored for unusual or severe problems. WHAT IF THERE IS A MODERATE OR SEVERE REACTION? What should I look for? Any unusual condition, such as a severe allergic reaction or a high fever. If a severe allergic reaction   occurred, it would be within a few minutes to an hour after the shot. Signs of a serious allergic reaction can include difficulty breathing, weakness, hoarseness or wheezing, a fast heartbeat, hives, dizziness, paleness, or swelling of the throat. What should I do?  Call your caregiver, or get the person to a caregiver right away.  Tell the caregiver what  happened, the date and time it happened, and when the vaccination was given.  Ask the caregiver to report the reaction by filing a Vaccine Adverse Event Reporting System (VAERS) form. Or, you can file this report through the VAERS web site at www.vaers.SamedayNews.es or by calling 929 582 3638. VAERS does not provide medical advice. HOW CAN I LEARN MORE?  Ask your caregiver. He or she can give you the vaccine package insert or suggest other sources of information.  Contact the Centers for Disease Control and Prevention (CDC):  Call 513-156-7243 (1-800-CDC-INFO).  Visit the CDC website at http://hunter.com/ CDC Shingles Vaccine VIS (07/13/08) Document Released: 07/22/2006 Document Revised: 12/17/2011 Document Reviewed: 01/14/2013 [facility] Patient Information 2014 [place]. Transvaginal Ultrasound Transvaginal ultrasound is a pelvic ultrasound, using a metal probe that is placed in the vagina, to look at a women's female organs. Transvaginal ultrasound is a method of seeing inside the pelvis of a woman. The ultrasound machine sends out sound waves from the transducer (probe). These sound waves bounce off body structures (like an echo) to create a picture. The picture shows up on a monitor. It is called transvaginal because the probe is inserted into the vagina. There should be very little discomfort from the vaginal probe. This test can also be used during pregnancy. Endovaginal ultrasound is another name for a transvaginal ultrasound. In a transabdominal ultrasound, the probe is placed on the outside of the belly. This method gives pictures that are lower quality than pictures from the transvaginal technique. Transvaginal ultrasound is used to look for problems of the female genital tract. Some such problems include:  Infertility problems.  Congenital (birth defect) malformations of the uterus and ovaries.  Tumors in the uterus.  Abnormal bleeding.  Ovarian tumors and  cysts.  Abscess (inflamed tissue around pus) in the pelvis.  Unexplained abdominal or pelvic pain.  Pelvic infection. DURING PREGNANCY, TRANSVAGINAL ULTRASOUND MAY BE USED TO LOOK AT:  Normal pregnancy.  Ectopic pregnancy (pregnancy outside the uterus).  Fetal heartbeat.  Abnormalities in the pelvis, that are not seen well with transabdominal ultrasound.  Suspected twins or multiples.  Impending miscarriage.  Problems with the cervix (incompetent cervix, not able to stay closed and hold the baby).  When doing an amniocentesis (removing fluid from the pregnancy sac, for testing).  Looking for abnormalities of the baby.  Checking the growth, development, and age of the fetus.  Measuring the amount of fluid in the amniotic sac.  When doing an external version of the baby (moving baby into correct position).  Evaluating the baby for problems in high risk pregnancies (biophysical profile).  Suspected fetal demise (death). Sometimes a special ultrasound method called Saline Infusion Sonography (SIS) is used for a more accurate look at the uterus. Sterile saline (salt water) is injected into the uterus of non-pregnant patients to see the inside of the uterus better. SIS is not used on pregnant women. The vaginal probe can also assist in obtaining biopsies of abnormal areas, in draining fluid from cysts on the ovary, and in finding IUDs (intrauterine device, birth control) that cannot be located. PREPARATION FOR TEST A transvaginal ultrasound is done with  the bladder empty. The transabdominal ultrasound is done with your bladder full. You may be asked to drink several glasses of water before that exam. Sometimes, a transabdominal ultrasound is done just after a transvaginal ultrasound, to look at organs in your abdomen. PROCEDURE  You will lie down on a table, with your knees bent and your feet in foot holders. The probe is covered with a condom. A sterile lubricant is put into the  vagina and on the probe. The lubricant helps transmit the sound waves and avoid irritating the vagina. Your caregiver will move the probe inside the vaginal cavity to scan the pelvic structures. A normal test will show a normal pelvis and normal contents. An abnormal test will show abnormalities of the pelvis, placenta, or baby. ABNORMAL RESULTS MAY BE DUE TO:  Growths or tumors in the:  Uterus.  Ovaries.  Vagina.  Other pelvic structures.  Non-cancerous growths of the uterus and ovaries.  Twisting of the ovary, cutting off blood supply to the ovary (ovarian torsion).  Areas of infection, including:  Pelvic inflammatory disease.  Abscess in the pelvis.  Locating an IUD. PROBLEMS FOUND IN PREGNANT WOMEN MAY INCLUDE:  Ectopic pregnancy (pregnancy outside the uterus).  Multiple pregnancies.  Early dilation (opening) of the cervix. This may indicate an incompetent cervix and early delivery.  Impending miscarriage.  Fetal death.  Problems with the placenta, including:  Placenta has grown over the opening of the womb (placenta previa).  Placenta has separated early in the womb (placental abruption).  Placenta grows into the muscle of the uterus (placenta accreta).  Tumors of pregnancy, including gestational trophoblastic disease. This is an abnormal pregnancy, with no fetus. The uterus is filled with many grape-like cysts that could sometimes be cancerous.  Incorrect position of the fetus (breech, vertex).  Intrauterine fetal growth retardation (IUGR) (poor growth in the womb).  Fetal abnormalities or infection. RISKS AND COMPLICATIONS There are no known risks to the ultrasound procedure. There is no X-ray used when doing an ultrasound. Document Released: 09/05/2004 Document Revised: 12/17/2011 Document Reviewed: 08/24/2009 [facility] Patient Information 2014 [place], [place].

## 2014-01-22 NOTE — Progress Notes (Addendum)
Jenny Madden [birth year] [id number]   History:    73 y.o.  for GYN followup exam. Patient with known history of osteopenia. Patient last bone density study 2 years ago and demonstrated the lowest T score was -1.9 at the [facility] spine with some mild scoliosis noted on imaging. Her left femoral neck was -2.0 and right femoral neck -1.5. Patient stated that her PCP is scheduled in the next few weeks and have asked her for a copy for our records. Patient's had issues of vaginal atrophy but is not interested in any treatment at the present time.She's been followed by her internist Dr. Teressa Lower (hypertension, hyperlipidemia, type 2 diabetes) who has recently done all her lab work. Her recent vitamin D level was normal as well.It appears that patient did have esophageal stricture history has not been able to tolerate oral bisphosphonates in the past. She would be an ideal candidate for Reclast IV once a year. She states that she is taking vitamin D 2000 units daily. She has a past history of colon polyps and her last colonoscopy was this year along with an EGD which patient states was normal. Her last Pap smear was in April 2012.   Past medical history,surgical history, family history and social history were all reviewed and documented in the EPIC chart.  Gynecologic History No LMP recorded. Patient is postmenopausal. Contraception: post menopausal status Last Pap: 2012. Results were: normal Last mammogram: 2013. Results were: normal  Obstetric History OB History  Gravida Para Term Preterm AB SAB TAB Ectopic Multiple Living  3 1 1  2     1     # Outcome Date GA Lbr Len/2nd Weight Sex Delivery Anes PTL Lv  3 ABT           2 ABT           1 TRM     F CS  N Y       ROS: A ROS was performed and pertinent positives and negatives are included in the history.  GENERAL: No fevers or chills. HEENT: No change in vision, no earache, sore throat or sinus congestion. NECK: No pain or stiffness.  CARDIOVASCULAR: No chest pain or pressure. No palpitations. PULMONARY: No shortness of breath, cough or wheeze. GASTROINTESTINAL: No abdominal pain, nausea, vomiting or diarrhea, melena or bright red blood per rectum. GENITOURINARY: No urinary frequency, urgency, hesitancy or dysuria. MUSCULOSKELETAL: No joint or muscle pain, no back pain, no recent trauma. DERMATOLOGIC: No rash, no itching, no lesions. ENDOCRINE: No polyuria, polydipsia, no heat or cold intolerance. No recent change in weight. HEMATOLOGICAL: No anemia or easy bruising or bleeding. NEUROLOGIC: No headache, seizures, numbness, tingling or weakness. PSYCHIATRIC: No depression, no loss of interest in normal activity or change in sleep pattern.     Exam: chaperone present  BP 130/70  Ht 5\' 4"  (1.626 m)  Wt 176 lb (79.833 kg)  BMI 30.20 kg/m2  Body mass index is 30.2 kg/(m^2).  General appearance : Well developed well nourished female. No acute distress HEENT: Neck supple, trachea midline, no carotid bruits, no thyroidmegaly Lungs: Clear to auscultation, no rhonchi or wheezes, or rib retractions  Heart: Regular rate and rhythm, no murmurs or gallops Breast:Examined in sitting and supine position were symmetrical in appearance, no palpable masses or tenderness,  no skin retraction, no nipple inversion, no nipple discharge, no skin discoloration, no axillary or supraclavicular lymphadenopathy Abdomen: no palpable masses or tenderness, no rebound or guarding Extremities: no edema  or skin discoloration or tenderness  Pelvic:  Bartholin, Urethra, Skene Glands: Within normal limits             Vagina: No gross lesions or discharge, vaginal atrophy  Cervix: No gross lesions or discharge  Uterus limited due to vaginismus Adnexa  limited due to vaginismus Anus and perineum  normal   Rectovaginal  normal sphincter tone without palpated masses or tenderness             Hemoccult PCP provides     Assessment/Plan:  73 y.o. female who is  overdue for mammogram. She is scheduled for bone density study next 2 weeks. Patient with osteopenia. Currently only taking calcium vitamin D. Recent vitamin D level was normal. Patient's brother with history of colon cancer at age 42. Patient had a colonoscopy 2012 and benign polyps and then removed. Patient declined shingles vaccine. Due to patient's vaginismus and  incomplete pelvic exam she'll return back to the office for an ultrasound within the next week or so. Pap smear not done in accordance to the new guidelines.   Note: This dictation was prepared with  Dragon/digital dictation along withSmart phrase technology. Any transcriptional errors that result from this process are unintentional.   Terrance Mass MD, 10:22 AM 01/22/2014

## 2014-01-26 ENCOUNTER — Ambulatory Visit (INDEPENDENT_AMBULATORY_CARE_PROVIDER_SITE_OTHER)
Admission: RE | Admit: 2014-01-26 | Discharge: 2014-01-26 | Disposition: A | Discharge status: Home / Self Care | Payer: Medicare HMO | Source: Ambulatory Visit | Attending: Pulmonary Disease | Admitting: Pulmonary Disease

## 2014-01-26 DIAGNOSIS — M8580 Other specified disorders of bone density and structure, unspecified site: Secondary | ICD-10-CM

## 2014-01-26 DIAGNOSIS — M899 Disorder of bone, unspecified: Secondary | ICD-10-CM

## 2014-01-26 DIAGNOSIS — M949 Disorder of cartilage, unspecified: Secondary | ICD-10-CM

## 2014-02-02 ENCOUNTER — Telehealth: Payer: Self-pay | Admitting: Pulmonary Disease

## 2014-02-02 DIAGNOSIS — M8580 Other specified disorders of bone density and structure, unspecified site: Secondary | ICD-10-CM

## 2014-02-02 NOTE — Telephone Encounter (Signed)
i have reviewed the BMD results with the pt and she is aware that these have been sent to Dr. Toney Rakes.  She has appt with him on Friday.  Pt is aware that order has been placed for her to see Dr. Otho Najjar for consult.  This order has been placed.

## 2014-02-05 ENCOUNTER — Ambulatory Visit (INDEPENDENT_AMBULATORY_CARE_PROVIDER_SITE_OTHER): Payer: Commercial Managed Care - HMO | Admitting: Gynecology

## 2014-02-05 ENCOUNTER — Encounter: Payer: Self-pay | Admitting: Gynecology

## 2014-02-05 ENCOUNTER — Ambulatory Visit (INDEPENDENT_AMBULATORY_CARE_PROVIDER_SITE_OTHER): Payer: Commercial Managed Care - HMO

## 2014-02-05 DIAGNOSIS — M949 Disorder of cartilage, unspecified: Secondary | ICD-10-CM

## 2014-02-05 DIAGNOSIS — M899 Disorder of bone, unspecified: Secondary | ICD-10-CM

## 2014-02-05 DIAGNOSIS — N8300 Follicular cyst of ovary, unspecified side: Secondary | ICD-10-CM

## 2014-02-05 DIAGNOSIS — M8580 Other specified disorders of bone density and structure, unspecified site: Secondary | ICD-10-CM

## 2014-02-05 DIAGNOSIS — D259 Leiomyoma of uterus, unspecified: Secondary | ICD-10-CM

## 2014-02-05 DIAGNOSIS — N942 Vaginismus: Secondary | ICD-10-CM

## 2014-02-05 NOTE — Progress Notes (Signed)
   Patient was seen in the office on 01/22/2014 C. notes for full details. She was asked to come into the office today for an ultrasound due the fact that she had vaginismus during pelvic exam and an adequate exam was not possible.  Ultrasound today Uterus measured 5.8 x 4.1 x 3.8 cm with endometrial stripe 2.9 mm. A single calcified fibroid measuring 9 x 4 mm was noted. Left ovary was normal. Right ovarian ankle free thin wall a vascular cyst measuring 13 x 10 mm was noted. No free fluid in the cul-de-sac.  Patient scheduled to see her endocrinologist in 2 weeks to discuss her recent bone density study. I reviewed briefly discussed with the patient. It appears that her lowest T. scores of left femoral neck with a value -2.1 but a FRAX Score was not calculated?? Patient is taking her calcium with vitamin D. Patient not candidate for oral bisphosphonate because of prior history esophageal irritation and stricture. If a FRAX score is calculated and she exceeds the threshold that she would be a candidate for intravenous antiresorptive agent such as  Reclast IV once a year ago the monoclonal antibody  Prolia every 6 months.

## 2014-02-16 ENCOUNTER — Ambulatory Visit (INDEPENDENT_AMBULATORY_CARE_PROVIDER_SITE_OTHER): Payer: Medicare HMO | Admitting: Internal Medicine

## 2014-02-16 ENCOUNTER — Encounter: Payer: Self-pay | Admitting: Internal Medicine

## 2014-02-16 VITALS — BP 126/60 | HR 94 | Temp 98.4°F | Resp 12 | Ht 64.0 in | Wt 176.8 lb

## 2014-02-16 DIAGNOSIS — M8580 Other specified disorders of bone density and structure, unspecified site: Secondary | ICD-10-CM

## 2014-02-16 DIAGNOSIS — M899 Disorder of bone, unspecified: Secondary | ICD-10-CM

## 2014-02-16 DIAGNOSIS — M949 Disorder of cartilage, unspecified: Secondary | ICD-10-CM

## 2014-02-16 NOTE — Progress Notes (Signed)
Patient ID: Jenny Madden, female   DOB: [birth year], 73 y.o.   MRN: [id number]   HPI  Jenny Madden is a 73 y.o.-year-old female, referred by her PCP, Dr. Lenna Gilford, for evaluation for osteopenia.  Pt was dx with Osteopenia in 2005. She denies any fractures or falls. No dizziness/vertigo/orthostasis.  I reviewed pt's DEXA scans: Date L1-L4 T score - scoliosis FN T score 33% distal Radius  01/26/2014 -1.7 L: -2.1 R: -1.7 n/a  01/02/2012 -1.9 L: -2.0 R: -1.5 n/a  2015 FRAX calculated: - MOF 10-year risk: 12% - Hip fracture 10-year risk: 2.9%  She was on bisphosphonates 10 years ago: - Fosamax x1 mo - Actonel x1 mo Could not tolerate them 2/2 chest tightness. She refused to take any other po Bisphosphonate.  She has a h/o Es stricture >> dilation 3 years ago.  No h/o hyper/hypocalcemia. No h/o hyperparathyroidism. No h/o kidney stones. Lab Results  Component Value Date   CALCIUM 10.1 01/05/2014   CALCIUM 9.9 07/07/2013   CALCIUM 10.4 12/31/2012   CALCIUM 9.8 07/02/2012   CALCIUM 9.8 02/05/2012   CALCIUM 10.0 12/27/2011   CALCIUM 9.7 06/28/2011   CALCIUM 9.7 12/27/2010   CALCIUM 9.9 06/28/2010   CALCIUM 9.8 12/28/2009   No h/o thyrotoxicosis. Reviewed TSH recent levels:  Lab Results  Component Value Date   TSH 1.61 01/05/2014   TSH 1.68 12/31/2012   TSH 1.36 12/27/2011   TSH 1.29 12/27/2010   TSH 1.31 06/28/2010   + h/o vitamin D deficiency. Last vit D level was 48 in 01/05/2014. Pt is on calcium 600 mg every other day and vitamin D 2000 IU. She also eats dairy and green, leafy, vegetables.   No weight bearing exercises. [facility]   She does not take high vitamin A doses.  She was on Prilosec (prn) but stopped b/c the possible bone effects.  No h/o CKD. Last BUN/Cr: Lab Results  Component Value Date   BUN 9 01/05/2014   CREATININE 0.9 01/05/2014   Pt does not have a FH of osteoporosis. No FH of kyphosis.   Menopause was at 73 y/o. She was on estrogen  (HRT) until she was 73 y/o.  ROS: Constitutional: no weight gain/loss, no fatigue, no subjective hyperthermia/hypothermia, + nocturia Eyes: no blurry vision, no xerophthalmia ENT: no sore throat, no nodules palpated in throat, no dysphagia/odynophagia, no hoarseness Cardiovascular: no CP/SOB/palpitations/leg swelling Respiratory: no cough/SOB Gastrointestinal: no N/V/D/+C/+ heartburn Musculoskeletal: no muscle/joint aches Skin: no rashes Neurological: no tremors/numbness/tingling/dizziness Psychiatric: no depression/anxiety  Past Medical History  Diagnosis Date  . Allergic rhinitis, cause unspecified   . Unspecified essential hypertension   . Nonspecific abnormal electrocardiogram (ECG) (EKG)   . Nonspecific (abnormal) findings on radiological and other examination of other intrathoracic organs   . Pure hypercholesterolemia   . Type II or unspecified type diabetes mellitus without mention of complication, not stated as uncontrolled   . Other dysphagia   . Diverticulosis of colon (without mention of hemorrhage)   . Benign neoplasm of colon   . Family history of malignant neoplasm of gastrointestinal tract   . Generalized osteoarthrosis, unspecified site   . Generalized headaches   . Unspecified tinnitus   . Anxiety state, unspecified    Past Surgical History  Procedure Laterality Date  . Cesarean section    . Foot surgery      bilateral for hammer toes   History   Social History  . Marital Status: Married  Spouse Name: bob x 37 yrs    Number of Children: 3   Occupational History  . [employer] - retired    Social History Main Topics  . Smoking status: Former Smoker    Types: Cigarettes    Quit date: 10/09/1999  . Smokeless tobacco: Never Used  . Alcohol Use: Yes     Comment: social use - scotch once a week  . Drug Use: No   Social History Narrative   Married to husband, [name] x62yrs   3 children - 1 son w/ Hodgkin's, 2 daughters alive and well   High school  education   Current Outpatient Prescriptions on File Prior to Visit  Medication Sig Dispense Refill  . ACCU-CHEK SOFTCLIX LANCETS lancets Use as directed to check blood glucose      . ALPRAZolam (XANAX) 0.5 MG tablet Take 1/2 to 1 whole tablet by mouth three times daily as needed for nerves       . amLODipine (NORVASC) 10 MG tablet Take 1 tablet by mouth  daily  90 tablet  3  . aspirin 81 MG tablet Take by mouth daily.        . Blood Glucose Monitoring Suppl (ACCU-CHEK AVIVA) device Use as instructed  1 each  0  . Cholecalciferol (VITAMIN D) 2000 UNITS CAPS Take 1 capsule by mouth daily.        Marland Kitchen docusate sodium (COLACE) 100 MG capsule Take 100 mg by mouth daily.      . fexofenadine (ALLEGRA) 180 MG tablet Take 1 tablet (180 mg total) by mouth daily. Take 1 tablet by mouth once daily as needed for allergies  90 tablet  3  . fluticasone (FLONASE) 50 MCG/ACT nasal spray Place 2 sprays into the nose at bedtime.  16 g  11  . glucose blood (ACCU-CHEK AVIVA) test strip Use as directed to check blood glucose  100 each  3  . Lancet Devices (LANCING DEVICE) MISC Use as directed to check blood glucose  1 each  3  . metFORMIN (GLUCOPHAGE) 500 MG tablet Take 1 tablet by mouth  twice a day with a meal  180 tablet  3  . Simethicone (GAS-X CHILDRENS PO) Take 1 tablet by mouth as needed.      . simvastatin (ZOCOR) 40 MG tablet Take 1 tablet by mouth  every evening  90 tablet  3  . omeprazole (PRILOSEC) 20 MG capsule Take 1 capsule (20 mg total) by mouth daily.  180 capsule  1  . [DISCONTINUED] Alum & Mag Hydroxide-Simeth (MAGIC MOUTHWASH) SOLN 1 teaspoon gargle and swallow four times a day        No current facility-administered medications on file prior to visit.   No Known Allergies Family History  Problem Relation Age of Onset  . Stroke Father   . Lung cancer Father   . Coronary artery disease Father   . Hypertension Mother   . Heart attack Mother   . Colon cancer Brother 38   PE: BP 126/60   Pulse 94  Temp(Src) 98.4 F (36.9 C) (Oral)  Resp 12  Ht 5\' 4"  (1.626 m)  Wt 176 lb 12.8 oz (80.196 kg)  BMI 30.33 kg/m2  SpO2 97% Wt Readings from Last 3 Encounters:  02/16/14 176 lb 12.8 oz (80.196 kg)  01/22/14 176 lb (79.833 kg)  01/05/14 174 lb 12.8 oz (79.289 kg)   Constitutional: overweight, in NAD. No kyphosis. Eyes: PERRLA, EOMI, no exophthalmos ENT: moist mucous membranes, no thyromegaly, no  cervical lymphadenopathy Cardiovascular: RRR, No MRG Respiratory: CTA B Gastrointestinal: abdomen soft, NT, ND, BS+ Musculoskeletal: no deformities, strength intact in all 4 Skin: moist, warm, no rashes Neurological: no tremor with outstretched hands, DTR normal in all 4  Assessment: 1. Osteoporosis  Plan: 1. Osteoporosis - likely postmenopausal  - We reviewed her DEXA scans together and explained the T score and we calculated her FRAX scores, which show that she can be followed w/o antiresorptive tx as she is below the Tx thresholds of 20% or MOF and 3% for hip fx. She is active and does not have fall risk factors.  - we reviewed her dietary and supplemental calcium and vitamin D intake, which I believe are adequate. Her vitamin D level is normal, but she may not enough calcium >> advised her to calculate and try to get at least 1200 mg of calcium daily - given her specific instructions about food sources for these - see pt instructions  - discussed fall precautions  - given handout from [place] [facility]: weight bearing exercises - advised to do this every day or at least 5/7 days - we discussed about different possible directions for management. We discussed about maintaining a good amount of protein in her diet. The recommended daily protein intake is ~0.8 g per kilogram per day. I advised her to try to aim for this amount, since a diet low in proteins can exacerbate osteoporosis. Also, avoid smoking or >2 drinks of alcohol a day. - will check a new DEXA scan in 2  years but will see her back in 1 year

## 2014-02-16 NOTE — Patient Instructions (Signed)
Please return in 1 year. We will need another DEXA scan in 2 years. For now, I would suggest:  weight bearing exercises  Make sure you get 1200 mg calcium a day  Continue vitamin D 2000 units daily  How Can I Prevent Falls? Men and women with osteoporosis need to take care not to fall down. Falls can break bones. Some reasons people fall are: Poor vision  Poor balance  Certain diseases that affect how you walk  Some types of medicine, such as sleeping pills.  Some tips to help prevent falls outdoors are: Use a cane or walker  Wear rubber-soled shoes so you don't slip  Walk on grass when sidewalks are slippery  In winter, put salt or kitty litter on icy sidewalks.  Some ways to help prevent falls indoors are: Keep rooms free of clutter, especially on floors  Use plastic or carpet runners on slippery floors  Wear low-heeled shoes that provide good support  Do not walk in socks, stockings, or slippers  Be sure carpets and area rugs have skid-proof backs or are tacked to the floor  Be sure stairs are well lit and have rails on both sides  Put grab bars on bathroom walls near tub, shower, and toilet  Use a rubber bath mat in the shower or tub  Keep a flashlight next to your bed  Use a sturdy step stool with a handrail and wide steps  Add more lights in rooms (and night lights) Buy a cordless phone to keep with you so that you don't have to rush to the phone       when it rings and so that you can call for help if you fall.   (adapted from http://www.niams.NightlifePreviews.se)  Dietary sources of calcium and vitamin D:  Calcium content (mg) - http://www.niams.MoviePins.co.za  Fortified oatmeal, 1 packet 350  Sardines, canned in oil, with edible bones, 3 oz. 324  Cheddar cheese, 1 oz. shredded 306  Milk, nonfat, 1 cup 302  Milkshake, 1 cup 300  Yogurt, plain, low-fat, 1 cup 300  Soybeans, cooked, 1 cup 261  Tofu,  firm, with calcium,  cup 204  Orange juice, fortified with calcium, 6 oz. 200-260 (varies)  Salmon, canned, with edible bones, 3 oz. 181  Pudding, instant, made with 2% milk,  cup 153  Baked beans, 1 cup [place], 1% milk fat, 1 cup 138  Spaghetti, lasagna, 1 cup 125  Frozen yogurt, vanilla, soft-serve,  cup 103  Ready-to-eat cereal, fortified with calcium, 1 cup 100-1,000 (varies)  Cheese pizza, 1 slice 902  Fortified waffles, 2 100  Turnip greens, boiled,  cup 99  Broccoli, raw, 1 cup 90  Ice cream, vanilla,  cup 85  Soy or rice milk, fortified with calcium, 1 cup 80-500 (varies)   Vitamin D content (International Units, IU) - https://www.ars.usda.gov Cod liver oil, 1 tablespoon 1,360  Swordfish, cooked, 3 oz 566  Salmon (sockeye), cooked, 3 oz 447  Tuna fish, canned in water, drained, 3 oz 154  Orange juice fortified with vitamin D, 1 cup (check product labels, as amount of added vitamin D varies) 137  Milk, nonfat, reduced fat, and whole, vitamin D-fortified, 1 cup 115-124  Yogurt, fortified with 20% of the daily value for vitamin D, 6 oz 80  Margarine, fortified, 1 tablespoon 60  Sardines, canned in oil, drained, 2 sardines 46  Liver, beef, cooked, 3 oz 42  Egg, 1 large (vitamin D is found in yolk) 41  Ready-to-eat cereal, fortified with 10% of the daily value for vitamin D, 0.75-1 cup  40  Cheese, Swiss, 1 oz 6   Exercise for Strong Bones (from [place]) There are two types of exercises that are important for building and maintaining bone density:  weight-bearing and muscle-strengthening exercises. Weight-bearing Exercises These exercises include activities that make you move against gravity while staying upright. Weight-bearing exercises can be high-impact or low-impact. High-impact weight-bearing exercises help build bones and keep them strong. If you have broken a bone due to osteoporosis or are at risk of breaking a bone, you may  need to avoid high-impact exercises. If you're not sure, you should check with your healthcare provider. Examples of high-impact weight-bearing exercises are:   Dancing   Doing high-impact aerobics   Hiking   Jogging/running   Jumping Rope   Stair climbing   Tennis Low-impact weight-bearing exercises can also help keep bones strong and are a safe alternative if you cannot do high-impact exercises. Examples of low-impact weight-bearing exercises are:   Using elliptical training machines   Doing low-impact aerobics   Using stair-step machines   Fast walking on a treadmill or outside Muscle-Strengthening Exercises These exercises include activities where you move your body, a weight or some other resistance against gravity. They are also known as resistance exercises and include:   Lifting weights   Using elastic exercise bands   Using weight machines   Lifting your own body weight   Functional movements, such as standing and rising up on your toes Yoga and Pilates can also improve strength, balance and flexibility. However, certain positions may not be safe for people with osteoporosis or those at increased risk of broken bones. For example, exercises that have you bend forward may increase the chance of breaking a bone in the spine. A physical therapist should be able to help you learn which exercises are safe and appropriate for you. Non-Impact Exercises Non-impact exercises can help you to improve balance, posture and how well you move in everyday activities. These exercises can also help to increase muscle strength and decrease the risk of falls and broken bones. Some of these exercises include:   Balance exercises that strengthen your legs and test your balance, such as Tai Chi, can decrease your risk of falls.   Posture exercises that improve your posture and reduce rounded or "sloping" shoulders can help you decrease the chance of breaking a bone, especially in the spine.   Functional  exercises that improve how well you move can help you with everyday activities and decrease your chance of falling and breaking a bone. For example, if you have trouble getting up from a chair or climbing stairs, you should do these activities as exercises. A physical therapist can teach you balance, posture and functional exercises. Starting a New Exercise Program If you haven't exercised regularly for a while, check with your healthcare provider before beginning a new exercise program-particularly if you have health problems such as heart disease, diabetes or high blood pressure. If you're at high risk of breaking a bone, you should work with a physical therapist to develop a safe exercise program. Once you have your healthcare provider's approval, start slowly. If you've already broken bones in the spine because of osteoporosis, be very careful to avoid activities that require reaching down, bending forward, rapid twisting motions, heavy lifting and those that increase your chance of a fall. As you get started, your muscles may feel sore for a day  or two after you exercise. If soreness lasts longer, you may be working too hard and need to ease up. Exercises should be done in a pain-free range of motion. How Much Exercise Do You Need? Weight-bearing exercises 30 minutes on most days of the week. Do a 30-minutesession or multiple sessions spread out throughout the day. The benefits to your bones are the same.   Muscle-strengthening exercises Two to three days per week. If you don't have much time for strengthening/resistance training, do small amounts at a time. You can do just one body part each day. For example do arms one day, legs the next and trunk the next. You can also spread these exercises out during your normal day.  Balance, posture and functional exercises Every day or as often as needed. You may want to focus on one area more than the others. If you have fallen or lose your balance, spend time  doing balance exercises. If you are getting rounded shoulders, work more on posture exercises. If you have trouble climbing stairs or getting up from the couch, do more functional exercises. You can also perform these exercises at one time or spread them during your day. Work with a phyiscal therapist to learn the right exercises for you.

## 2014-03-11 ENCOUNTER — Telehealth: Payer: Self-pay | Admitting: Pulmonary Disease

## 2014-03-11 NOTE — Telephone Encounter (Signed)
Pt wants to know if this has been taken care of yet.  Please provide an update.  Satira Anis

## 2014-03-11 NOTE — Telephone Encounter (Signed)
I called made pt aware of the below. She voiced her understanding

## 2014-03-11 NOTE — Telephone Encounter (Signed)
lmctb with dr Marzetta Board office i need dx code to use for referral Jenny Madden

## 2014-03-12 NOTE — Telephone Encounter (Signed)
Pt aware referral was placed for Rolanda Lundborg

## 2014-05-24 ENCOUNTER — Encounter: Payer: Self-pay | Admitting: Pulmonary Disease

## 2014-05-25 ENCOUNTER — Encounter: Payer: Self-pay | Admitting: Gastroenterology

## 2014-06-15 ENCOUNTER — Other Ambulatory Visit: Payer: Self-pay | Admitting: Gynecology

## 2014-06-15 DIAGNOSIS — Z1231 Encounter for screening mammogram for malignant neoplasm of breast: Secondary | ICD-10-CM

## 2014-07-08 ENCOUNTER — Ambulatory Visit: Payer: Commercial Managed Care - HMO | Admitting: Internal Medicine

## 2014-07-09 ENCOUNTER — Ambulatory Visit (INDEPENDENT_AMBULATORY_CARE_PROVIDER_SITE_OTHER): Payer: Medicare HMO | Admitting: Internal Medicine

## 2014-07-09 ENCOUNTER — Encounter: Payer: Self-pay | Admitting: Internal Medicine

## 2014-07-09 ENCOUNTER — Other Ambulatory Visit (INDEPENDENT_AMBULATORY_CARE_PROVIDER_SITE_OTHER): Payer: Medicare HMO

## 2014-07-09 VITALS — BP 142/82 | HR 104 | Temp 98.1°F | Resp 16 | Ht 64.0 in | Wt 172.1 lb

## 2014-07-09 DIAGNOSIS — E7800 Pure hypercholesterolemia, unspecified: Secondary | ICD-10-CM

## 2014-07-09 DIAGNOSIS — E119 Type 2 diabetes mellitus without complications: Secondary | ICD-10-CM

## 2014-07-09 DIAGNOSIS — I10 Essential (primary) hypertension: Secondary | ICD-10-CM

## 2014-07-09 DIAGNOSIS — J302 Other seasonal allergic rhinitis: Secondary | ICD-10-CM

## 2014-07-09 DIAGNOSIS — M8580 Other specified disorders of bone density and structure, unspecified site: Secondary | ICD-10-CM

## 2014-07-09 DIAGNOSIS — R010 Benign and innocent cardiac murmurs: Secondary | ICD-10-CM

## 2014-07-09 DIAGNOSIS — R011 Cardiac murmur, unspecified: Secondary | ICD-10-CM

## 2014-07-09 DIAGNOSIS — Z23 Encounter for immunization: Secondary | ICD-10-CM

## 2014-07-09 LAB — MICROALBUMIN / CREATININE URINE RATIO
Creatinine,U: 128.6 mg/dL
Microalb Creat Ratio: 0.9 mg/g (ref 0.0–30.0)
Microalb, Ur: 1.2 mg/dL (ref 0.0–1.9)

## 2014-07-09 LAB — HEMOGLOBIN A1C: Hgb A1c MFr Bld: 6.3 % (ref 4.6–6.5)

## 2014-07-09 NOTE — Assessment & Plan Note (Signed)
Patient not on ACE-I and will start at next visit. Check microalbumin:creatinine ratio today. Check HgA1c, Foot exam done. Last was well controlled.

## 2014-07-09 NOTE — Assessment & Plan Note (Signed)
BP well controlled. May need to add ACE-I for diabetes based on screening today.

## 2014-07-09 NOTE — Assessment & Plan Note (Signed)
Takes calcium and vitamin D. Will look into last DEXA.

## 2014-07-09 NOTE — Progress Notes (Signed)
   Subjective:    Patient ID: Jenny Madden, female    DOB: 06-10-1941, 73 y.o.   MRN: [id number]  HPI The patient is a 73 YO female who is coming in to establish care. She has PMH of vitamin D deficiency, GERD, allergies, DM, HTN, hyperlipidemia. She is having some congestion in her left ear and is worried she may have a bug in her ear. She denies chest pains, hypo/hyperglycemia. She checks her sugar once a day. She denies burning or numbness in her feet.   Review of Systems  Constitutional: Negative for fever, activity change, appetite change and fatigue.  HENT: Positive for ear pain.   Respiratory: Negative for cough, chest tightness, shortness of breath and wheezing.   Cardiovascular: Negative for chest pain, palpitations and leg swelling.  Gastrointestinal: Negative for abdominal pain, diarrhea, constipation and abdominal distention.  Musculoskeletal: Negative for arthralgias, gait problem and myalgias.  Neurological: Negative for dizziness, weakness, light-headedness and headaches.      Objective:   Physical Exam  Constitutional: She is oriented to person, place, and time. She appears well-developed and well-nourished.  HENT:  Head: Normocephalic and atraumatic.  Right Ear: External ear normal.  Left Ear: External ear normal.  Eyes: EOM are normal.  Neck: Normal range of motion.  Cardiovascular: Normal rate and regular rhythm.   Pulmonary/Chest: Effort normal and breath sounds normal.  Abdominal: Soft. Bowel sounds are normal.  Neurological: She is alert and oriented to person, place, and time. Coordination normal.  Skin: Skin is warm and dry.   Filed Vitals:   07/09/14 0843  BP: 142/82  Pulse: 104  Temp: 98.1 F (36.7 C)  TempSrc: Oral  Resp: 16  Height: 5\' 4"  (1.626 m)  Weight: 172 lb 1.9 oz (78.073 kg)  SpO2: 98%      Assessment & Plan:

## 2014-07-09 NOTE — Patient Instructions (Signed)
We will check your tests for diabetes including urine and blood. We will let you know about the results.   Come back in about 6 months so we can check up on your diabetes. Keep working on exercising about 4-5 times per week to keep healthy.

## 2014-07-09 NOTE — Assessment & Plan Note (Signed)
Echo 2014 without cause, some hyperdynamic motion of LV.

## 2014-07-09 NOTE — Assessment & Plan Note (Signed)
Allegra prn during allergy season for her.

## 2014-07-09 NOTE — Assessment & Plan Note (Signed)
Continue zocor. Recent lipid panel at goal.

## 2014-07-09 NOTE — Progress Notes (Signed)
Pre visit review using our clinic review tool, if applicable. No additional management support is needed unless otherwise documented below in the visit note. 

## 2014-07-12 ENCOUNTER — Other Ambulatory Visit: Payer: Self-pay | Admitting: Geriatric Medicine

## 2014-07-12 MED ORDER — METFORMIN HCL 500 MG PO TABS: ORAL_TABLET | ORAL | Status: DC

## 2014-07-12 MED ORDER — SIMVASTATIN 40 MG PO TABS: ORAL_TABLET | ORAL | Status: DC

## 2014-07-12 MED ORDER — AMLODIPINE BESYLATE 10 MG PO TABS
ORAL_TABLET | ORAL | Status: DC
Start: 2014-07-12 — End: 2014-11-25

## 2014-08-03 ENCOUNTER — Ambulatory Visit (HOSPITAL_COMMUNITY)
Admission: RE | Admit: 2014-08-03 | Discharge: 2014-08-03 | Disposition: A | Discharge status: Home / Self Care | Payer: Medicare HMO | Source: Ambulatory Visit | Attending: Gynecology | Admitting: Gynecology

## 2014-08-03 DIAGNOSIS — Z1231 Encounter for screening mammogram for malignant neoplasm of breast: Secondary | ICD-10-CM | POA: Diagnosis present

## 2014-08-09 ENCOUNTER — Encounter: Payer: Self-pay | Admitting: Internal Medicine

## 2014-08-14 ENCOUNTER — Other Ambulatory Visit: Payer: Self-pay | Admitting: Pulmonary Disease

## 2014-08-30 ENCOUNTER — Telehealth: Payer: Self-pay | Admitting: Internal Medicine

## 2014-08-30 NOTE — Telephone Encounter (Signed)
Pt called request accu chek test strip to be send into [facility] mail delivery. Please advise.

## 2014-08-31 ENCOUNTER — Other Ambulatory Visit: Payer: Self-pay | Admitting: Geriatric Medicine

## 2014-08-31 MED ORDER — GLUCOSE BLOOD VI STRP: ORAL_STRIP | Status: DC

## 2014-08-31 NOTE — Telephone Encounter (Signed)
Sent test strips to [facility]

## 2014-11-25 ENCOUNTER — Other Ambulatory Visit: Payer: Self-pay | Admitting: Geriatric Medicine

## 2014-11-25 ENCOUNTER — Telehealth: Payer: Self-pay | Admitting: Internal Medicine

## 2014-11-25 MED ORDER — AMLODIPINE BESYLATE 10 MG PO TABS: ORAL_TABLET | ORAL | Status: DC

## 2014-11-25 MED ORDER — METFORMIN HCL 500 MG PO TABS: ORAL_TABLET | ORAL | Status: DC

## 2014-11-25 MED ORDER — SIMVASTATIN 40 MG PO TABS: ORAL_TABLET | ORAL | Status: DC

## 2014-11-25 NOTE — Telephone Encounter (Signed)
Patient switched insurances and ios asking that these RX be called in to [facility] on [address]  metFORMIN (GLUCOPHAGE) 500 MG tablet [[id number]]  amLODipine (NORVASC) 10 MG tablet [[id number]]  simvastatin (ZOCOR) 40 MG tablet [[id number]]   She states [facility] is going to call us re: testing strips

## 2014-11-29 ENCOUNTER — Other Ambulatory Visit: Payer: Self-pay | Admitting: Geriatric Medicine

## 2014-11-29 MED ORDER — SIMVASTATIN 40 MG PO TABS: ORAL_TABLET | ORAL | Status: DC

## 2014-11-29 MED ORDER — AMLODIPINE BESYLATE 10 MG PO TABS: ORAL_TABLET | ORAL | Status: DC

## 2014-11-29 MED ORDER — METFORMIN HCL 500 MG PO TABS: ORAL_TABLET | ORAL | Status: DC

## 2014-11-29 NOTE — Telephone Encounter (Signed)
Sent to pharmacy 

## 2014-11-29 NOTE — Telephone Encounter (Signed)
Patient is requesting test strips and meter script to be sent to [facility].  [place] [phone]

## 2014-12-03 ENCOUNTER — Telehealth: Payer: Self-pay | Admitting: Internal Medicine

## 2014-12-03 NOTE — Telephone Encounter (Signed)
Jenny Madden with  [facility] needs verbal ok to ship diabetes  supplies   [phone] Ext [phone]

## 2014-12-06 ENCOUNTER — Telehealth: Payer: Self-pay | Admitting: Internal Medicine

## 2014-12-06 ENCOUNTER — Other Ambulatory Visit: Payer: Self-pay

## 2014-12-06 MED ORDER — BLOOD GLUCOSE MONITOR KIT: PACK | Status: DC

## 2014-12-06 MED ORDER — GLUCOSE BLOOD VI STRP: ORAL_STRIP | Status: DC

## 2014-12-06 MED ORDER — ACCU-CHEK SOFTCLIX LANCETS MISC: Status: DC

## 2014-12-06 NOTE — Telephone Encounter (Signed)
Called pt and rx has been reprinted for PCP signature.  Faxed to [phone]

## 2014-12-06 NOTE — Telephone Encounter (Signed)
Pt called back to check on this request, please call pt and and give call the diabetic supply person because she is out of her supply (this is going on for 2 weeks).

## 2014-12-16 NOTE — Telephone Encounter (Signed)
Patient states that her diabetic supplies to [place] needs to have a physicians Order sent.  Fax number is [phone].

## 2014-12-23 ENCOUNTER — Other Ambulatory Visit: Payer: Self-pay | Admitting: Geriatric Medicine

## 2014-12-23 MED ORDER — GLUCOSE BLOOD VI STRP: ORAL_STRIP | Status: DC

## 2014-12-23 MED ORDER — ACCU-CHEK SOFTCLIX LANCETS MISC: Status: DC

## 2014-12-23 NOTE — Telephone Encounter (Signed)
Faxed supplies to pharmacy.

## 2015-01-12 ENCOUNTER — Encounter: Payer: Self-pay | Admitting: Internal Medicine

## 2015-01-12 ENCOUNTER — Other Ambulatory Visit (INDEPENDENT_AMBULATORY_CARE_PROVIDER_SITE_OTHER): Payer: Medicare HMO

## 2015-01-12 ENCOUNTER — Ambulatory Visit (INDEPENDENT_AMBULATORY_CARE_PROVIDER_SITE_OTHER): Payer: Medicare HMO | Admitting: Internal Medicine

## 2015-01-12 VITALS — BP 140/72 | HR 92 | Temp 98.7°F | Resp 14 | Wt 173.8 lb

## 2015-01-12 DIAGNOSIS — Z299 Encounter for prophylactic measures, unspecified: Secondary | ICD-10-CM

## 2015-01-12 DIAGNOSIS — E119 Type 2 diabetes mellitus without complications: Secondary | ICD-10-CM | POA: Diagnosis not present

## 2015-01-12 DIAGNOSIS — Z418 Encounter for other procedures for purposes other than remedying health state: Secondary | ICD-10-CM

## 2015-01-12 DIAGNOSIS — I10 Essential (primary) hypertension: Secondary | ICD-10-CM | POA: Diagnosis not present

## 2015-01-12 DIAGNOSIS — M25473 Effusion, unspecified ankle: Secondary | ICD-10-CM | POA: Diagnosis not present

## 2015-01-12 DIAGNOSIS — Z23 Encounter for immunization: Secondary | ICD-10-CM

## 2015-01-12 LAB — HEMOGLOBIN A1C: Hgb A1c MFr Bld: 6.1 % (ref 4.6–6.5)

## 2015-01-12 NOTE — Progress Notes (Signed)
Pre visit review using our clinic review tool, if applicable. No additional management support is needed unless otherwise documented below in the visit note. 

## 2015-01-12 NOTE — Patient Instructions (Signed)
We have given you the pneumonia booster shot today. We will check on the diabetes today and call you with the results.   We will see you back for your physical and can do the rest of the lab work then.  Diabetes and Exercise Exercising regularly is important. It is not just about losing weight. It has many health benefits, such as:  Improving your overall fitness, flexibility, and endurance.  Increasing your bone density.  Helping with weight control.  Decreasing your body fat.  Increasing your muscle strength.  Reducing stress and tension.  Improving your overall health. People with diabetes who exercise gain additional benefits because exercise:  Reduces appetite.  Improves the body's use of blood sugar (glucose).  Helps lower or control blood glucose.  Decreases blood pressure.  Helps control blood lipids (such as cholesterol and triglycerides).  Improves the body's use of the hormone insulin by:  Increasing the body's insulin sensitivity.  Reducing the body's insulin needs.  Decreases the risk for heart disease because exercising:  Lowers cholesterol and triglycerides levels.  Increases the levels of good cholesterol (such as high-density lipoproteins [HDL]) in the body.  Lowers blood glucose levels. YOUR ACTIVITY PLAN  Choose an activity that you enjoy and set realistic goals. Your health care provider or diabetes educator can help you make an activity plan that works for you. Exercise regularly as directed by your health care provider. This includes:  Performing resistance training twice a week such as push-ups, sit-ups, lifting weights, or using resistance bands.  Performing 150 minutes of cardio exercises each week such as walking, running, or playing sports.  Staying active and spending no more than 90 minutes at one time being inactive. Even short bursts of exercise are good for you. Three 10-minute sessions spread throughout the day are just as  beneficial as a single 30-minute session. Some exercise ideas include:  Taking the dog for a walk.  Taking the stairs instead of the elevator.  Dancing to your favorite song.  Doing an exercise video.  Doing your favorite exercise with a friend. RECOMMENDATIONS FOR EXERCISING WITH TYPE 1 OR TYPE 2 DIABETES   Check your blood glucose before exercising. If blood glucose levels are greater than 240 mg/dL, check for urine ketones. Do not exercise if ketones are present.  Avoid injecting insulin into areas of the body that are going to be exercised. For example, avoid injecting insulin into:  The arms when playing tennis.  The legs when jogging.  Keep a record of:  Food intake before and after you exercise.  Expected peak times of insulin action.  Blood glucose levels before and after you exercise.  The type and amount of exercise you have done.  Review your records with your health care provider. Your health care provider will help you to develop guidelines for adjusting food intake and insulin amounts before and after exercising.  If you take insulin or oral hypoglycemic agents, watch for signs and symptoms of hypoglycemia. They include:  Dizziness.  Shaking.  Sweating.  Chills.  Confusion.  Drink plenty of water while you exercise to prevent dehydration or heat stroke. Body water is lost during exercise and must be replaced.  Talk to your health care provider before starting an exercise program to make sure it is safe for you. Remember, almost any type of activity is better than none. Document Released: 12/15/2003 Document Revised: 02/08/2014 Document Reviewed: 03/03/2013 [facility] Patient Information 2015 [place], [place]. This information is not intended to replace  advice given to you by your health care provider. Make sure you discuss any questions you have with your health care provider.  

## 2015-01-13 DIAGNOSIS — M25473 Effusion, unspecified ankle: Secondary | ICD-10-CM | POA: Insufficient documentation

## 2015-01-13 NOTE — Assessment & Plan Note (Signed)
Talked with her about switching amlodipine due to ankle swelling but she does not wish to try that. Will continue amlodipine 10 mg daily. BP borderline but controlled today. Home readings even better.

## 2015-01-13 NOTE — Assessment & Plan Note (Signed)
Bilateral and equal, not pronounced on exam today. Kidneys and liver normal recently so will not recheck today. Talked about the fact that amlodipine can be causing some leg swelling but she did not wish to switch. She will try stocking and elevating her legs. If not controlled with that can try compression stockings.

## 2015-01-13 NOTE — Progress Notes (Signed)
   Subjective:    Patient ID: Jenny Madden, female    DOB: Sep 10, 1941, 74 y.o.   MRN: [id number]  HPI The patient is a 74 YO female who is coming in to follow up on her blood pressure as well as a new problem of ankle swelling. She is doing well with the blood pressure and still taking amlodipine (has been on for years) without complications. Home readings have been good and she is trying to exercise. Her ankles have been swelling with prolonged standing for some time but lately have been swelling anytime (including in the morning) for the last 2-3 months. She has not changed her sodium intake or weight recently. No change in medications. No numbness or tingling in her feet and no pain. Both swell equally and stays in the ankles.   Review of Systems  Constitutional: Negative for fever, activity change, appetite change and fatigue.  Respiratory: Negative for cough, chest tightness, shortness of breath and wheezing.   Cardiovascular: Positive for leg swelling. Negative for chest pain and palpitations.  Gastrointestinal: Negative for abdominal pain, diarrhea, constipation and abdominal distention.  Musculoskeletal: Negative for myalgias, arthralgias and gait problem.  Neurological: Negative for dizziness, weakness, light-headedness and headaches.      Objective:   Physical Exam  Constitutional: She is oriented to person, place, and time. She appears well-developed and well-nourished.  HENT:  Head: Normocephalic and atraumatic.  Right Ear: External ear normal.  Left Ear: External ear normal.  Eyes: EOM are normal.  Neck: Normal range of motion.  Cardiovascular: Normal rate and regular rhythm.   Pulmonary/Chest: Effort normal and breath sounds normal.  Abdominal: Soft. Bowel sounds are normal.  Musculoskeletal:  Minimal swelling in the ankles possibly 1+, trace bilateral. No skin color changes or prominent varicose veins.   Neurological: She is alert and oriented to person, place, and  time. Coordination normal.  Skin: Skin is warm and dry.   Filed Vitals:   01/12/15 0823  BP: 140/72  Pulse: 92  Temp: 98.7 F (37.1 C)  TempSrc: Oral  Resp: 14  Weight: 173 lb 12.8 oz (78.835 kg)  SpO2: 97%      Assessment & Plan:

## 2015-03-16 LAB — HM DIABETES EYE EXAM

## 2015-03-28 ENCOUNTER — Telehealth: Payer: Self-pay | Admitting: Internal Medicine

## 2015-03-28 NOTE — Telephone Encounter (Signed)
Rec'd from [facility] forward 5 pages to [place]

## 2015-04-07 LAB — HM DIABETES EYE EXAM

## 2015-04-08 ENCOUNTER — Telehealth: Payer: Self-pay | Admitting: Internal Medicine

## 2015-04-08 NOTE — Telephone Encounter (Signed)
Received records from [facility] forwarded to Dr. Doug Sou 04/08/15 fbg.

## 2015-04-08 NOTE — Telephone Encounter (Signed)
Rec'd from [facility] forward 1 page to Dr. Doug Sou

## 2015-05-03 ENCOUNTER — Encounter: Payer: Self-pay | Admitting: Internal Medicine

## 2015-05-03 ENCOUNTER — Ambulatory Visit (INDEPENDENT_AMBULATORY_CARE_PROVIDER_SITE_OTHER): Payer: Medicare HMO | Admitting: Internal Medicine

## 2015-05-03 VITALS — BP 138/68 | HR 88 | Temp 98.6°F | Resp 16 | Ht 64.0 in | Wt 177.0 lb

## 2015-05-03 DIAGNOSIS — J302 Other seasonal allergic rhinitis: Secondary | ICD-10-CM

## 2015-05-03 MED ORDER — FLUTICASONE PROPIONATE 50 MCG/ACT NA SUSP: 2.0000 | Freq: Every day | NASAL | Status: DC

## 2015-05-03 NOTE — Assessment & Plan Note (Signed)
Can continue allegra or flonase (rx sent in today). No indication for antibiotics at this time.

## 2015-05-03 NOTE — Progress Notes (Signed)
Pre visit review using our clinic review tool, if applicable. No additional management support is needed unless otherwise documented below in the visit note. 

## 2015-05-03 NOTE — Patient Instructions (Signed)
We have sent in the refill of the flonase which you can use daily for the next 1-2 weeks. You can also try drinking warm liquids (such as hot tea or coffee) with a little honey in it to help coat and protect the throat to make it feel better.   Allergic Rhinitis Allergic rhinitis is when the mucous membranes in the nose respond to allergens. Allergens are particles in the air that cause your body to have an allergic reaction. This causes you to release allergic antibodies. Through a chain of events, these eventually cause you to release histamine into the blood stream. Although meant to protect the body, it is this release of histamine that causes your discomfort, such as frequent sneezing, congestion, and an itchy, runny nose.  CAUSES  Seasonal allergic rhinitis (hay fever) is caused by pollen allergens that may come from grasses, trees, and weeds. Year-round allergic rhinitis (perennial allergic rhinitis) is caused by allergens such as house dust mites, pet dander, and mold spores.  SYMPTOMS   Nasal stuffiness (congestion).  Itchy, runny nose with sneezing and tearing of the eyes. DIAGNOSIS  Your health care provider can help you determine the allergen or allergens that trigger your symptoms. If you and your health care provider are unable to determine the allergen, skin or blood testing may be used. TREATMENT  Allergic rhinitis does not have a cure, but it can be controlled by:  Medicines and allergy shots (immunotherapy).  Avoiding the allergen. Hay fever may often be treated with antihistamines in pill or nasal spray forms. Antihistamines block the effects of histamine. There are over-the-counter medicines that may help with nasal congestion and swelling around the eyes. Check with your health care provider before taking or giving this medicine.  If avoiding the allergen or the medicine prescribed do not work, there are many new medicines your health care provider can prescribe. Stronger  medicine may be used if initial measures are ineffective. Desensitizing injections can be used if medicine and avoidance does not work. Desensitization is when a patient is given ongoing shots until the body becomes less sensitive to the allergen. Make sure you follow up with your health care provider if problems continue. HOME CARE INSTRUCTIONS It is not possible to completely avoid allergens, but you can reduce your symptoms by taking steps to limit your exposure to them. It helps to know exactly what you are allergic to so that you can avoid your specific triggers. SEEK MEDICAL CARE IF:   You have a fever.  You develop a cough that does not stop easily (persistent).  You have shortness of breath.  You start wheezing.  Symptoms interfere with normal daily activities. Document Released: 06/19/2001 Document Revised: 09/29/2013 Document Reviewed: 06/01/2013 [facility] Patient Information 2015 [place], [place]. This information is not intended to replace advice given to you by your health care provider. Make sure you discuss any questions you have with your health care provider.

## 2015-05-03 NOTE — Progress Notes (Signed)
   Subjective:    Patient ID: Jenny Madden, female    DOB: 05/20/1941, 74 y.o.   MRN: [id number]  HPI The patient is a 74 YO female coming in for sore throat and ear blockage for 2 days. She started with it on Sunday and it has been the same since then. She has not tried anything for it. Denies fevers or chills. No SOB or cough. Some nasal congestion as well. No facial tenderness or headaches.   Review of Systems  Constitutional: Negative for fever, chills, activity change, appetite change, fatigue and unexpected weight change.  HENT: Positive for congestion, postnasal drip and sore throat. Negative for ear discharge, ear pain, facial swelling, nosebleeds, rhinorrhea, sinus pressure, trouble swallowing and voice change.   Respiratory: Negative.   Cardiovascular: Negative.       Objective:   Physical Exam  Constitutional: She appears well-developed and well-nourished.  HENT:  Head: Normocephalic and atraumatic.  Right Ear: External ear normal.  Left Ear: External ear normal.  Oropharynx with mild erythema, nasal turbinates with mild redness and swelling.  Eyes: EOM are normal.  Neck: No JVD present.  Cardiovascular: Normal rate and regular rhythm.   Pulmonary/Chest: Effort normal and breath sounds normal. No respiratory distress. She has no wheezes.  Abdominal: Soft.  Lymphadenopathy:    She has no cervical adenopathy.   Filed Vitals:   05/03/15 1301  BP: 138/68  Pulse: 88  Temp: 98.6 F (37 C)  TempSrc: Oral  Resp: 16  Height: 5\' 4"  (1.626 m)  Weight: 177 lb (80.287 kg)  SpO2: 98%      Assessment & Plan:

## 2015-05-11 ENCOUNTER — Ambulatory Visit (INDEPENDENT_AMBULATORY_CARE_PROVIDER_SITE_OTHER): Payer: Medicare HMO

## 2015-05-11 ENCOUNTER — Encounter: Payer: Self-pay | Admitting: Podiatry

## 2015-05-11 ENCOUNTER — Ambulatory Visit (INDEPENDENT_AMBULATORY_CARE_PROVIDER_SITE_OTHER): Payer: Medicare HMO | Admitting: Podiatry

## 2015-05-11 VITALS — BP 138/74 | HR 84 | Resp 15

## 2015-05-11 DIAGNOSIS — M779 Enthesopathy, unspecified: Secondary | ICD-10-CM

## 2015-05-11 DIAGNOSIS — M79672 Pain in left foot: Secondary | ICD-10-CM | POA: Diagnosis not present

## 2015-05-11 DIAGNOSIS — L309 Dermatitis, unspecified: Secondary | ICD-10-CM | POA: Diagnosis not present

## 2015-05-11 DIAGNOSIS — M79671 Pain in right foot: Secondary | ICD-10-CM

## 2015-05-11 MED ORDER — TRIAMCINOLONE ACETONIDE 10 MG/ML IJ SUSP
10.0000 mg | Freq: Once | INTRAMUSCULAR | Status: AC
  Administered 2015-05-11: 10 mg

## 2015-05-11 NOTE — Progress Notes (Signed)
   Subjective:    Patient ID: Jenny Madden, female    DOB: 12-05-1940, 74 y.o.   MRN: [id number]  HPI Pt presents with right foot soreness on lateral side, states she noticed it about on month ago. Also has b/l swelling of feet but she isnt concerned with swelling, states that her doctor told her it was from her BP medication   Review of Systems  Cardiovascular: Positive for leg swelling.  All other systems reviewed and are negative.      Objective:   Physical Exam        Assessment & Plan:

## 2015-05-11 NOTE — Progress Notes (Signed)
Subjective:     Patient ID: Jenny Madden, female   DOB: 1941/05/31, 74 y.o.   MRN: [id number]  HPI patient presents stating I'm having a lot of pain in the outside of my right foot and I do get swelling overall. States that it has been bad for the last several months   Review of Systems  All other systems reviewed and are negative.      Objective:   Physical Exam  Constitutional: She is oriented to person, place, and time.  Cardiovascular: Intact distal pulses.   Musculoskeletal: Normal range of motion.  Neurological: She is oriented to person, place, and time.  Skin: Skin is warm.  Nursing note and vitals reviewed.  neurovascular status found to be intact with reduction of motion subtalar joint bilateral and muscle strength is adequate. Patient is found to have quite a bit of discomfort in the lateral side of the right foot at the peroneal insertion base of fifth metatarsal with inflammation and fluid buildup noted. Patient has good digital perfusion and is well oriented 3     Assessment:     Tendinitis right fifth MPJ base with peroneal involvement right    Plan:     H&P and x-rays reviewed with patient. Oriented to focus on this right foot and today I reviewed both x-rays and went ahead and injected the peroneal insertion right 3 mg Kenalog 5 mg Xylocaine and applied fascial brace with instructions on usage. Patient will utilize elevation and ice and will be seen back if symptoms persist

## 2015-06-06 ENCOUNTER — Other Ambulatory Visit: Payer: Self-pay

## 2015-06-06 DIAGNOSIS — Z1231 Encounter for screening mammogram for malignant neoplasm of breast: Secondary | ICD-10-CM

## 2015-07-13 ENCOUNTER — Ambulatory Visit (INDEPENDENT_AMBULATORY_CARE_PROVIDER_SITE_OTHER): Payer: Medicare HMO | Admitting: Internal Medicine

## 2015-07-13 ENCOUNTER — Other Ambulatory Visit (INDEPENDENT_AMBULATORY_CARE_PROVIDER_SITE_OTHER): Payer: Medicare HMO

## 2015-07-13 ENCOUNTER — Encounter: Payer: Self-pay | Admitting: Internal Medicine

## 2015-07-13 VITALS — BP 130/78 | HR 90 | Temp 98.5°F | Resp 14 | Ht 64.0 in | Wt 175.1 lb

## 2015-07-13 DIAGNOSIS — E119 Type 2 diabetes mellitus without complications: Secondary | ICD-10-CM

## 2015-07-13 DIAGNOSIS — Z0000 Encounter for general adult medical examination without abnormal findings: Secondary | ICD-10-CM

## 2015-07-13 DIAGNOSIS — E785 Hyperlipidemia, unspecified: Secondary | ICD-10-CM | POA: Diagnosis not present

## 2015-07-13 DIAGNOSIS — I10 Essential (primary) hypertension: Secondary | ICD-10-CM

## 2015-07-13 DIAGNOSIS — Z23 Encounter for immunization: Secondary | ICD-10-CM | POA: Diagnosis not present

## 2015-07-13 LAB — LIPID PANEL
CHOL/HDL RATIO: 3
Cholesterol: 149 mg/dL (ref 0–200)
HDL: 50.9 mg/dL (ref >39.00)
LDL CALC: 83 mg/dL (ref 0–99)
NONHDL: 97.62
TRIGLYCERIDES: 72 mg/dL (ref 0.0–149.0)
VLDL: 14.4 mg/dL (ref 0.0–40.0)

## 2015-07-13 LAB — COMPREHENSIVE METABOLIC PANEL
ALT: 13 U/L (ref 0–35)
AST: 17 U/L (ref 0–37)
Albumin: 4.3 g/dL (ref 3.5–5.2)
Alkaline Phosphatase: 63 U/L (ref 39–117)
BUN: 7 mg/dL (ref 6–23)
CALCIUM: 10.1 mg/dL (ref 8.4–10.5)
CHLORIDE: 105 meq/L (ref 96–112)
CO2: 28 meq/L (ref 19–32)
CREATININE: 0.88 mg/dL (ref 0.40–1.20)
GFR: 80.58 mL/min (ref >60.00)
GLUCOSE: 111 mg/dL — AB (ref 70–99)
Potassium: 4 mEq/L (ref 3.5–5.1)
SODIUM: 142 meq/L (ref 135–145)
Total Bilirubin: 0.3 mg/dL (ref 0.2–1.2)
Total Protein: 7.7 g/dL (ref 6.0–8.3)

## 2015-07-13 LAB — MICROALBUMIN / CREATININE URINE RATIO
CREATININE, U: 201.2 mg/dL
MICROALB UR: 1.2 mg/dL (ref 0.0–1.9)
Microalb Creat Ratio: 0.6 mg/g (ref 0.0–30.0)

## 2015-07-13 LAB — HEMOGLOBIN A1C: HEMOGLOBIN A1C: 6.1 % (ref 4.6–6.5)

## 2015-07-13 NOTE — Patient Instructions (Signed)
We have looked at the EKG and it was normal.   For the stomach we recommend that you try miralax one scoop a day to see if this helps the bowels to move better.   We are checking the blood and urine today and will call you back with the results.   Health Maintenance, Female Adopting a healthy lifestyle and getting preventive care can go a long way to promote health and wellness. Talk with your health care provider about what schedule of regular examinations is right for you. This is a good chance for you to check in with your provider about disease prevention and staying healthy. In between checkups, there are plenty of things you can do on your own. Experts have done a lot of research about which lifestyle changes and preventive measures are most likely to keep you healthy. Ask your health care provider for more information. WEIGHT AND DIET  Eat a healthy diet  Be sure to include plenty of vegetables, fruits, low-fat dairy products, and lean protein.  Do not eat a lot of foods high in solid fats, added sugars, or salt.  Get regular exercise. This is one of the most important things you can do for your health.  Most adults should exercise for at least 150 minutes each week. The exercise should increase your heart rate and make you sweat (moderate-intensity exercise).  Most adults should also do strengthening exercises at least twice a week. This is in addition to the moderate-intensity exercise.  Maintain a healthy weight  Body mass index (BMI) is a measurement that can be used to identify possible weight problems. It estimates body fat based on height and weight. Your health care provider can help determine your BMI and help you achieve or maintain a healthy weight.  For females 96 years of age and older:   A BMI below 18.5 is considered underweight.  A BMI of 18.5 to 24.9 is normal.  A BMI of 25 to 29.9 is considered overweight.  A BMI of 30 and above is considered obese.   Watch levels of cholesterol and blood lipids  You should start having your blood tested for lipids and cholesterol at 75 years of age, then have this test every 5 years.  You may need to have your cholesterol levels checked more often if:  Your lipid or cholesterol levels are high.  You are older than 75 years of age.  You are at high risk for heart disease.  CANCER SCREENING   Lung Cancer  Lung cancer screening is recommended for adults 55-75 years old who are at high risk for lung cancer because of a history of smoking.  A yearly low-dose CT scan of the lungs is recommended for people who:  Currently smoke.  Have quit within the past 15 years.  Have at least a 30-pack-year history of smoking. A pack year is smoking an average of one pack of cigarettes a day for 1 year.  Yearly screening should continue until it has been 15 years since you quit.  Yearly screening should stop if you develop a health problem that would prevent you from having lung cancer treatment.  Breast Cancer  Practice breast self-awareness. This means understanding how your breasts normally appear and feel.  It also means doing regular breast self-exams. Let your health care provider know about any changes, no matter how small.  If you are in your 75s or 30s, you should have a clinical breast exam (CBE) by a health  care provider every 1-3 years as part of a regular health exam.  If you are 75 or older, have a CBE every year. Also consider having a breast X-ray (mammogram) every year.  If you have a family history of breast cancer, talk to your health care provider about genetic screening.  If you are at high risk for breast cancer, talk to your health care provider about having an MRI and a mammogram every year.  Breast cancer gene (BRCA) assessment is recommended for women who have family members with BRCA-related cancers. BRCA-related cancers  include:  Breast.  Ovarian.  Tubal.  Peritoneal cancers.  Results of the assessment will determine the need for genetic counseling and BRCA1 and BRCA2 testing. Cervical Cancer Your health care provider may recommend that you be screened regularly for cancer of the pelvic organs (ovaries, uterus, and vagina). This screening involves a pelvic examination, including checking for microscopic changes to the surface of your cervix (Pap test). You may be encouraged to have this screening done every 3 years, beginning at age 21.  For women ages 75-65, health care providers may recommend pelvic exams and Pap testing every 3 years, or they may recommend the Pap and pelvic exam, combined with testing for human papilloma virus (HPV), every 5 years. Some types of HPV increase your risk of cervical cancer. Testing for HPV may also be done on women of any age with unclear Pap test results.  Other health care providers may not recommend any screening for nonpregnant women who are considered low risk for pelvic cancer and who do not have symptoms. Ask your health care provider if a screening pelvic exam is right for you.  If you have had past treatment for cervical cancer or a condition that could lead to cancer, you need Pap tests and screening for cancer for at least 20 years after your treatment. If Pap tests have been discontinued, your risk factors (such as having a new sexual partner) need to be reassessed to determine if screening should resume. Some women have medical problems that increase the chance of getting cervical cancer. In these cases, your health care provider may recommend more frequent screening and Pap tests. Colorectal Cancer  This type of cancer can be detected and often prevented.  Routine colorectal cancer screening usually begins at 75 years of age and continues through 75 years of age.  Your health care provider may recommend screening at an earlier age if you have risk factors for  colon cancer.  Your health care provider may also recommend using home test kits to check for hidden blood in the stool.  A small camera at the end of a tube can be used to examine your colon directly (sigmoidoscopy or colonoscopy). This is done to check for the earliest forms of colorectal cancer.  Routine screening usually begins at age 36.  Direct examination of the colon should be repeated every 5-10 years through 75 years of age. However, you may need to be screened more often if early forms of precancerous polyps or small growths are found. Skin Cancer  Check your skin from head to toe regularly.  Tell your health care provider about any new moles or changes in moles, especially if there is a change in a mole's shape or color.  Also tell your health care provider if you have a mole that is larger than the size of a pencil eraser.  Always use sunscreen. Apply sunscreen liberally and repeatedly throughout the day.  Protect  yourself by wearing long sleeves, pants, a wide-brimmed hat, and sunglasses whenever you are outside. HEART DISEASE, DIABETES, AND HIGH BLOOD PRESSURE   High blood pressure causes heart disease and increases the risk of stroke. High blood pressure is more likely to develop in:  People who have blood pressure in the high end of the normal range (130-139/85-89 mm Hg).  People who are overweight or obese.  People who are African American.  If you are 54-65 years of age, have your blood pressure checked every 3-5 years. If you are 81 years of age or older, have your blood pressure checked every year. You should have your blood pressure measured twice--once when you are at a hospital or clinic, and once when you are not at a hospital or clinic. Record the average of the two measurements. To check your blood pressure when you are not at a hospital or clinic, you can use:  An automated blood pressure machine at a pharmacy.  A home blood pressure monitor.  If you  are between 81 years and 18 years old, ask your health care provider if you should take aspirin to prevent strokes.  Have regular diabetes screenings. This involves taking a blood sample to check your fasting blood sugar level.  If you are at a normal weight and have a low risk for diabetes, have this test once every three years after 75 years of age.  If you are overweight and have a high risk for diabetes, consider being tested at a younger age or more often. PREVENTING INFECTION  Hepatitis B  If you have a higher risk for hepatitis B, you should be screened for this virus. You are considered at high risk for hepatitis B if:  You were born in a country where hepatitis B is common. Ask your health care provider which countries are considered high risk.  Your parents were born in a high-risk country, and you have not been immunized against hepatitis B (hepatitis B vaccine).  You have HIV or AIDS.  You use needles to inject street drugs.  You live with someone who has hepatitis B.  You have had sex with someone who has hepatitis B.  You get hemodialysis treatment.  You take certain medicines for conditions, including cancer, organ transplantation, and autoimmune conditions. Hepatitis C  Blood testing is recommended for:  Everyone born from [birth year] through 1966.  Anyone with known risk factors for hepatitis C. Sexually transmitted infections (STIs)  You should be screened for sexually transmitted infections (STIs) including gonorrhea and chlamydia if:  You are sexually active and are younger than 75 years of age.  You are older than 75 years of age and your health care provider tells you that you are at risk for this type of infection.  Your sexual activity has changed since you were last screened and you are at an increased risk for chlamydia or gonorrhea. Ask your health care provider if you are at risk.  If you do not have HIV, but are at risk, it may be recommended that you  take a prescription medicine daily to prevent HIV infection. This is called pre-exposure prophylaxis (PrEP). You are considered at risk if:  You are sexually active and do not regularly use condoms or know the HIV status of your partner(s).  You take drugs by injection.  You are sexually active with a partner who has HIV. Talk with your health care provider about whether you are at high risk of being infected with  HIV. If you choose to begin PrEP, you should first be tested for HIV. You should then be tested every 3 months for as long as you are taking PrEP.  PREGNANCY   If you are premenopausal and you may become pregnant, ask your health care provider about preconception counseling.  If you may become pregnant, take 400 to 800 micrograms (mcg) of folic acid every day.  If you want to prevent pregnancy, talk to your health care provider about birth control (contraception). OSTEOPOROSIS AND MENOPAUSE   Osteoporosis is a disease in which the bones lose minerals and strength with aging. This can result in serious bone fractures. Your risk for osteoporosis can be identified using a bone density scan.  If you are 53 years of age or older, or if you are at risk for osteoporosis and fractures, ask your health care provider if you should be screened.  Ask your health care provider whether you should take a calcium or vitamin D supplement to lower your risk for osteoporosis.  Menopause may have certain physical symptoms and risks.  Hormone replacement therapy may reduce some of these symptoms and risks. Talk to your health care provider about whether hormone replacement therapy is right for you.  HOME CARE INSTRUCTIONS   Schedule regular health, dental, and eye exams.  Stay current with your immunizations.   Do not use any tobacco products including cigarettes, chewing tobacco, or electronic cigarettes.  If you are pregnant, do not drink alcohol.  If you are breastfeeding, limit how  much and how often you drink alcohol.  Limit alcohol intake to no more than 1 drink per day for nonpregnant women. One drink equals 12 ounces of beer, 5 ounces of wine, or 1 ounces of hard liquor.  Do not use street drugs.  Do not share needles.  Ask your health care provider for help if you need support or information about quitting drugs.  Tell your health care provider if you often feel depressed.  Tell your health care provider if you have ever been abused or do not feel safe at home.   This information is not intended to replace advice given to you by your health care provider. Make sure you discuss any questions you have with your health care provider.   Document Released: 04/09/2011 Document Revised: 10/15/2014 Document Reviewed: 08/26/2013 Elsevier Interactive Patient Education [employer].

## 2015-07-13 NOTE — Assessment & Plan Note (Signed)
BP at goal on norvasc and she does not want to change today. Checking CMP and EKG.

## 2015-07-13 NOTE — Progress Notes (Signed)
Pre visit review using our clinic review tool, if applicable. No additional management support is needed unless otherwise documented below in the visit note. 

## 2015-07-13 NOTE — Assessment & Plan Note (Signed)
If Hga1c still low will decrease metformin to daily only. Checking microalbumin to creatinine ratio today as well as HgA1c. Foot exam done.

## 2015-07-13 NOTE — Assessment & Plan Note (Signed)
EKG performed today to look for changes, flu shot given at visit. Checking labs. Up to date on colonoscopy and other immunizations except shingles up to date. Counseled on advanced directives and the importance of both talking to family and having expressed in writing. She was also counseled about good bowel health.

## 2015-07-13 NOTE — Progress Notes (Signed)
   Subjective:    Patient ID: Jenny Madden, female    DOB: [birth year], 74 y.o.   MRN: [id number]  HPI Here for medicare wellness, no new complaints. Please see A/P for status and treatment of chronic medical problems.   Diet: DM since diabetic Physical activity: sedentary Depression/mood screen: negative Hearing: intact to whispered voice Visual acuity: grossly normal, performs annual eye exam  ADLs: capable Fall risk: none Home safety: good Cognitive evaluation: intact to orientation, naming, recall and repetition EOL planning: adv directives discussed and she does not have in place, has talked to her family about it.  I have personally reviewed and have noted 1. The patient's medical and social history - reviewed today no changes 2. Their use of alcohol, tobacco or illicit drugs 3. Their current medications and supplements 4. The patient's functional ability including ADL's, fall risks, home safety risks and hearing or visual impairment. 5. Diet and physical activities 6. Evidence for depression or mood disorders 7. Care team reviewed and updated (available in snapshot)  Review of Systems  Constitutional: Negative for fever, activity change, appetite change and fatigue.  HENT: Negative.   Eyes: Negative.   Respiratory: Negative for cough, chest tightness, shortness of breath and wheezing.   Cardiovascular: Negative for chest pain, palpitations and leg swelling.  Gastrointestinal: Negative for abdominal pain, diarrhea, constipation and abdominal distention.  Musculoskeletal: Negative for myalgias, arthralgias and gait problem.  Skin: Negative.   Neurological: Negative for dizziness, weakness, light-headedness and headaches.  Psychiatric/Behavioral: Negative.       Objective:   Physical Exam  Constitutional: She is oriented to person, place, and time. She appears well-developed and well-nourished.  HENT:  Head: Normocephalic and atraumatic.  Right Ear: External ear  normal.  Left Ear: External ear normal.  Eyes: EOM are normal.  Neck: Normal range of motion.  Cardiovascular: Normal rate and regular rhythm.   Pulmonary/Chest: Effort normal and breath sounds normal.  Abdominal: Soft. Bowel sounds are normal.  Musculoskeletal:  Minimal swelling in the ankles possibly 1+, trace bilateral. No skin color changes or prominent varicose veins.   Neurological: She is alert and oriented to person, place, and time. Coordination normal.  Skin: Skin is warm and dry.  See foot exam    Filed Vitals:   07/13/15 0803  BP: 130/78  Pulse: 90  Temp: 98.5 F (36.9 C)  TempSrc: Oral  Resp: 14  Height: 5\' 4"  (1.626 m)  Weight: 175 lb 1.9 oz (79.434 kg)  SpO2: 98%   EKG: rate 74, axis fairly normal, V2 with t wave abnormality (comparison to 2011 present and stable), intervals normal and no changes consistent with ischemia or LVH.     Assessment & Plan:  Flu shot given today.

## 2015-07-13 NOTE — Assessment & Plan Note (Signed)
Checking lipid panel today, on simvastatin 40 mg daily without side effects.

## 2015-08-05 ENCOUNTER — Ambulatory Visit
Admission: RE | Admit: 2015-08-05 | Discharge: 2015-08-05 | Disposition: A | Discharge status: Home / Self Care | Payer: Medicare HMO | Source: Ambulatory Visit

## 2015-08-05 DIAGNOSIS — Z1231 Encounter for screening mammogram for malignant neoplasm of breast: Secondary | ICD-10-CM

## 2015-08-22 ENCOUNTER — Encounter: Payer: Self-pay | Admitting: Gastroenterology

## 2015-08-23 ENCOUNTER — Telehealth: Payer: Self-pay | Admitting: *Deleted

## 2015-08-23 NOTE — Telephone Encounter (Signed)
Receive call stating pt is getting her diabetic supplies through company needing to get ICD10 code. Jenny Madden E11.9.../lmb

## 2015-08-30 ENCOUNTER — Ambulatory Visit: Payer: Medicare HMO | Admitting: Gastroenterology

## 2015-10-01 ENCOUNTER — Emergency Department (HOSPITAL_COMMUNITY)
Admission: EM | Admit: 2015-10-01 | Discharge: 2015-10-01 | Disposition: A | Discharge status: Home / Self Care | Payer: Medicare HMO | Attending: Emergency Medicine | Admitting: Emergency Medicine

## 2015-10-01 ENCOUNTER — Encounter (HOSPITAL_COMMUNITY): Payer: Self-pay | Admitting: Emergency Medicine

## 2015-10-01 ENCOUNTER — Emergency Department (HOSPITAL_COMMUNITY): Payer: Medicare HMO

## 2015-10-01 DIAGNOSIS — Z86018 Personal history of other benign neoplasm: Secondary | ICD-10-CM | POA: Diagnosis not present

## 2015-10-01 DIAGNOSIS — M1990 Unspecified osteoarthritis, unspecified site: Secondary | ICD-10-CM | POA: Diagnosis not present

## 2015-10-01 DIAGNOSIS — R1031 Right lower quadrant pain: Secondary | ICD-10-CM | POA: Diagnosis present

## 2015-10-01 DIAGNOSIS — E7800 Pure hypercholesterolemia, unspecified: Secondary | ICD-10-CM | POA: Diagnosis not present

## 2015-10-01 DIAGNOSIS — Z79899 Other long term (current) drug therapy: Secondary | ICD-10-CM | POA: Insufficient documentation

## 2015-10-01 DIAGNOSIS — Z8719 Personal history of other diseases of the digestive system: Secondary | ICD-10-CM | POA: Diagnosis not present

## 2015-10-01 DIAGNOSIS — R1032 Left lower quadrant pain: Secondary | ICD-10-CM | POA: Diagnosis not present

## 2015-10-01 DIAGNOSIS — R1030 Lower abdominal pain, unspecified: Secondary | ICD-10-CM

## 2015-10-01 DIAGNOSIS — Z8659 Personal history of other mental and behavioral disorders: Secondary | ICD-10-CM | POA: Insufficient documentation

## 2015-10-01 DIAGNOSIS — Z7982 Long term (current) use of aspirin: Secondary | ICD-10-CM | POA: Insufficient documentation

## 2015-10-01 DIAGNOSIS — E119 Type 2 diabetes mellitus without complications: Secondary | ICD-10-CM | POA: Insufficient documentation

## 2015-10-01 DIAGNOSIS — Z87891 Personal history of nicotine dependence: Secondary | ICD-10-CM | POA: Diagnosis not present

## 2015-10-01 DIAGNOSIS — I10 Essential (primary) hypertension: Secondary | ICD-10-CM | POA: Insufficient documentation

## 2015-10-01 DIAGNOSIS — Z7951 Long term (current) use of inhaled steroids: Secondary | ICD-10-CM | POA: Diagnosis not present

## 2015-10-01 LAB — COMPREHENSIVE METABOLIC PANEL
ALT: 16 U/L (ref 14–54)
ANION GAP: 11 (ref 5–15)
AST: 23 U/L (ref 15–41)
Albumin: 4.3 g/dL (ref 3.5–5.0)
Alkaline Phosphatase: 74 U/L (ref 38–126)
BUN: 11 mg/dL (ref 6–20)
CHLORIDE: 108 mmol/L (ref 101–111)
CO2: 23 mmol/L (ref 22–32)
Calcium: 9.8 mg/dL (ref 8.9–10.3)
Creatinine, Ser: 0.89 mg/dL (ref 0.44–1.00)
Glucose, Bld: 110 mg/dL — ABNORMAL HIGH (ref 65–99)
POTASSIUM: 4.3 mmol/L (ref 3.5–5.1)
Sodium: 142 mmol/L (ref 135–145)
TOTAL PROTEIN: 7.9 g/dL (ref 6.5–8.1)
Total Bilirubin: 0.5 mg/dL (ref 0.3–1.2)

## 2015-10-01 LAB — URINALYSIS, ROUTINE W REFLEX MICROSCOPIC
Bilirubin Urine: NEGATIVE
Glucose, UA: NEGATIVE mg/dL
Hgb urine dipstick: NEGATIVE
KETONES UR: NEGATIVE mg/dL
LEUKOCYTES UA: NEGATIVE
NITRITE: NEGATIVE
PH: 7.5 (ref 5.0–8.0)
Protein, ur: NEGATIVE mg/dL
Specific Gravity, Urine: 1.008 (ref 1.005–1.030)

## 2015-10-01 LAB — CBC
HEMATOCRIT: 41.7 % (ref 36.0–46.0)
HEMOGLOBIN: 12.9 g/dL (ref 12.0–15.0)
MCH: 26.8 pg (ref 26.0–34.0)
MCHC: 30.9 g/dL (ref 30.0–36.0)
MCV: 86.5 fL (ref 78.0–100.0)
Platelets: 272 10*3/uL (ref 150–400)
RBC: 4.82 MIL/uL (ref 3.87–5.11)
RDW: 12.8 % (ref 11.5–15.5)
WBC: 6.8 10*3/uL (ref 4.0–10.5)

## 2015-10-01 LAB — LIPASE, BLOOD: LIPASE: 89 U/L — AB (ref 11–51)

## 2015-10-01 MED ORDER — IOHEXOL 300 MG/ML  SOLN
100.0000 mL | Freq: Once | INTRAMUSCULAR | Status: AC | PRN
  Administered 2015-10-01: 100 mL via INTRAVENOUS

## 2015-10-01 MED ORDER — MORPHINE SULFATE (PF) 4 MG/ML IV SOLN
4.0000 mg | Freq: Once | INTRAVENOUS | Status: AC
  Administered 2015-10-01: 4 mg via INTRAVENOUS
  Filled 2015-10-01: qty 1

## 2015-10-01 MED ORDER — ONDANSETRON HCL 4 MG/2ML IJ SOLN
4.0000 mg | Freq: Once | INTRAMUSCULAR | Status: AC
  Administered 2015-10-01: 4 mg via INTRAVENOUS
  Filled 2015-10-01: qty 2

## 2015-10-01 NOTE — ED Notes (Signed)
Per pt, states lower abdominal pain-states chronic issue-has GI appointment in January-states hard to have a BM but when she does the BM is normal

## 2015-10-01 NOTE — Discharge Instructions (Signed)
Return to the ED with any concerns including vomiting and not able to keep down liquids, fever/chills, worsening pain, decreased level of alertness/lethargy, or any other alarming symptoms   The CT scan showed the following results:  You should arrange for followup with urology, call the number provided to further evaluate this.  1. No acute abdominal process. 2. 16 mm left renal mass, possibly early renal cell carcinoma. Urologic follow-up recommended. 3. Descending colon diverticulosis.

## 2015-10-01 NOTE — ED Provider Notes (Signed)
CSN: [id number]     Arrival date & time 10/01/15  1459 History   First MD Initiated Contact with Patient 10/01/15 1529     Chief Complaint  Patient presents with  . Abdominal Pain     (Consider location/radiation/quality/duration/timing/severity/associated sxs/prior Treatment) HPI  Pt presents with c/o lower abdominal pain. She states she has had similar pain in the past, but pain is worse today.  She states she called to get an appointment with GI, but they are not able to see her until January.  No fever.  Pain is in bilateral lower abdomen.  No diarrhea or vomiting associated.  She states she feels she may be constipated but is having normal bowel movements.  Pain is constant and sore in nature.  There are no other associated systemic symptoms, there are no other alleviating or modifying factors.   Past Medical History  Diagnosis Date  . Allergic rhinitis, cause unspecified   . Unspecified essential hypertension   . Nonspecific abnormal electrocardiogram (ECG) (EKG)   . Nonspecific (abnormal) findings on radiological and other examination of other intrathoracic organs   . Pure hypercholesterolemia   . Type II or unspecified type diabetes mellitus without mention of complication, not stated as uncontrolled   . Other dysphagia   . Diverticulosis of colon (without mention of hemorrhage)   . Benign neoplasm of colon   . Family history of malignant neoplasm of gastrointestinal tract   . Generalized osteoarthrosis, unspecified site   . Generalized headaches   . Unspecified tinnitus   . Anxiety state, unspecified    Past Surgical History  Procedure Laterality Date  . Cesarean section    . Foot surgery      bilateral for hammer toes   Family History  Problem Relation Age of Onset  . Stroke Father   . Lung cancer Father   . Coronary artery disease Father   . Hypertension Mother   . Heart attack Mother   . Colon cancer Brother 90   Social History  Substance Use Topics  .  Smoking status: Former Smoker    Types: Cigarettes    Quit date: 10/09/1999  . Smokeless tobacco: Never Used  . Alcohol Use: Yes     Comment: social use   OB History    Gravida Para Term Preterm AB TAB SAB Ectopic Multiple Living   '3 1 1  2     1     ' Review of Systems  ROS reviewed and all otherwise negative except for mentioned in HPI    Allergies  Review of patient's allergies indicates no known allergies.  Home Medications   Prior to Admission medications   Medication Sig Start Date End Date Taking? Authorizing Provider  ACCU-CHEK SOFTCLIX LANCETS lancets Use as directed to check blood glucose at least 3 times a day. E11.09 12/23/14  Yes Hoyt Koch, MD  amLODipine (NORVASC) 10 MG tablet Take 1 tablet by mouth  daily Patient taking differently: Take 10 mg by mouth daily.  11/29/14  Yes Hoyt Koch, MD  aspirin 81 MG tablet Take 81 mg by mouth daily.    Yes Historical Provider, MD  blood glucose meter kit and supplies KIT Dispense based on patient and insurance preference. Use up to four times daily as directed. (FOR ICD-10: E11.09). 12/06/14  Yes Hoyt Koch, MD  Cholecalciferol (VITAMIN D) 2000 UNITS CAPS Take 2,000 Units by mouth daily.    Yes Historical Provider, MD  fluticasone (FLONASE) 50 MCG/ACT nasal  spray Place 2 sprays into both nostrils daily. Patient taking differently: Place 2 sprays into both nostrils daily as needed for allergies.  05/03/15  Yes Hoyt Koch, MD  glucose blood (ACCU-CHEK AVIVA) test strip Use as directed to check blood glucose at least 3 times a day. E11.09 12/23/14  Yes Hoyt Koch, MD  metFORMIN (GLUCOPHAGE) 500 MG tablet Take 1 tablet by mouth  twice a day with a meal Patient taking differently: Take 500 mg by mouth 2 (two) times daily with a meal.  11/29/14  Yes Hoyt Koch, MD  simvastatin (ZOCOR) 40 MG tablet Take 1 tablet by mouth  every evening 11/29/14  Yes Hoyt Koch, MD   BP  137/78 mmHg  Pulse 99  Temp(Src) 98.5 F (36.9 C) (Oral)  Resp 16  SpO2 95%  Vitals reviewed Physical Exam  Physical Examination: General appearance - alert, well appearing, and in no distress Mental status - alert, oriented to person, place, and time Eyes - no conjunctival injection, no scleral icterus Mouth - mucous membranes moist, pharynx normal without lesions Chest - clear to auscultation, no wheezes, rales or rhonchi, symmetric air entry Heart - normal rate, regular rhythm, normal S1, S2, no murmurs, rubs, clicks or gallops Abdomen - soft, mild ttp in bilateral lower abdomen, no gaurding or rebound tendernes, nabs, nondistended, no masses or organomegaly Neurological - alert, oriented, normal speech Extremities - peripheral pulses normal, no pedal edema, no clubbing or cyanosis Skin - normal coloration and turgor, no rashes  ED Course  Procedures (including critical care time) Labs Review Labs Reviewed  LIPASE, BLOOD - Abnormal; Notable for the following:    Lipase 89 (*)    All other components within normal limits  COMPREHENSIVE METABOLIC PANEL - Abnormal; Notable for the following:    Glucose, Bld 110 (*)    All other components within normal limits  CBC  URINALYSIS, ROUTINE W REFLEX MICROSCOPIC (NOT AT [facility])    Imaging Review No results found. I have personally reviewed and evaluated these images and lab results as part of my medical decision-making.   EKG Interpretation None      MDM   Final diagnoses:  Lower abdominal pain    Pt presenting with worsening lower abdominal pain.  Labs are reassuring, CT scan does not have any acute findings, but did show a renal lesion.  This was discussed with patient and she was advised to arrange for urology followup.  Pt has an appointment with GI scheduled in the next 1-2 weeks.  Discharged with strict return precautions.  Pt agreeable with plan.    Alfonzo Beers, MD 10/04/15 [phone]

## 2015-10-05 ENCOUNTER — Telehealth: Payer: Self-pay | Admitting: Internal Medicine

## 2015-10-05 NOTE — Telephone Encounter (Signed)
Pt was in today to get a referral and Bonnita Nasuti was telling her it kept kicking it out.  She called [employer] and they said there was nothing on file regarding a referral and it shouldn't need to wait till the first of the year. [employer]'s # [id number] Ref # CBR [id number] I told pt Bonnita Nasuti will give her a call tomorrow

## 2015-10-07 NOTE — Telephone Encounter (Signed)
Bonnita Nasuti spoke w/pt.

## 2015-10-12 NOTE — Telephone Encounter (Signed)
Patient is calling to follow up on this. Advised patient that it has not been completed. Patient states that there are 2 referrals.   Dr. Alinda Money  [facility]   Dr. Cato Mulligan   Please follow up. Patients states that this is urgent.Marland Kitchen

## 2015-10-12 NOTE — Telephone Encounter (Signed)
Spoke w/pt. °

## 2015-10-14 ENCOUNTER — Telehealth: Payer: Self-pay | Admitting: Gastroenterology

## 2015-10-14 NOTE — Telephone Encounter (Signed)
Pt has been rescheduled to sooner appt with Janett Billow for 10/19/15

## 2015-10-18 ENCOUNTER — Ambulatory Visit (HOSPITAL_COMMUNITY)
Admission: RE | Admit: 2015-10-18 | Discharge: 2015-10-18 | Disposition: A | Discharge status: Home / Self Care | Payer: Commercial Managed Care - HMO | Source: Ambulatory Visit | Attending: Urology | Admitting: Urology

## 2015-10-18 ENCOUNTER — Other Ambulatory Visit (HOSPITAL_COMMUNITY): Payer: Self-pay | Admitting: Urology

## 2015-10-18 DIAGNOSIS — D49512 Neoplasm of unspecified behavior of left kidney: Secondary | ICD-10-CM

## 2015-10-18 DIAGNOSIS — Z0000 Encounter for general adult medical examination without abnormal findings: Secondary | ICD-10-CM | POA: Diagnosis not present

## 2015-10-18 DIAGNOSIS — D4102 Neoplasm of uncertain behavior of left kidney: Secondary | ICD-10-CM

## 2015-10-19 ENCOUNTER — Encounter: Payer: Self-pay | Admitting: Gastroenterology

## 2015-10-19 ENCOUNTER — Ambulatory Visit (INDEPENDENT_AMBULATORY_CARE_PROVIDER_SITE_OTHER): Payer: Commercial Managed Care - HMO | Admitting: Gastroenterology

## 2015-10-19 VITALS — BP 106/58 | HR 62 | Ht 64.5 in | Wt 169.0 lb

## 2015-10-19 DIAGNOSIS — Q394 Esophageal web: Secondary | ICD-10-CM

## 2015-10-19 DIAGNOSIS — K222 Esophageal obstruction: Secondary | ICD-10-CM | POA: Insufficient documentation

## 2015-10-19 DIAGNOSIS — R1310 Dysphagia, unspecified: Secondary | ICD-10-CM | POA: Insufficient documentation

## 2015-10-19 DIAGNOSIS — K5900 Constipation, unspecified: Secondary | ICD-10-CM

## 2015-10-19 HISTORY — DX: Constipation, unspecified: K59.00

## 2015-10-19 NOTE — Progress Notes (Signed)
10/19/2015 JA PISTOLE [id number] 09-26-1941   HISTORY OF PRESENT ILLNESS:  This is a 75 year old female who is known to Dr. Ardis Hughs. She had an EGD and colonoscopy in January 2013 at which time she is found have only mild diverticulosis on her colonoscopy exam. She does have a family history of colon cancer in her brother, however, so it was recommended that she repeat colonoscopy in 5 years from that time. On EGD she was found to have a Schatzki's ring that was dilated with 20 mm balloon.  Also had a small hiatal hernia.  She presents to the office today with recurrent complaints of solid food dysphagia. Similar to her issues in the past. This began again about 4 months or so ago and has been worsening slowly. Also complains of increase in belching. Denies any significant heartburn/reflux symptoms.  Is not on an H2 blocker or PPI.  She also complains of some constipation. She says that she does not really have hard stools, but she does have a difficult time expelling her stool /pushing it out. She says that at times she gets some back pain but then when she has a bowel movement it goes away. She denies any significant abdominal pain.   Past Medical History  Diagnosis Date  . Allergic rhinitis, cause unspecified   . Unspecified essential hypertension   . Nonspecific abnormal electrocardiogram (ECG) (EKG)   . Nonspecific (abnormal) findings on radiological and other examination of other intrathoracic organs   . Pure hypercholesterolemia   . Type II or unspecified type diabetes mellitus without mention of complication, not stated as uncontrolled   . Other dysphagia   . Diverticulosis of colon (without mention of hemorrhage)   . Benign neoplasm of colon   . Family history of malignant neoplasm of gastrointestinal tract   . Generalized osteoarthrosis, unspecified site   . Generalized headaches   . Unspecified tinnitus   . Anxiety state, unspecified    Past Surgical History    Procedure Laterality Date  . Cesarean section    . Foot surgery      bilateral for hammer toes    reports that she quit smoking about 16 years ago. Her smoking use included Cigarettes. She has never used smokeless tobacco. She reports that she drinks alcohol. She reports that she does not use illicit drugs. family history includes Colon cancer (age of onset: 49) in her brother; Coronary artery disease in her father; Heart attack in her mother; Hypertension in her mother; Lung cancer in her father; Stroke in her father. No Known Allergies    Outpatient Encounter Prescriptions as of 10/19/2015  Medication Sig  . ACCU-CHEK SOFTCLIX LANCETS lancets Use as directed to check blood glucose at least 3 times a day. E11.09  . amLODipine (NORVASC) 10 MG tablet Take 1 tablet by mouth  daily (Patient taking differently: Take 10 mg by mouth daily. )  . aspirin 81 MG tablet Take 81 mg by mouth daily.   . blood glucose meter kit and supplies KIT Dispense based on patient and insurance preference. Use up to four times daily as directed. (FOR ICD-10: E11.09).  . Cholecalciferol (VITAMIN D) 2000 UNITS CAPS Take 2,000 Units by mouth daily.   . fluticasone (FLONASE) 50 MCG/ACT nasal spray Place 2 sprays into both nostrils daily. (Patient taking differently: Place 2 sprays into both nostrils daily as needed for allergies. )  . glucose blood (ACCU-CHEK AVIVA) test strip Use as directed to check blood glucose at  least 3 times a day. E11.09  . metFORMIN (GLUCOPHAGE) 500 MG tablet Take 1 tablet by mouth  twice a day with a meal (Patient taking differently: Take 500 mg by mouth 2 (two) times daily with a meal. )  . simvastatin (ZOCOR) 40 MG tablet Take 1 tablet by mouth  every evening   No facility-administered encounter medications on file as of 10/19/2015.    REVIEW OF SYSTEMS  : All other systems reviewed and negative except where noted in the History of Present Illness.   PHYSICAL EXAM: BP 106/58 mmHg  Pulse  62  Ht 5' 4.5" (1.638 m)  Wt 169 lb (76.658 kg)  BMI 28.57 kg/m2 General: Well developed black female in no acute distress Head: Normocephalic and atraumatic Eyes:  Sclerae anicteric, conjunctiva pink. Ears: Normal auditory acuity Lungs: Clear throughout to auscultation Heart: Regular rate and rhythm.  Murmur noted. Abdomen: Soft, non-distended.  Normal bowel sounds.  Non-tender. Musculoskeletal: Symmetrical with no gross deformities  Skin: No lesions on visible extremities Extremities: No edema  Neurological: Alert oriented x 4, grossly non-focal Psychological:  Alert and cooperative. Normal mood and affect  ASSESSMENT AND PLAN: -Dysphagia:  Has history of  Schatzki's ring that was last dilated in 2013.  Now with recurrent solid food dysphagia.  Will schedule for EGD with dilation with Dr. Ardis Hughs. -Constipation:  Actually sounds like she has some pelvic floor dysfunction and difficulty expelling the stool at times.  Will begin using Miralax daily.    *The risks, benefits, and alternatives to EGD with dilation were discussed with the patient and she consents to proceed.   CC:  Hoyt Koch, *

## 2015-10-19 NOTE — Progress Notes (Signed)
i agree with the above note, plan 

## 2015-10-19 NOTE — Patient Instructions (Signed)
Please start a dose of Miralax daily.   You have been scheduled for an endoscopy. Please follow written instructions given to you at your visit today. If you use inhalers (even only as needed), please bring them with you on the day of your procedure. Your physician has requested that you go to www.[email or web address].com and enter the access code given to you at your visit today. This web site gives a general overview about your procedure. However, you should still follow specific instructions given to you by our office regarding your preparation for the procedure.

## 2015-10-24 ENCOUNTER — Ambulatory Visit: Payer: Medicare HMO | Admitting: Gastroenterology

## 2015-11-01 ENCOUNTER — Ambulatory Visit (HOSPITAL_COMMUNITY)
Admission: RE | Admit: 2015-11-01 | Discharge: 2015-11-01 | Disposition: A | Discharge status: Home / Self Care | Payer: Commercial Managed Care - HMO | Source: Ambulatory Visit | Attending: Urology | Admitting: Urology

## 2015-11-01 ENCOUNTER — Encounter (HOSPITAL_COMMUNITY): Payer: Self-pay

## 2015-11-01 DIAGNOSIS — N281 Cyst of kidney, acquired: Secondary | ICD-10-CM | POA: Insufficient documentation

## 2015-11-01 DIAGNOSIS — D49512 Neoplasm of unspecified behavior of left kidney: Secondary | ICD-10-CM | POA: Insufficient documentation

## 2015-11-01 MED ORDER — GADOBENATE DIMEGLUMINE 529 MG/ML IV SOLN
15.0000 mL | Freq: Once | INTRAVENOUS | Status: AC | PRN
  Administered 2015-11-01: 15 mL via INTRAVENOUS

## 2015-11-09 ENCOUNTER — Other Ambulatory Visit: Payer: Self-pay | Admitting: Urology

## 2015-11-09 DIAGNOSIS — N2889 Other specified disorders of kidney and ureter: Secondary | ICD-10-CM

## 2015-11-09 DIAGNOSIS — Z0000 Encounter for general adult medical examination without abnormal findings: Secondary | ICD-10-CM | POA: Diagnosis not present

## 2015-11-09 DIAGNOSIS — D49512 Neoplasm of unspecified behavior of left kidney: Secondary | ICD-10-CM | POA: Diagnosis not present

## 2015-11-10 ENCOUNTER — Telehealth: Payer: Self-pay | Admitting: *Deleted

## 2015-11-10 ENCOUNTER — Telehealth: Payer: Self-pay | Admitting: Internal Medicine

## 2015-11-10 MED ORDER — METFORMIN HCL 500 MG PO TABS: ORAL_TABLET | ORAL | Status: DC

## 2015-11-10 MED ORDER — ACCU-CHEK AVIVA DEVI: Status: DC

## 2015-11-10 MED ORDER — AMLODIPINE BESYLATE 10 MG PO TABS: ORAL_TABLET | ORAL | Status: DC

## 2015-11-10 MED ORDER — SIMVASTATIN 40 MG PO TABS: ORAL_TABLET | ORAL | Status: DC

## 2015-11-10 NOTE — Telephone Encounter (Signed)
Receive call back pt states she is now using [employer] need her amlodipine, Metformin, Simvastatin & Accu chek monitor sent to [facility]...Johny Chess

## 2015-11-10 NOTE — Telephone Encounter (Signed)
Left msg on triage needing refills on med to be sent to [facility]. Called pt back husband states she was not home will have her to give [place] a call back...Jenny Madden

## 2015-11-10 NOTE — Telephone Encounter (Signed)
Patient is requesting a [employer] referral to [facility].  Appt date is 2/28 at 10:30.  Patient is not sure what the appointment is for.

## 2015-11-11 ENCOUNTER — Ambulatory Visit (INDEPENDENT_AMBULATORY_CARE_PROVIDER_SITE_OTHER): Payer: Commercial Managed Care - HMO | Admitting: Nurse Practitioner

## 2015-11-11 ENCOUNTER — Encounter: Payer: Self-pay | Admitting: Nurse Practitioner

## 2015-11-11 VITALS — BP 120/60 | HR 104 | Temp 98.6°F | Ht 64.5 in | Wt 173.4 lb

## 2015-11-11 DIAGNOSIS — J029 Acute pharyngitis, unspecified: Secondary | ICD-10-CM | POA: Diagnosis not present

## 2015-11-11 MED ORDER — AZITHROMYCIN 250 MG PO TABS: ORAL_TABLET | ORAL | Status: DC

## 2015-11-11 MED ORDER — METFORMIN HCL 500 MG PO TABS: 500.0000 mg | ORAL_TABLET | Freq: Every day | ORAL | Status: DC

## 2015-11-11 NOTE — Patient Instructions (Signed)
Z-pack as directed.   Continue OTC measures- tylenol, flonase, allergy medications ect...  Please seek emergency care if trouble swallowing, breathing, or drooling.  If you get a fever or feel worsening despite treatment, also seek care.

## 2015-11-11 NOTE — Progress Notes (Signed)
Pre visit review using our clinic review tool, if applicable. No additional management support is needed unless otherwise documented below in the visit note. 

## 2015-11-11 NOTE — Progress Notes (Signed)
Patient ID: Jenny Madden, female    DOB: 1941/08/25  Age: 75 y.o. MRN: [id number]  CC: Sore Throat   HPI [name] presents for CC of sore throat x 3-4 days.   1) Feels like something is in throat, painful Feels like sore throat, swollen gland,   Hurt last night, resolved this morning Treatment to date:  Allergy meds Flonase  Cough drop last night  Washed mouth out with peroxide   History [name] has a past medical history of Allergic rhinitis, cause unspecified; Unspecified essential hypertension; Nonspecific abnormal electrocardiogram (ECG) (EKG); Nonspecific (abnormal) findings on radiological and other examination of other intrathoracic organs; Pure hypercholesterolemia; Type II or unspecified type diabetes mellitus without mention of complication, not stated as uncontrolled; Other dysphagia; Diverticulosis of colon (without mention of hemorrhage); Benign neoplasm of colon; Family history of malignant neoplasm of gastrointestinal tract; Generalized osteoarthrosis, unspecified site; Generalized headaches; Unspecified tinnitus; and Anxiety state, unspecified.   She has past surgical history that includes Cesarean section and Foot surgery.   Her family history includes Colon cancer (age of onset: 51) in her brother; Coronary artery disease in her father; Heart attack in her mother; Hypertension in her mother; Lung cancer in her father; Stroke in her father.She reports that she quit smoking about 16 years ago. Her smoking use included Cigarettes. She has never used smokeless tobacco. She reports that she drinks alcohol. She reports that she does not use illicit drugs.  Outpatient Prescriptions Prior to Visit  Medication Sig Dispense Refill  . ACCU-CHEK SOFTCLIX LANCETS lancets Use as directed to check blood glucose at least 3 times a day. E11.09 300 each 3  . amLODipine (NORVASC) 10 MG tablet Take 1 tablet by mouth  daily 90 tablet 3  . aspirin 81 MG tablet Take 81 mg by mouth  daily.     . blood glucose meter kit and supplies KIT Dispense based on patient and insurance preference. Use up to four times daily as directed. (FOR ICD-10: E11.09). 1 each 0  . Blood Glucose Monitoring Suppl (ACCU-CHEK AVIVA) device Use to check blood sugars daily Dx E11.9 1 each 0  . Cholecalciferol (VITAMIN D) 2000 UNITS CAPS Take 2,000 Units by mouth daily.     . fluticasone (FLONASE) 50 MCG/ACT nasal spray Place 2 sprays into both nostrils daily. (Patient taking differently: Place 2 sprays into both nostrils daily as needed for allergies. ) 16 g 11  . glucose blood (ACCU-CHEK AVIVA) test strip Use as directed to check blood glucose at least 3 times a day. E11.09 300 each 3  . simvastatin (ZOCOR) 40 MG tablet Take 1 tablet by mouth  every evening 90 tablet 3  . metFORMIN (GLUCOPHAGE) 500 MG tablet Take 1 tablet by mouth  twice a day with a meal (Patient taking differently: Take 500 mg by mouth daily with breakfast. Take 1 tablet by mouth  twice a day with a meal) 180 tablet 3   No facility-administered medications prior to visit.    ROS Review of Systems  Constitutional: Negative for fever, chills, diaphoresis and fatigue.  HENT: Positive for sore throat. Negative for trouble swallowing and voice change.   Respiratory: Negative for cough, chest tightness, shortness of breath and wheezing.   Cardiovascular: Negative for chest pain, palpitations and leg swelling.  Gastrointestinal: Negative for nausea, vomiting and diarrhea.  Skin: Negative for rash.  Neurological: Negative for dizziness and headaches.    Objective:  BP 120/60 mmHg  Pulse 104  Temp(Src) 98.6  F (37 C) (Oral)  Ht 5' 4.5" (1.638 m)  Wt 173 lb 6 oz (78.642 kg)  BMI 29.31 kg/m2  SpO2 97%   Physical Exam  Constitutional: She is oriented to person, place, and time. She appears well-developed and well-nourished. No distress.  HENT:  Head: Normocephalic and atraumatic.  Right Ear: External ear normal.  Left Ear:  External ear normal.  Mouth/Throat: Oropharynx is clear and moist. No oropharyngeal exudate.  TMs clear bilaterally  Eyes: EOM are normal. Pupils are equal, round, and reactive to light. Right eye exhibits no discharge. Left eye exhibits no discharge. No scleral icterus.  Neck: Normal range of motion. Neck supple.  Cardiovascular: Normal rate, regular rhythm and normal heart sounds.  Exam reveals no gallop and no friction rub.   No murmur heard. Pulmonary/Chest: Effort normal and breath sounds normal. No respiratory distress. She has no wheezes. She has no rales. She exhibits no tenderness.  Lymphadenopathy:    She has cervical adenopathy.  Neurological: She is alert and oriented to person, place, and time. No cranial nerve deficit. She exhibits normal muscle tone. Coordination normal.  Skin: Skin is warm and dry. No rash noted. She is not diaphoretic.  Psychiatric: She has a normal mood and affect. Her behavior is normal. Judgment and thought content normal.   Assessment & Plan:   Charise was seen today for sore throat.  Diagnoses and all orders for this visit:  Viral pharyngitis  Other orders -     metFORMIN (GLUCOPHAGE) 500 MG tablet; Take 1 tablet (500 mg total) by mouth daily with breakfast. -     Discontinue: azithromycin (ZITHROMAX) 250 MG tablet; Take 2 tablets by mouth on day 1, take 1 tablet by mouth each day after for 4 days. -     azithromycin (ZITHROMAX) 250 MG tablet; Take 2 tablets by mouth on day 1, take 1 tablet by mouth each day after for 4 days.  I have changed [name]'s metFORMIN. I am also having her maintain her aspirin, Vitamin D, blood glucose meter kit and supplies, ACCU-CHEK SOFTCLIX LANCETS, glucose blood, fluticasone, amLODipine, simvastatin, ACCU-CHEK AVIVA, and azithromycin.  Meds ordered this encounter  Medications  . metFORMIN (GLUCOPHAGE) 500 MG tablet    Sig: Take 1 tablet (500 mg total) by mouth daily with breakfast.    Dispense:  180 tablet     Refill:  3  . DISCONTD: azithromycin (ZITHROMAX) 250 MG tablet    Sig: Take 2 tablets by mouth on day 1, take 1 tablet by mouth each day after for 4 days.    Dispense:  6 each    Refill:  0    Order Specific Question:  Supervising Provider    Answer:  Deborra Medina L [[id number]]  . azithromycin (ZITHROMAX) 250 MG tablet    Sig: Take 2 tablets by mouth on day 1, take 1 tablet by mouth each day after for 4 days.    Dispense:  6 each    Refill:  0    Order Specific Question:  Supervising Provider    Answer:  Crecencio Mc [[id number]]     Follow-up: Return if symptoms worsen or fail to improve.

## 2015-11-14 NOTE — Telephone Encounter (Signed)
Dr Alinda Money ordered test. Informed patient that his office is responsible for pre-cert if needed.

## 2015-11-15 DIAGNOSIS — J029 Acute pharyngitis, unspecified: Secondary | ICD-10-CM | POA: Insufficient documentation

## 2015-11-15 NOTE — Assessment & Plan Note (Signed)
New onset Z-pack as directed.  Continue OTC measures- tylenol, flonase, allergy medications ect... Please seek emergency care if trouble swallowing, breathing, or drooling.  If you get a fever or feel worsening despite treatment, also seek care. Patient verbalized understanding of instructions Follow-up of worsening or failure to improve

## 2015-11-28 ENCOUNTER — Encounter: Payer: Commercial Managed Care - HMO | Admitting: Gastroenterology

## 2015-12-06 ENCOUNTER — Ambulatory Visit
Admission: RE | Admit: 2015-12-06 | Discharge: 2015-12-06 | Disposition: A | Discharge status: Home / Self Care | Payer: Commercial Managed Care - HMO | Source: Ambulatory Visit | Attending: Urology | Admitting: Urology

## 2015-12-06 DIAGNOSIS — N2889 Other specified disorders of kidney and ureter: Secondary | ICD-10-CM

## 2015-12-06 HISTORY — PX: IR GENERIC HISTORICAL: IMG1180011

## 2015-12-13 ENCOUNTER — Other Ambulatory Visit: Payer: Self-pay | Admitting: Interventional Radiology

## 2015-12-13 DIAGNOSIS — N2889 Other specified disorders of kidney and ureter: Secondary | ICD-10-CM

## 2015-12-19 ENCOUNTER — Other Ambulatory Visit: Payer: Self-pay | Admitting: Internal Medicine

## 2015-12-20 NOTE — Consult Note (Signed)
Chief Complaint: Patient was seen in consultation today for left renal mass ablation at the request of Raynelle Bring  Referring Physician(s): Raynelle Bring  History of Present Illness: Jenny Madden is a 75 y.o. female with a history of an incidentally detected 1.6 cm left renal mass at the time of CT of the abdomen on 10/01/2015 through the [facility] Emergency Department due to abdominal pain. The lesion is partially exophytic and emanates from the medial cortex of the interpolar left kidney. The lesion appears solid and demonstrates contrast-enhancement. MRI characterization was performed on 11/01/2015 demonstrating similar lesion size without evidence of interval growth. MR characteristics are consistent with a solid neoplasm, likely representing an early renal cell carcinoma. There is no evidence of renal vein involvement or regional metastatic disease by imaging. The patient is currently completely asymptomatic and has not had any hematuria or other symptoms referral to the urinary tract. She has no history of prior malignancy or prior renal surgery.  Past Medical History  Diagnosis Date  . Allergic rhinitis, cause unspecified   . Unspecified essential hypertension   . Nonspecific abnormal electrocardiogram (ECG) (EKG)   . Nonspecific (abnormal) findings on radiological and other examination of other intrathoracic organs   . Pure hypercholesterolemia   . Type II or unspecified type diabetes mellitus without mention of complication, not stated as uncontrolled   . Other dysphagia   . Diverticulosis of colon (without mention of hemorrhage)   . Benign neoplasm of colon   . Family history of malignant neoplasm of gastrointestinal tract   . Generalized osteoarthrosis, unspecified site   . Generalized headaches   . Unspecified tinnitus   . Anxiety state, unspecified     Past Surgical History  Procedure Laterality Date  . Cesarean section    . Foot surgery      bilateral  for hammer toes    Allergies: Review of patient's allergies indicates no known allergies.  Medications: Prior to Admission medications   Medication Sig Start Date End Date Taking? Authorizing Provider  ACCU-CHEK SOFTCLIX LANCETS lancets Use as directed to check blood glucose at least 3 times a day. E11.09 12/23/14   Hoyt Koch, MD  amLODipine (NORVASC) 10 MG tablet Take 1 tablet by mouth  daily 11/10/15   Hoyt Koch, MD  amLODipine (NORVASC) 10 MG tablet TAKE 1 TABLET BY MOUTH DAILY 12/19/15   Hoyt Koch, MD  aspirin 81 MG tablet Take 81 mg by mouth daily.     Historical Provider, MD  azithromycin (ZITHROMAX) 250 MG tablet Take 2 tablets by mouth on day 1, take 1 tablet by mouth each day after for 4 days. 11/11/15   Rubbie Battiest, NP  blood glucose meter kit and supplies KIT Dispense based on patient and insurance preference. Use up to four times daily as directed. (FOR ICD-10: E11.09). 12/06/14   Hoyt Koch, MD  Blood Glucose Monitoring Suppl [facility] AVIVA) device Use to check blood sugars daily Dx E11.9 11/10/15   Hoyt Koch, MD  Cholecalciferol (VITAMIN D) 2000 UNITS CAPS Take 2,000 Units by mouth daily.     Historical Provider, MD  fluticasone (FLONASE) 50 MCG/ACT nasal spray Place 2 sprays into both nostrils daily. Patient taking differently: Place 2 sprays into both nostrils daily as needed for allergies.  05/03/15   Hoyt Koch, MD  glucose blood (ACCU-CHEK AVIVA) test strip Use as directed to check blood glucose at least 3 times a  day. E11.09 12/23/14   Hoyt Koch, MD  metFORMIN (GLUCOPHAGE) 500 MG tablet Take 1 tablet (500 mg total) by mouth daily with breakfast. 11/11/15   Hoyt Koch, MD  metFORMIN (GLUCOPHAGE) 500 MG tablet TAKE 1 TABLET BY MOUTH TWICE DAILY WITH A MEAL 12/19/15   Hoyt Koch, MD  simvastatin (ZOCOR) 40 MG tablet Take 1 tablet by mouth  every evening 11/10/15   Hoyt Koch, MD    simvastatin (ZOCOR) 40 MG tablet TAKE 1 TABLET BY MOUTH EVERY EVENING 12/19/15   Hoyt Koch, MD     Family History  Problem Relation Age of Onset  . Stroke Father   . Lung cancer Father   . Coronary artery disease Father   . Hypertension Mother   . Heart attack Mother   . Colon cancer Brother 61    Social History   Social History  . Marital Status: Married    Spouse Name: bob x 37 yrs  . Number of Children: 3  . Years of Education: N/A   Occupational History  . cook   .     Social History Main Topics  . Smoking status: Former Smoker    Types: Cigarettes    Quit date: 10/09/1999  . Smokeless tobacco: Never Used  . Alcohol Use: Yes     Comment: social use  . Drug Use: No  . Sexual Activity: No   Other Topics Concern  . Not on file   Social History Narrative   Married to husband, [name] x24yr   3 children - 1 son w/ Hodgkin's, 2 daughters alive and well   High school education    Review of Systems: A 12 point ROS discussed and pertinent positives are indicated in the HPI above.  All other systems are negative.  Review of Systems  Constitutional: Negative.   HENT: Negative.   Respiratory: Negative.   Cardiovascular: Negative.   Gastrointestinal: Negative.   Endocrine: Negative.   Genitourinary: Negative.   Musculoskeletal: Negative.   Skin: Negative.   Neurological: Negative.     Vital Signs: BP 149/75 mmHg  Pulse 95  Temp(Src) 97.8 F (36.6 C) (Oral)  Resp 14  Ht 5' 4.5" (1.638 m)  Wt 170 lb (77.111 kg)  BMI 28.74 kg/m2  SpO2 99%  Physical Exam  Constitutional: She is oriented to person, place, and time. She appears well-developed and well-nourished. No distress.  HENT:  Head: Normocephalic and atraumatic.  Neck: Neck supple. No JVD present.  Cardiovascular: Normal rate, regular rhythm and normal heart sounds.  Exam reveals no gallop and no friction rub.   No murmur heard. Pulmonary/Chest: Effort normal and breath sounds normal. No  respiratory distress. She has no wheezes. She has no rales.  Abdominal: Soft. Bowel sounds are normal. She exhibits no distension and no mass. There is no tenderness. There is no rebound and no guarding.  Musculoskeletal: She exhibits no edema or tenderness.  Neurological: She is alert and oriented to person, place, and time.  Skin: She is not diaphoretic.  Nursing note and vitals reviewed.   Imaging: No results found.  Labs:  CBC:  Recent Labs  10/01/15 1549  WBC 6.8  HGB 12.9  HCT 41.7  PLT 272    COAGS: No results for input(s): INR, APTT in the last 8760 hours.  BMP:  Recent Labs  07/13/15 0851 10/01/15 1549  NA 142 142  K 4.0 4.3  CL 105 108  CO2 28 23  GLUCOSE  111* 110*  BUN 7 11  CALCIUM 10.1 9.8  CREATININE 0.88 0.89  GFRNONAA  --  >60  GFRAA  --  >60   Additional labs at [facility] on 11/09/2015 demonstrate BUN 10, creatinine 0.79 and estimated GFR 85 mL per minute.   LIVER FUNCTION TESTS:  Recent Labs  07/13/15 0851 10/01/15 1549  BILITOT 0.3 0.5  AST 17 23  ALT 13 16  ALKPHOS 63 74  PROT 7.7 7.9  ALBUMIN 4.3 4.3    TUMOR MARKERS: No results for input(s): AFPTM, CEA, CA199, CHROMGRNA in the last 8760 hours.  Assessment and Plan:  I met with Jenny Madden and reviewed imaging findings with her. We discussed treatment options for the left renal mass including continued imaging surveillance, partial nephrectomy and percutaneous cryoablation.  The lesion is of size and location amenable to percutaneous ablation. We discussed the pros and cons of a less invasive nephron sparing procedure. Given her age, a less invasive procedure may be beneficial.  At the size of the lesion, a single cryoablation procedure would likely be greater than 90% successful in treating the lesion without recurrence.  After discussing options, the patient would like to proceed with scheduling cryoablation of the left renal mass. Separate biopsy would not be necessary  and could be attempted at the time of the procedure to establish a tissue diagnosis. This mass is statistically quite likely to represent either a clear cell or papillary carcinoma.  We will begin the authorization and scheduling process. I anticipate being able to schedule cryoablation under general anesthesia in approximately one month.  Thank you for this interesting consult.  I greatly enjoyed meeting Jenny Madden and look forward to participating in their care.  A copy of this report was sent to the requesting provider on this date.  Electronically SignedAletta Edouard T 12/20/2015, 10:16 AM   I spent a total of 40 Minutes in face to face in clinical consultation, greater than 50% of which was counseling/coordinating care for a left renal mass.

## 2015-12-30 DIAGNOSIS — H40023 Open angle with borderline findings, high risk, bilateral: Secondary | ICD-10-CM | POA: Diagnosis not present

## 2015-12-30 DIAGNOSIS — H04123 Dry eye syndrome of bilateral lacrimal glands: Secondary | ICD-10-CM | POA: Diagnosis not present

## 2016-01-06 ENCOUNTER — Other Ambulatory Visit: Payer: Self-pay | Admitting: Internal Medicine

## 2016-01-07 DIAGNOSIS — N189 Chronic kidney disease, unspecified: Secondary | ICD-10-CM

## 2016-01-07 HISTORY — PX: RADIOFREQUENCY ABLATION KIDNEY: SHX2292

## 2016-01-07 HISTORY — DX: Chronic kidney disease, unspecified: N18.9

## 2016-01-07 HISTORY — PX: ABLATION: SHX5711

## 2016-01-09 ENCOUNTER — Other Ambulatory Visit: Payer: Self-pay | Admitting: General Surgery

## 2016-01-10 NOTE — Progress Notes (Signed)
CHEST XRAY 10-18-15 EPIC EKG 07-13-15 EPIC

## 2016-01-10 NOTE — Patient Instructions (Signed)
Jenny Madden  01/10/2016   Your procedure is scheduled on: 01-18-16  Report to [facility] Main  Entrance take [facility]  elevators to 3rd floor to  [place] at 930 AM.  Call this number if you have problems the morning of surgery [phone]   Remember: ONLY 1 PERSON MAY GO WITH YOU TO SHORT STAY TO GET  READY MORNING OF [place].  Do not eat food or drink liquids :After Midnight.     Take these medicines the morning of surgery with A SIP OF WATER: AMLODIPINE (NORVASC), FLONASE NASAL SPRAY DO NOT TAKE ANY DIABETIC MEDICATIONS DAY OF YOUR SURGERY                               You may not have any metal on your body including hair pins and              piercings  Do not wear jewelry, make-up, lotions, powders or perfumes, deodorant             Do not wear nail polish.  Do not shave  48 hours prior to surgery.              Men may shave face and neck.   Do not bring valuables to the hospital. [place].  Contacts, dentures or bridgework may not be worn into surgery.  Leave suitcase in the car. After surgery it may be brought to your room.     Patients discharged the day of surgery will not be allowed to drive home.  Name and phone number of your driver:  Special Instructions: N/A              Please read over the following fact sheets you were given: _____________________________________________________________________             [facility] - Preparing for Surgery Before surgery, you can play an important role.  Because skin is not sterile, your skin needs to be as free of germs as possible.  You can reduce the number of germs on your skin by washing with CHG (chlorahexidine gluconate) soap before surgery.  CHG is an antiseptic cleaner which kills germs and bonds with the skin to continue killing germs even after washing. Please DO NOT use if you have an allergy to CHG or antibacterial soaps.   If your skin becomes reddened/irritated stop using the CHG and inform your nurse when you arrive at Short Stay. Do not shave (including legs and underarms) for at least 48 hours prior to the first CHG shower.  You may shave your face/neck. Please follow these instructions carefully:  1.  Shower with CHG Soap the night before surgery and the  morning of Surgery.  2.  If you choose to wash your hair, wash your hair first as usual with your  normal  shampoo.  3.  After you shampoo, rinse your hair and body thoroughly to remove the  shampoo.                           4.  Use CHG as you would any other liquid soap.  You can apply chg directly  to the skin  and wash                       Gently with a scrungie or clean washcloth.  5.  Apply the CHG Soap to your body ONLY FROM THE NECK DOWN.   Do not use on face/ open                           Wound or open sores. Avoid contact with eyes, ears mouth and genitals (private parts).                       Wash face,  Genitals (private parts) with your normal soap.             6.  Wash thoroughly, paying special attention to the area where your surgery  will be performed.  7.  Thoroughly rinse your body with warm water from the neck down.  8.  DO NOT shower/wash with your normal soap after using and rinsing off  the CHG Soap.                9.  Pat yourself dry with a clean towel.            10.  Wear clean pajamas.            11.  Place clean sheets on your bed the night of your first shower and do not  sleep with pets. Day of Surgery : Do not apply any lotions/deodorants the morning of surgery.  Please wear clean clothes to the hospital/surgery center.  FAILURE TO FOLLOW THESE INSTRUCTIONS MAY RESULT IN THE CANCELLATION OF YOUR SURGERY PATIENT SIGNATURE_________________________________  NURSE SIGNATURE__________________________________  ________________________________________________________________________

## 2016-01-11 ENCOUNTER — Ambulatory Visit: Payer: Medicare HMO | Admitting: Internal Medicine

## 2016-01-11 ENCOUNTER — Other Ambulatory Visit: Payer: Self-pay | Admitting: Radiology

## 2016-01-12 ENCOUNTER — Encounter (HOSPITAL_COMMUNITY)
Admission: RE | Admit: 2016-01-12 | Discharge: 2016-01-12 | Disposition: A | Discharge status: Home / Self Care | Payer: Commercial Managed Care - HMO | Source: Ambulatory Visit | Attending: Interventional Radiology | Admitting: Interventional Radiology

## 2016-01-12 ENCOUNTER — Encounter (HOSPITAL_COMMUNITY): Payer: Self-pay

## 2016-01-12 DIAGNOSIS — Z01818 Encounter for other preprocedural examination: Secondary | ICD-10-CM | POA: Diagnosis not present

## 2016-01-12 DIAGNOSIS — N2889 Other specified disorders of kidney and ureter: Secondary | ICD-10-CM | POA: Diagnosis not present

## 2016-01-12 DIAGNOSIS — I498 Other specified cardiac arrhythmias: Secondary | ICD-10-CM | POA: Diagnosis not present

## 2016-01-12 DIAGNOSIS — Z01812 Encounter for preprocedural laboratory examination: Secondary | ICD-10-CM | POA: Insufficient documentation

## 2016-01-12 HISTORY — DX: Cardiac murmur, unspecified: R01.1

## 2016-01-12 HISTORY — DX: Family history of other specified conditions: Z84.89

## 2016-01-12 LAB — CBC
HEMATOCRIT: 40.8 % (ref 36.0–46.0)
HEMOGLOBIN: 12.8 g/dL (ref 12.0–15.0)
MCH: 26 pg (ref 26.0–34.0)
MCHC: 31.4 g/dL (ref 30.0–36.0)
MCV: 82.9 fL (ref 78.0–100.0)
Platelets: 243 10*3/uL (ref 150–400)
RBC: 4.92 MIL/uL (ref 3.87–5.11)
RDW: 12.9 % (ref 11.5–15.5)
WBC: 6.1 10*3/uL (ref 4.0–10.5)

## 2016-01-12 LAB — BASIC METABOLIC PANEL
Anion gap: 8 (ref 5–15)
BUN: 13 mg/dL (ref 6–20)
CO2: 26 mmol/L (ref 22–32)
Calcium: 9.5 mg/dL (ref 8.9–10.3)
Chloride: 109 mmol/L (ref 101–111)
Creatinine, Ser: 0.89 mg/dL (ref 0.44–1.00)
GFR calc Af Amer: >60 mL/min (ref >60)
GFR calc non Af Amer: >60 mL/min (ref >60)
GLUCOSE: 92 mg/dL (ref 65–99)
POTASSIUM: 4.3 mmol/L (ref 3.5–5.1)
Sodium: 143 mmol/L (ref 135–145)

## 2016-01-12 LAB — PROTIME-INR
INR: 1.16 (ref 0.00–1.49)
Prothrombin Time: 14.5 seconds (ref 11.6–15.2)

## 2016-01-12 LAB — APTT: APTT: 27 s (ref 24–37)

## 2016-01-12 LAB — ABO/RH: ABO/RH(D): B POS

## 2016-01-12 NOTE — Progress Notes (Signed)
Spoke with dr Marcell Barlow anesthesia, pt needs ekg with pre op, already ordered per dr Demetrios Loll, anesthesia will see pt day of procedure per dr Marcell Barlow.

## 2016-01-13 LAB — HEMOGLOBIN A1C
HEMOGLOBIN A1C: 6.7 % — AB (ref 4.8–5.6)
Mean Plasma Glucose: 146 mg/dL

## 2016-01-17 ENCOUNTER — Ambulatory Visit: Payer: Commercial Managed Care - HMO | Admitting: Internal Medicine

## 2016-01-17 ENCOUNTER — Other Ambulatory Visit: Payer: Self-pay | Admitting: Radiology

## 2016-01-18 ENCOUNTER — Observation Stay (HOSPITAL_COMMUNITY)
Admission: RE | Admit: 2016-01-18 | Discharge: 2016-01-19 | Disposition: A | Discharge status: Home / Self Care | Payer: Commercial Managed Care - HMO | Source: Ambulatory Visit | Attending: Interventional Radiology | Admitting: Interventional Radiology

## 2016-01-18 ENCOUNTER — Ambulatory Visit (HOSPITAL_COMMUNITY): Payer: Commercial Managed Care - HMO | Admitting: Anesthesiology

## 2016-01-18 ENCOUNTER — Encounter (HOSPITAL_COMMUNITY): Payer: Self-pay

## 2016-01-18 ENCOUNTER — Encounter (HOSPITAL_COMMUNITY)
Admission: RE | Disposition: A | Discharge status: Home / Self Care | Payer: Self-pay | Source: Ambulatory Visit | Attending: Interventional Radiology

## 2016-01-18 ENCOUNTER — Encounter (HOSPITAL_COMMUNITY): Payer: Self-pay | Admitting: *Deleted

## 2016-01-18 ENCOUNTER — Ambulatory Visit (HOSPITAL_COMMUNITY)
Admission: RE | Admit: 2016-01-18 | Discharge: 2016-01-18 | Disposition: A | Discharge status: Home / Self Care | Payer: Commercial Managed Care - HMO | Source: Ambulatory Visit | Attending: Interventional Radiology | Admitting: Interventional Radiology

## 2016-01-18 DIAGNOSIS — D49512 Neoplasm of unspecified behavior of left kidney: Principal | ICD-10-CM | POA: Insufficient documentation

## 2016-01-18 DIAGNOSIS — Z7982 Long term (current) use of aspirin: Secondary | ICD-10-CM | POA: Insufficient documentation

## 2016-01-18 DIAGNOSIS — Z7984 Long term (current) use of oral hypoglycemic drugs: Secondary | ICD-10-CM | POA: Insufficient documentation

## 2016-01-18 DIAGNOSIS — E7800 Pure hypercholesterolemia, unspecified: Secondary | ICD-10-CM | POA: Diagnosis not present

## 2016-01-18 DIAGNOSIS — Z79899 Other long term (current) drug therapy: Secondary | ICD-10-CM | POA: Diagnosis not present

## 2016-01-18 DIAGNOSIS — Z800 Family history of malignant neoplasm of digestive organs: Secondary | ICD-10-CM | POA: Diagnosis not present

## 2016-01-18 DIAGNOSIS — J309 Allergic rhinitis, unspecified: Secondary | ICD-10-CM | POA: Insufficient documentation

## 2016-01-18 DIAGNOSIS — I10 Essential (primary) hypertension: Secondary | ICD-10-CM | POA: Diagnosis not present

## 2016-01-18 DIAGNOSIS — E119 Type 2 diabetes mellitus without complications: Secondary | ICD-10-CM | POA: Insufficient documentation

## 2016-01-18 DIAGNOSIS — Z801 Family history of malignant neoplasm of trachea, bronchus and lung: Secondary | ICD-10-CM | POA: Insufficient documentation

## 2016-01-18 DIAGNOSIS — Z01812 Encounter for preprocedural laboratory examination: Secondary | ICD-10-CM | POA: Diagnosis not present

## 2016-01-18 DIAGNOSIS — I498 Other specified cardiac arrhythmias: Secondary | ICD-10-CM | POA: Diagnosis not present

## 2016-01-18 DIAGNOSIS — N2889 Other specified disorders of kidney and ureter: Secondary | ICD-10-CM | POA: Diagnosis not present

## 2016-01-18 DIAGNOSIS — Z01818 Encounter for other preprocedural examination: Secondary | ICD-10-CM | POA: Diagnosis not present

## 2016-01-18 LAB — TYPE AND SCREEN
ABO/RH(D): B POS
Antibody Screen: NEGATIVE

## 2016-01-18 LAB — GLUCOSE, CAPILLARY
GLUCOSE-CAPILLARY: 102 mg/dL — AB (ref 65–99)
Glucose-Capillary: 98 mg/dL (ref 65–99)

## 2016-01-18 SURGERY — RADIO FREQUENCY ABLATION
Anesthesia: General | Laterality: Left

## 2016-01-18 MED ORDER — CISATRACURIUM BESYLATE 20 MG/10ML IV SOLN
INTRAVENOUS | Status: AC
  Filled 2016-01-18: qty 20

## 2016-01-18 MED ORDER — CISATRACURIUM BESYLATE (PF) 10 MG/5ML IV SOLN
INTRAVENOUS | Status: DC | PRN
  Administered 2016-01-18: 4 mg via INTRAVENOUS
  Administered 2016-01-18: 14 mg via INTRAVENOUS

## 2016-01-18 MED ORDER — HYDROMORPHONE HCL 1 MG/ML IJ SOLN
0.2500 mg | INTRAMUSCULAR | Status: DC | PRN
  Administered 2016-01-18 (×2): 0.5 mg via INTRAVENOUS

## 2016-01-18 MED ORDER — DOCUSATE SODIUM 100 MG PO CAPS
100.0000 mg | ORAL_CAPSULE | Freq: Two times a day (BID) | ORAL | Status: DC
  Administered 2016-01-18 – 2016-01-19 (×2): 100 mg via ORAL
  Filled 2016-01-18 (×5): qty 1

## 2016-01-18 MED ORDER — LIDOCAINE HCL (CARDIAC) 20 MG/ML IV SOLN
INTRAVENOUS | Status: DC | PRN
Start: 2016-01-18 — End: 2016-01-18
  Administered 2016-01-18: 80 mg via INTRAVENOUS

## 2016-01-18 MED ORDER — CEFAZOLIN SODIUM-DEXTROSE 2-4 GM/100ML-% IV SOLN
2.0000 g | INTRAVENOUS | Status: AC
  Administered 2016-01-18: 2 g via INTRAVENOUS
  Filled 2016-01-18: qty 100

## 2016-01-18 MED ORDER — HYDROMORPHONE HCL 1 MG/ML IJ SOLN
INTRAMUSCULAR | Status: AC
  Filled 2016-01-18: qty 1

## 2016-01-18 MED ORDER — ONDANSETRON HCL 4 MG/2ML IJ SOLN
4.0000 mg | Freq: Four times a day (QID) | INTRAMUSCULAR | Status: DC | PRN
  Administered 2016-01-18: 4 mg via INTRAVENOUS
  Filled 2016-01-18: qty 2

## 2016-01-18 MED ORDER — PROCHLORPERAZINE EDISYLATE 5 MG/ML IJ SOLN
10.0000 mg | INTRAMUSCULAR | Status: DC | PRN
  Administered 2016-01-18: 10 mg via INTRAVENOUS
  Filled 2016-01-18: qty 2

## 2016-01-18 MED ORDER — PHENYLEPHRINE HCL 10 MG/ML IJ SOLN
INTRAMUSCULAR | Status: DC | PRN
  Administered 2016-01-18 (×4): 120 ug via INTRAVENOUS

## 2016-01-18 MED ORDER — GLYCOPYRROLATE 0.2 MG/ML IJ SOLN
INTRAMUSCULAR | Status: DC | PRN
  Administered 2016-01-18: .6 mg via INTRAVENOUS

## 2016-01-18 MED ORDER — HYDROCODONE-ACETAMINOPHEN 5-325 MG PO TABS
1.0000 | ORAL_TABLET | ORAL | Status: DC | PRN
  Administered 2016-01-19: 1 via ORAL
  Filled 2016-01-18: qty 2

## 2016-01-18 MED ORDER — FENTANYL CITRATE (PF) 100 MCG/2ML IJ SOLN
INTRAMUSCULAR | Status: DC | PRN
  Administered 2016-01-18: 100 ug via INTRAVENOUS

## 2016-01-18 MED ORDER — NEOSTIGMINE METHYLSULFATE 10 MG/10ML IV SOLN
INTRAVENOUS | Status: DC | PRN
  Administered 2016-01-18: 4 mg via INTRAVENOUS

## 2016-01-18 MED ORDER — LACTATED RINGERS IV SOLN
INTRAVENOUS | Status: DC
  Administered 2016-01-18 (×2): via INTRAVENOUS

## 2016-01-18 MED ORDER — SENNOSIDES-DOCUSATE SODIUM 8.6-50 MG PO TABS
1.0000 | ORAL_TABLET | Freq: Every day | ORAL | Status: DC | PRN
  Filled 2016-01-18: qty 1

## 2016-01-18 MED ORDER — FENTANYL CITRATE (PF) 250 MCG/5ML IJ SOLN
INTRAMUSCULAR | Status: AC
  Filled 2016-01-18: qty 5

## 2016-01-18 MED ORDER — PROPOFOL 10 MG/ML IV BOLUS
INTRAVENOUS | Status: DC | PRN
  Administered 2016-01-18: 30 mg via INTRAVENOUS
  Administered 2016-01-18: 140 mg via INTRAVENOUS

## 2016-01-18 MED ORDER — SODIUM CHLORIDE 0.9 % IV SOLN
INTRAVENOUS | Status: DC
  Administered 2016-01-18: 17:00:00 via INTRAVENOUS

## 2016-01-18 NOTE — H&P (Signed)
Chief Complaint: left renal mass  Referring Physician: Dr. Raynelle Bring  Supervising Physician: Aletta Edouard  HPI: Jenny Madden is an 75 y.o. female who was seen by Dr. Kathlene Cote after an incidentally detected 1.6cm left renal mass was noted on a CT scan in December of 2016.  She then had a MRI which revealed this was contrast- enhancing and appeared to be a solid neoplasm c/w early RCC.  She was referred to IR for evaluation for treatement.  Past Medical History:  Past Medical History  Diagnosis Date  . Allergic rhinitis, cause unspecified   . Unspecified essential hypertension   . Nonspecific abnormal electrocardiogram (ECG) (EKG)   . Nonspecific (abnormal) findings on radiological and other examination of other intrathoracic organs   . Pure hypercholesterolemia   . Type II or unspecified type diabetes mellitus without mention of complication, not stated as uncontrolled   . Other dysphagia   . Diverticulosis of colon (without mention of hemorrhage)   . Benign neoplasm of colon   . Family history of malignant neoplasm of gastrointestinal tract   . Unspecified tinnitus years ago  . Heart murmur   . Anxiety state, unspecified     none recent  . Family history of adverse reaction to anesthesia 3 yrs ago    slow to awaken     Past Surgical History:  Past Surgical History  Procedure Laterality Date  . Cesarean section      x 1  . Foot surgery      bilateral for hammer toes  . Colonscopy and endoscopy  5 yrs ago    Family History:  Family History  Problem Relation Age of Onset  . Stroke Father   . Lung cancer Father   . Coronary artery disease Father   . Hypertension Mother   . Heart attack Mother   . Colon cancer Brother 83    Social History:  reports that she quit smoking about 16 years ago. Her smoking use included Cigarettes. She has never used smokeless tobacco. She reports that she drinks alcohol. She reports that she does not use illicit  drugs.  Allergies: No Known Allergies  Medications:   Medication List    ASK your doctor about these medications        ACCU-CHEK AVIVA device  Use to check blood sugars daily Dx E11.9     ACCU-CHEK AVIVA PLUS w/Device Kit  USE  TO CHECK BLOOD SUGAR EVERY DAY     ACCU-CHEK AVIVA PLUS test strip  Generic drug:  glucose blood  USE AS DIRECTED TO CHECK BLOOD GLUCOSE     ACCU-CHEK SOFTCLIX LANCETS lancets  Use as directed to check blood glucose at least 3 times a day. E11.09     amLODipine 10 MG tablet  Commonly known as:  NORVASC  Take 1 tablet by mouth  daily     aspirin 81 MG tablet  Take 81 mg by mouth every morning.     azithromycin 250 MG tablet  Commonly known as:  ZITHROMAX  Take 2 tablets by mouth on day 1, take 1 tablet by mouth each day after for 4 days.     fluticasone 50 MCG/ACT nasal spray  Commonly known as:  FLONASE  Place 2 sprays into both nostrils daily.     metFORMIN 500 MG tablet  Commonly known as:  GLUCOPHAGE  Take 1 tablet (500 mg total) by mouth daily with breakfast.     simvastatin 40 MG tablet  Commonly  known as:  ZOCOR  Take 1 tablet by mouth  every evening     SYSTANE OP  Apply 1-2 drops to eye every morning.     Vitamin D 2000 units Caps  Take 2,000 Units by mouth daily.        Please HPI for pertinent positives, otherwise complete 10 system ROS negative.  Mallampati Score: MD Evaluation Airway: WNL Heart: WNL Abdomen: WNL Chest/ Lungs: WNL ASA  Classification: 3  Physical Exam: BP 150/70 mmHg  Pulse 86  Temp(Src) 98.7 F (37.1 C) (Oral)  Resp 16  Ht 5' 4.5" (1.638 m)  Wt 175 lb (79.379 kg)  BMI 29.59 kg/m2  SpO2 100% Body mass index is 29.59 kg/(m^2). General: pleasant, WD, WN black female who is laying in bed in NAD HEENT: head is normocephalic, atraumatic.  Sclera are noninjected.  PERRL.  Ears and nose without any masses or lesions.  Mouth is pink and moist Heart: regular, rate, and rhythm.  Normal s1,s2. No  obvious murmurs, gallops, or rubs noted.  Palpable radial and pedal pulses bilaterally Lungs: CTAB, no wheezes, rhonchi, or rales noted.  Respiratory effort nonlabored Abd: soft, NT, ND, +BS, no masses, hernias, or organomegaly MS: all 4 extremities are symmetrical with no cyanosis, clubbing, or edema. Psych: A&Ox3 with an appropriate affect.   Labs: Results for orders placed or performed during the hospital encounter of 01/18/16 (from the past 48 hour(s))  Glucose, capillary     Status: Abnormal   Collection Time: 01/18/16  9:32 AM  Result Value Ref Range   Glucose-Capillary 102 (H) 65 - 99 mg/dL    Imaging: No results found.  Assessment/Plan 1. Left renal mass, c/w RCC -will plan for cryoablation today of this left-sided renal mass -her labs and vitals have all been reviewed and are within normal limits -her nor her family have any further questions -the procedure including risks and complications were discussed with the patient in her office consultation with Dr. Kathlene Cote.  She is agreeable to proceed today.  Thank you for this interesting consult.  I greatly enjoyed meeting Jenny Madden and look forward to participating in their care.  A copy of this report was sent to the requesting provider on this date.  Electronically Signed: Henreitta Cea 01/18/2016, 10:51 AM   I spent a total of    25 Minutes in face to face in clinical consultation, greater than 50% of which was counseling/coordinating care for left renal mass

## 2016-01-18 NOTE — Procedures (Signed)
Interventional Radiology Procedure Note  Procedure:  CT guided cryoablation of left renal mass  Anesthesia:  General  Complications:  None  Estimated Blood Loss: < 10 mL  Left renal mass localized; approximately 1.6 cm by CT. Cryoablation performed after hydrodissection with diluted contrast in saline of 90 mL volume. 10 min freeze cycles x 2 via [employer] CX probe.  No complications.  Plan:  PACU recovery followed by overnight observation.  Venetia Night. Kathlene Cote, M.D Pager:  [phone]

## 2016-01-18 NOTE — Transfer of Care (Signed)
Immediate Anesthesia Transfer of Care Note  Patient: Jenny Madden  Procedure(s) Performed: Procedure(s): RENAL CYRO ABLATION (Left)  Patient Location: PACU  Anesthesia Type:General  Level of Consciousness: awake, alert  and oriented  Airway & Oxygen Therapy: Patient Spontanous Breathing and Patient connected to face mask oxygen  Post-op Assessment: Report given to RN and Post -op Vital signs reviewed and stable  Post vital signs: Reviewed and stable  Last Vitals:  Filed Vitals:   01/18/16 0934  BP: 150/70  Pulse: 86  Temp: 37.1 C  Resp: 16    Complications: No apparent anesthesia complications

## 2016-01-18 NOTE — Anesthesia Postprocedure Evaluation (Signed)
Anesthesia Post Note  Patient: Jenny Madden  Procedure(s) Performed: Procedure(s) (LRB): RENAL CYRO ABLATION (Left)  Patient location during evaluation: PACU Anesthesia Type: General Level of consciousness: awake and alert Pain management: pain level controlled Vital Signs Assessment: post-procedure vital signs reviewed and stable Respiratory status: spontaneous breathing, nonlabored ventilation, respiratory function stable and patient connected to nasal cannula oxygen Cardiovascular status: blood pressure returned to baseline and stable Postop Assessment: no signs of nausea or vomiting Anesthetic complications: no    Last Vitals:  Filed Vitals:   01/18/16 1500 01/18/16 1515  BP: 149/79 148/72  Pulse: 85 72  Temp:    Resp: 16 18    Last Pain:  Filed Vitals:   01/18/16 1519  PainSc: 0-No pain                 Zenaida Deed

## 2016-01-18 NOTE — Anesthesia Preprocedure Evaluation (Addendum)
Anesthesia Evaluation  Patient identified by MRN, date of birth, ID band Patient awake    Reviewed: Allergy & Precautions, NPO status , Patient's Chart, lab work & pertinent test results  History of Anesthesia Complications (+) Family history of anesthesia reaction and history of anesthetic complications (prolonged emergence in family member)  Airway Mallampati: II  TM Distance: >3 FB Neck ROM: Full    Dental  (+) Teeth Intact, Dental Advisory Given   Pulmonary neg pulmonary ROS, former smoker,    Pulmonary exam normal breath sounds clear to auscultation       Cardiovascular hypertension, Pt. on medications Normal cardiovascular exam Rhythm:Regular Rate:Normal     Neuro/Psych negative neurological ROS     GI/Hepatic Neg liver ROS, GERD  ,Schatzki's ring   Endo/Other  diabetes, Type 2, Oral Hypoglycemic Agents  Renal/GU 1.6 cm left renal mass      Musculoskeletal  (+) Arthritis ,   Abdominal   Peds  Hematology negative hematology ROS (+)   Anesthesia Other Findings Day of surgery medications reviewed with the patient.  Reproductive/Obstetrics                            Anesthesia Physical Anesthesia Plan  ASA: III  Anesthesia Plan: General   Post-op Pain Management:    Induction: Intravenous  Airway Management Planned: Oral ETT  Additional Equipment:   Intra-op Plan:   Post-operative Plan: Extubation in OR  Informed Consent: I have reviewed the patients History and Physical, chart, labs and discussed the procedure including the risks, benefits and alternatives for the proposed anesthesia with the patient or authorized representative who has indicated his/her understanding and acceptance.   Dental advisory given  Plan Discussed with: CRNA  Anesthesia Plan Comments: (Risks/benefits of general anesthesia discussed with patient including risk of damage to teeth, lips, gum, and  tongue, nausea/vomiting, allergic reactions to medications, and the possibility of heart attack, stroke and death.  All patient questions answered.  Patient wishes to proceed.)        Anesthesia Quick Evaluation

## 2016-01-18 NOTE — Anesthesia Procedure Notes (Signed)
Procedure Name: Intubation Date/Time: 01/18/2016 12:35 PM Performed by: Sharlette Dense Pre-anesthesia Checklist: Patient identified, Emergency Drugs available, Suction available and Patient being monitored Patient Re-evaluated:Patient Re-evaluated prior to inductionOxygen Delivery Method: Circle System Utilized Preoxygenation: Pre-oxygenation with 100% oxygen Intubation Type: IV induction Ventilation: Mask ventilation without difficulty Laryngoscope Size: Mac and 4 Grade View: Grade II Tube type: Oral Tube size: 7.5 mm Number of attempts: 1 Airway Equipment and Method: Stylet Placement Confirmation: ETT inserted through vocal cords under direct vision,  positive ETCO2 and breath sounds checked- equal and bilateral Secured at: 22 cm Tube secured with: Tape Dental Injury: Teeth and Oropharynx as per pre-operative assessment

## 2016-01-19 ENCOUNTER — Telehealth: Payer: Self-pay | Admitting: *Deleted

## 2016-01-19 DIAGNOSIS — I10 Essential (primary) hypertension: Secondary | ICD-10-CM | POA: Diagnosis not present

## 2016-01-19 DIAGNOSIS — E119 Type 2 diabetes mellitus without complications: Secondary | ICD-10-CM | POA: Diagnosis not present

## 2016-01-19 DIAGNOSIS — Z7984 Long term (current) use of oral hypoglycemic drugs: Secondary | ICD-10-CM | POA: Diagnosis not present

## 2016-01-19 DIAGNOSIS — D49512 Neoplasm of unspecified behavior of left kidney: Secondary | ICD-10-CM | POA: Diagnosis not present

## 2016-01-19 DIAGNOSIS — Z7982 Long term (current) use of aspirin: Secondary | ICD-10-CM | POA: Diagnosis not present

## 2016-01-19 DIAGNOSIS — N2889 Other specified disorders of kidney and ureter: Secondary | ICD-10-CM | POA: Diagnosis not present

## 2016-01-19 DIAGNOSIS — E7800 Pure hypercholesterolemia, unspecified: Secondary | ICD-10-CM | POA: Diagnosis not present

## 2016-01-19 DIAGNOSIS — Z800 Family history of malignant neoplasm of digestive organs: Secondary | ICD-10-CM | POA: Diagnosis not present

## 2016-01-19 DIAGNOSIS — J309 Allergic rhinitis, unspecified: Secondary | ICD-10-CM | POA: Diagnosis not present

## 2016-01-19 DIAGNOSIS — Z79899 Other long term (current) drug therapy: Secondary | ICD-10-CM | POA: Diagnosis not present

## 2016-01-19 LAB — CBC
HCT: 34.8 % — ABNORMAL LOW (ref 36.0–46.0)
Hemoglobin: 11.6 g/dL — ABNORMAL LOW (ref 12.0–15.0)
MCH: 28.2 pg (ref 26.0–34.0)
MCHC: 33.3 g/dL (ref 30.0–36.0)
MCV: 84.7 fL (ref 78.0–100.0)
PLATELETS: 220 10*3/uL (ref 150–400)
RBC: 4.11 MIL/uL (ref 3.87–5.11)
RDW: 13.1 % (ref 11.5–15.5)
WBC: 8.5 10*3/uL (ref 4.0–10.5)

## 2016-01-19 LAB — BASIC METABOLIC PANEL
ANION GAP: 8 (ref 5–15)
BUN: 10 mg/dL (ref 6–20)
CALCIUM: 9 mg/dL (ref 8.9–10.3)
CO2: 25 mmol/L (ref 22–32)
CREATININE: 0.89 mg/dL (ref 0.44–1.00)
Chloride: 107 mmol/L (ref 101–111)
GFR calc Af Amer: >60 mL/min (ref >60)
GLUCOSE: 90 mg/dL (ref 65–99)
Potassium: 3.9 mmol/L (ref 3.5–5.1)
Sodium: 140 mmol/L (ref 135–145)

## 2016-01-19 MED ORDER — HYDROCODONE-ACETAMINOPHEN 5-325 MG PO TABS: 1.0000 | ORAL_TABLET | ORAL | Status: DC | PRN

## 2016-01-19 MED ORDER — DOCUSATE SODIUM 100 MG PO CAPS: 100.0000 mg | ORAL_CAPSULE | Freq: Two times a day (BID) | ORAL | Status: DC

## 2016-01-19 NOTE — Progress Notes (Signed)
Pt discharged home with spouse in stable condition. Discharge instructions and scripts given. Pt verbalized understanding 

## 2016-01-19 NOTE — Care Management Obs Status (Signed)
[place] NOTIFICATION   Patient Details  Name: Jenny Madden MRN: [id number] Date of Birth: 04-13-1941   Medicare Observation Status Notification Given:  Yes    Lynnell Catalan, RN 01/19/2016, 11:28 AM

## 2016-01-19 NOTE — Discharge Instructions (Signed)
Cryoablation, Care After °Refer to this sheet in the next few weeks. These instructions provide you with information on caring for yourself after your procedure. Your health care provider may also give you more specific instructions. Your treatment has been planned according to current medical practices, but problems sometimes occur. Call your health care provider if you have any problems or questions after your procedure.  °WHAT TO EXPECT AFTER THE PROCEDURE °After your procedure, it is typical to have the following: °· Soreness around your puncture sites for 3-5 days. Some minor bruising may also be present. You will be given pain medicines to control the pain. °· Mild abdominal, flank, or right shoulder pain for 24 hours. °HOME CARE INSTRUCTIONS °· Only take over-the-counter or prescription medicines as directed by your health care provider. Take all medicines exactly as directed. °· Follow any prescribed diet. °· Follow your health care provider's instructions regarding rest and physical activity. You should be able to go back to your normal level of activity within several days of the procedure. °SEEK MEDICAL CARE IF: °· You cannot pass gas. °· You are not able to have a bowel movement within 2 days. °· You have sickness in your stomach (nausea) or vomiting. °· You have redness or drainage from any of your puncture sites. °SEEK IMMEDIATE MEDICAL CARE IF: °· You have severe or lasting abdominal pain or pain in your shoulder or back.   °· You have a fever.   °· You have a skin rash.   °· You have trouble swallowing or breathing.   °· You have severe weakness or dizziness. °· You have chest pain or shortness of breath.   °  °This information is not intended to replace advice given to you by your health care provider. Make sure you discuss any questions you have with your health care provider. °  °Document Released: 07/15/2013 Document Reviewed: 07/15/2013 °Elsevier Interactive Patient Education ©2016 [employer]  Inc. ° °

## 2016-01-19 NOTE — Telephone Encounter (Signed)
Pt was on TCM list admitted for (L) renal Lesion. She underwent cryoablation of (L) renal mass. Will f/u with specialist Dr. Laural Roes in 2 wks....Johny Chess

## 2016-01-19 NOTE — Discharge Summary (Signed)
   Patient ID: [name] MRN: [id number] DOB/AGE: 14-Nov-1940 75 y.o.  Admit date: 01/18/2016 Discharge date: 01/19/2016  Supervising Physician: Markus Daft  Admission Diagnoses: left renal lesion  Discharge Diagnoses:  Active Problems:   Left renal mass   Discharged Condition: good  Hospital Course: the patient was admitted and underwent a cryoablation of the left renal mass.  She tolerated this well.  On POD 1, she had no pain, was tolerating a regular diet, and voiding well.  She was stable for dc home.  Consults: None   Discharge Exam: Blood pressure 96/63, pulse 74, temperature 97.7 F (36.5 C), temperature source Oral, resp. rate 17, height 5' 4.5" (1.638 m), weight 175 lb (79.379 kg), SpO2 96 %. General: NAD, alert Skin: site on left back is c/d/i  No evidence of infection.  Minimal tenderness at the site.  Disposition: 01-Home or Self Care     Medication List    STOP taking these medications        azithromycin 250 MG tablet  Commonly known as:  ZITHROMAX      TAKE these medications        ACCU-CHEK AVIVA device  Use to check blood sugars daily Dx E11.9     ACCU-CHEK AVIVA PLUS w/Device Kit  USE  TO CHECK BLOOD SUGAR EVERY DAY     ACCU-CHEK AVIVA PLUS test strip  Generic drug:  glucose blood  USE AS DIRECTED TO CHECK BLOOD GLUCOSE     ACCU-CHEK SOFTCLIX LANCETS lancets  Use as directed to check blood glucose at least 3 times a day. E11.09     amLODipine 10 MG tablet  Commonly known as:  NORVASC  Take 1 tablet by mouth  daily     aspirin 81 MG tablet  Take 81 mg by mouth every morning.     docusate sodium 100 MG capsule  Commonly known as:  COLACE  Take 1 capsule (100 mg total) by mouth 2 (two) times daily.     fluticasone 50 MCG/ACT nasal spray  Commonly known as:  FLONASE  Place 2 sprays into both nostrils daily.     HYDROcodone-acetaminophen 5-325 MG tablet  Commonly known as:  NORCO/VICODIN  Take 1-2 tablets by mouth every 4  (four) hours as needed for moderate pain.     metFORMIN 500 MG tablet  Commonly known as:  GLUCOPHAGE  Take 1 tablet (500 mg total) by mouth daily with breakfast.     simvastatin 40 MG tablet  Commonly known as:  ZOCOR  Take 1 tablet by mouth  every evening     SYSTANE OP  Apply 1-2 drops to eye every morning.     Vitamin D 2000 units Caps  Take 2,000 Units by mouth daily.           Follow-up Information    Follow up with [facility] T, MD In 3 weeks.   Specialty:  Interventional Radiology   Why:  our office will call you   Contact information:   [address] [place] [address] [phone]        Electronically Signed: Henreitta Cea 01/19/2016, 10:50 AM   I have spent Less Than 30 Minutes discharging Jenny Madden.

## 2016-01-23 ENCOUNTER — Other Ambulatory Visit: Payer: Self-pay | Admitting: *Deleted

## 2016-01-23 DIAGNOSIS — N2889 Other specified disorders of kidney and ureter: Secondary | ICD-10-CM

## 2016-01-31 ENCOUNTER — Encounter: Payer: Self-pay | Admitting: Internal Medicine

## 2016-01-31 ENCOUNTER — Ambulatory Visit (INDEPENDENT_AMBULATORY_CARE_PROVIDER_SITE_OTHER): Payer: Commercial Managed Care - HMO | Admitting: Internal Medicine

## 2016-01-31 VITALS — BP 110/66 | HR 78 | Temp 98.8°F | Resp 14 | Ht 64.5 in | Wt 174.0 lb

## 2016-01-31 DIAGNOSIS — E119 Type 2 diabetes mellitus without complications: Secondary | ICD-10-CM

## 2016-01-31 DIAGNOSIS — I10 Essential (primary) hypertension: Secondary | ICD-10-CM | POA: Diagnosis not present

## 2016-01-31 MED ORDER — LISINOPRIL 10 MG PO TABS: 10.0000 mg | ORAL_TABLET | Freq: Every day | ORAL | Status: DC

## 2016-01-31 NOTE — Progress Notes (Signed)
Pre visit review using our clinic review tool, if applicable. No additional management support is needed unless otherwise documented below in the visit note. 

## 2016-01-31 NOTE — Assessment & Plan Note (Signed)
Taking metformin 500 mg daily and added lisinopril 10 mg daily. Not complicated.

## 2016-01-31 NOTE — Assessment & Plan Note (Signed)
Will change amlodipine to lisinopril 10 mg daily. Given her diabetes it is a better choice and can possible ease some of her ankle swelling.

## 2016-01-31 NOTE — Patient Instructions (Signed)
We will send in lisinopril 10 mg to take daily for blood pressure. You can stop taking the amlodipine to see if this helps with your ankle swelling. If you want you can keep taking it until you run out then start the new medicine.   Your labs look good and we do not need any blood work today.

## 2016-01-31 NOTE — Progress Notes (Signed)
   Subjective:    Patient ID: Jenny Madden, female    DOB: Oct 13, 1940, 75 y.o.   MRN: [id number]  HPI The patient is a 75 YO female coming in for follow up on her diabetes. She switched metformin to daily only per our advice after last blood draw. She is doing well and no new concerns. She would like to switch her BP med as we talked about the fact that it could be worsening her foot swelling. This is bothering her more now and she is ready to switch. Recent ablation on her left kidney for a small spot and is following up about that.   Review of Systems  Constitutional: Negative for fever, activity change, appetite change and fatigue.  HENT: Negative.   Eyes: Negative.   Respiratory: Negative for cough, chest tightness, shortness of breath and wheezing.   Cardiovascular: Negative for chest pain, palpitations and leg swelling.  Gastrointestinal: Negative for abdominal pain, diarrhea, constipation and abdominal distention.  Musculoskeletal: Negative for myalgias, arthralgias and gait problem.  Skin: Negative.   Neurological: Negative for dizziness, weakness, light-headedness and headaches.  Psychiatric/Behavioral: Negative.       Objective:   Physical Exam  Constitutional: She is oriented to person, place, and time. She appears well-developed and well-nourished.  HENT:  Head: Normocephalic and atraumatic.  Right Ear: External ear normal.  Left Ear: External ear normal.  Eyes: EOM are normal.  Neck: Normal range of motion.  Cardiovascular: Normal rate and regular rhythm.   Pulmonary/Chest: Effort normal and breath sounds normal.  Abdominal: Soft. Bowel sounds are normal.  Musculoskeletal:  Swelling in the ankles 1+  Neurological: She is alert and oriented to person, place, and time. Coordination normal.  Skin: Skin is warm and dry.   Filed Vitals:   01/31/16 1044  BP: 110/66  Pulse: 78  Temp: 98.8 F (37.1 C)  TempSrc: Oral  Resp: 14  Height: 5' 4.5" (1.638 m)  Weight:  174 lb (78.926 kg)  SpO2: 97%      Assessment & Plan:

## 2016-02-02 ENCOUNTER — Other Ambulatory Visit (HOSPITAL_COMMUNITY): Payer: Self-pay | Admitting: Interventional Radiology

## 2016-02-02 DIAGNOSIS — N2889 Other specified disorders of kidney and ureter: Secondary | ICD-10-CM | POA: Diagnosis not present

## 2016-02-02 LAB — COMPLETE METABOLIC PANEL WITH GFR
ALT: 12 U/L (ref 6–29)
AST: 20 U/L (ref 10–35)
Albumin: 4.2 g/dL (ref 3.6–5.1)
Alkaline Phosphatase: 70 U/L (ref 33–130)
BUN: 13 mg/dL (ref 7–25)
CHLORIDE: 108 mmol/L (ref 98–110)
CO2: 22 mmol/L (ref 20–31)
Calcium: 9.6 mg/dL (ref 8.6–10.4)
Creat: 1.02 mg/dL — ABNORMAL HIGH (ref 0.60–0.93)
GFR, Est African American: 62 mL/min (ref >=60)
GFR, Est Non African American: 54 mL/min — ABNORMAL LOW (ref >=60)
GLUCOSE: 83 mg/dL (ref 65–99)
POTASSIUM: 4.3 mmol/L (ref 3.5–5.3)
SODIUM: 140 mmol/L (ref 135–146)
Total Bilirubin: 0.3 mg/dL (ref 0.2–1.2)
Total Protein: 7 g/dL (ref 6.1–8.1)

## 2016-02-09 ENCOUNTER — Ambulatory Visit
Admission: RE | Admit: 2016-02-09 | Discharge: 2016-02-09 | Disposition: A | Discharge status: Home / Self Care | Payer: Commercial Managed Care - HMO | Source: Ambulatory Visit | Attending: General Surgery | Admitting: General Surgery

## 2016-02-09 DIAGNOSIS — N2889 Other specified disorders of kidney and ureter: Secondary | ICD-10-CM

## 2016-02-09 HISTORY — PX: IR GENERIC HISTORICAL: IMG1180011

## 2016-02-09 NOTE — Progress Notes (Signed)
Chief Complaint: Follow-up after percutaneous cryoablation of a left renal tumor on 01/18/2016.  History of Present Illness: Jenny Madden is a 75 y.o. female status post percutaneous cryoablation of a roughly 1.6 cm medial left mid renal tumor. She has done very well postoperatively with no pain or urinary symptoms. She is traveling to the beach this weekend for vacation.   Past Medical History  Diagnosis Date  . Allergic rhinitis, cause unspecified   . Unspecified essential hypertension   . Nonspecific abnormal electrocardiogram (ECG) (EKG)   . Nonspecific (abnormal) findings on radiological and other examination of other intrathoracic organs   . Pure hypercholesterolemia   . Type II or unspecified type diabetes mellitus without mention of complication, not stated as uncontrolled   . Other dysphagia   . Diverticulosis of colon (without mention of hemorrhage)   . Benign neoplasm of colon   . Family history of malignant neoplasm of gastrointestinal tract   . Unspecified tinnitus years ago  . Heart murmur   . Anxiety state, unspecified     none recent  . Family history of adverse reaction to anesthesia 3 yrs ago    slow to awaken     Past Surgical History  Procedure Laterality Date  . Cesarean section      x 1  . Foot surgery      bilateral for hammer toes  . Colonscopy and endoscopy  5 yrs ago    Allergies: Review of patient's allergies indicates no known allergies.  Medications: Prior to Admission medications   Medication Sig Start Date End Date Taking? Authorizing Provider  ACCU-CHEK AVIVA PLUS test strip USE AS DIRECTED TO CHECK BLOOD GLUCOSE 01/06/16   Hoyt Koch, MD  ACCU-CHEK SOFTCLIX LANCETS lancets Use as directed to check blood glucose at least 3 times a day. E11.09 12/23/14   Hoyt Koch, MD  aspirin 81 MG tablet Take 81 mg by mouth every morning.     Historical Provider, MD  Blood Glucose Monitoring Suppl (ACCU-CHEK AVIVA PLUS)  w/Device KIT USE  TO CHECK BLOOD SUGAR EVERY DAY 01/06/16   Hoyt Koch, MD  Cholecalciferol (VITAMIN D) 2000 UNITS CAPS Take 2,000 Units by mouth daily.     Historical Provider, MD  docusate sodium (COLACE) 100 MG capsule Take 1 capsule (100 mg total) by mouth 2 (two) times daily. 01/19/16   Saverio Danker, PA-C  fluticasone (FLONASE) 50 MCG/ACT nasal spray Place 2 sprays into both nostrils daily. Patient taking differently: Place 2 sprays into both nostrils daily as needed for allergies.  05/03/15   Hoyt Koch, MD  lisinopril (PRINIVIL,ZESTRIL) 10 MG tablet Take 1 tablet (10 mg total) by mouth daily. 01/31/16   Hoyt Koch, MD  metFORMIN (GLUCOPHAGE) 500 MG tablet Take 1 tablet (500 mg total) by mouth daily with breakfast. 11/11/15   Hoyt Koch, MD  Polyethyl Glycol-Propyl Glycol (SYSTANE OP) Apply 1-2 drops to eye every morning.    Historical Provider, MD  simvastatin (ZOCOR) 40 MG tablet Take 1 tablet by mouth  every evening 11/10/15   Hoyt Koch, MD     Family History  Problem Relation Age of Onset  . Stroke Father   . Lung cancer Father   . Coronary artery disease Father   . Hypertension Mother   . Heart attack Mother   . Colon cancer Brother 61    Social History   Social History  . Marital Status: Married    Spouse  Name: bob x 37 yrs  . Number of Children: 3  . Years of Education: N/A   Occupational History  . cook   .     Social History Main Topics  . Smoking status: Former Smoker    Types: Cigarettes    Quit date: 10/09/1999  . Smokeless tobacco: Never Used  . Alcohol Use: Yes     Comment: social use  . Drug Use: No  . Sexual Activity: No   Other Topics Concern  . Not on file   Social History Narrative   Married to husband, [name] x38yr   3 children - 1 son w/ Hodgkin's, 2 daughters alive and well   High school education    Review of Systems: A 12 point ROS discussed and pertinent positives are indicated in the HPI  above.  All other systems are negative.  Review of Systems  Constitutional: Negative.   Respiratory: Negative.   Cardiovascular: Negative.   Gastrointestinal: Negative.   Musculoskeletal: Negative.   Neurological: Negative.     Vital Signs: BP 114/61 mmHg  Pulse 86  Temp(Src) 98.4 F (36.9 C) (Oral)  Resp 14  SpO2 99%  Physical Exam  Constitutional: She is oriented to person, place, and time. She appears well-developed and well-nourished. No distress.  Abdominal: Soft. She exhibits no distension. There is no tenderness. There is no rebound and no guarding.  Neurological: She is alert and oriented to person, place, and time.  Skin: She is not diaphoretic.  Nursing note and vitals reviewed.    Imaging: Ct Guide Tissue Ablation  01/18/2016  CLINICAL DATA:  Left renal neoplasm. The patient presents for percutaneous cryoablation to treat the mass. EXAM: CT-GUIDED PERCUTANEOUS CRYOABLATION OF LEFT RENAL MASS ANESTHESIA/SEDATION: General MEDICATIONS: 2 g IV Ancef. As antibiotic prophylaxis, Ancef was ordered pre-procedure and administered intravenously within one hour of incision. CONTRAST:  None PROCEDURE: The procedure, risks, benefits, and alternatives were explained to the patient. Questions regarding the procedure were encouraged and answered. The patient understands and consents to the procedure. A time-out was performed prior to initiating the procedure. The patient was placed under general anesthesia. Initial unenhanced CT was performed in a prone position to localize the left renal mass. The left flank region was prepped with chlorhexidine in a sterile fashion, and a sterile drape was applied covering the operative field. A sterile gown and sterile gloves were used for the procedure. Under CT guidance, a [employer] percutaneous cryoablation probe was advanced into the left renal mass. Probe positioning was confirmed by CT prior to cryoablation. Prior to cryoablation, a dilute  mixture of 250 mL saline and 5 mL of Isovue 300 contrast was made. Hydrodissection was performed with the dilute contrast mixture via a 22 gauge spinal needle advanced into the retroperitoneum between the left kidney and the psoas muscle. Hydrodissection was performed with injection of 90 mL of this fluid mixture. Cryoablation was performed through the single cryoablation probe. Initial 10 minute cycle of cryoablation was performed. This was followed by a 6 minute thaw cycle. A second 9 minute cycle of cryoablation was then performed. During ablation, periodic CT imaging was performed to monitor ice ball formation and morphology. After active thaw, the cryoablation probe was removed. COMPLICATIONS: None FINDINGS: Initial unenhanced imaging shows a predominantly exophytic medial left mid renal mass measuring approximately 1.6 cm. The medial border of this mass lies very close to the superior margin of the iliopsoas muscle. Hydrodissection was performed prior to cryoablation to try  to limit any postprocedural pain or neuropathy. After placement of the ablation probe, it became apparent that there was no room to perform accurate biopsy of the lesion and therefore the lesion was primarily treated. Hydrodissection was successful in creating increased distance between the lesion and the psoas muscle. During the procedure imaging demonstrates formation of an adequate ice ball encompassing the lesion. There is no CT evidence of bleeding complication. IMPRESSION: CT guided percutaneous cryoablation of left renal mass. The patient will be observed overnight. Initial follow-up will be performed in approximately 4 weeks. Electronically Signed   By: Aletta Edouard M.D.   On: 01/18/2016 16:26    Labs:  CBC:  Recent Labs  10/01/15 1549 01/12/16 1055 01/19/16 0341  WBC 6.8 6.1 8.5  HGB 12.9 12.8 11.6*  HCT 41.7 40.8 34.8*  PLT 272 243 220    COAGS:  Recent Labs  01/12/16 1055  INR 1.16  APTT 27     BMP:  Recent Labs  10/01/15 1549 01/12/16 1055 01/19/16 0341 02/02/16 1042  NA 142 143 140 140  K 4.3 4.3 3.9 4.3  CL 108 109 107 108  CO2 '23 26 25 22  ' GLUCOSE 110* 92 90 83  BUN '11 13 10 13  ' CALCIUM 9.8 9.5 9.0 9.6  CREATININE 0.89 0.89 0.89 1.02*  GFRNONAA >60 >60 >60 54*  GFRAA >60 >60 >60 62    LIVER FUNCTION TESTS:  Recent Labs  07/13/15 0851 10/01/15 1549 02/02/16 1042  BILITOT 0.3 0.5 0.3  AST '17 23 20  ' ALT '13 16 12  ' ALKPHOS 63 74 70  PROT 7.7 7.9 7.0  ALBUMIN 4.3 4.3 4.2    Assessment and Plan:  Jenny Madden is doing quite well after percutaneous ablation of a left renal neoplasm 4 weeks ago. Renal function is stable. I have recommended that we perform follow-up CT of the abdomen in July, 3 months post ablation. She is agreeable to this plan.   Electronically SignedAletta Edouard T 02/09/2016, 10:55 AM   I spent a total of 15 Minutes in face to face in clinical consultation, greater than 50% of which was counseling/coordinating care post cryoablation of a left renal neoplasm.

## 2016-02-20 ENCOUNTER — Other Ambulatory Visit: Payer: Self-pay | Admitting: Internal Medicine

## 2016-03-13 ENCOUNTER — Other Ambulatory Visit: Payer: Self-pay | Admitting: Internal Medicine

## 2016-03-13 MED ORDER — ACCU-CHEK SOFTCLIX LANCETS MISC: Status: DC

## 2016-03-13 MED ORDER — GLUCOSE BLOOD VI STRP: ORAL_STRIP | Status: DC

## 2016-03-28 ENCOUNTER — Encounter: Payer: Self-pay | Admitting: Gastroenterology

## 2016-03-28 ENCOUNTER — Telehealth: Payer: Self-pay | Admitting: Internal Medicine

## 2016-03-28 ENCOUNTER — Other Ambulatory Visit (HOSPITAL_COMMUNITY): Payer: Self-pay | Admitting: Interventional Radiology

## 2016-03-28 ENCOUNTER — Other Ambulatory Visit: Payer: Self-pay | Admitting: *Deleted

## 2016-03-28 DIAGNOSIS — N2889 Other specified disorders of kidney and ureter: Secondary | ICD-10-CM

## 2016-03-28 NOTE — Telephone Encounter (Signed)
done

## 2016-03-28 NOTE — Telephone Encounter (Signed)
Patient states that ashe needs a new referral to GI- Dr Ardis Hughs. Procedure is scheduled for 05/18/2016 preop 05/04/2016

## 2016-04-16 ENCOUNTER — Telehealth: Payer: Self-pay | Admitting: Emergency Medicine

## 2016-04-16 DIAGNOSIS — K5900 Constipation, unspecified: Secondary | ICD-10-CM

## 2016-04-16 NOTE — Telephone Encounter (Signed)
Patient needs a referral to see Gastro. Dr Ardis Hughs. Please follow up thanks.

## 2016-04-17 MED ORDER — AMLODIPINE BESYLATE 10 MG PO TABS: 10.0000 mg | ORAL_TABLET | Freq: Every day | ORAL | Status: DC

## 2016-04-17 NOTE — Telephone Encounter (Signed)
Sent in amlodipine. GI referral placed.

## 2016-04-17 NOTE — Telephone Encounter (Signed)
Left msg on triage Monday afternoon stating the Lisinopril is causing her to swell she is wanting [place] back on the Amlodipine that she was on  Before...Johny Chess

## 2016-04-17 NOTE — Telephone Encounter (Signed)
Called pt no [facility] MD sent amlodipine to [facility], and referral has been place to see GI doctor...Johny Chess

## 2016-04-25 DIAGNOSIS — N2889 Other specified disorders of kidney and ureter: Secondary | ICD-10-CM | POA: Diagnosis not present

## 2016-04-25 DIAGNOSIS — N289 Disorder of kidney and ureter, unspecified: Secondary | ICD-10-CM | POA: Diagnosis not present

## 2016-04-26 LAB — BUN: BUN: 14 mg/dL (ref 7–25)

## 2016-04-26 LAB — CREATININE WITH EST GFR
CREATININE: 1.06 mg/dL — AB (ref 0.60–0.93)
GFR, EST AFRICAN AMERICAN: 59 mL/min — AB (ref >=60)
GFR, Est Non African American: 51 mL/min — ABNORMAL LOW (ref >=60)

## 2016-05-01 ENCOUNTER — Ambulatory Visit (HOSPITAL_COMMUNITY)
Admission: RE | Admit: 2016-05-01 | Discharge: 2016-05-01 | Disposition: A | Discharge status: Home / Self Care | Payer: Commercial Managed Care - HMO | Source: Ambulatory Visit | Attending: Interventional Radiology | Admitting: Interventional Radiology

## 2016-05-01 ENCOUNTER — Encounter (HOSPITAL_COMMUNITY): Payer: Self-pay

## 2016-05-01 DIAGNOSIS — K760 Fatty (change of) liver, not elsewhere classified: Secondary | ICD-10-CM | POA: Diagnosis not present

## 2016-05-01 DIAGNOSIS — N2889 Other specified disorders of kidney and ureter: Secondary | ICD-10-CM

## 2016-05-01 DIAGNOSIS — I700 Atherosclerosis of aorta: Secondary | ICD-10-CM | POA: Diagnosis not present

## 2016-05-01 DIAGNOSIS — N281 Cyst of kidney, acquired: Secondary | ICD-10-CM | POA: Diagnosis not present

## 2016-05-01 DIAGNOSIS — Z9889 Other specified postprocedural states: Secondary | ICD-10-CM | POA: Insufficient documentation

## 2016-05-01 MED ORDER — IOPAMIDOL (ISOVUE-300) INJECTION 61%
100.0000 mL | Freq: Once | INTRAVENOUS | Status: AC | PRN
  Administered 2016-05-01: 100 mL via INTRAVENOUS

## 2016-05-03 ENCOUNTER — Ambulatory Visit
Admission: RE | Admit: 2016-05-03 | Discharge: 2016-05-03 | Disposition: A | Discharge status: Home / Self Care | Payer: Commercial Managed Care - HMO | Source: Ambulatory Visit | Attending: Interventional Radiology | Admitting: Interventional Radiology

## 2016-05-03 DIAGNOSIS — D4112 Neoplasm of uncertain behavior of left renal pelvis: Secondary | ICD-10-CM | POA: Diagnosis not present

## 2016-05-03 DIAGNOSIS — N2889 Other specified disorders of kidney and ureter: Secondary | ICD-10-CM

## 2016-05-03 HISTORY — PX: IR GENERIC HISTORICAL: IMG1180011

## 2016-05-03 NOTE — Progress Notes (Signed)
Chief Complaint: Status post cryoablation of a left renal mass on 01/18/2016.  History of Present Illness: Jenny Madden is a 75 y.o. female status post percutaneous CT-guided cryoablation of a left renal neoplasm 3 months ago. Due to relatively small size of the neoplasm at the time of treatment, biopsy could not be performed at the time of cryoablation. She has done well after the procedure with no complaints. She has no urinary symptoms or abdominal pain.  Past Medical History:  Diagnosis Date  . Allergic rhinitis, cause unspecified   . Anxiety state, unspecified    none recent  . Benign neoplasm of colon   . Diverticulosis of colon (without mention of hemorrhage)   . Family history of adverse reaction to anesthesia 3 yrs ago   slow to awaken   . Family history of malignant neoplasm of gastrointestinal tract   . Heart murmur   . Nonspecific (abnormal) findings on radiological and other examination of other intrathoracic organs   . Nonspecific abnormal electrocardiogram (ECG) (EKG)   . Other dysphagia   . Pure hypercholesterolemia   . Type II or unspecified type diabetes mellitus without mention of complication, not stated as uncontrolled   . Unspecified essential hypertension   . Unspecified tinnitus years ago    Past Surgical History:  Procedure Laterality Date  . CESAREAN SECTION     x 1  . colonscopy and endoscopy  5 yrs ago  . FOOT SURGERY     bilateral for hammer toes    Allergies: Review of patient's allergies indicates no known allergies.  Medications: Prior to Admission medications   Medication Sig Start Date End Date Taking? Authorizing Provider  ACCU-CHEK SOFTCLIX LANCETS lancets Use as directed to check blood glucose at least 3 times a day. E11.09 03/13/16  Yes Hoyt Koch, MD  amLODipine (NORVASC) 10 MG tablet Take 1 tablet (10 mg total) by mouth daily. 04/17/16  Yes Hoyt Koch, MD  aspirin 81 MG tablet Take 81 mg by mouth every  morning.    Yes Historical Provider, MD  Blood Glucose Monitoring Suppl (ACCU-CHEK AVIVA PLUS) w/Device KIT Use as directed to check blood sugars daily Dx e11.9 03/13/16  Yes Hoyt Koch, MD  Cholecalciferol (VITAMIN D) 2000 UNITS CAPS Take 2,000 Units by mouth daily.    Yes Historical Provider, MD  fluticasone (FLONASE) 50 MCG/ACT nasal spray Place 2 sprays into both nostrils daily. Patient taking differently: Place 2 sprays into both nostrils daily as needed for allergies.  05/03/15  Yes Hoyt Koch, MD  glucose blood (ACCU-CHEK AVIVA PLUS) test strip Use to check blood sugars twice a day Dx E11.9 03/13/16  Yes Hoyt Koch, MD  metFORMIN (GLUCOPHAGE) 500 MG tablet Take 1 tablet (500 mg total) by mouth daily with breakfast. 11/11/15  Yes Hoyt Koch, MD  Polyethyl Glycol-Propyl Glycol (SYSTANE OP) Apply 1-2 drops to eye every morning.   Yes Historical Provider, MD  simvastatin (ZOCOR) 40 MG tablet Take 1 tablet by mouth  every evening 11/10/15  Yes Hoyt Koch, MD  docusate sodium (COLACE) 100 MG capsule Take 1 capsule (100 mg total) by mouth 2 (two) times daily. Patient not taking: Reported on 05/03/2016 01/19/16   Saverio Danker, PA-C     Family History  Problem Relation Age of Onset  . Stroke Father   . Lung cancer Father   . Coronary artery disease Father   . Hypertension Mother   . Heart attack Mother   .  Colon cancer Brother 78    Social History   Social History  . Marital status: Married    Spouse name: bob x 37 yrs  . Number of children: 3  . Years of education: N/A   Occupational History  . cook   .  Retired   Social History Main Topics  . Smoking status: Former Smoker    Types: Cigarettes    Quit date: 10/09/1999  . Smokeless tobacco: Never Used  . Alcohol use Yes     Comment: social use  . Drug use: No  . Sexual activity: No   Other Topics Concern  . Not on file   Social History Narrative   Married to husband, [name] x24yr    3 children - 1 son w/ Hodgkin's, 2 daughters alive and well   High school education    Review of Systems: A 12 point ROS discussed and pertinent positives are indicated in the HPI above.  All other systems are negative.  Review of Systems  Constitutional: Negative.   HENT: Negative.   Respiratory: Negative.   Cardiovascular: Negative.   Gastrointestinal: Negative.   Genitourinary: Negative.   Musculoskeletal: Negative.   Neurological: Negative.     Vital Signs: BP 118/73 (BP Location: Left Arm, Patient Position: Sitting, Cuff Size: Normal)   Pulse 92   Temp 98.8 F (37.1 C) (Oral)   Resp 14   Ht 5' 3.75" (1.619 m)   Wt 174 lb (78.9 kg)   SpO2 99%   BMI 30.10 kg/m   Physical Exam  Constitutional: She is oriented to person, place, and time.  Abdominal: Soft. She exhibits no distension. There is no tenderness. There is no rebound and no guarding.  Neurological: She is alert and oriented to person, place, and time.    Imaging: Ct Abdomen W Wo Contrast  Result Date: 05/01/2016 CLINICAL DATA:  Restaging left renal lesion status post cryoablation. EXAM: CT ABDOMEN WITHOUT AND WITH CONTRAST TECHNIQUE: Multidetector CT imaging of the abdomen was performed following the standard protocol before and following the bolus administration of intravenous contrast. CONTRAST:  1090mISOVUE-300 IOPAMIDOL (ISOVUE-300) INJECTION 61% COMPARISON:  MRI 11/01/2015 and CT scan 10/01/2015 FINDINGS: Lower chest: The lung bases are clear of acute process. Left basilar scarring changes. No worrisome pulmonary lesions. The heart is normal in size. No pericardial effusion. Extensive coronary artery calcifications are noted. Mitral valve annular calcifications are noted. Hepatobiliary: Mild diffuse fatty infiltration of the liver but no focal hepatic lesions or intrahepatic biliary dilatation. The gallbladder is normal. No common bile duct dilatation. Pancreas: No mass, inflammation or ductal dilatation. Spleen:  Normal size.  No focal lesions. Adrenals/Urinary Tract: The adrenal glands are normal. The right kidney is unremarkable and stable. No renal lesions, renal calculi or hydronephrosis. The left kidney demonstrates expected changes from recent cryoablation procedure and April 2017. No CT findings for residual or recurrent tumor. Stable large upper pole simple left renal cyst. No collecting system abnormalities on the delayed images. Stomach/Bowel: The stomach, duodenum, small bowel and colon grossly normal without oral contrast. Moderate descending colon diverticulosis. Vascular/Lymphatic: Advanced atherosclerotic calcifications involving the aorta and branch vessels. No aneurysm or dissection. No mesenteric or retroperitoneal mass or adenopathy. Small scattered lymph nodes are noted. Other: No abdominal wall hernia or subcutaneous lesions. Musculoskeletal: No significant bony findings. IMPRESSION: 1. Expected cryoablation changes involving the left kidney. No findings for residual or recurrent tumor. Recommend continued routine surveillance. 2. Stable simple upper pole left renal cyst. The right  kidney is normal and stable. 3. No findings for abdominal metastatic disease. 4. Advanced atherosclerotic calcifications involving the aorta and branch vessels. 5. Mild diffuse fatty infiltration of the liver. Electronically Signed   By: Marijo Sanes M.D.   On: 05/01/2016 10:54   Labs:  CBC:  Recent Labs  10/01/15 1549 01/12/16 1055 01/19/16 0341  WBC 6.8 6.1 8.5  HGB 12.9 12.8 11.6*  HCT 41.7 40.8 34.8*  PLT 272 243 220    COAGS:  Recent Labs  01/12/16 1055  INR 1.16  APTT 27    BMP:  Recent Labs  10/01/15 1549 01/12/16 1055 01/19/16 0341 02/02/16 1042 04/25/16 1038  NA 142 143 140 140  --   K 4.3 4.3 3.9 4.3  --   CL 108 109 107 108  --   CO2 '23 26 25 22  ' --   GLUCOSE 110* 92 90 83  --   BUN '11 13 10 13 14  ' CALCIUM 9.8 9.5 9.0 9.6  --   CREATININE 0.89 0.89 0.89 1.02* 1.06*    GFRNONAA >60 >60 >60 54* 51*  GFRAA >60 >60 >60 62 59*    LIVER FUNCTION TESTS:  Recent Labs  07/13/15 0851 10/01/15 1549 02/02/16 1042  BILITOT 0.3 0.5 0.3  AST '17 23 20  ' ALT '13 16 12  ' ALKPHOS 63 74 70  PROT 7.7 7.9 7.0  ALBUMIN 4.3 4.3 4.2     Assessment and Plan:  Renal function is stable and within normal limits. I met with Jenny Madden and reviewed the follow-up CT performed 2 days ago on 05/01/2016. This demonstrates an ablation defect at the level of the treated neoplasm with no evidence of abnormal enhancement. There is no evidence of application or additional renal lesions. I have recommended follow-up imaging next April, 12 months after ablation.   Electronically SignedAletta Edouard T 05/03/2016, 9:03 AM   I spent a total of15 Minutes in face to face in clinical consultation, greater than 50% of which was counseling/coordinating care post cryoablation of a left renal neoplasm.

## 2016-05-04 ENCOUNTER — Ambulatory Visit (AMBULATORY_SURGERY_CENTER): Payer: Self-pay

## 2016-05-04 VITALS — Ht 64.75 in | Wt 175.0 lb

## 2016-05-04 DIAGNOSIS — R1310 Dysphagia, unspecified: Secondary | ICD-10-CM

## 2016-05-04 NOTE — Progress Notes (Signed)
No egg or soy allergy. Hx of Post op N/V x1 No home O2. No diet meds.

## 2016-05-07 ENCOUNTER — Encounter: Payer: Self-pay | Admitting: Gastroenterology

## 2016-05-18 ENCOUNTER — Encounter: Payer: Self-pay | Admitting: Family

## 2016-05-18 ENCOUNTER — Ambulatory Visit (INDEPENDENT_AMBULATORY_CARE_PROVIDER_SITE_OTHER): Payer: Commercial Managed Care - HMO | Admitting: Family

## 2016-05-18 ENCOUNTER — Encounter: Payer: Commercial Managed Care - HMO | Admitting: Gastroenterology

## 2016-05-18 VITALS — BP 142/80 | HR 93 | Temp 98.4°F | Resp 16 | Ht 64.75 in | Wt 176.0 lb

## 2016-05-18 DIAGNOSIS — H9313 Tinnitus, bilateral: Secondary | ICD-10-CM

## 2016-05-18 DIAGNOSIS — N2889 Other specified disorders of kidney and ureter: Secondary | ICD-10-CM

## 2016-05-18 DIAGNOSIS — H9319 Tinnitus, unspecified ear: Secondary | ICD-10-CM | POA: Insufficient documentation

## 2016-05-18 NOTE — Progress Notes (Signed)
Subjective:    Patient ID: Jenny Madden, female    DOB: [birth year], 75 y.o.   MRN: [id number]  Chief Complaint  Patient presents with  . Tinnitus    having ringing in her ears for a while     HPI:  Jenny Madden is a 75 y.o. female who  has a past medical history of Allergic rhinitis, cause unspecified; Anxiety state, unspecified; Benign neoplasm of colon; Chronic kidney disease (01/2016); Diverticulosis of colon (without mention of hemorrhage); Family history of adverse reaction to anesthesia (3 yrs ago); Family history of malignant neoplasm of gastrointestinal tract; Heart murmur; Hypertension; Nonspecific (abnormal) findings on radiological and other examination of other intrathoracic organs; Nonspecific abnormal electrocardiogram (ECG) (EKG); Other dysphagia; Pure hypercholesterolemia; Type II or unspecified type diabetes mellitus without mention of complication, not stated as uncontrolled; Unspecified essential hypertension; and Unspecified tinnitus (years ago). and presents today for an office visit.   This is an exacerbation of a previous problem. Associated symptom of ringing in her ears has been going on for about 1 month . Described as a hissing type sound.  There is no dizziness or changes in hearing. She has had an MRI recently but does not recall if she was having symptoms at the time. Described as a continuous sound with no pulsatile quality. Sounds are in both hears. No headaches, fevers, or cold/flu like symptoms. Denies any changes in hearing. Denies modifying factors or attempted treatments that make it better.    No Known Allergies   Current Outpatient Prescriptions on File Prior to Visit  Medication Sig Dispense Refill  . ACCU-CHEK SOFTCLIX LANCETS lancets Use as directed to check blood glucose at least 3 times a day. E11.09 200 each 3  . amLODipine (NORVASC) 10 MG tablet Take 1 tablet (10 mg total) by mouth daily. 90 tablet 3  . aspirin 81 MG tablet Take 81 mg  by mouth every morning.     . Blood Glucose Monitoring Suppl (ACCU-CHEK AVIVA PLUS) w/Device KIT Use as directed to check blood sugars daily Dx e11.9 1 kit 0  . Cholecalciferol (VITAMIN D) 2000 UNITS CAPS Take 2,000 Units by mouth daily.     . fluticasone (FLONASE) 50 MCG/ACT nasal spray Place 2 sprays into both nostrils daily. (Patient taking differently: Place 2 sprays into both nostrils daily as needed for allergies. ) 16 g 11  . glucose blood (ACCU-CHEK AVIVA PLUS) test strip Use to check blood sugars twice a day Dx E11.9 200 each 3  . metFORMIN (GLUCOPHAGE) 500 MG tablet Take 1 tablet (500 mg total) by mouth daily with breakfast. 180 tablet 3  . Polyethyl Glycol-Propyl Glycol (SYSTANE OP) Apply 1-2 drops to eye every morning.    . simvastatin (ZOCOR) 40 MG tablet Take 1 tablet by mouth  every evening 90 tablet 3  . [DISCONTINUED] Alum & Mag Hydroxide-Simeth (MAGIC MOUTHWASH) SOLN 1 teaspoon gargle and swallow four times a day      No current facility-administered medications on file prior to visit.     Past Medical History:  Diagnosis Date  . Allergic rhinitis, cause unspecified   . Anxiety state, unspecified    none recent  . Benign neoplasm of colon   . Chronic kidney disease 01/2016   Tumor left side  . Diverticulosis of colon (without mention of hemorrhage)   . Family history of adverse reaction to anesthesia 3 yrs ago   slow to awaken   . Family history of malignant neoplasm of gastrointestinal  tract   . Heart murmur   . Hypertension   . Nonspecific (abnormal) findings on radiological and other examination of other intrathoracic organs   . Nonspecific abnormal electrocardiogram (ECG) (EKG)   . Other dysphagia   . Pure hypercholesterolemia   . Type II or unspecified type diabetes mellitus without mention of complication, not stated as uncontrolled   . Unspecified essential hypertension   . Unspecified tinnitus years ago     Review of Systems  Constitutional: Negative  for chills and fever.  HENT: Negative for ear discharge and ear pain.   Respiratory: Negative for chest tightness and shortness of breath.       Objective:    BP (!) 142/80 (BP Location: Left Arm, Patient Position: Sitting, Cuff Size: Normal)   Pulse 93   Temp 98.4 F (36.9 C) (Oral)   Resp 16   Ht 5' 4.75" (1.645 m)   Wt 176 lb (79.8 kg)   SpO2 98%   BMI 29.51 kg/m  Nursing note and vital signs reviewed.  Physical Exam  Constitutional: She is oriented to person, place, and time. She appears well-developed and well-nourished. No distress.  HENT:  Right Ear: Hearing, tympanic membrane, external ear and ear canal normal. No lacerations. No drainage, swelling or tenderness. No foreign bodies. Tympanic membrane is not injected and not scarred. No middle ear effusion. No decreased hearing is noted.  Left Ear: Hearing, tympanic membrane and ear canal normal. No lacerations. No drainage, swelling or tenderness. No foreign bodies. Tympanic membrane is not injected and not scarred.  No middle ear effusion. No decreased hearing is noted.  Cardiovascular: Normal rate, regular rhythm, normal heart sounds and intact distal pulses.   Pulmonary/Chest: Effort normal and breath sounds normal.  Neurological: She is alert and oriented to person, place, and time.  Skin: Skin is warm and dry.  Psychiatric: She has a normal mood and affect. Her behavior is normal. Judgment and thought content normal.       Assessment & Plan:   Problem List Items Addressed This Visit      Other   Ringing in ears - Primary    Ringing in ears with no significant findings during physical exam with normal neurological exam. Recommend treatment with second generation antihistamine or nasal corticosteroid if symptoms are from eustachian tube dysfunction. Referral sent to ENT for audiometry and possible hearing test.       Relevant Orders   Ambulatory referral to ENT    Other Visit Diagnoses    Renal mass        Relevant Orders   Ambulatory referral to Urology      I am having Ms. Mutch maintain her aspirin, Vitamin D, fluticasone, simvastatin, metFORMIN, Polyethyl Glycol-Propyl Glycol (SYSTANE OP), ACCU-CHEK AVIVA PLUS, glucose blood, ACCU-CHEK SOFTCLIX LANCETS, and amLODipine.   Follow-up: Return if symptoms worsen or fail to improve.  Mauricio Po, FNP

## 2016-05-18 NOTE — Patient Instructions (Signed)
Thank you for choosing [facility].  Summary/Instructions:  Please try Allegra and Flonase to help decrease congestion.  They will call to schedule an ENT appointment.   If your symptoms worsen or fail to improve, please contact our office for further instruction, or in case of emergency go directly to the emergency room at the closest medical facility.   Tinnitus Tinnitus refers to hearing a sound when there is no actual source for that sound. This is often described as ringing in the ears. However, people with this condition may hear a variety of noises. A person may hear the sound in one ear or in both ears.  The sounds of tinnitus can be soft, loud, or somewhere in between. Tinnitus can last for a few seconds or can be constant for days. It may go away without treatment and come back at various times. When tinnitus is constant or happens often, it can lead to other problems, such as trouble sleeping and trouble concentrating. Almost everyone experiences tinnitus at some point. Tinnitus that is long-lasting (chronic) or comes back often is a problem that may require medical attention.  CAUSES  The cause of tinnitus is often not known. In some cases, it can result from other problems or conditions, including:   Exposure to loud noises from machinery, music, or other sources.  Hearing loss.  Ear or sinus infections.  Earwax buildup.  A foreign object in the ear.  Use of certain medicines.  Use of alcohol and caffeine.  High blood pressure.  Heart diseases.  Anemia.  Allergies.  Meniere disease.  Thyroid problems.  Tumors.  An enlarged part of a weakened blood vessel (aneurysm). SYMPTOMS The main symptom of tinnitus is hearing a sound when there is no source for that sound. It may sound like:   Buzzing.  Roaring.  Ringing.  Blowing air, similar to the sound heard when you listen to a seashell.  Hissing.  Whistling.  Sizzling.  Humming.  Running  water.  A sustained musical note. DIAGNOSIS  Tinnitus is diagnosed based on your symptoms. Your health care provider will do a physical exam. A comprehensive hearing exam (audiologic exam) will be done if your tinnitus:   Affects only one ear (unilateral).  Causes hearing difficulties.  Lasts 6 months or longer. You may also need to see a health care provider who specializes in hearing disorders (audiologist). You may be asked to complete a questionnaire to determine the severity of your tinnitus. Tests may be done to help determine the cause and to rule out other conditions. These can include:  Imaging studies of your head and brain, such as:  A CT scan.  An MRI.  An imaging study of your blood vessels (angiogram). TREATMENT  Treating an underlying medical condition can sometimes make tinnitus go away. If your tinnitus continues, other treatments may include:  Medicines, such as certain antidepressants or sleeping aids.  Sound generators to mask the tinnitus. These include:  Tabletop sound machines that play relaxing sounds to help you fall asleep.  Wearable devices that fit in your ear and play sounds or music.  A small device that uses headphones to deliver a signal embedded in music (acoustic neural stimulation). In time, this may change the pathways of your brain and make you less sensitive to tinnitus. This device is used for very severe cases when no other treatment is working.  Therapy and counseling to help you manage the stress of living with tinnitus.  Using hearing aids or  cochlear implants, if your tinnitus is related to hearing loss. HOME CARE INSTRUCTIONS  When possible, avoid being in loud places and being exposed to loud sounds.  Wear hearing protection, such as earplugs, when you are exposed to loud noises.  Do not take stimulants, such as nicotine, alcohol, or caffeine.  Practice techniques for reducing stress, such as meditation, yoga, or deep  breathing.  Use a white noise machine, a humidifier, or other devices to mask the sound of tinnitus.  Sleep with your head slightly raised. This may reduce the impact of tinnitus.  Try to get plenty of rest each night. SEEK MEDICAL CARE IF:  You have tinnitus in just one ear.  Your tinnitus continues for 3 weeks or longer without stopping.  Home care measures are not helping.  You have tinnitus after a head injury.  You have tinnitus along with any of the following:  Dizziness.  Loss of balance.  Nausea and vomiting.   This information is not intended to replace advice given to you by your health care provider. Make sure you discuss any questions you have with your health care provider.   Document Released: 09/24/2005 Document Revised: 10/15/2014 Document Reviewed: 02/24/2014 Elsevier Interactive Patient Education [employer].

## 2016-05-18 NOTE — Assessment & Plan Note (Signed)
Ringing in ears with no significant findings during physical exam with normal neurological exam. Recommend treatment with second generation antihistamine or nasal corticosteroid if symptoms are from eustachian tube dysfunction. Referral sent to ENT for audiometry and possible hearing test.

## 2016-05-21 ENCOUNTER — Telehealth: Payer: Self-pay | Admitting: Internal Medicine

## 2016-05-21 DIAGNOSIS — E119 Type 2 diabetes mellitus without complications: Secondary | ICD-10-CM

## 2016-05-21 NOTE — Telephone Encounter (Signed)
Patient sent in a mychart message requesting a referral for an opthalmology exam

## 2016-05-22 NOTE — Telephone Encounter (Signed)
Patient says she goes to [facility]. Please refer there.

## 2016-05-22 NOTE — Telephone Encounter (Signed)
Does she have an eye doctor or has she seen one in the past couple of years? Have placed referral (if she has seen a doctor before we need to let referral know so they can send the right place).

## 2016-05-30 ENCOUNTER — Telehealth: Payer: Self-pay | Admitting: Internal Medicine

## 2016-05-30 DIAGNOSIS — D49512 Neoplasm of unspecified behavior of left kidney: Secondary | ICD-10-CM | POA: Diagnosis not present

## 2016-05-30 NOTE — Telephone Encounter (Signed)
done

## 2016-05-30 NOTE — Telephone Encounter (Signed)
Patient went to [facility] for lab work today, and they told her that she does not have a referral on file. Please communicate the referral noted on our system to them for correct billing.   I also gave the # to the patient to present herself at her next appt next week with them

## 2016-06-06 DIAGNOSIS — D49512 Neoplasm of unspecified behavior of left kidney: Secondary | ICD-10-CM | POA: Diagnosis not present

## 2016-06-12 DIAGNOSIS — H35033 Hypertensive retinopathy, bilateral: Secondary | ICD-10-CM | POA: Diagnosis not present

## 2016-06-12 DIAGNOSIS — H40023 Open angle with borderline findings, high risk, bilateral: Secondary | ICD-10-CM | POA: Diagnosis not present

## 2016-06-12 DIAGNOSIS — H25013 Cortical age-related cataract, bilateral: Secondary | ICD-10-CM | POA: Diagnosis not present

## 2016-06-12 DIAGNOSIS — H2513 Age-related nuclear cataract, bilateral: Secondary | ICD-10-CM | POA: Diagnosis not present

## 2016-06-12 DIAGNOSIS — E119 Type 2 diabetes mellitus without complications: Secondary | ICD-10-CM | POA: Diagnosis not present

## 2016-06-12 LAB — HM DIABETES EYE EXAM

## 2016-06-15 ENCOUNTER — Encounter: Payer: Self-pay | Admitting: Internal Medicine

## 2016-06-20 DIAGNOSIS — H9313 Tinnitus, bilateral: Secondary | ICD-10-CM | POA: Diagnosis not present

## 2016-06-20 DIAGNOSIS — J310 Chronic rhinitis: Secondary | ICD-10-CM | POA: Diagnosis not present

## 2016-06-21 ENCOUNTER — Ambulatory Visit (INDEPENDENT_AMBULATORY_CARE_PROVIDER_SITE_OTHER): Payer: Commercial Managed Care - HMO

## 2016-06-21 DIAGNOSIS — Z23 Encounter for immunization: Secondary | ICD-10-CM

## 2016-07-06 ENCOUNTER — Encounter: Payer: Self-pay | Admitting: Interventional Radiology

## 2016-07-06 ENCOUNTER — Other Ambulatory Visit: Payer: Self-pay | Admitting: Internal Medicine

## 2016-07-06 DIAGNOSIS — Z1231 Encounter for screening mammogram for malignant neoplasm of breast: Secondary | ICD-10-CM

## 2016-07-10 ENCOUNTER — Encounter: Payer: Self-pay | Admitting: General Surgery

## 2016-07-25 ENCOUNTER — Ambulatory Visit (INDEPENDENT_AMBULATORY_CARE_PROVIDER_SITE_OTHER): Payer: Commercial Managed Care - HMO | Admitting: Gynecology

## 2016-07-25 ENCOUNTER — Encounter: Payer: Self-pay | Admitting: Gynecology

## 2016-07-25 VITALS — BP 138/70 | Ht 63.5 in | Wt 177.0 lb

## 2016-07-25 DIAGNOSIS — M8580 Other specified disorders of bone density and structure, unspecified site: Secondary | ICD-10-CM

## 2016-07-25 DIAGNOSIS — Z01411 Encounter for gynecological examination (general) (routine) with abnormal findings: Secondary | ICD-10-CM | POA: Diagnosis not present

## 2016-07-25 DIAGNOSIS — N898 Other specified noninflammatory disorders of vagina: Secondary | ICD-10-CM

## 2016-07-25 NOTE — Progress Notes (Signed)
Jenny Madden 04-Mar-1941 [id number]   History:    75 y.o.  for annual gyn exam with no complaints today. Patient with history of osteopenia. Patient's PCP is been doing her blood work and treating for hypertension, hyperlipidemia and type 2 diabetes.Her recent vitamin D level was normal as well.It appears that patient did have esophageal stricture history has not been able to tolerate oral bisphosphonates in the past. She would be an ideal candidate for Reclast IV once a year. She states that she is taking vitamin D 2000 units daily. She has a past history of colon polyps and her last colonoscopy was this year along with an EGD which patient states was normal. Her last Pap smear was in April 2012.  Patient's last bone density study was in 2015 once again indicating osteopenia but she had a normal Frax analysis.  Past medical history,surgical history, family history and social history were all reviewed and documented in the EPIC chart.  Gynecologic History No LMP recorded. Patient is postmenopausal. Contraception: post menopausal status Last Pap: 2016. Results were: normal Last mammogram: 2016. Results were: normal  Obstetric History OB History  Gravida Para Term Preterm AB Living  3 1 1   2 1   SAB TAB Ectopic Multiple Live Births          1    # Outcome Date GA Lbr Len/2nd Weight Sex Delivery Anes PTL Lv  3 AB           2 AB           1 Term     F CS-Unspec  N LIV       ROS: A ROS was performed and pertinent positives and negatives are included in the history.  GENERAL: No fevers or chills. HEENT: No change in vision, no earache, sore throat or sinus congestion. NECK: No pain or stiffness. CARDIOVASCULAR: No chest pain or pressure. No palpitations. PULMONARY: No shortness of breath, cough or wheeze. GASTROINTESTINAL: No abdominal pain, nausea, vomiting or diarrhea, melena or bright red blood per rectum. GENITOURINARY: No urinary frequency, urgency, hesitancy or dysuria.  MUSCULOSKELETAL: No joint or muscle pain, no back pain, no recent trauma. DERMATOLOGIC: No rash, no itching, no lesions. ENDOCRINE: No polyuria, polydipsia, no heat or cold intolerance. No recent change in weight. HEMATOLOGICAL: No anemia or easy bruising or bleeding. NEUROLOGIC: No headache, seizures, numbness, tingling or weakness. PSYCHIATRIC: No depression, no loss of interest in normal activity or change in sleep pattern.     Exam: chaperone present  BP 138/70   Ht 5' 3.5" (1.613 m)   Wt 177 lb (80.3 kg)   BMI 30.86 kg/m   Body mass index is 30.86 kg/m.  General appearance : Well developed well nourished female. No acute distress HEENT: Eyes: no retinal hemorrhage or exudates,  Neck supple, trachea midline, no carotid bruits, no thyroidmegaly Lungs: Clear to auscultation, no rhonchi or wheezes, or rib retractions  Heart: Regular rate and rhythm, no murmurs or gallops Breast:Examined in sitting and supine position were symmetrical in appearance, no palpable masses or tenderness,  no skin retraction, no nipple inversion, no nipple discharge, no skin discoloration, no axillary or supraclavicular lymphadenopathy Abdomen: no palpable masses or tenderness, no rebound or guarding Extremities: no edema or skin discoloration or tenderness  Pelvic:  Bartholin, Urethra, Skene Glands: Within normal limits             Vagina: No gross lesions or discharge, atrophic changes  Cervix: No gross  lesions or discharge  Uterus  anteverted, normal size, shape and consistency, non-tender and mobile  Adnexa  Without masses or tenderness  Anus and perineum  normal   Rectovaginal  normal sphincter tone without palpated masses or tenderness             Hemoccult PCP provides     Assessment/Plan:  75 y.o. female for annual exam with past history of colon polyps and brother with colon cancer she is on a 5 year recall her next colonoscopy will be due in 2018. Pap smear is no longer indicated. Her PCP we'll  be scheduling her upcoming bone density study and she also needs her mammogram. Her PCP is been doing her blood work and her vaccines. We discussed once again the importance of calcium vitamin D and weightbearing exercises for osteoporosis prevention.     Terrance Mass MD, 11:48 AM 07/25/2016

## 2016-07-25 NOTE — Patient Instructions (Signed)

## 2016-08-01 ENCOUNTER — Encounter: Payer: Self-pay | Admitting: Internal Medicine

## 2016-08-01 ENCOUNTER — Ambulatory Visit (INDEPENDENT_AMBULATORY_CARE_PROVIDER_SITE_OTHER): Payer: Commercial Managed Care - HMO | Admitting: Internal Medicine

## 2016-08-01 ENCOUNTER — Other Ambulatory Visit (INDEPENDENT_AMBULATORY_CARE_PROVIDER_SITE_OTHER): Payer: Commercial Managed Care - HMO

## 2016-08-01 VITALS — BP 122/70 | HR 87 | Temp 98.1°F | Resp 12 | Ht 63.0 in | Wt 176.0 lb

## 2016-08-01 DIAGNOSIS — E119 Type 2 diabetes mellitus without complications: Secondary | ICD-10-CM

## 2016-08-01 DIAGNOSIS — Z0000 Encounter for general adult medical examination without abnormal findings: Secondary | ICD-10-CM

## 2016-08-01 LAB — COMPREHENSIVE METABOLIC PANEL
ALBUMIN: 4.4 g/dL (ref 3.5–5.2)
ALK PHOS: 66 U/L (ref 39–117)
ALT: 14 U/L (ref 0–35)
AST: 20 U/L (ref 0–37)
BILIRUBIN TOTAL: 0.4 mg/dL (ref 0.2–1.2)
BUN: 12 mg/dL (ref 6–23)
CO2: 27 mEq/L (ref 19–32)
CREATININE: 0.98 mg/dL (ref 0.40–1.20)
Calcium: 10.2 mg/dL (ref 8.4–10.5)
Chloride: 106 mEq/L (ref 96–112)
GFR: 70.97 mL/min (ref >60.00)
Glucose, Bld: 114 mg/dL — ABNORMAL HIGH (ref 70–99)
POTASSIUM: 4 meq/L (ref 3.5–5.1)
SODIUM: 142 meq/L (ref 135–145)
TOTAL PROTEIN: 7.8 g/dL (ref 6.0–8.3)

## 2016-08-01 LAB — LIPID PANEL
CHOLESTEROL: 162 mg/dL (ref 0–200)
HDL: 53.4 mg/dL (ref >39.00)
LDL Cholesterol: 94 mg/dL (ref 0–99)
NonHDL: 108.99
Total CHOL/HDL Ratio: 3
Triglycerides: 76 mg/dL (ref 0.0–149.0)
VLDL: 15.2 mg/dL (ref 0.0–40.0)

## 2016-08-01 LAB — HEMOGLOBIN A1C: HEMOGLOBIN A1C: 6.4 % (ref 4.6–6.5)

## 2016-08-01 LAB — MICROALBUMIN / CREATININE URINE RATIO
CREATININE, U: 239.7 mg/dL
MICROALB UR: 1.6 mg/dL (ref 0.0–1.9)
MICROALB/CREAT RATIO: 0.7 mg/g (ref 0.0–30.0)

## 2016-08-01 NOTE — Progress Notes (Signed)
   Subjective:    Patient ID: Jenny Madden, female    DOB: 1941/03/07, 75 y.o.   MRN: [id number]  HPI Here for medicare wellness and physical, no new complaints. Please see A/P for status and treatment of chronic medical problems.   Diet: DM since diabetic Physical activity: sedentary Depression/mood screen: negative Hearing: intact to whispered voice, mild loss Visual acuity: grossly normal, performs annual eye exam  ADLs: capable Fall risk: none Home safety: good Cognitive evaluation: intact to orientation, naming, recall and repetition EOL planning: adv directives discussed, in place  I have personally reviewed and have noted 1. The patient's medical and social history - reviewed today no changes 2. Their use of alcohol, tobacco or illicit drugs 3. Their current medications and supplements 4. The patient's functional ability including ADL's, fall risks, home safety risks and hearing or visual impairment. 5. Diet and physical activities 6. Evidence for depression or mood disorders 7. Care team reviewed and updated (available in snapshot)  Review of Systems  Constitutional: Negative for activity change, appetite change, fatigue, fever and unexpected weight change.  HENT: Negative.   Eyes: Negative.   Respiratory: Negative for cough, chest tightness and shortness of breath.   Cardiovascular: Negative for chest pain, palpitations and leg swelling.  Gastrointestinal: Negative for abdominal distention, abdominal pain, constipation, diarrhea, nausea and vomiting.  Musculoskeletal: Positive for arthralgias. Negative for back pain, joint swelling and myalgias.  Skin: Negative.   Neurological: Negative.   Psychiatric/Behavioral: Negative.       Objective:   Physical Exam  Constitutional: She is oriented to person, place, and time. She appears well-developed and well-nourished.  HENT:  Head: Normocephalic and atraumatic.  Eyes: EOM are normal.  Neck: Normal range of motion.    Cardiovascular: Normal rate and regular rhythm.   Pulmonary/Chest: Effort normal and breath sounds normal. No respiratory distress. She has no wheezes. She has no rales.  Abdominal: Soft. Bowel sounds are normal. She exhibits no distension. There is no tenderness. There is no rebound.  Musculoskeletal: She exhibits no edema.  Neurological: She is alert and oriented to person, place, and time. Coordination normal.  Skin: Skin is warm and dry.  Foot exam done  Psychiatric: She has a normal mood and affect.   Vitals:   08/01/16 0931  BP: 122/70  Pulse: 87  Resp: 12  Temp: 98.1 F (36.7 C)  TempSrc: Oral  SpO2: 94%  Weight: 176 lb (79.8 kg)  Height: 5\' 3"  (1.6 m)      Assessment & Plan:

## 2016-08-01 NOTE — Progress Notes (Signed)
Pre visit review using our clinic review tool, if applicable. No additional management support is needed unless otherwise documented below in the visit note. 

## 2016-08-01 NOTE — Patient Instructions (Signed)
We are going to check the labs today and call you back with the results.   Keep up the good work with the health and work on doing some light exercise a couple of times per week.   Health Maintenance, Female Adopting a healthy lifestyle and getting preventive care can go a long way to promote health and wellness. Talk with your health care provider about what schedule of regular examinations is right for you. This is a good chance for you to check in with your provider about disease prevention and staying healthy. In between checkups, there are plenty of things you can do on your own. Experts have done a lot of research about which lifestyle changes and preventive measures are most likely to keep you healthy. Ask your health care provider for more information. WEIGHT AND DIET  Eat a healthy diet  Be sure to include plenty of vegetables, fruits, low-fat dairy products, and lean protein.  Do not eat a lot of foods high in solid fats, added sugars, or salt.  Get regular exercise. This is one of the most important things you can do for your health.  Most adults should exercise for at least 150 minutes each week. The exercise should increase your heart rate and make you sweat (moderate-intensity exercise).  Most adults should also do strengthening exercises at least twice a week. This is in addition to the moderate-intensity exercise.  Maintain a healthy weight  Body mass index (BMI) is a measurement that can be used to identify possible weight problems. It estimates body fat based on height and weight. Your health care provider can help determine your BMI and help you achieve or maintain a healthy weight.  For females 31 years of age and older:   A BMI below 18.5 is considered underweight.  A BMI of 18.5 to 24.9 is normal.  A BMI of 25 to 29.9 is considered overweight.  A BMI of 30 and above is considered obese.  Watch levels of cholesterol and blood lipids  You should start having  your blood tested for lipids and cholesterol at 76 years of age, then have this test every 5 years.  You may need to have your cholesterol levels checked more often if:  Your lipid or cholesterol levels are high.  You are older than 76 years of age.  You are at high risk for heart disease.  CANCER SCREENING   Lung Cancer  Lung cancer screening is recommended for adults 44-10 years old who are at high risk for lung cancer because of a history of smoking.  A yearly low-dose CT scan of the lungs is recommended for people who:  Currently smoke.  Have quit within the past 15 years.  Have at least a 30-pack-year history of smoking. A pack year is smoking an average of one pack of cigarettes a day for 1 year.  Yearly screening should continue until it has been 15 years since you quit.  Yearly screening should stop if you develop a health problem that would prevent you from having lung cancer treatment.  Breast Cancer  Practice breast self-awareness. This means understanding how your breasts normally appear and feel.  It also means doing regular breast self-exams. Let your health care provider know about any changes, no matter how small.  If you are in your 20s or 30s, you should have a clinical breast exam (CBE) by a health care provider every 1-3 years as part of a regular health exam.  If you  are 26 or older, have a CBE every year. Also consider having a breast X-ray (mammogram) every year.  If you have a family history of breast cancer, talk to your health care provider about genetic screening.  If you are at high risk for breast cancer, talk to your health care provider about having an MRI and a mammogram every year.  Breast cancer gene (BRCA) assessment is recommended for women who have family members with BRCA-related cancers. BRCA-related cancers include:  Breast.  Ovarian.  Tubal.  Peritoneal cancers.  Results of the assessment will determine the need for genetic  counseling and BRCA1 and BRCA2 testing. Cervical Cancer Your health care provider may recommend that you be screened regularly for cancer of the pelvic organs (ovaries, uterus, and vagina). This screening involves a pelvic examination, including checking for microscopic changes to the surface of your cervix (Pap test). You may be encouraged to have this screening done every 3 years, beginning at age 81.  For women ages 72-65, health care providers may recommend pelvic exams and Pap testing every 3 years, or they may recommend the Pap and pelvic exam, combined with testing for human papilloma virus (HPV), every 5 years. Some types of HPV increase your risk of cervical cancer. Testing for HPV may also be done on women of any age with unclear Pap test results.  Other health care providers may not recommend any screening for nonpregnant women who are considered low risk for pelvic cancer and who do not have symptoms. Ask your health care provider if a screening pelvic exam is right for you.  If you have had past treatment for cervical cancer or a condition that could lead to cancer, you need Pap tests and screening for cancer for at least 20 years after your treatment. If Pap tests have been discontinued, your risk factors (such as having a new sexual partner) need to be reassessed to determine if screening should resume. Some women have medical problems that increase the chance of getting cervical cancer. In these cases, your health care provider may recommend more frequent screening and Pap tests. Colorectal Cancer  This type of cancer can be detected and often prevented.  Routine colorectal cancer screening usually begins at 76 years of age and continues through 76 years of age.  Your health care provider may recommend screening at an earlier age if you have risk factors for colon cancer.  Your health care provider may also recommend using home test kits to check for hidden blood in the stool.  A  small camera at the end of a tube can be used to examine your colon directly (sigmoidoscopy or colonoscopy). This is done to check for the earliest forms of colorectal cancer.  Routine screening usually begins at age 36.  Direct examination of the colon should be repeated every 5-10 years through 76 years of age. However, you may need to be screened more often if early forms of precancerous polyps or small growths are found. Skin Cancer  Check your skin from head to toe regularly.  Tell your health care provider about any new moles or changes in moles, especially if there is a change in a mole's shape or color.  Also tell your health care provider if you have a mole that is larger than the size of a pencil eraser.  Always use sunscreen. Apply sunscreen liberally and repeatedly throughout the day.  Protect yourself by wearing long sleeves, pants, a wide-brimmed hat, and sunglasses whenever you are outside.  HEART DISEASE, DIABETES, AND HIGH BLOOD PRESSURE   High blood pressure causes heart disease and increases the risk of stroke. High blood pressure is more likely to develop in:  People who have blood pressure in the high end of the normal range (130-139/85-89 mm Hg).  People who are overweight or obese.  People who are African American.  If you are 62-67 years of age, have your blood pressure checked every 3-5 years. If you are 57 years of age or older, have your blood pressure checked every year. You should have your blood pressure measured twice--once when you are at a hospital or clinic, and once when you are not at a hospital or clinic. Record the average of the two measurements. To check your blood pressure when you are not at a hospital or clinic, you can use:  An automated blood pressure machine at a pharmacy.  A home blood pressure monitor.  If you are between 51 years and 51 years old, ask your health care provider if you should take aspirin to prevent strokes.  Have  regular diabetes screenings. This involves taking a blood sample to check your fasting blood sugar level.  If you are at a normal weight and have a low risk for diabetes, have this test once every three years after 76 years of age.  If you are overweight and have a high risk for diabetes, consider being tested at a younger age or more often. PREVENTING INFECTION  Hepatitis B  If you have a higher risk for hepatitis B, you should be screened for this virus. You are considered at high risk for hepatitis B if:  You were born in a country where hepatitis B is common. Ask your health care provider which countries are considered high risk.  Your parents were born in a high-risk country, and you have not been immunized against hepatitis B (hepatitis B vaccine).  You have HIV or AIDS.  You use needles to inject street drugs.  You live with someone who has hepatitis B.  You have had sex with someone who has hepatitis B.  You get hemodialysis treatment.  You take certain medicines for conditions, including cancer, organ transplantation, and autoimmune conditions. Hepatitis C  Blood testing is recommended for:  Everyone born from [birth year] through 1966.  Anyone with known risk factors for hepatitis C. Sexually transmitted infections (STIs)  You should be screened for sexually transmitted infections (STIs) including gonorrhea and chlamydia if:  You are sexually active and are younger than 76 years of age.  You are older than 76 years of age and your health care provider tells you that you are at risk for this type of infection.  Your sexual activity has changed since you were last screened and you are at an increased risk for chlamydia or gonorrhea. Ask your health care provider if you are at risk.  If you do not have HIV, but are at risk, it may be recommended that you take a prescription medicine daily to prevent HIV infection. This is called pre-exposure prophylaxis (PrEP). You are  considered at risk if:  You are sexually active and do not regularly use condoms or know the HIV status of your partner(s).  You take drugs by injection.  You are sexually active with a partner who has HIV. Talk with your health care provider about whether you are at high risk of being infected with HIV. If you choose to begin PrEP, you should first be tested for HIV. You  should then be tested every 3 months for as long as you are taking PrEP.  PREGNANCY   If you are premenopausal and you may become pregnant, ask your health care provider about preconception counseling.  If you may become pregnant, take 400 to 800 micrograms (mcg) of folic acid every day.  If you want to prevent pregnancy, talk to your health care provider about birth control (contraception). OSTEOPOROSIS AND MENOPAUSE   Osteoporosis is a disease in which the bones lose minerals and strength with aging. This can result in serious bone fractures. Your risk for osteoporosis can be identified using a bone density scan.  If you are 58 years of age or older, or if you are at risk for osteoporosis and fractures, ask your health care provider if you should be screened.  Ask your health care provider whether you should take a calcium or vitamin D supplement to lower your risk for osteoporosis.  Menopause may have certain physical symptoms and risks.  Hormone replacement therapy may reduce some of these symptoms and risks. Talk to your health care provider about whether hormone replacement therapy is right for you.  HOME CARE INSTRUCTIONS   Schedule regular health, dental, and eye exams.  Stay current with your immunizations.   Do not use any tobacco products including cigarettes, chewing tobacco, or electronic cigarettes.  If you are pregnant, do not drink alcohol.  If you are breastfeeding, limit how much and how often you drink alcohol.  Limit alcohol intake to no more than 1 drink per day for nonpregnant women. One  drink equals 12 ounces of beer, 5 ounces of wine, or 1 ounces of hard liquor.  Do not use street drugs.  Do not share needles.  Ask your health care provider for help if you need support or information about quitting drugs.  Tell your health care provider if you often feel depressed.  Tell your health care provider if you have ever been abused or do not feel safe at home.   This information is not intended to replace advice given to you by your health care provider. Make sure you discuss any questions you have with your health care provider.   Document Released: 04/09/2011 Document Revised: 10/15/2014 Document Reviewed: 08/26/2013 Elsevier Interactive Patient Education [employer].

## 2016-08-05 NOTE — Assessment & Plan Note (Signed)
Checking microalbumin to creatinine ratio and foot exam done. Reminded about yearly eye exam. Checking HgA1c, currently well controlled on her metformin, zocor. Not on ACE-I or ARB.

## 2016-08-05 NOTE — Assessment & Plan Note (Signed)
Foot exam done, flu shot up to date. Tetanus up to date, pneumonia and shingles done. Colonoscopy up to date. Counseled on sun safety and mole surveillance. Given 10 year screening recommendations.

## 2016-08-07 ENCOUNTER — Ambulatory Visit
Admission: RE | Admit: 2016-08-07 | Discharge: 2016-08-07 | Disposition: A | Discharge status: Home / Self Care | Payer: Commercial Managed Care - HMO | Source: Ambulatory Visit | Attending: Internal Medicine | Admitting: Internal Medicine

## 2016-08-07 DIAGNOSIS — Z1231 Encounter for screening mammogram for malignant neoplasm of breast: Secondary | ICD-10-CM | POA: Diagnosis not present

## 2016-08-24 ENCOUNTER — Other Ambulatory Visit: Payer: Self-pay | Admitting: Internal Medicine

## 2016-08-29 ENCOUNTER — Encounter: Payer: Self-pay | Admitting: Gastroenterology

## 2016-09-03 ENCOUNTER — Encounter: Payer: Self-pay | Admitting: Gastroenterology

## 2016-09-13 ENCOUNTER — Other Ambulatory Visit: Payer: Self-pay | Admitting: Internal Medicine

## 2016-10-31 ENCOUNTER — Telehealth: Payer: Self-pay | Admitting: Gastroenterology

## 2016-10-31 ENCOUNTER — Ambulatory Visit (AMBULATORY_SURGERY_CENTER): Payer: Self-pay

## 2016-10-31 ENCOUNTER — Telehealth: Payer: Self-pay

## 2016-10-31 VITALS — Ht 63.5 in | Wt 181.6 lb

## 2016-10-31 DIAGNOSIS — Z800 Family history of malignant neoplasm of digestive organs: Secondary | ICD-10-CM

## 2016-10-31 MED ORDER — SUPREP BOWEL PREP KIT 17.5-3.13-1.6 GM/177ML PO SOLN
1.0000 | Freq: Once | ORAL | 0 refills | Status: AC
Start: 2016-10-31 — End: 2016-10-31

## 2016-10-31 NOTE — Telephone Encounter (Signed)
Pt has hx of dysphagia.  Still having symptoms.  "Can't swallow sometimes"  Requested egd please.                                                                                                                                                                          Jenny Madden/PV

## 2016-10-31 NOTE — Telephone Encounter (Signed)
No more than $50 voucher used for pt and she is aware.

## 2016-10-31 NOTE — Progress Notes (Signed)
Family hx adverse reaction to anesthesia-pt denies "cyst on kidney had an ablation and it made me sick" No allergies to eggs or soy No diet meds No home oxygen  Declined emmi

## 2016-11-01 NOTE — Telephone Encounter (Signed)
Yes, she was seen in office a year ago for this. We recommended and scheduled an EGD at that time but she never had it done.   I'm happy to do this for her now, will need to reschedule her colonoscopy/EGD appointment to a day that has space for 2 procedures since 1/31 is full.    Thanks

## 2016-11-01 NOTE — Telephone Encounter (Signed)
Called pt with news that she could add the EGD but when she found out she would have to change her procedure date and time, she declined and stated, "I'll just do the colonoscopy on Wednesday; I've put it off soon enough".                                                                                                                  Sallye Lunz/PV

## 2016-11-01 NOTE — Telephone Encounter (Signed)
Ok, thanks.

## 2016-11-07 ENCOUNTER — Telehealth: Payer: Self-pay | Admitting: Gastroenterology

## 2016-11-07 ENCOUNTER — Ambulatory Visit (AMBULATORY_SURGERY_CENTER): Payer: Medicare HMO | Admitting: Gastroenterology

## 2016-11-07 ENCOUNTER — Encounter: Payer: Self-pay | Admitting: Gastroenterology

## 2016-11-07 VITALS — BP 123/70 | HR 66 | Temp 96.8°F | Resp 17 | Ht 63.0 in | Wt 181.0 lb

## 2016-11-07 DIAGNOSIS — Z800 Family history of malignant neoplasm of digestive organs: Secondary | ICD-10-CM

## 2016-11-07 DIAGNOSIS — Z1211 Encounter for screening for malignant neoplasm of colon: Secondary | ICD-10-CM

## 2016-11-07 DIAGNOSIS — R69 Illness, unspecified: Secondary | ICD-10-CM | POA: Diagnosis not present

## 2016-11-07 DIAGNOSIS — I10 Essential (primary) hypertension: Secondary | ICD-10-CM | POA: Diagnosis not present

## 2016-11-07 DIAGNOSIS — E669 Obesity, unspecified: Secondary | ICD-10-CM | POA: Diagnosis not present

## 2016-11-07 DIAGNOSIS — E119 Type 2 diabetes mellitus without complications: Secondary | ICD-10-CM | POA: Diagnosis not present

## 2016-11-07 DIAGNOSIS — Z86010 Personal history of colonic polyps: Secondary | ICD-10-CM | POA: Diagnosis not present

## 2016-11-07 DIAGNOSIS — D124 Benign neoplasm of descending colon: Secondary | ICD-10-CM

## 2016-11-07 DIAGNOSIS — D122 Benign neoplasm of ascending colon: Secondary | ICD-10-CM

## 2016-11-07 DIAGNOSIS — Z1212 Encounter for screening for malignant neoplasm of rectum: Secondary | ICD-10-CM

## 2016-11-07 MED ORDER — SODIUM CHLORIDE 0.9 % IV SOLN: 500.0000 mL | INTRAVENOUS | Status: DC

## 2016-11-07 NOTE — Patient Instructions (Signed)
YOU HAD AN ENDOSCOPIC PROCEDURE TODAY AT [facility]:   Refer to the procedure report that was given to you for any specific questions about what was found during the examination.  If the procedure report does not answer your questions, please call your gastroenterologist to clarify.  If you requested that your care partner not be given the details of your procedure findings, then the procedure report has been included in a sealed envelope for you to review at your convenience later.  YOU SHOULD EXPECT: Some feelings of bloating in the abdomen. Passage of more gas than usual.  Walking can help get rid of the air that was put into your GI tract during the procedure and reduce the bloating. If you had a lower endoscopy (such as a colonoscopy or flexible sigmoidoscopy) you may notice spotting of blood in your stool or on the toilet paper. If you underwent a bowel prep for your procedure, you may not have a normal bowel movement for a few days.  Please Note:  You might notice some irritation and congestion in your nose or some drainage.  This is from the oxygen used during your procedure.  There is no need for concern and it should clear up in a day or so.  SYMPTOMS TO REPORT IMMEDIATELY:   Following lower endoscopy (colonoscopy or flexible sigmoidoscopy):  Excessive amounts of blood in the stool  Significant tenderness or worsening of abdominal pains  Swelling of the abdomen that is new, acute  Fever of 100F or higher   Following upper endoscopy (EGD)  Vomiting of blood or coffee ground material  New chest pain or pain under the shoulder blades  Painful or persistently difficult swallowing  New shortness of breath  Fever of 100F or higher  Black, tarry-looking stools  For urgent or emergent issues, a gastroenterologist can be reached at any hour by calling [phone].   DIET:  We do recommend a small meal at first, but then you may proceed to your regular diet.  Drink  plenty of fluids but you should avoid alcoholic beverages for 24 hours.  ACTIVITY:  You should plan to take it easy for the rest of today and you should NOT DRIVE or use heavy machinery until tomorrow (because of the sedation medicines used during the test).    FOLLOW UP: Our staff will call the number listed on your records the next business day following your procedure to check on you and address any questions or concerns that you may have regarding the information given to you following your procedure. If we do not reach you, we will leave a message.  However, if you are feeling well and you are not experiencing any problems, there is no need to return our call.  We will assume that you have returned to your regular daily activities without incident.  If any biopsies were taken you will be contacted by phone or by letter within the next 1-3 weeks.  Please call us at [phone] if you have not heard about the biopsies in 3 weeks.    SIGNATURES/CONFIDENTIALITY: You and/or your care partner have signed paperwork which will be entered into your electronic medical record.  These signatures attest to the fact that that the information above on your After Visit Summary has been reviewed and is understood.  Full responsibility of the confidentiality of this discharge information lies with you and/or your care-partner.    Handout were given to pt's care partner on polyps  and diverticulosis. Your blood sugar was 93 in the recovery room. You may resume your current medications today. Await polyp results. Please call if any questions or concerns.

## 2016-11-07 NOTE — Telephone Encounter (Signed)
Oncall Note  Patient started the prep and is having some mild abdominal cramps with bowel movements, reassured patient and advised her to continue the prep  K. Denzil Magnuson , MD

## 2016-11-07 NOTE — Progress Notes (Signed)
Called to room to assist during endoscopic procedure.  Patient ID and intended procedure confirmed with present staff. Received instructions for my participation in the procedure from the performing physician.  

## 2016-11-07 NOTE — Op Note (Signed)
[facility] Patient Name: Jenny Madden Procedure Date: 11/07/2016 9:19 AM MRN: [id number] Endoscopist: Milus Banister , MD Age: 76 Referring MD:  Date of Birth: 1941/04/01 Gender: Female Account #: [id number] Procedure:                Colonoscopy Indications:              Screening in patient at increased risk: Family                            history of 1st-degree relative with colorectal                            cancer (brother) Medicines:                Monitored Anesthesia Care Procedure:                Pre-Anesthesia Assessment:                           - Prior to the procedure, a History and Physical                            was performed, and patient medications and                            allergies were reviewed. The patient's tolerance of                            previous anesthesia was also reviewed. The risks                            and benefits of the procedure and the sedation                            options and risks were discussed with the patient.                            All questions were answered, and informed consent                            was obtained. Prior Anticoagulants: The patient has                            taken no previous anticoagulant or antiplatelet                            agents. ASA Grade Assessment: II - A patient with                            mild systemic disease. After reviewing the risks                            and benefits, the patient was deemed in  satisfactory condition to undergo the procedure.                           After obtaining informed consent, the colonoscope                            was passed under direct vision. Throughout the                            procedure, the patient's blood pressure, pulse, and                            oxygen saturations were monitored continuously. The                            Model CF-HQ190L [id number]) scope was  introduced                            through the anus and advanced to the the cecum,                            identified by appendiceal orifice and ileocecal                            valve. The colonoscopy was performed without                            difficulty. The patient tolerated the procedure                            well. The quality of the bowel preparation was                            good. The ileocecal valve, appendiceal orifice, and                            rectum were photographed. Scope In: 9:23:46 AM Scope Out: 9:34:03 AM Scope Withdrawal Time: 0 hours 8 minutes 41 seconds  Total Procedure Duration: 0 hours 10 minutes 17 seconds  Findings:                 Two sessile polyps were found in the descending                            colon and ascending colon. The polyps were 5 to 8                            mm in size. These polyps were removed with a cold                            snare. Resection and retrieval were complete.                           Multiple small and large-mouthed diverticula were  found in the left colon.                           The exam was otherwise without abnormality on                            direct and retroflexion views. Complications:            No immediate complications. Estimated blood loss:                            None. Estimated Blood Loss:     Estimated blood loss: none. Impression:               - Two 5 to 8 mm polyps in the descending colon and                            in the ascending colon, removed with a cold snare.                            Resected and retrieved.                           - Diverticulosis in the left colon.                           - The examination was otherwise normal on direct                            and retroflexion views. Recommendation:           - Patient has a contact number available for                            emergencies. The signs and symptoms of  potential                            delayed complications were discussed with the                            patient. Return to normal activities tomorrow.                            Written discharge instructions were provided to the                            patient.                           - Resume previous diet.                           - Continue present medications.                           You will receive a letter within 2-3 weeks with the  pathology results and my final recommendations.                           If the polyp(s) is proven to be 'pre-cancerous' on                            pathology, you will need repeat colonoscopy in 5                            years. If the polyp(s) is NOT 'precancerous' on                            pathology then you should repeat colon cancer                            screening in 10 years with colonoscopy without need                            for colon cancer screening by any method prior to                            then (including stool testing). Milus Banister, MD 11/07/2016 9:36:19 AM This report has been signed electronically.

## 2016-11-07 NOTE — Progress Notes (Signed)
To recovery, report to Jones, RN, VSS 

## 2016-11-07 NOTE — Progress Notes (Signed)
Pt's states no medical or surgical changes since previsit or office visit. 

## 2016-11-08 ENCOUNTER — Telehealth: Payer: Self-pay | Admitting: *Deleted

## 2016-11-08 NOTE — Telephone Encounter (Signed)
  Follow up Call-  Call back number 11/07/2016  Post procedure Call Back phone  # [phone]  Permission to leave phone message Yes  Some recent data might be hidden     Patient questions:  Do you have a fever, pain , or abdominal swelling? No. Pain Score  0 *  Have you tolerated food without any problems? Yes.    Have you been able to return to your normal activities? Yes.    Do you have any questions about your discharge instructions: Diet   No. Medications  No. Follow up visit  No.  Do you have questions or concerns about your Care? No.  Actions: * If pain score is 4 or above: No action needed, pain <4.

## 2016-11-14 ENCOUNTER — Encounter: Payer: Self-pay | Admitting: Gastroenterology

## 2016-12-11 DIAGNOSIS — H40023 Open angle with borderline findings, high risk, bilateral: Secondary | ICD-10-CM | POA: Diagnosis not present

## 2016-12-11 DIAGNOSIS — H40053 Ocular hypertension, bilateral: Secondary | ICD-10-CM | POA: Diagnosis not present

## 2016-12-11 DIAGNOSIS — H04123 Dry eye syndrome of bilateral lacrimal glands: Secondary | ICD-10-CM | POA: Diagnosis not present

## 2016-12-25 ENCOUNTER — Other Ambulatory Visit: Payer: Self-pay | Admitting: *Deleted

## 2016-12-25 ENCOUNTER — Other Ambulatory Visit (HOSPITAL_COMMUNITY): Payer: Self-pay | Admitting: Interventional Radiology

## 2016-12-25 DIAGNOSIS — N2889 Other specified disorders of kidney and ureter: Secondary | ICD-10-CM

## 2017-01-01 DIAGNOSIS — N2889 Other specified disorders of kidney and ureter: Secondary | ICD-10-CM | POA: Diagnosis not present

## 2017-01-02 LAB — CREATININE WITH EST GFR
Creat: 1 mg/dL — ABNORMAL HIGH (ref 0.60–0.93)
GFR, EST AFRICAN AMERICAN: 63 mL/min (ref >=60)
GFR, EST NON AFRICAN AMERICAN: 55 mL/min — AB (ref >=60)

## 2017-01-02 LAB — BUN: BUN: 11 mg/dL (ref 7–25)

## 2017-01-08 ENCOUNTER — Ambulatory Visit (HOSPITAL_COMMUNITY)
Admission: RE | Admit: 2017-01-08 | Discharge: 2017-01-08 | Disposition: A | Discharge status: Home / Self Care | Payer: Medicare HMO | Source: Ambulatory Visit | Attending: Interventional Radiology | Admitting: Interventional Radiology

## 2017-01-08 DIAGNOSIS — K760 Fatty (change of) liver, not elsewhere classified: Secondary | ICD-10-CM | POA: Diagnosis not present

## 2017-01-08 DIAGNOSIS — N2889 Other specified disorders of kidney and ureter: Secondary | ICD-10-CM | POA: Insufficient documentation

## 2017-01-08 DIAGNOSIS — R911 Solitary pulmonary nodule: Secondary | ICD-10-CM | POA: Diagnosis not present

## 2017-01-08 DIAGNOSIS — K5730 Diverticulosis of large intestine without perforation or abscess without bleeding: Secondary | ICD-10-CM | POA: Diagnosis not present

## 2017-01-08 DIAGNOSIS — I700 Atherosclerosis of aorta: Secondary | ICD-10-CM | POA: Diagnosis not present

## 2017-01-08 MED ORDER — IOPAMIDOL (ISOVUE-300) INJECTION 61%
INTRAVENOUS | Status: AC
  Administered 2017-01-08: 100 mL
  Filled 2017-01-08: qty 100

## 2017-01-09 ENCOUNTER — Ambulatory Visit
Admission: RE | Admit: 2017-01-09 | Discharge: 2017-01-09 | Disposition: A | Discharge status: Home / Self Care | Payer: Medicare HMO | Source: Ambulatory Visit | Attending: Interventional Radiology | Admitting: Interventional Radiology

## 2017-01-09 DIAGNOSIS — N2889 Other specified disorders of kidney and ureter: Secondary | ICD-10-CM | POA: Diagnosis not present

## 2017-01-09 HISTORY — PX: IR RADIOLOGIST EVAL & MGMT: IMG5224

## 2017-01-09 NOTE — Progress Notes (Signed)
Chief Complaint: Status post cryoablation of a left renal neoplasm on 01/18/2016.   History of Present Illness: Jenny Madden is a 76 y.o. female who is now one year status post cryoablation of a medial 1.6 cm left renal mass. She has been doing well with no complaints.   Past Medical History:  Diagnosis Date  . Allergic rhinitis, cause unspecified   . Anxiety state, unspecified    none recent  . Benign neoplasm of colon   . Chronic kidney disease 01/2016   Tumor left side  . Diverticulosis of colon (without mention of hemorrhage)   . Dysphagia   . Family history of adverse reaction to anesthesia 3 yrs ago   slow to awaken   . Family history of malignant neoplasm of gastrointestinal tract   . Heart murmur   . Hypertension   . Nonspecific (abnormal) findings on radiological and other examination of other intrathoracic organs   . Nonspecific abnormal electrocardiogram (ECG) (EKG)   . Other dysphagia   . Pure hypercholesterolemia   . Type II or unspecified type diabetes mellitus without mention of complication, not stated as uncontrolled   . Unspecified essential hypertension   . Unspecified tinnitus years ago    Past Surgical History:  Procedure Laterality Date  . ABLATION Left 01/2016   Renal mass  . CESAREAN SECTION     x 1  . COLONOSCOPY    . colonscopy and endoscopy  5 yrs ago  . FOOT SURGERY     bilateral for hammer toes  . IR GENERIC HISTORICAL  12/06/2015   IR RADIOLOGIST EVAL & MGMT 12/06/2015 Aletta Edouard, MD [facility] INTERV RAD  . IR GENERIC HISTORICAL  05/03/2016   IR RADIOLOGIST EVAL & MGMT 05/03/2016 Aletta Edouard, MD [facility] INTERV RAD  . IR GENERIC HISTORICAL  02/09/2016   IR RADIOLOGIST EVAL & MGMT 02/09/2016 [facility] INTERV RAD  . UPPER GASTROINTESTINAL ENDOSCOPY      Allergies: Patient has no known allergies.  Medications: Prior to Admission medications   Medication Sig Start Date End Date Taking? Authorizing Provider  amLODipine (NORVASC) 10  MG tablet Take 1 tablet (10 mg total) by mouth daily. 04/17/16  Yes Hoyt Koch, MD  aspirin 81 MG tablet Take 81 mg by mouth every morning.    Yes Historical Provider, MD  Blood Glucose Monitoring Suppl (ACCU-CHEK AVIVA PLUS) w/Device KIT USE AS DIRECTED  TO CHECK BLOOD SUGAR EVERY DAY 08/27/16  Yes Hoyt Koch, MD  Cholecalciferol (VITAMIN D) 2000 UNITS CAPS Take 2,000 Units by mouth daily.    Yes Historical Provider, MD  fluticasone (FLONASE) 50 MCG/ACT nasal spray Place 2 sprays into both nostrils daily. 05/03/15  Yes Hoyt Koch, MD  glucose blood (ACCU-CHEK AVIVA PLUS) test strip Use to check blood sugars twice a day Dx E11.9 03/13/16  Yes Hoyt Koch, MD  metFORMIN (GLUCOPHAGE) 500 MG tablet TAKE 1 TABLET TWICE DAILY WITH MEALS 08/27/16  Yes Hoyt Koch, MD  Polyethyl Glycol-Propyl Glycol (SYSTANE OP) Apply 1-2 drops to eye every morning.   Yes Historical Provider, MD  simvastatin (ZOCOR) 40 MG tablet TAKE 1 TABLET BY MOUTH  EVERY EVENING 09/17/16  Yes Hoyt Koch, MD  ACCU-CHEK SOFTCLIX LANCETS lancets Use as directed to check blood glucose at least 3 times a day. E11.09 Patient not taking: Reported on 01/09/2017 03/13/16   Hoyt Koch, MD     Family History  Problem Relation Age of Onset  . Stroke  Father   . Lung cancer Father   . Coronary artery disease Father   . Hypertension Mother   . Heart attack Mother   . Colon cancer Brother 27    Social History   Social History  . Marital status: Married    Spouse name: bob x 37 yrs  . Number of children: 3  . Years of education: N/A   Occupational History  . cook   .  Retired   Social History Main Topics  . Smoking status: Former Smoker    Types: Cigarettes    Quit date: 10/09/1999  . Smokeless tobacco: Never Used  . Alcohol use 0.6 oz/week    1 Standard drinks or equivalent per week     Comment: social use  . Drug use: No  . Sexual activity: No   Other Topics  Concern  . Not on file   Social History Narrative   Married to husband, [name] x71yr   3 children - 1 son w/ Hodgkin's, 2 daughters alive and well   High school education     Review of Systems: A 12 point ROS discussed and pertinent positives are indicated in the HPI above.  All other systems are negative.  Review of Systems  Constitutional: Negative.   Respiratory: Negative.   Cardiovascular: Negative.   Gastrointestinal: Negative for abdominal pain, constipation, diarrhea, nausea and vomiting.       Some chronic "gas".  Genitourinary: Negative.   Musculoskeletal: Negative.   Neurological: Negative.     Vital Signs: BP 136/68 (BP Location: Left Arm, Patient Position: Sitting, Cuff Size: Normal)   Pulse 90   Temp 98.3 F (36.8 C) (Oral)   Resp 14   Ht 5' 3.5" (1.613 m)   Wt 176 lb (79.8 kg)   SpO2 100%   BMI 30.69 kg/m   Physical Exam  Constitutional: She is oriented to person, place, and time. She appears well-developed and well-nourished. No distress.  Abdominal: Soft. She exhibits no distension. There is no tenderness. There is no rebound and no guarding.  Neurological: She is alert and oriented to person, place, and time.  Skin: She is not diaphoretic.  Nursing note and vitals reviewed.   Imaging: Ct Abdomen W Wo Contrast  Result Date: 01/08/2017 CLINICAL DATA:  Status post ablation for left renal mass. EXAM: CT ABDOMEN WITHOUT AND WITH CONTRAST TECHNIQUE: Multidetector CT imaging of the abdomen was performed following the standard protocol before and following the bolus administration of intravenous contrast. CONTRAST:  1041mISOVUE-300 IOPAMIDOL (ISOVUE-300) INJECTION 61% COMPARISON:  05/01/2016 FINDINGS: Lower chest: 3 mm right middle lobe pulmonary nodule (image 6 series 5) unchanged since prior study and also comparing to 10/01/2015. Hepatobiliary: The liver shows diffusely decreased attenuation suggesting steatosis. There is no evidence for gallstones, gallbladder  wall thickening, or pericholecystic fluid. No intrahepatic or extrahepatic biliary dilation. Pancreas: No focal mass lesion. No dilatation of the main duct. No intraparenchymal cyst. No peripancreatic edema. Spleen: No splenomegaly. No focal mass lesion. Adrenals/Urinary Tract: No adrenal nodule or mass. Precontrast imaging shows no stone disease in either kidney. After IV contrast administration, no enhancing lesion identified in the right kidney. Postcontrast imaging of the left kidney shows interval evolution and contraction of granulation/scarring related to medial interpolar renal ablation. This is at the site of the lesion visible on the 10/01/2015 exam. No new soft tissue or abnormal enhancement to suggest local recurrence. 8 mm cystic lesion extreme lower pole has increased slightly in size in the interval  from 6 mm on prior study. Dominant exophytic upper pole left renal lesion remains compatible with a simple cyst that measures 5.5 cm today, unchanged. No hydronephrosis in either kidney. Stomach/Bowel: Stomach is nondistended. No gastric wall thickening. No evidence of outlet obstruction. Duodenum is normally positioned as is the ligament of Treitz. The visualized small bowel loops and colonic segments of the abdomen are unremarkable aside from some diverticular change in the visualized left colon. Vascular/Lymphatic: There is abdominal aortic atherosclerosis without aneurysm. Previously identified small lymph nodes in the gastrohepatic and hepatoduodenal ligaments are stable. There is no retroperitoneal or left perinephric lymphadenopathy on the current exam. Other: No intraperitoneal free fluid. Musculoskeletal: Bone windows reveal no worrisome lytic or sclerotic osseous lesions. IMPRESSION: 1. Interval evolution/retraction of the ablation defect interpolar left kidney. No evidence for local recurrence. 2. No evidence for metastatic disease in the abdomen. 3. Stable 6 mm right middle lobe pulmonary  nodule, compatible with benign process. 4.  Abdominal Aortic Atherosclerois (ICD10-170.0) 5. Left colonic diverticulosis. 6. Hepatic steatosis. Electronically Signed   By: Misty Stanley M.D.   On: 01/08/2017 09:08    Labs:  CBC:  Recent Labs  01/12/16 1055 01/19/16 0341  WBC 6.1 8.5  HGB 12.8 11.6*  HCT 40.8 34.8*  PLT 243 220    COAGS:  Recent Labs  01/12/16 1055  INR 1.16  APTT 27    BMP:  Recent Labs  01/12/16 1055 01/19/16 0341 02/02/16 1042 04/25/16 1038 08/01/16 1014 01/01/17 0954  NA 143 140 140  --  142  --   K 4.3 3.9 4.3  --  4.0  --   CL 109 107 108  --  106  --   CO2 '26 25 22  ' --  27  --   GLUCOSE 92 90 83  --  114*  --   BUN '13 10 13 14 12 11  ' CALCIUM 9.5 9.0 9.6  --  10.2  --   CREATININE 0.89 0.89 1.02* 1.06* 0.98 1.00*  GFRNONAA >60 >60 54* 51*  --  55*  GFRAA >60 >60 62 59*  --  63    LIVER FUNCTION TESTS:  Recent Labs  02/02/16 1042 08/01/16 1014  BILITOT 0.3 0.4  AST 20 20  ALT 12 14  ALKPHOS 70 66  PROT 7.0 7.8  ALBUMIN 4.2 4.4     Assessment and Plan:  Follow-up CT on 01/08/2017 was reviewed with Jenny Madden. This demonstrates continued retraction and decrease in size of the medial interpolar ablation defect of the left kidney after cryoablation. There is no evidence of abnormal enhancement to suggest tumor recurrence. No other renal lesions are identified. Renal function is stable and within normal limits. I recommended a follow-up CT in one year.  Electronically SignedAletta Edouard T 01/09/2017, 9:57 AM     I spent a total of 15 Minutes in face to face in clinical consultation, greater than 50% of which was counseling/coordinating care post cryoablation of a left renal neoplasm.

## 2017-01-28 ENCOUNTER — Other Ambulatory Visit: Payer: Self-pay | Admitting: Internal Medicine

## 2017-01-30 ENCOUNTER — Other Ambulatory Visit (INDEPENDENT_AMBULATORY_CARE_PROVIDER_SITE_OTHER): Payer: Medicare HMO

## 2017-01-30 ENCOUNTER — Ambulatory Visit (INDEPENDENT_AMBULATORY_CARE_PROVIDER_SITE_OTHER): Payer: Medicare HMO | Admitting: Internal Medicine

## 2017-01-30 ENCOUNTER — Encounter: Payer: Self-pay | Admitting: Internal Medicine

## 2017-01-30 VITALS — BP 124/70 | HR 84 | Temp 98.6°F | Resp 12 | Ht 63.5 in | Wt 174.0 lb

## 2017-01-30 DIAGNOSIS — E119 Type 2 diabetes mellitus without complications: Secondary | ICD-10-CM

## 2017-01-30 DIAGNOSIS — I10 Essential (primary) hypertension: Secondary | ICD-10-CM

## 2017-01-30 LAB — HEMOGLOBIN A1C: HEMOGLOBIN A1C: 6.5 % (ref 4.6–6.5)

## 2017-01-30 MED ORDER — ALPRAZOLAM 0.5 MG PO TABS: 0.5000 mg | ORAL_TABLET | Freq: Every day | ORAL | 0 refills | Status: DC | PRN

## 2017-01-30 MED ORDER — FLUTICASONE PROPIONATE 50 MCG/ACT NA SUSP: 2.0000 | Freq: Every day | NASAL | 3 refills | Status: DC

## 2017-01-30 NOTE — Patient Instructions (Signed)
We have sent in the refills of the xanax and the flonase.   We are checking the sugar levels today.

## 2017-01-30 NOTE — Progress Notes (Signed)
Pre visit review using our clinic review tool, if applicable. No additional management support is needed unless otherwise documented below in the visit note. 

## 2017-01-30 NOTE — Progress Notes (Signed)
   Subjective:    Patient ID: Jenny Madden, female    DOB: 06/14/1941, 76 y.o.   MRN: [id number]  HPI The patient is a 76 YO female coming in for follow up of her diabetes (taking metformin BID and no side effects, well controlled, not complicated, diet about the same, not exercising) and her blood pressure (taking amlodipine and doing well, no side effects, not complicated). No new concerns.   Review of Systems  Constitutional: Negative for activity change, appetite change, fatigue, fever and unexpected weight change.  Respiratory: Negative.   Cardiovascular: Negative.   Gastrointestinal: Negative.   Musculoskeletal: Negative.   Skin: Negative.   Neurological: Negative.       Objective:   Physical Exam  Constitutional: She is oriented to person, place, and time. She appears well-developed and well-nourished.  HENT:  Head: Normocephalic and atraumatic.  Eyes: EOM are normal.  Neck: Normal range of motion.  Cardiovascular: Normal rate and regular rhythm.   Pulmonary/Chest: Effort normal. No respiratory distress. She has no wheezes. She has no rales.  Abdominal: Soft.  Neurological: She is alert and oriented to person, place, and time.  Skin: Skin is warm and dry.   Vitals:   01/30/17 0917  BP: 124/70  Pulse: 84  Resp: 12  Temp: 98.6 F (37 C)  TempSrc: Oral  SpO2: 98%  Weight: 174 lb (78.9 kg)  Height: 5' 3.5" (1.613 m)      Assessment & Plan:

## 2017-01-31 NOTE — Assessment & Plan Note (Signed)
Controlled on amlodipine and last CMP at goal without indications for change.

## 2017-01-31 NOTE — Assessment & Plan Note (Signed)
Taking metformin 500 mg BID and checking HgA1c today and adjust as needed. Not complicated.

## 2017-02-05 ENCOUNTER — Other Ambulatory Visit: Payer: Self-pay | Admitting: *Deleted

## 2017-02-05 MED ORDER — AMLODIPINE BESYLATE 10 MG PO TABS: 10.0000 mg | ORAL_TABLET | Freq: Every day | ORAL | 2 refills | Status: DC

## 2017-02-05 NOTE — Telephone Encounter (Signed)
Rec'd fax pt requesting 90 day script for her Alprazolam 0.5 mg.../lmb

## 2017-02-05 NOTE — Telephone Encounter (Signed)
Based on fill records the rx printed on 01/30/17 was a 3 month supply and is not due for fill.

## 2017-02-20 ENCOUNTER — Ambulatory Visit (HOSPITAL_COMMUNITY)
Admission: RE | Admit: 2017-02-20 | Discharge: 2017-02-20 | Disposition: A | Discharge status: Home / Self Care | Payer: Medicare HMO | Source: Ambulatory Visit | Attending: Urology | Admitting: Urology

## 2017-02-20 ENCOUNTER — Other Ambulatory Visit (HOSPITAL_COMMUNITY): Payer: Self-pay | Admitting: Urology

## 2017-02-20 ENCOUNTER — Encounter: Payer: Self-pay | Admitting: Gynecology

## 2017-02-20 DIAGNOSIS — D49512 Neoplasm of unspecified behavior of left kidney: Secondary | ICD-10-CM | POA: Insufficient documentation

## 2017-02-27 DIAGNOSIS — D49512 Neoplasm of unspecified behavior of left kidney: Secondary | ICD-10-CM | POA: Diagnosis not present

## 2017-03-22 ENCOUNTER — Encounter: Payer: Self-pay | Admitting: Interventional Radiology

## 2017-04-04 IMAGING — CT CT ABDOMEN WO/W CM
3 of 11 series · 12 of 46 positions shown, 18 images · IV contrast (iopamidol)
Comparison: MRI 11/01/2015 and CT scan 10/01/2015

CLINICAL DATA: Restaging left renal lesion status post
cryoablation.

EXAM:
CT ABDOMEN WITHOUT AND WITH CONTRAST
TECHNIQUE: Multidetector CT imaging of the abdomen was performed following the
standard protocol before and following the bolus administration of
intravenous contrast.
CONTRAST:  100mL H9NH32-IMM IOPAMIDOL (H9NH32-IMM) INJECTION 61%

[Series 2: renal arterial · axial · arterial · 0.74mm/px · z∈[-242,-108]mm · 5 of 80 slices shown]
[im 12/80  soft-tissue]
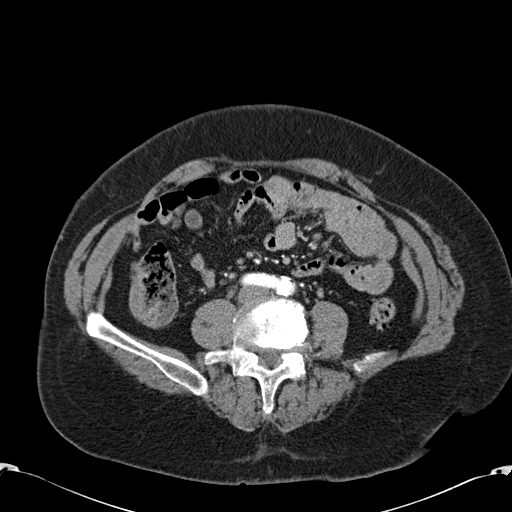
[im 23/80  soft-tissue]
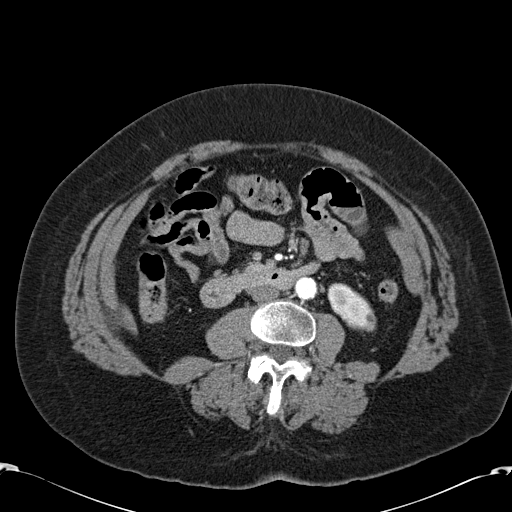
[im 34/80  soft-tissue]
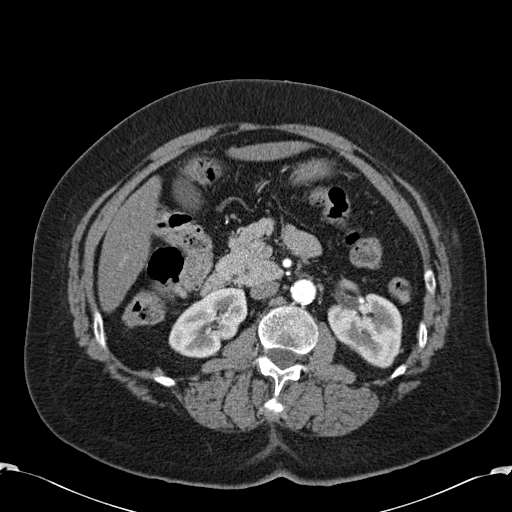
[im 46/80  soft-tissue]
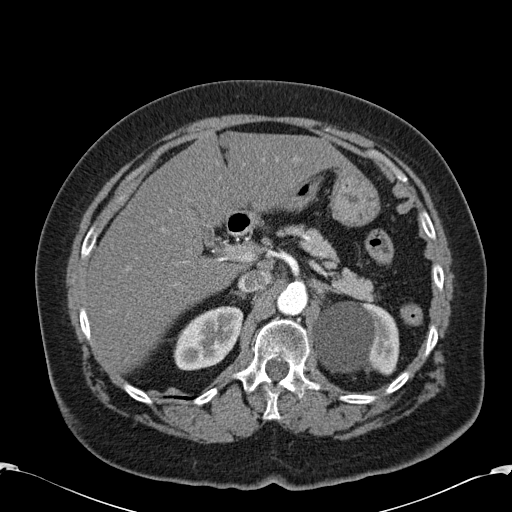
[im 57/80  soft-tissue]
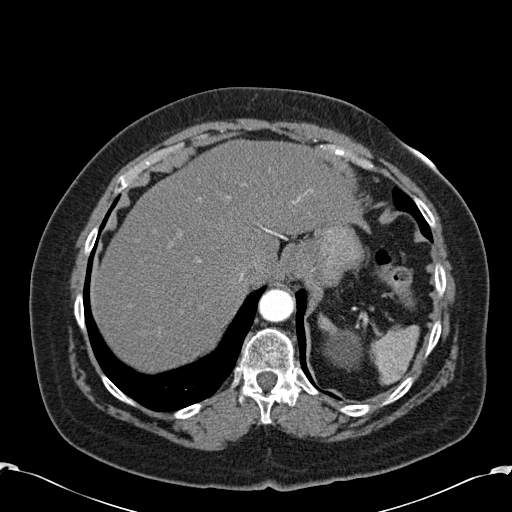

[Series 4: venous · axial · portal-venous · 0.74mm/px · z∈[-236,-80]mm · 5 of 80 slices shown, 10 images]
[im 14/80  soft-tissue]
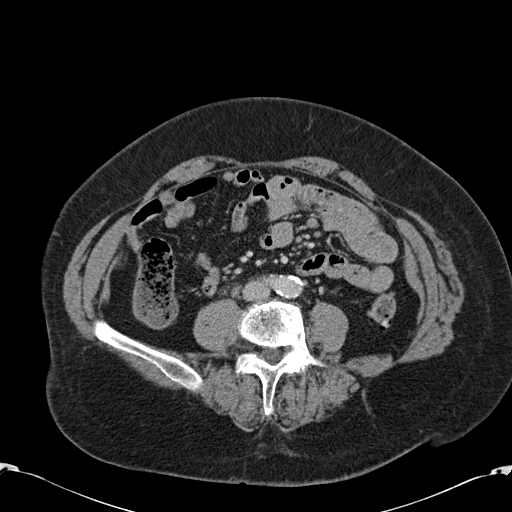
[im 14/80  bone]
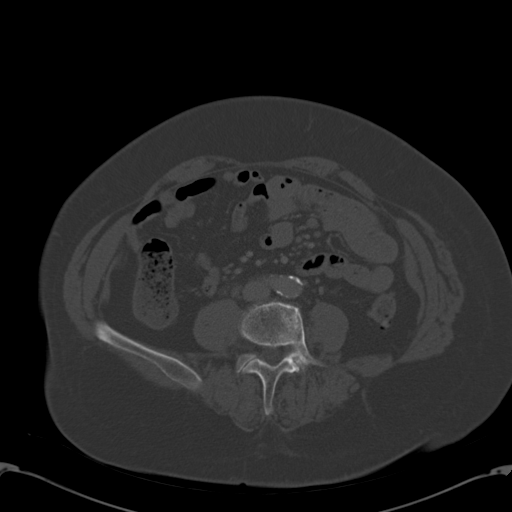
[im 27/80  soft-tissue]
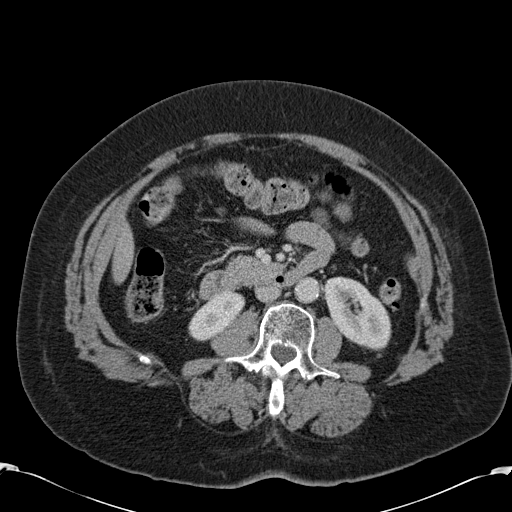
[im 27/80  lung]
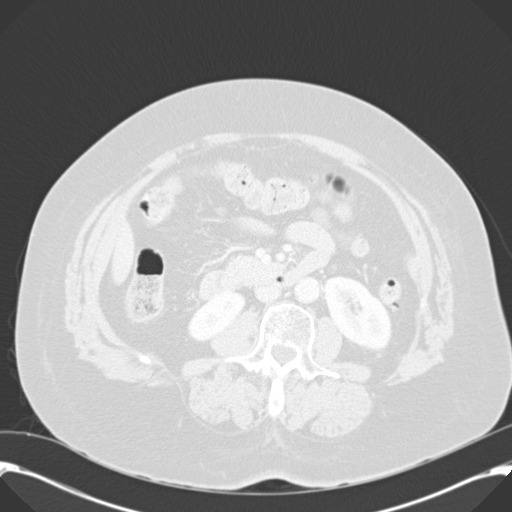
[im 40/80  soft-tissue]
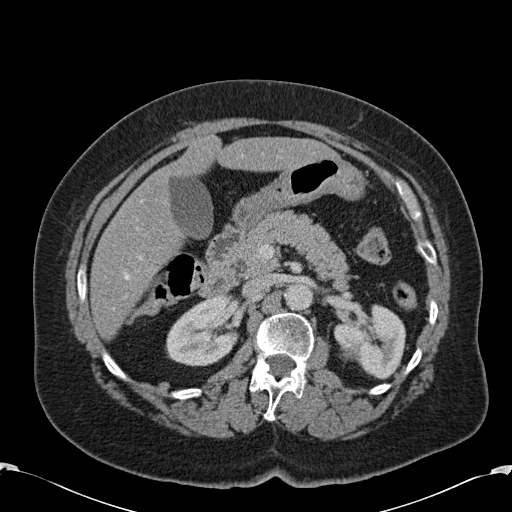
[im 40/80  lung]
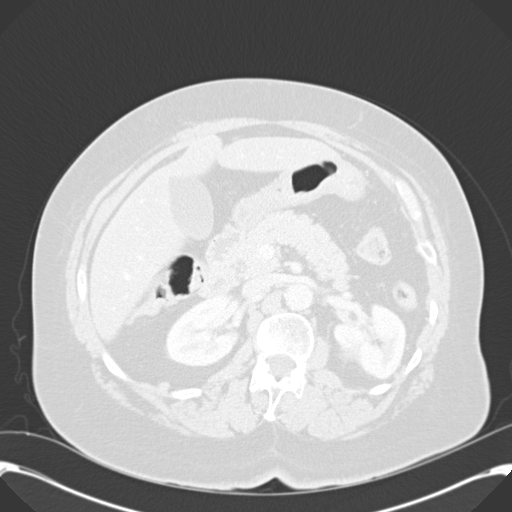
[im 53/80  soft-tissue]
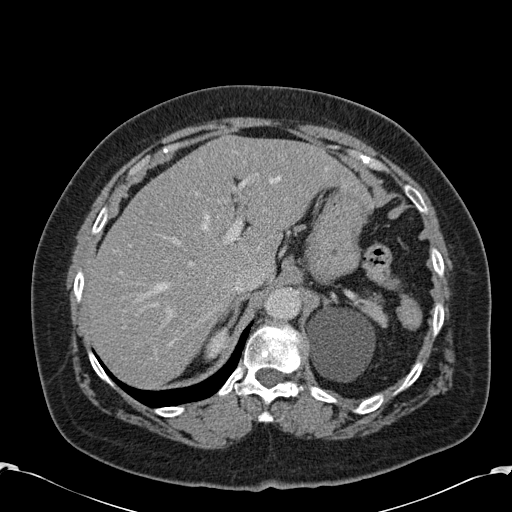
[im 53/80  lung]
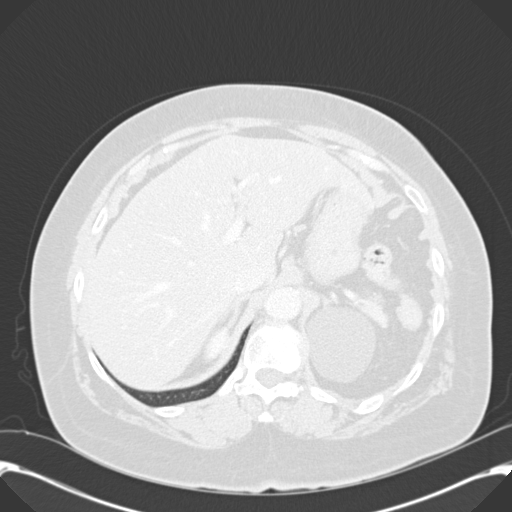
[im 66/80  soft-tissue]
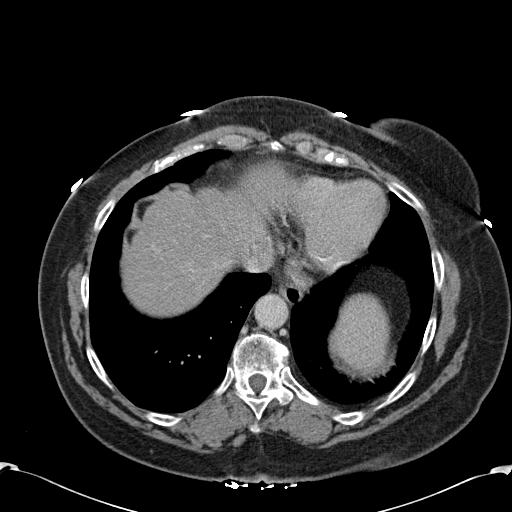
[im 66/80  lung]
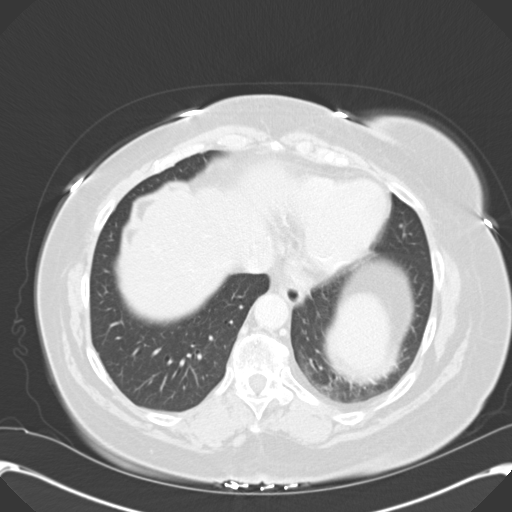

[Series 603: <mpr thick range> · coronal · 0.74mm/px · 2 of 156 slices shown, 3 images]
[im 52/156  soft-tissue]
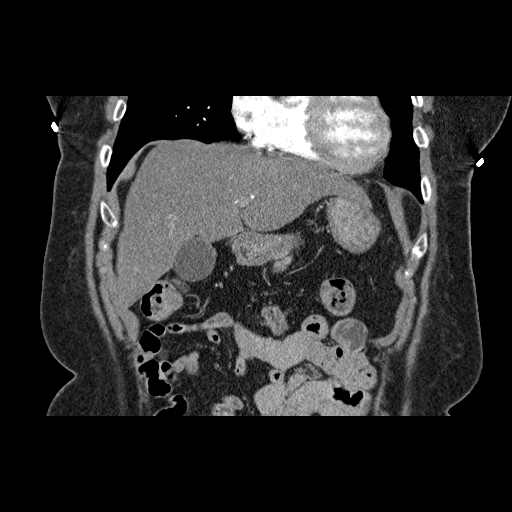
[im 52/156  bone]
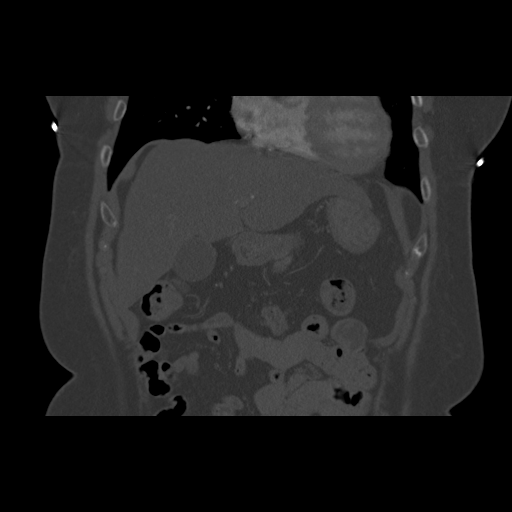
[im 104/156  soft-tissue]
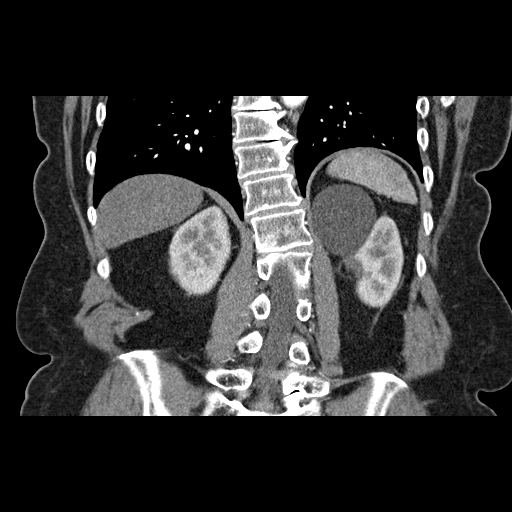

[12 of 46 positions shown; findings below may reference images not displayed]

FINDINGS: Lower chest: The lung bases are clear of acute process. Left basilar
scarring changes. No worrisome pulmonary lesions. The heart is
normal in size. No pericardial effusion. Extensive coronary artery
calcifications are noted. Mitral valve annular calcifications are
noted.

Hepatobiliary: Mild diffuse fatty infiltration of the liver but no
focal hepatic lesions or intrahepatic biliary dilatation. The
gallbladder is normal. No common bile duct dilatation.

Pancreas: No mass, inflammation or ductal dilatation.

Spleen: Normal size.  No focal lesions.

Adrenals/Urinary Tract: The adrenal glands are normal.

The right kidney is unremarkable and stable. No renal lesions, renal
calculi or hydronephrosis.

The left kidney demonstrates expected changes from recent
cryoablation procedure and January 2016. No CT findings for residual
or recurrent tumor. Stable large upper pole simple left renal cyst.
No collecting system abnormalities on the delayed images.

Stomach/Bowel: The stomach, duodenum, small bowel and colon grossly
normal without oral contrast. Moderate descending colon
diverticulosis.

Vascular/Lymphatic: Advanced atherosclerotic calcifications
involving the aorta and branch vessels. No aneurysm or dissection.
No mesenteric or retroperitoneal mass or adenopathy. Small scattered
lymph nodes are noted.

Other: No abdominal wall hernia or subcutaneous lesions.

Musculoskeletal: No significant bony findings.
IMPRESSION: 1. Expected cryoablation changes involving the left kidney. No
findings for residual or recurrent tumor. Recommend continued
routine surveillance.
2. Stable simple upper pole left renal cyst. The right kidney is
normal and stable.
3. No findings for abdominal metastatic disease.
4. Advanced atherosclerotic calcifications involving the aorta and
branch vessels.
5. Mild diffuse fatty infiltration of the liver.

## 2017-04-16 DIAGNOSIS — H40051 Ocular hypertension, right eye: Secondary | ICD-10-CM | POA: Diagnosis not present

## 2017-04-16 DIAGNOSIS — H40023 Open angle with borderline findings, high risk, bilateral: Secondary | ICD-10-CM | POA: Diagnosis not present

## 2017-04-23 ENCOUNTER — Other Ambulatory Visit: Payer: Self-pay | Admitting: Internal Medicine

## 2017-06-17 ENCOUNTER — Other Ambulatory Visit: Payer: Self-pay | Admitting: Internal Medicine

## 2017-06-25 ENCOUNTER — Ambulatory Visit: Payer: Medicare HMO

## 2017-06-28 ENCOUNTER — Ambulatory Visit: Payer: Medicare HMO

## 2017-07-02 ENCOUNTER — Ambulatory Visit: Payer: Medicare HMO

## 2017-07-05 ENCOUNTER — Ambulatory Visit (INDEPENDENT_AMBULATORY_CARE_PROVIDER_SITE_OTHER): Payer: Medicare HMO

## 2017-07-05 DIAGNOSIS — Z23 Encounter for immunization: Secondary | ICD-10-CM | POA: Diagnosis not present

## 2017-07-25 ENCOUNTER — Other Ambulatory Visit: Payer: Self-pay | Admitting: Internal Medicine

## 2017-07-25 DIAGNOSIS — Z1231 Encounter for screening mammogram for malignant neoplasm of breast: Secondary | ICD-10-CM

## 2017-08-07 ENCOUNTER — Encounter: Payer: Medicare HMO | Admitting: Internal Medicine

## 2017-08-13 ENCOUNTER — Encounter: Payer: Medicare HMO | Admitting: Internal Medicine

## 2017-08-16 ENCOUNTER — Ambulatory Visit
Admission: RE | Admit: 2017-08-16 | Discharge: 2017-08-16 | Disposition: A | Discharge status: Home / Self Care | Payer: Medicare HMO | Source: Ambulatory Visit | Attending: Internal Medicine | Admitting: Internal Medicine

## 2017-08-16 DIAGNOSIS — Z1231 Encounter for screening mammogram for malignant neoplasm of breast: Secondary | ICD-10-CM | POA: Diagnosis not present

## 2017-08-23 ENCOUNTER — Encounter: Payer: Medicare HMO | Admitting: Internal Medicine

## 2017-09-03 ENCOUNTER — Encounter: Payer: Self-pay | Admitting: Internal Medicine

## 2017-09-03 ENCOUNTER — Other Ambulatory Visit (INDEPENDENT_AMBULATORY_CARE_PROVIDER_SITE_OTHER): Payer: Medicare HMO

## 2017-09-03 ENCOUNTER — Ambulatory Visit (INDEPENDENT_AMBULATORY_CARE_PROVIDER_SITE_OTHER): Payer: Medicare HMO | Admitting: Internal Medicine

## 2017-09-03 VITALS — BP 120/70 | HR 81 | Temp 97.5°F | Ht 63.5 in | Wt 167.0 lb

## 2017-09-03 DIAGNOSIS — E119 Type 2 diabetes mellitus without complications: Secondary | ICD-10-CM

## 2017-09-03 DIAGNOSIS — E785 Hyperlipidemia, unspecified: Secondary | ICD-10-CM | POA: Diagnosis not present

## 2017-09-03 DIAGNOSIS — E1169 Type 2 diabetes mellitus with other specified complication: Secondary | ICD-10-CM | POA: Diagnosis not present

## 2017-09-03 DIAGNOSIS — Z0000 Encounter for general adult medical examination without abnormal findings: Secondary | ICD-10-CM | POA: Diagnosis not present

## 2017-09-03 DIAGNOSIS — I10 Essential (primary) hypertension: Secondary | ICD-10-CM | POA: Diagnosis not present

## 2017-09-03 DIAGNOSIS — E2839 Other primary ovarian failure: Secondary | ICD-10-CM

## 2017-09-03 LAB — COMPREHENSIVE METABOLIC PANEL
ALBUMIN: 4.2 g/dL (ref 3.5–5.2)
ALK PHOS: 69 U/L (ref 39–117)
ALT: 14 U/L (ref 0–35)
AST: 22 U/L (ref 0–37)
BUN: 11 mg/dL (ref 6–23)
CO2: 27 mEq/L (ref 19–32)
Calcium: 10 mg/dL (ref 8.4–10.5)
Chloride: 105 mEq/L (ref 96–112)
Creatinine, Ser: 0.87 mg/dL (ref 0.40–1.20)
GFR: 81.18 mL/min (ref >60.00)
Glucose, Bld: 113 mg/dL — ABNORMAL HIGH (ref 70–99)
POTASSIUM: 3.8 meq/L (ref 3.5–5.1)
Sodium: 140 mEq/L (ref 135–145)
TOTAL PROTEIN: 7.5 g/dL (ref 6.0–8.3)
Total Bilirubin: 0.4 mg/dL (ref 0.2–1.2)

## 2017-09-03 LAB — CBC
HEMATOCRIT: 42.5 % (ref 36.0–46.0)
HEMOGLOBIN: 13.6 g/dL (ref 12.0–15.0)
MCHC: 32 g/dL (ref 30.0–36.0)
MCV: 85.3 fl (ref 78.0–100.0)
PLATELETS: 254 10*3/uL (ref 150.0–400.0)
RBC: 4.98 Mil/uL (ref 3.87–5.11)
RDW: 13.5 % (ref 11.5–15.5)
WBC: 5.7 10*3/uL (ref 4.0–10.5)

## 2017-09-03 LAB — LIPID PANEL
CHOL/HDL RATIO: 3
CHOLESTEROL: 176 mg/dL (ref 0–200)
HDL: 54.3 mg/dL (ref >39.00)
LDL Cholesterol: 107 mg/dL — ABNORMAL HIGH (ref 0–99)
NONHDL: 121.46
Triglycerides: 70 mg/dL (ref 0.0–149.0)
VLDL: 14 mg/dL (ref 0.0–40.0)

## 2017-09-03 LAB — HEMOGLOBIN A1C: HEMOGLOBIN A1C: 6.2 % (ref 4.6–6.5)

## 2017-09-03 MED ORDER — SIMVASTATIN 40 MG PO TABS: 40.0000 mg | ORAL_TABLET | Freq: Every evening | ORAL | 3 refills | Status: DC

## 2017-09-03 MED ORDER — METFORMIN HCL 500 MG PO TABS: 500.0000 mg | ORAL_TABLET | Freq: Every day | ORAL | 3 refills | Status: DC

## 2017-09-03 MED ORDER — AMLODIPINE BESYLATE 10 MG PO TABS: 10.0000 mg | ORAL_TABLET | Freq: Every day | ORAL | 3 refills | Status: DC

## 2017-09-03 NOTE — Assessment & Plan Note (Signed)
Checking lipid panel and adjust simvastatin 40 mg daily for goal LDL<100.

## 2017-09-03 NOTE — Patient Instructions (Signed)
We have ordered the labs and the bone density.  Health Maintenance, Female Adopting a healthy lifestyle and getting preventive care can go a long way to promote health and wellness. Talk with your health care provider about what schedule of regular examinations is right for you. This is a good chance for you to check in with your provider about disease prevention and staying healthy. In between checkups, there are plenty of things you can do on your own. Experts have done a lot of research about which lifestyle changes and preventive measures are most likely to keep you healthy. Ask your health care provider for more information. Weight and diet Eat a healthy diet  Be sure to include plenty of vegetables, fruits, low-fat dairy products, and lean protein.  Do not eat a lot of foods high in solid fats, added sugars, or salt.  Get regular exercise. This is one of the most important things you can do for your health. ? Most adults should exercise for at least 150 minutes each week. The exercise should increase your heart rate and make you sweat (moderate-intensity exercise). ? Most adults should also do strengthening exercises at least twice a week. This is in addition to the moderate-intensity exercise.  Maintain a healthy weight  Body mass index (BMI) is a measurement that can be used to identify possible weight problems. It estimates body fat based on height and weight. Your health care provider can help determine your BMI and help you achieve or maintain a healthy weight.  For females 22 years of age and older: ? A BMI below 18.5 is considered underweight. ? A BMI of 18.5 to 24.9 is normal. ? A BMI of 25 to 29.9 is considered overweight. ? A BMI of 30 and above is considered obese.  Watch levels of cholesterol and blood lipids  You should start having your blood tested for lipids and cholesterol at 77 years of age, then have this test every 5 years.  You may need to have your  cholesterol levels checked more often if: ? Your lipid or cholesterol levels are high. ? You are older than 77 years of age. ? You are at high risk for heart disease.  Cancer screening Lung Cancer  Lung cancer screening is recommended for adults 78-107 years old who are at high risk for lung cancer because of a history of smoking.  A yearly low-dose CT scan of the lungs is recommended for people who: ? Currently smoke. ? Have quit within the past 15 years. ? Have at least a 30-pack-year history of smoking. A pack year is smoking an average of one pack of cigarettes a day for 1 year.  Yearly screening should continue until it has been 15 years since you quit.  Yearly screening should stop if you develop a health problem that would prevent you from having lung cancer treatment.  Breast Cancer  Practice breast self-awareness. This means understanding how your breasts normally appear and feel.  It also means doing regular breast self-exams. Let your health care provider know about any changes, no matter how small.  If you are in your 20s or 30s, you should have a clinical breast exam (CBE) by a health care provider every 1-3 years as part of a regular health exam.  If you are 27 or older, have a CBE every year. Also consider having a breast X-ray (mammogram) every year.  If you have a family history of breast cancer, talk to your health care provider  about genetic screening.  If you are at high risk for breast cancer, talk to your health care provider about having an MRI and a mammogram every year.  Breast cancer gene (BRCA) assessment is recommended for women who have family members with BRCA-related cancers. BRCA-related cancers include: ? Breast. ? Ovarian. ? Tubal. ? Peritoneal cancers.  Results of the assessment will determine the need for genetic counseling and BRCA1 and BRCA2 testing.  Cervical Cancer Your health care provider may recommend that you be screened regularly  for cancer of the pelvic organs (ovaries, uterus, and vagina). This screening involves a pelvic examination, including checking for microscopic changes to the surface of your cervix (Pap test). You may be encouraged to have this screening done every 3 years, beginning at age 19.  For women ages 50-65, health care providers may recommend pelvic exams and Pap testing every 3 years, or they may recommend the Pap and pelvic exam, combined with testing for human papilloma virus (HPV), every 5 years. Some types of HPV increase your risk of cervical cancer. Testing for HPV may also be done on women of any age with unclear Pap test results.  Other health care providers may not recommend any screening for nonpregnant women who are considered low risk for pelvic cancer and who do not have symptoms. Ask your health care provider if a screening pelvic exam is right for you.  If you have had past treatment for cervical cancer or a condition that could lead to cancer, you need Pap tests and screening for cancer for at least 20 years after your treatment. If Pap tests have been discontinued, your risk factors (such as having a new sexual partner) need to be reassessed to determine if screening should resume. Some women have medical problems that increase the chance of getting cervical cancer. In these cases, your health care provider may recommend more frequent screening and Pap tests.  Colorectal Cancer  This type of cancer can be detected and often prevented.  Routine colorectal cancer screening usually begins at 77 years of age and continues through 77 years of age.  Your health care provider may recommend screening at an earlier age if you have risk factors for colon cancer.  Your health care provider may also recommend using home test kits to check for hidden blood in the stool.  A small camera at the end of a tube can be used to examine your colon directly (sigmoidoscopy or colonoscopy). This is done to  check for the earliest forms of colorectal cancer.  Routine screening usually begins at age 28.  Direct examination of the colon should be repeated every 5-10 years through 77 years of age. However, you may need to be screened more often if early forms of precancerous polyps or small growths are found.  Skin Cancer  Check your skin from head to toe regularly.  Tell your health care provider about any new moles or changes in moles, especially if there is a change in a mole's shape or color.  Also tell your health care provider if you have a mole that is larger than the size of a pencil eraser.  Always use sunscreen. Apply sunscreen liberally and repeatedly throughout the day.  Protect yourself by wearing long sleeves, pants, a wide-brimmed hat, and sunglasses whenever you are outside.  Heart disease, diabetes, and high blood pressure  High blood pressure causes heart disease and increases the risk of stroke. High blood pressure is more likely to develop in: ?  People who have blood pressure in the high end of the normal range (130-139/85-89 mm Hg). ? People who are overweight or obese. ? People who are African American.  If you are 18-39 years of age, have your blood pressure checked every 3-5 years. If you are 40 years of age or older, have your blood pressure checked every year. You should have your blood pressure measured twice-once when you are at a hospital or clinic, and once when you are not at a hospital or clinic. Record the average of the two measurements. To check your blood pressure when you are not at a hospital or clinic, you can use: ? An automated blood pressure machine at a pharmacy. ? A home blood pressure monitor.  If you are between 55 years and 79 years old, ask your health care provider if you should take aspirin to prevent strokes.  Have regular diabetes screenings. This involves taking a blood sample to check your fasting blood sugar level. ? If you are at a  normal weight and have a low risk for diabetes, have this test once every three years after 77 years of age. ? If you are overweight and have a high risk for diabetes, consider being tested at a younger age or more often. Preventing infection Hepatitis B  If you have a higher risk for hepatitis B, you should be screened for this virus. You are considered at high risk for hepatitis B if: ? You were born in a country where hepatitis B is common. Ask your health care provider which countries are considered high risk. ? Your parents were born in a high-risk country, and you have not been immunized against hepatitis B (hepatitis B vaccine). ? You have HIV or AIDS. ? You use needles to inject street drugs. ? You live with someone who has hepatitis B. ? You have had sex with someone who has hepatitis B. ? You get hemodialysis treatment. ? You take certain medicines for conditions, including cancer, organ transplantation, and autoimmune conditions.  Hepatitis C  Blood testing is recommended for: ? Everyone born from 1945 through 1965. ? Anyone with known risk factors for hepatitis C.  Sexually transmitted infections (STIs)  You should be screened for sexually transmitted infections (STIs) including gonorrhea and chlamydia if: ? You are sexually active and are younger than 77 years of age. ? You are older than 77 years of age and your health care provider tells you that you are at risk for this type of infection. ? Your sexual activity has changed since you were last screened and you are at an increased risk for chlamydia or gonorrhea. Ask your health care provider if you are at risk.  If you do not have HIV, but are at risk, it may be recommended that you take a prescription medicine daily to prevent HIV infection. This is called pre-exposure prophylaxis (PrEP). You are considered at risk if: ? You are sexually active and do not regularly use condoms or know the HIV status of your  partner(s). ? You take drugs by injection. ? You are sexually active with a partner who has HIV.  Talk with your health care provider about whether you are at high risk of being infected with HIV. If you choose to begin PrEP, you should first be tested for HIV. You should then be tested every 3 months for as long as you are taking PrEP. Pregnancy  If you are premenopausal and you may become pregnant, ask your health   care provider about preconception counseling.  If you may become pregnant, take 400 to 800 micrograms (mcg) of folic acid every day.  If you want to prevent pregnancy, talk to your health care provider about birth control (contraception). Osteoporosis and menopause  Osteoporosis is a disease in which the bones lose minerals and strength with aging. This can result in serious bone fractures. Your risk for osteoporosis can be identified using a bone density scan.  If you are 65 years of age or older, or if you are at risk for osteoporosis and fractures, ask your health care provider if you should be screened.  Ask your health care provider whether you should take a calcium or vitamin D supplement to lower your risk for osteoporosis.  Menopause may have certain physical symptoms and risks.  Hormone replacement therapy may reduce some of these symptoms and risks. Talk to your health care provider about whether hormone replacement therapy is right for you. Follow these instructions at home:  Schedule regular health, dental, and eye exams.  Stay current with your immunizations.  Do not use any tobacco products including cigarettes, chewing tobacco, or electronic cigarettes.  If you are pregnant, do not drink alcohol.  If you are breastfeeding, limit how much and how often you drink alcohol.  Limit alcohol intake to no more than 1 drink per day for nonpregnant women. One drink equals 12 ounces of beer, 5 ounces of wine, or 1 ounces of hard liquor.  Do not use street  drugs.  Do not share needles.  Ask your health care provider for help if you need support or information about quitting drugs.  Tell your health care provider if you often feel depressed.  Tell your health care provider if you have ever been abused or do not feel safe at home. This information is not intended to replace advice given to you by your health care provider. Make sure you discuss any questions you have with your health care provider. Document Released: 04/09/2011 Document Revised: 03/01/2016 Document Reviewed: 06/28/2015 Elsevier Interactive Patient Education  2018 [employer].  

## 2017-09-03 NOTE — Assessment & Plan Note (Signed)
BP at goal on amlodipine. Checking CMP and adjust as needed.  

## 2017-09-03 NOTE — Progress Notes (Signed)
   Subjective:    Patient ID: Jenny Madden, female    DOB: Jun 01, 1941, 76 y.o.   MRN: [id number]  HPI Here for medicare wellness and physical, no new complaints. Please see A/P for status and treatment of chronic medical problems.   Diet: DM if diabetic Physical activity: sedentary, does strength training 2-3 times per week Depression/mood screen: negative, loss of spouse in September Hearing: intact to whispered voice Visual acuity: grossly normal, performs annual eye exam  ADLs: capable Fall risk: none Home safety: good Cognitive evaluation: intact to orientation, naming, recall and repetition EOL planning: adv directives discussed  I have personally reviewed and have noted 1. The patient's medical and social history - reviewed today no changes 2. Their use of alcohol, tobacco or illicit drugs 3. Their current medications and supplements 4. The patient's functional ability including ADL's, fall risks, home safety risks and hearing or visual impairment. 5. Diet and physical activities 6. Evidence for depression or mood disorders 7. Care team reviewed and updated (available in snapshot)  Review of Systems  Constitutional: Negative.   HENT: Negative.   Eyes: Negative.   Respiratory: Negative for cough, chest tightness and shortness of breath.   Cardiovascular: Negative for chest pain, palpitations and leg swelling.  Gastrointestinal: Negative for abdominal distention, abdominal pain, constipation, diarrhea, nausea and vomiting.  Musculoskeletal: Negative.   Skin: Negative.   Neurological: Negative.   Psychiatric/Behavioral: Negative.       Objective:   Physical Exam  Constitutional: She is oriented to person, place, and time. She appears well-developed and well-nourished.  HENT:  Head: Normocephalic and atraumatic.  Eyes: EOM are normal.  Neck: Normal range of motion. No thyromegaly present.  Cardiovascular: Normal rate and regular rhythm.  Pulmonary/Chest: Effort  normal and breath sounds normal. No respiratory distress. She has no wheezes. She has no rales.  Abdominal: Soft. Bowel sounds are normal. She exhibits no distension. There is no tenderness. There is no rebound.  Musculoskeletal: She exhibits no edema or tenderness.  Lymphadenopathy:    She has no cervical adenopathy.  Neurological: She is alert and oriented to person, place, and time. Coordination normal.  Skin: Skin is warm and dry.  Psychiatric: She has a normal mood and affect.   Vitals:   09/03/17 0854  BP: 120/70  Pulse: 81  Temp: (!) 97.5 F (36.4 C)  TempSrc: Oral  SpO2: 98%  Weight: 167 lb (75.8 kg)  Height: 5' 3.5" (1.613 m)      Assessment & Plan:

## 2017-09-03 NOTE — Assessment & Plan Note (Signed)
Ordered repeat bone density. Mammogram up to date. Colonoscopy up to date and will age out prior to next screening. Tetanus and flu and pneumonia up to date. Counseled about shingles. Counseled about sun safety and mole surveillance. Given 10 year screening recommendations.

## 2017-09-03 NOTE — Assessment & Plan Note (Signed)
Checking HgA1c, foot exam done today. Eye exam up to date. Not on ACE-I or ARB but on statin. Not complicated.

## 2017-09-09 ENCOUNTER — Telehealth: Payer: Self-pay | Admitting: Internal Medicine

## 2017-09-09 NOTE — Telephone Encounter (Signed)
Forwarding to MD assistant to call pt regarding lab results...Jenny Madden

## 2017-09-09 NOTE — Telephone Encounter (Signed)
Patient informed. 

## 2017-09-09 NOTE — Telephone Encounter (Signed)
See result note.  

## 2017-09-09 NOTE — Telephone Encounter (Signed)
Copied from [place]. Topic: Quick Communication - Lab Results >> Sep 09, 2017  9:18 AM Darl Householder, RMA wrote: Patient is requesting lab results, please call pt and if there is no answer pt said you can leave full details on VM

## 2017-09-21 ENCOUNTER — Other Ambulatory Visit: Payer: Self-pay | Admitting: Internal Medicine

## 2017-09-24 DIAGNOSIS — E119 Type 2 diabetes mellitus without complications: Secondary | ICD-10-CM | POA: Diagnosis not present

## 2017-09-24 DIAGNOSIS — H04123 Dry eye syndrome of bilateral lacrimal glands: Secondary | ICD-10-CM | POA: Diagnosis not present

## 2017-09-24 DIAGNOSIS — H40051 Ocular hypertension, right eye: Secondary | ICD-10-CM | POA: Diagnosis not present

## 2017-09-24 DIAGNOSIS — H40023 Open angle with borderline findings, high risk, bilateral: Secondary | ICD-10-CM | POA: Diagnosis not present

## 2017-09-24 LAB — HM DIABETES EYE EXAM

## 2017-09-25 ENCOUNTER — Encounter: Payer: Self-pay | Admitting: Internal Medicine

## 2017-09-25 ENCOUNTER — Telehealth: Payer: Self-pay | Admitting: Internal Medicine

## 2017-09-25 NOTE — Progress Notes (Signed)
Abstracted and sent to scan  

## 2017-09-25 NOTE — Telephone Encounter (Signed)
Copied from [place] [id number]. Topic: Inquiry >> Sep 25, 2017  8:31 AM Aurelio Brash B wrote: Reason for CRM:   PT was in office couple weeks ago and Dr  Sharlet Salina told her she wanted her to have a bone density  test,  pt wants to know where she calls to schedule that  or if the office is scheduling that for her  >> Sep 25, 2017 12:53 PM Peace, Tammy L wrote: Left patient vm to call back to scheduled DEXA

## 2017-10-03 ENCOUNTER — Ambulatory Visit (INDEPENDENT_AMBULATORY_CARE_PROVIDER_SITE_OTHER)
Admission: RE | Admit: 2017-10-03 | Discharge: 2017-10-03 | Disposition: A | Discharge status: Home / Self Care | Payer: Medicare HMO | Source: Ambulatory Visit | Attending: Internal Medicine | Admitting: Internal Medicine

## 2017-10-03 DIAGNOSIS — E2839 Other primary ovarian failure: Secondary | ICD-10-CM | POA: Diagnosis not present

## 2017-10-23 DIAGNOSIS — R69 Illness, unspecified: Secondary | ICD-10-CM | POA: Diagnosis not present

## 2017-11-07 ENCOUNTER — Encounter: Payer: Self-pay | Admitting: Internal Medicine

## 2017-11-07 ENCOUNTER — Ambulatory Visit (INDEPENDENT_AMBULATORY_CARE_PROVIDER_SITE_OTHER): Payer: Medicare HMO | Admitting: Internal Medicine

## 2017-11-07 DIAGNOSIS — K5900 Constipation, unspecified: Secondary | ICD-10-CM | POA: Diagnosis not present

## 2017-11-07 MED ORDER — SACCHAROMYCES BOULARDII 250 MG PO CAPS: 250.0000 mg | ORAL_CAPSULE | Freq: Every day | ORAL | 3 refills | Status: DC

## 2017-11-07 NOTE — Patient Instructions (Addendum)
You can try taking a probiotic over the counter. If you need to get one over the counter florastor (also called lactobacilus) is a good one to get.

## 2017-11-07 NOTE — Progress Notes (Signed)
   Subjective:    Patient ID: Jenny Madden, female    DOB: [birth year], 77 y.o.   MRN: [id number]  HPI The patient is a 77 YO female coming in for lower abdominal pain. Started over the weekend after eating collard greens. She took a laxative and the pain resolved. She denies constipation but was not going as much as usual. She denies nausea or vomiting associated. She has had recurrence of the pain after eating turnip greens which was also relieved with laxative. She does feel gassy with the pain. She denies diarrhea or blood in stool. She denies fevers or chills. No recent weight change. Last colonoscopy 11/07/16 with several polyps and repeat due in 5 years.   Review of Systems  Constitutional: Negative.   Respiratory: Negative for cough, chest tightness and shortness of breath.   Cardiovascular: Negative for chest pain, palpitations and leg swelling.  Gastrointestinal: Positive for abdominal pain. Negative for abdominal distention, constipation, diarrhea, nausea and vomiting.  Musculoskeletal: Negative.   Skin: Negative.   Neurological: Negative.       Objective:   Physical Exam  Constitutional: She is oriented to person, place, and time. She appears well-developed and well-nourished.  HENT:  Head: Normocephalic and atraumatic.  Eyes: EOM are normal.  Neck: Normal range of motion.  Cardiovascular: Normal rate and regular rhythm.  Pulmonary/Chest: Effort normal and breath sounds normal. No respiratory distress. She has no wheezes. She has no rales.  Abdominal: Soft. Bowel sounds are normal. She exhibits no distension. There is no tenderness. There is no rebound.  Musculoskeletal: She exhibits no edema.  Neurological: She is alert and oriented to person, place, and time. Coordination normal.  Skin: Skin is warm and dry.  Psychiatric: She has a normal mood and affect.   Vitals:   11/07/17 0919  BP: 110/70  Pulse: 81  Temp: 98.2 F (36.8 C)  TempSrc: Oral  SpO2: 99%  Weight:  166 lb (75.3 kg)  Height: 5' 3.5" (1.613 m)      Assessment & Plan:

## 2017-11-07 NOTE — Assessment & Plan Note (Signed)
Suspect that this was the cause of the pain. She is advised to eat more fiber. Can use probiotic to see if this helps restore normal function and rx sent in. Colonoscopy up to date and no imaging required.

## 2017-11-08 ENCOUNTER — Telehealth: Payer: Self-pay

## 2017-11-08 NOTE — Telephone Encounter (Signed)
LVM informing patient.

## 2017-11-08 NOTE — Telephone Encounter (Signed)
She can pick any otc probiotic over the counter. Most are about the same in effectiveness.

## 2017-11-08 NOTE — Telephone Encounter (Signed)
Copied from [place]. Topic: General - Other >> Nov 08, 2017  8:07 AM Carolyn Stare wrote:  Pt would like call back concerning a probiotic that Dr Sharlet Salina wanted her to get at the pharmacy    [phone]

## 2017-11-08 NOTE — Telephone Encounter (Signed)
Called patient and she wants to know if there is an alternative to the probiotic that was sent in because it was $30. States she got one called lactonex but its a chewable to take 4 times a day and she does not like it.

## 2017-11-22 ENCOUNTER — Telehealth: Payer: Self-pay | Admitting: Internal Medicine

## 2017-11-22 MED ORDER — ALPRAZOLAM 0.5 MG PO TABS: 0.5000 mg | ORAL_TABLET | Freq: Every day | ORAL | 0 refills | Status: DC | PRN

## 2017-11-22 MED ORDER — SIMVASTATIN 40 MG PO TABS: 40.0000 mg | ORAL_TABLET | Freq: Every evening | ORAL | 2 refills | Status: DC

## 2017-11-22 MED ORDER — METFORMIN HCL 500 MG PO TABS: 500.0000 mg | ORAL_TABLET | Freq: Every day | ORAL | 2 refills | Status: DC

## 2017-11-22 MED ORDER — AMLODIPINE BESYLATE 10 MG PO TABS: 10.0000 mg | ORAL_TABLET | Freq: Every day | ORAL | 2 refills | Status: DC

## 2017-11-22 NOTE — Telephone Encounter (Signed)
Copied from [place] [phone]. Topic: Quick Communication - Rx Refill/Question >> Nov 22, 2017 10:19 AM Bea Graff, NT wrote: Medication: Xanax, amlodipine, metformin, and simvastatin   Has the patient contacted their pharmacy? Yes.     (Agent: If no, request that the patient contact the pharmacy for the refill.)   Preferred Pharmacy (with phone number or street name): [place]: Please be advised that RX refills may take up to 3 business days. We ask that you follow-up with your pharmacy.

## 2017-11-22 NOTE — Telephone Encounter (Signed)
Sent in refill

## 2017-11-22 NOTE — Telephone Encounter (Signed)
Pt requesting medication to be sent to [facility] order due to pt no longer using [facility] mail order. Medications sent to requested pharmacy.   Pt also asking for a refill of Xanax 0.5mg  Last prescribed on 01/30/17 with 15 tabs.

## 2017-12-05 ENCOUNTER — Telehealth: Payer: Self-pay

## 2017-12-05 NOTE — Telephone Encounter (Signed)
Patient informed. States that she was only ever taking 1/2 a pill every day to begin with and that she started taking it everyday starting Tuesday 12/03/2017 so will continue to take 1/2 pill every other day till Monday and then stop. States she will give it a couple of days and if still not feeling right she will call back

## 2017-12-05 NOTE — Telephone Encounter (Signed)
Can try 1/2 pill daily for 1 week, then 1/2 pill every other day for 1 week then stop.

## 2017-12-05 NOTE — Telephone Encounter (Signed)
Copied from [place] [id number]. Topic: Inquiry >> Dec 05, 2017  1:51 PM Corie Chiquito, [place] wrote: Reason for CRM: Patient calling because she would like to speak with Dr.Crawford's nurse about her medication. If she could give her a call back at [phone]

## 2017-12-05 NOTE — Telephone Encounter (Signed)
Patient states that she wants to stop taking her alprazolam but each time she stops taking it "cold [place]" she got all nervous and it makes her stomach in knots. States she just does not want to take it anymore and wants to know the best way to stop it. States daughter told her to taper off but wants PCP opinion

## 2017-12-09 ENCOUNTER — Other Ambulatory Visit: Payer: Self-pay | Admitting: *Deleted

## 2017-12-09 ENCOUNTER — Other Ambulatory Visit (HOSPITAL_COMMUNITY): Payer: Self-pay | Admitting: Interventional Radiology

## 2017-12-09 DIAGNOSIS — N2889 Other specified disorders of kidney and ureter: Secondary | ICD-10-CM

## 2017-12-13 ENCOUNTER — Emergency Department (HOSPITAL_COMMUNITY): Payer: Medicare HMO

## 2017-12-13 ENCOUNTER — Emergency Department (HOSPITAL_COMMUNITY)
Admission: EM | Admit: 2017-12-13 | Discharge: 2017-12-13 | Disposition: A | Discharge status: Home / Self Care | Payer: Medicare HMO | Attending: Emergency Medicine | Admitting: Emergency Medicine

## 2017-12-13 ENCOUNTER — Other Ambulatory Visit: Payer: Self-pay

## 2017-12-13 ENCOUNTER — Encounter (HOSPITAL_COMMUNITY): Payer: Self-pay | Admitting: Emergency Medicine

## 2017-12-13 DIAGNOSIS — Z79899 Other long term (current) drug therapy: Secondary | ICD-10-CM | POA: Insufficient documentation

## 2017-12-13 DIAGNOSIS — K219 Gastro-esophageal reflux disease without esophagitis: Secondary | ICD-10-CM | POA: Diagnosis not present

## 2017-12-13 DIAGNOSIS — R748 Abnormal levels of other serum enzymes: Secondary | ICD-10-CM | POA: Insufficient documentation

## 2017-12-13 DIAGNOSIS — N189 Chronic kidney disease, unspecified: Secondary | ICD-10-CM | POA: Diagnosis not present

## 2017-12-13 DIAGNOSIS — I129 Hypertensive chronic kidney disease with stage 1 through stage 4 chronic kidney disease, or unspecified chronic kidney disease: Secondary | ICD-10-CM | POA: Diagnosis not present

## 2017-12-13 DIAGNOSIS — I10 Essential (primary) hypertension: Secondary | ICD-10-CM | POA: Diagnosis not present

## 2017-12-13 DIAGNOSIS — J189 Pneumonia, unspecified organism: Secondary | ICD-10-CM | POA: Diagnosis not present

## 2017-12-13 DIAGNOSIS — E119 Type 2 diabetes mellitus without complications: Secondary | ICD-10-CM | POA: Insufficient documentation

## 2017-12-13 DIAGNOSIS — Z7982 Long term (current) use of aspirin: Secondary | ICD-10-CM | POA: Diagnosis not present

## 2017-12-13 DIAGNOSIS — Z87891 Personal history of nicotine dependence: Secondary | ICD-10-CM | POA: Diagnosis not present

## 2017-12-13 DIAGNOSIS — R1013 Epigastric pain: Secondary | ICD-10-CM | POA: Diagnosis present

## 2017-12-13 DIAGNOSIS — R1030 Lower abdominal pain, unspecified: Secondary | ICD-10-CM | POA: Diagnosis not present

## 2017-12-13 DIAGNOSIS — R079 Chest pain, unspecified: Secondary | ICD-10-CM | POA: Diagnosis not present

## 2017-12-13 DIAGNOSIS — Z7984 Long term (current) use of oral hypoglycemic drugs: Secondary | ICD-10-CM | POA: Insufficient documentation

## 2017-12-13 DIAGNOSIS — K2960 Other gastritis without bleeding: Secondary | ICD-10-CM | POA: Insufficient documentation

## 2017-12-13 DIAGNOSIS — J181 Lobar pneumonia, unspecified organism: Secondary | ICD-10-CM

## 2017-12-13 LAB — URINALYSIS, ROUTINE W REFLEX MICROSCOPIC
Bilirubin Urine: NEGATIVE
GLUCOSE, UA: NEGATIVE mg/dL
Hgb urine dipstick: NEGATIVE
KETONES UR: NEGATIVE mg/dL
LEUKOCYTES UA: NEGATIVE
Nitrite: NEGATIVE
PH: 8 (ref 5.0–8.0)
Protein, ur: NEGATIVE mg/dL
SPECIFIC GRAVITY, URINE: 1.003 — AB (ref 1.005–1.030)

## 2017-12-13 LAB — BASIC METABOLIC PANEL
Anion gap: 11 (ref 5–15)
BUN: 10 mg/dL (ref 6–20)
CHLORIDE: 107 mmol/L (ref 101–111)
CO2: 23 mmol/L (ref 22–32)
Calcium: 9.7 mg/dL (ref 8.9–10.3)
Creatinine, Ser: 1.02 mg/dL — ABNORMAL HIGH (ref 0.44–1.00)
GFR calc Af Amer: 60 mL/min — ABNORMAL LOW (ref >60)
GFR calc non Af Amer: 52 mL/min — ABNORMAL LOW (ref >60)
GLUCOSE: 93 mg/dL (ref 65–99)
POTASSIUM: 3.9 mmol/L (ref 3.5–5.1)
Sodium: 141 mmol/L (ref 135–145)

## 2017-12-13 LAB — CBC
HEMATOCRIT: 43.6 % (ref 36.0–46.0)
HEMOGLOBIN: 13.6 g/dL (ref 12.0–15.0)
MCH: 27.1 pg (ref 26.0–34.0)
MCHC: 31.2 g/dL (ref 30.0–36.0)
MCV: 87 fL (ref 78.0–100.0)
Platelets: 292 10*3/uL (ref 150–400)
RBC: 5.01 MIL/uL (ref 3.87–5.11)
RDW: 12.9 % (ref 11.5–15.5)
WBC: 7.6 10*3/uL (ref 4.0–10.5)

## 2017-12-13 LAB — HEPATIC FUNCTION PANEL
ALK PHOS: 73 U/L (ref 38–126)
ALT: 19 U/L (ref 14–54)
AST: 30 U/L (ref 15–41)
Albumin: 4.2 g/dL (ref 3.5–5.0)
Bilirubin, Direct: <0.1 mg/dL — ABNORMAL LOW (ref 0.1–0.5)
Total Bilirubin: 0.4 mg/dL (ref 0.3–1.2)
Total Protein: 7.9 g/dL (ref 6.5–8.1)

## 2017-12-13 LAB — LIPASE, BLOOD: Lipase: 75 U/L — ABNORMAL HIGH (ref 11–51)

## 2017-12-13 LAB — I-STAT TROPONIN, ED
Troponin i, poc: 0 ng/mL (ref 0.00–0.08)
Troponin i, poc: 0.01 ng/mL (ref 0.00–0.08)

## 2017-12-13 MED ORDER — IOPAMIDOL (ISOVUE-300) INJECTION 61%
INTRAVENOUS | Status: AC
  Filled 2017-12-13: qty 100

## 2017-12-13 MED ORDER — LEVOFLOXACIN IN D5W 750 MG/150ML IV SOLN
750.0000 mg | Freq: Once | INTRAVENOUS | Status: AC
  Administered 2017-12-13: 750 mg via INTRAVENOUS
  Filled 2017-12-13: qty 150

## 2017-12-13 MED ORDER — ONDANSETRON HCL 4 MG/2ML IJ SOLN
4.0000 mg | Freq: Once | INTRAMUSCULAR | Status: AC
  Administered 2017-12-13: 4 mg via INTRAVENOUS
  Filled 2017-12-13: qty 2

## 2017-12-13 MED ORDER — PANTOPRAZOLE SODIUM 40 MG IV SOLR
40.0000 mg | Freq: Once | INTRAVENOUS | Status: AC
  Administered 2017-12-13: 40 mg via INTRAVENOUS
  Filled 2017-12-13: qty 40

## 2017-12-13 MED ORDER — SODIUM CHLORIDE 0.9 % IV BOLUS (SEPSIS)
1000.0000 mL | Freq: Once | INTRAVENOUS | Status: AC
  Administered 2017-12-13: 1000 mL via INTRAVENOUS

## 2017-12-13 MED ORDER — LEVOFLOXACIN 500 MG PO TABS: 500.0000 mg | ORAL_TABLET | Freq: Every day | ORAL | 0 refills | Status: DC

## 2017-12-13 MED ORDER — ESOMEPRAZOLE MAGNESIUM 40 MG PO CPDR: 40.0000 mg | DELAYED_RELEASE_CAPSULE | Freq: Every day | ORAL | 0 refills | Status: DC

## 2017-12-13 MED ORDER — IOPAMIDOL (ISOVUE-370) INJECTION 76%
INTRAVENOUS | Status: AC
  Administered 2017-12-13: 100 mL
  Filled 2017-12-13: qty 100

## 2017-12-13 NOTE — ED Notes (Signed)
ED Provider at bedside. 

## 2017-12-13 NOTE — ED Notes (Addendum)
Bre, NT at the bedside to attempt to draw blood

## 2017-12-13 NOTE — ED Notes (Signed)
Pt reports R sided chest discomfort that just started approx 5 mins ago. Will notify Dr Darl Householder

## 2017-12-13 NOTE — ED Provider Notes (Signed)
[facility] EMERGENCY DEPARTMENT Provider Note   CSN: [id number] Arrival date & time: 12/13/17  4098     History   Chief Complaint Chief Complaint  Patient presents with  . Abdominal Pain    HPI Jenny Madden is a 77 y.o. female history of hypertension, diabetes, anxiety presenting with epigastric pain, palpitations.  Patient states that her husband died several months ago and she has been anxious since then.  Denies suicidal or homicidal ideations.  She has been on Xanax and tries to quit a couple times most recently about 2 weeks ago.  She states that over the last several days, she has been having some palpitations and was told by her doctor to restart Xanax every other day and slowly taper it.  Since yesterday, patient has been having some epigastric pain and discomfort and nausea.  Patient also has been having some palpitations and she took her blood pressure and the monitor apparently alerted that her heart rate was high but did not give her an exact number.  She denies any recent travel or leg swelling.  She was concerned that she may have some reflux.  Of note, patient did have a mass that was cauterized by IR in the left kidney years ago but has been in remission since then.   The history is provided by the patient.    Past Medical History:  Diagnosis Date  . Allergic rhinitis, cause unspecified   . Anxiety state, unspecified    none recent  . Benign neoplasm of colon   . Chronic kidney disease 01/2016   Tumor left side  . Diverticulosis of colon (without mention of hemorrhage)   . Dysphagia   . Family history of adverse reaction to anesthesia 3 yrs ago   slow to awaken   . Family history of malignant neoplasm of gastrointestinal tract   . Heart murmur   . Hypertension   . Nonspecific (abnormal) findings on radiological and other examination of other intrathoracic organs   . Nonspecific abnormal electrocardiogram (ECG) (EKG)   . Other dysphagia     . Pure hypercholesterolemia   . Type II or unspecified type diabetes mellitus without mention of complication, not stated as uncontrolled   . Unspecified essential hypertension   . Unspecified tinnitus years ago    Patient Active Problem List   Diagnosis Date Noted  . Left renal mass 01/18/2016  . Dysphagia 10/19/2015  . Schatzki's ring 10/19/2015  . Constipation 10/19/2015  . Routine general medical examination at a health care facility 07/13/2015  . Osteopenia 12/27/2011  . Vitamin D deficiency 12/27/2011  . Undiagnosed cardiac murmurs 05/12/2008  . Diabetes mellitus type 2, uncomplicated ([place]) 11/91/4782  . Hyperlipidemia associated with type 2 diabetes mellitus ([place]) 07/29/2007  . Essential hypertension 07/29/2007  . Allergic rhinitis 07/29/2007  . GERD 07/29/2007  . DEGENERATIVE JOINT DISEASE, GENERALIZED 07/29/2007    Past Surgical History:  Procedure Laterality Date  . ABLATION Left 01/2016   Renal mass  . CESAREAN SECTION     x 1  . COLONOSCOPY    . colonscopy and endoscopy  5 yrs ago  . FOOT SURGERY     bilateral for hammer toes  . IR GENERIC HISTORICAL  12/06/2015   IR RADIOLOGIST EVAL & MGMT 12/06/2015 Aletta Edouard, MD [facility] INTERV RAD  . IR GENERIC HISTORICAL  05/03/2016   IR RADIOLOGIST EVAL & MGMT 05/03/2016 Aletta Edouard, MD [facility] INTERV RAD  . IR GENERIC HISTORICAL  02/09/2016  IR RADIOLOGIST EVAL & MGMT 02/09/2016 [facility] INTERV RAD  . IR RADIOLOGIST EVAL & MGMT  01/09/2017  . UPPER GASTROINTESTINAL ENDOSCOPY      OB History    Gravida Para Term Preterm AB Living   '3 1 1   2 1   ' SAB TAB Ectopic Multiple Live Births           1       Home Medications    Prior to Admission medications   Medication Sig Start Date End Date Taking? Authorizing Provider  ACCU-CHEK SOFTCLIX LANCETS lancets Use as directed to check blood glucose at least 3 times a day. E11.09 03/13/16   Hoyt Koch, MD  ALPRAZolam Duanne Moron) 0.5 MG tablet Take 1 tablet (0.5 mg  total) by mouth daily as needed for anxiety. This is a 3 month supply 11/22/17   Hoyt Koch, MD  amLODipine (NORVASC) 10 MG tablet Take 1 tablet (10 mg total) by mouth daily. 11/22/17   Hoyt Koch, MD  aspirin 81 MG tablet Take 81 mg by mouth every morning.     [provider]  Blood Glucose Monitoring Suppl (ACCU-CHEK AVIVA PLUS) w/Device KIT USE AS DIRECTED  TO CHECK BLOOD SUGAR EVERY DAY 09/23/17   Hoyt Koch, MD  Cholecalciferol (VITAMIN D) 2000 UNITS CAPS Take 2,000 Units by mouth daily.     [provider]  fluticasone (FLONASE) 50 MCG/ACT nasal spray Place 2 sprays into both nostrils daily. 01/30/17   Hoyt Koch, MD  glucose blood (ACCU-CHEK AVIVA PLUS) test strip Use to check blood sugars twice a day Dx E11.9 03/13/16   Hoyt Koch, MD  metFORMIN (GLUCOPHAGE) 500 MG tablet Take 1 tablet (500 mg total) by mouth daily with breakfast. 11/22/17   Hoyt Koch, MD  Polyethyl Glycol-Propyl Glycol (SYSTANE OP) Apply 1-2 drops to eye every morning.    [provider]  saccharomyces boulardii (FLORASTOR) 250 MG capsule Take 1 capsule (250 mg total) by mouth daily. 11/07/17   Hoyt Koch, MD  simvastatin (ZOCOR) 40 MG tablet Take 1 tablet (40 mg total) by mouth every evening. 11/22/17   Hoyt Koch, MD  Alum & Mag Hydroxide-Simeth (MAGIC MOUTHWASH) SOLN 1 teaspoon gargle and swallow four times a day   12/27/11  [provider]    Family History Family History  Problem Relation Age of Onset  . Stroke Father   . Lung cancer Father   . Coronary artery disease Father   . Hypertension Mother   . Heart attack Mother   . Colon cancer Brother 34    Social History Social History   Tobacco Use  . Smoking status: Former Smoker    Types: Cigarettes    Last attempt to quit: 10/09/1999    Years since quitting: 18.1  . Smokeless tobacco: Never Used  Substance Use Topics  . Alcohol use: Yes      Alcohol/week: 0.6 oz    Types: 1 Standard drinks or equivalent per week    Comment: social use  . Drug use: No     Allergies   Patient has no known allergies.   Review of Systems Review of Systems  Gastrointestinal: Positive for abdominal pain.  All other systems reviewed and are negative.    Physical Exam Updated Vital Signs BP (!) 114/45   Pulse 84   Temp 98.9 F (37.2 C) (Oral)   Resp (!) 21   Ht 5' 3.75" (1.619 m)  Wt 73.5 kg (162 lb)   SpO2 96%   BMI 28.03 kg/m   Physical Exam  Constitutional: She appears well-developed and well-nourished.  HENT:  Head: Normocephalic.  Mouth/Throat: Oropharynx is clear and moist.  Eyes: EOM are normal. Pupils are equal, round, and reactive to light.  Cardiovascular: Normal rate.  Pulmonary/Chest: Effort normal.  Abdominal: Normal appearance and bowel sounds are normal. There is tenderness in the epigastric area.  Mild epigastric tenderness, no rebound   Neurological: She is alert.  Skin: Skin is warm. Capillary refill takes less than 2 seconds.  Psychiatric: She has a normal mood and affect. Her behavior is normal.  Nursing note and vitals reviewed.    ED Treatments / Results  Labs (all labs ordered are listed, but only abnormal results are displayed) Labs Reviewed  BASIC METABOLIC PANEL - Abnormal; Notable for the following components:      Result Value   Creatinine, Ser 1.02 (*)    GFR calc non Af Amer 52 (*)    GFR calc Af Amer 60 (*)    All other components within normal limits  HEPATIC FUNCTION PANEL - Abnormal; Notable for the following components:   Bilirubin, Direct <0.1 (*)    All other components within normal limits  LIPASE, BLOOD - Abnormal; Notable for the following components:   Lipase 75 (*)    All other components within normal limits  URINALYSIS, ROUTINE W REFLEX MICROSCOPIC - Abnormal; Notable for the following components:   Color, Urine COLORLESS (*)    Specific Gravity, Urine 1.003 (*)     All other components within normal limits  CBC  I-STAT TROPONIN, ED    EKG  EKG Interpretation  Date/Time:  Friday December 13 2017 10:02:20 EST Ventricular Rate:  106 PR Interval:  144 QRS Duration: 74 QT Interval:  334 QTC Calculation: 443 R Axis:   35 Text Interpretation:  Sinus tachycardia Possible Left atrial enlargement Anterior infarct , age undetermined Abnormal ECG Since last tracing rate faster Confirmed by Wandra Arthurs [phone]) on 12/13/2017 12:07:55 PM       Radiology Dg Chest 2 View  Result Date: 12/13/2017 CLINICAL DATA:  Epigastric pain, chest pain EXAM: CHEST - 2 VIEW COMPARISON:  02/20/2017 FINDINGS: Heart and mediastinal contours are within normal limits. No focal opacities or effusions. No acute bony abnormality. IMPRESSION: No active cardiopulmonary disease. Electronically Signed   By: Rolm Baptise M.D.   On: 12/13/2017 10:34    Procedures Procedures (including critical care time)  Medications Ordered in ED Medications  sodium chloride 0.9 % bolus 1,000 mL (1,000 mLs Intravenous New Bag/Given 12/13/17 1318)  pantoprazole (PROTONIX) injection 40 mg (40 mg Intravenous Given 12/13/17 1318)  ondansetron (ZOFRAN) injection 4 mg (4 mg Intravenous Given 12/13/17 1318)  iopamidol (ISOVUE-370) 76 % injection (100 mLs  Contrast Given 12/13/17 1338)     Initial Impression / Assessment and Plan / ED Course  I have reviewed the triage vital signs and the nursing notes.  Pertinent labs & imaging results that were available during my care of the patient were reviewed by me and considered in my medical decision making (see chart for details).     Jenny Madden is a 77 y.o. female here with abdominal pain, palpitations. Borderline tachycardic in the ED, sinus tachycardia and has epigastric pain. Consider PE vs pancreatitis vs cholelithiasis vs xanax withdrawal. Will get labs, CTA chest, CT ab/pel. Will give protonix, pepcid, IVF and reassess.   4:12 PM Labs showed  lipase  75. CT showed no obvious pancreatitis. There is small L sided pneumonia as well. Delta trop neg. Symptoms likely from reflux vs mild gastro vs pneumonia. Given levaquin, protonix, pepcid.   Final Clinical Impressions(s) / ED Diagnoses   Final diagnoses:  None    ED Discharge Orders    None       Drenda Freeze, MD 12/13/17 [phone]

## 2017-12-13 NOTE — Discharge Instructions (Signed)
Take nexium daily for reflux.   Take levaquin daily for a week for pneumonia.   See your doctor next week. Call GI doctor for follow up for reflux.   Stay hydrated   Return to ER if you have worse abdominal pain, chest pain, trouble breathing.

## 2017-12-13 NOTE — ED Notes (Signed)
Pt returned from CT. Ambulatory to restroom with steady gait

## 2017-12-13 NOTE — ED Notes (Signed)
Unsuccessful attempt to draw blood x 1. Pt IV does not pull back blood. Phlebotomy to come attempt to stick patient shortly.

## 2017-12-13 NOTE — ED Notes (Signed)
Patient transported to CT 

## 2017-12-13 NOTE — ED Notes (Signed)
Pt and family updated to delay

## 2017-12-13 NOTE — ED Triage Notes (Signed)
Pt to ER - reports was taking her BP this morning and the monitor was alarming irregular heart rate. Pt reports epigastric discomfort since yesterday. Reports nausea as well. Denies pain. Reports lost her husband in September and "its been down hill since."

## 2017-12-17 ENCOUNTER — Telehealth: Payer: Self-pay

## 2017-12-17 NOTE — Telephone Encounter (Signed)
Copied from [place] [phone]. Topic: General - Other >> Dec 17, 2017  8:47 AM Carolyn Stare wrote:  Pt said hte below med is messing with her stomach. She received this med from the ER for pneumonia. She seens to be taking the med without really eating.. Would like a call back   [phone]    levofloxacin (LEVAQUIN) 500 MG tablet

## 2017-12-17 NOTE — Telephone Encounter (Signed)
I do not see good evidence for pneumonia on her CT scan and would recommend visit so we can talk about if she needs this medication. Stop until visit.

## 2017-12-17 NOTE — Telephone Encounter (Signed)
Can you please make patient an office visit. Thank you  

## 2017-12-18 NOTE — Telephone Encounter (Signed)
Pt has an appointment for tomorrow at 9:30, she is going to stop taking the [employer] [place] because it does not agree with her stomach.

## 2017-12-19 ENCOUNTER — Encounter: Payer: Self-pay | Admitting: Internal Medicine

## 2017-12-19 ENCOUNTER — Ambulatory Visit (INDEPENDENT_AMBULATORY_CARE_PROVIDER_SITE_OTHER): Payer: Medicare HMO | Admitting: Internal Medicine

## 2017-12-19 DIAGNOSIS — R918 Other nonspecific abnormal finding of lung field: Secondary | ICD-10-CM | POA: Diagnosis not present

## 2017-12-19 DIAGNOSIS — K219 Gastro-esophageal reflux disease without esophagitis: Secondary | ICD-10-CM

## 2017-12-19 MED ORDER — OMEPRAZOLE 40 MG PO CPDR: 40.0000 mg | DELAYED_RELEASE_CAPSULE | Freq: Every day | ORAL | 3 refills | Status: DC

## 2017-12-19 NOTE — Assessment & Plan Note (Signed)
Reviewed CT and interpretation and in clinical context I do not believe she had pneumonia. Have asked her to stop levaquin and she has done that. Does not need repeat imaging. CXR was clear and normal.

## 2017-12-19 NOTE — Patient Instructions (Addendum)
We have sent in omeprazole to switch with the nexium that should be cheaper.   We will have you get rid of the rest of the antibiotic as you do not need it.

## 2017-12-19 NOTE — Progress Notes (Signed)
   Subjective:    Patient ID: Jenny Madden, female    DOB: [birth year], 77 y.o.   MRN: [id number]  HPI The patient is a 77 YO female coming in for ER follow up (had some chest pressure and stomach upset and treated for GERD and pneumonia after CT chest and abdomen). She had EKG which was normal and labs which were unrevealing. Reviewed CXR and CT chest with her in the visit and unconvincing evidence for pneumonia on imaging and she had no symptoms of this. She has had stomach pain and dizziness with the levaquin and we advised her to stop yesterday when she called. She had taken 5 days of medicine before she could not tolerate it. She denies cough, palpitations, chest pain. She is having some stomach symptoms and the nexium has helped with that some but was 100+ dollars so she needs an alternative for long term.   Review of Systems  Constitutional: Negative.   HENT: Negative.   Eyes: Negative.   Respiratory: Negative for cough, chest tightness and shortness of breath.   Cardiovascular: Negative for chest pain, palpitations and leg swelling.  Gastrointestinal: Negative for abdominal distention, abdominal pain, constipation, diarrhea, nausea and vomiting.       Stomach upset  Musculoskeletal: Negative.   Skin: Negative.   Neurological: Positive for dizziness.  Psychiatric/Behavioral: Negative.       Objective:   Physical Exam  Constitutional: She is oriented to person, place, and time. She appears well-developed and well-nourished.  HENT:  Head: Normocephalic and atraumatic.  Eyes: EOM are normal.  Neck: Normal range of motion.  Cardiovascular: Normal rate and regular rhythm.  Pulmonary/Chest: Effort normal and breath sounds normal. No respiratory distress. She has no wheezes. She has no rales.  Abdominal: Soft. Bowel sounds are normal. She exhibits no distension. There is no tenderness. There is no rebound.  Musculoskeletal: She exhibits no edema.  Neurological: She is alert and  oriented to person, place, and time. Coordination normal.  Skin: Skin is warm and dry.  Psychiatric: She has a normal mood and affect.   Vitals:   12/19/17 0942  BP: 120/84  Pulse: 81  Temp: 98.5 F (36.9 C)  TempSrc: Oral  SpO2: 98%  Weight: 162 lb (73.5 kg)  Height: 5' 3.75" (1.619 m)      Assessment & Plan:

## 2017-12-19 NOTE — Assessment & Plan Note (Signed)
Change nexium to omeprazole for better coverage. She does have history of ring and dilation which has been some time ago now. She may need another EGD and has visit with GI scheduled already. She will continue PPI until that time.

## 2017-12-20 ENCOUNTER — Ambulatory Visit: Payer: Medicare HMO | Admitting: Gastroenterology

## 2017-12-20 ENCOUNTER — Encounter: Payer: Self-pay | Admitting: Gastroenterology

## 2017-12-20 VITALS — BP 134/60 | HR 84 | Ht 63.0 in | Wt 161.0 lb

## 2017-12-20 DIAGNOSIS — R110 Nausea: Secondary | ICD-10-CM

## 2017-12-20 DIAGNOSIS — R1310 Dysphagia, unspecified: Secondary | ICD-10-CM | POA: Diagnosis not present

## 2017-12-20 NOTE — Progress Notes (Signed)
Review of pertinent gastrointestinal problems: 1. Elevated risk for colon cancer: brother with colon cancer and personal history of adenomatous polyps: 10/2016 Colonoscopy Dr. Ardis Hughs found two subCM adenomas, diverticulosis.  No recall given age 77.  Dysphagia: EGD 10/2011 schatzki's ring and HH (ring dilated to 30m)   HPI: This is a very pleasant 77year old woman whom I last saw about a year ago the time of an colonoscopy.  See those results summarized above.  She is here today with her daughter.  I did note that I saw her about 2 years ago in the office to discuss dysphasia.  I had recommended an upper endoscopy to her than but does not look like she never scheduled it.  Chief complaint is nausea, indigestion especially in the morning.  This been going on for several months. She has some vague dysphasia as well over the same time.\\  She has no abdominal pains  Husband passed in 06/2017 and since that time she has been losing some weight, may be 15-20 pounds.  ROS: complete GI ROS as described in HPI, all other review negative.    Past Medical History:  Diagnosis Date  . Allergic rhinitis, cause unspecified   . Anxiety state, unspecified    none recent  . Benign neoplasm of colon   . Chronic kidney disease 01/2016   Tumor left side  . Diverticulosis of colon (without mention of hemorrhage)   . Dysphagia   . Family history of adverse reaction to anesthesia 3 yrs ago   slow to awaken   . Family history of malignant neoplasm of gastrointestinal tract   . Heart murmur   . Hypertension   . Nonspecific (abnormal) findings on radiological and other examination of other intrathoracic organs   . Nonspecific abnormal electrocardiogram (ECG) (EKG)   . Other dysphagia   . Pure hypercholesterolemia   . Type II or unspecified type diabetes mellitus without mention of complication, not stated as uncontrolled   . Unspecified essential hypertension   . Unspecified tinnitus years ago     Past Surgical History:  Procedure Laterality Date  . ABLATION Left 01/2016   Renal mass  . CESAREAN SECTION     x 1  . COLONOSCOPY    . colonscopy and endoscopy  5 yrs ago  . FOOT SURGERY     bilateral for hammer toes  . IR GENERIC HISTORICAL  12/06/2015   IR RADIOLOGIST EVAL & MGMT 12/06/2015 GAletta Edouard MD [facility] INTERV RAD  . IR GENERIC HISTORICAL  05/03/2016   IR RADIOLOGIST EVAL & MGMT 05/03/2016 GAletta Edouard MD [facility] INTERV RAD  . IR GENERIC HISTORICAL  02/09/2016   IR RADIOLOGIST EVAL & MGMT 02/09/2016 [facility] INTERV RAD  . IR RADIOLOGIST EVAL & MGMT  01/09/2017  . UPPER GASTROINTESTINAL ENDOSCOPY      Current Outpatient Medications  Medication Sig Dispense Refill  . ACCU-CHEK SOFTCLIX LANCETS lancets Use as directed to check blood glucose at least 3 times a day. E11.09 200 each 3  . ALPRAZolam (XANAX) 0.5 MG tablet Take 1 tablet (0.5 mg total) by mouth daily as needed for anxiety. This is a 3 month supply 15 tablet 0  . amLODipine (NORVASC) 10 MG tablet Take 1 tablet (10 mg total) by mouth daily. 90 tablet 2  . aspirin 81 MG tablet Take 81 mg by mouth every morning.     . Blood Glucose Monitoring Suppl (ACCU-CHEK AVIVA PLUS) w/Device KIT USE AS DIRECTED  TO CHECK BLOOD SUGAR EVERY DAY 1  kit 0  . Cholecalciferol (VITAMIN D) 2000 UNITS CAPS Take 2,000 Units by mouth daily.     . fluticasone (FLONASE) 50 MCG/ACT nasal spray Place 2 sprays into both nostrils daily. 48 g 3  . glucose blood (ACCU-CHEK AVIVA PLUS) test strip Use to check blood sugars twice a day Dx E11.9 200 each 3  . metFORMIN (GLUCOPHAGE) 500 MG tablet Take 1 tablet (500 mg total) by mouth daily with breakfast. 90 tablet 2  . omeprazole (PRILOSEC) 40 MG capsule Take 1 capsule (40 mg total) by mouth daily. 90 capsule 3  . Polyethyl Glycol-Propyl Glycol (SYSTANE OP) Apply 1-2 drops to eye every morning.    . saccharomyces boulardii (FLORASTOR) 250 MG capsule Take 1 capsule (250 mg total) by mouth daily. 30  capsule 3  . simvastatin (ZOCOR) 40 MG tablet Take 1 tablet (40 mg total) by mouth every evening. 90 tablet 2   No current facility-administered medications for this visit.     Allergies as of 12/20/2017  . (No Known Allergies)    Family History  Problem Relation Age of Onset  . Stroke Father   . Lung cancer Father   . Coronary artery disease Father   . Hypertension Mother   . Heart attack Mother   . Colon cancer Brother 71    Social History   Socioeconomic History  . Marital status: Married    Spouse name: bob x 37 yrs  . Number of children: 3  . Years of education: Not on file  . Highest education level: Not on file  Social Needs  . Financial resource strain: Not on file  . Food insecurity - worry: Not on file  . Food insecurity - inability: Not on file  . Transportation needs - medical: Not on file  . Transportation needs - non-medical: Not on file  Occupational History  . Occupation: Airline pilot: RETIRED  Tobacco Use  . Smoking status: Former Smoker    Types: Cigarettes    Last attempt to quit: 10/09/1999    Years since quitting: 18.2  . Smokeless tobacco: Never Used  Substance and Sexual Activity  . Alcohol use: Yes    Alcohol/week: 0.6 oz    Types: 1 Standard drinks or equivalent per week    Comment: social use  . Drug use: No  . Sexual activity: No  Other Topics Concern  . Not on file  Social History Narrative   Married to husband, Jenny Madden x37yr   3 children - 1 son w/ Hodgkin's, 2 daughters alive and well   High school education     Physical Exam: BP 134/60 (BP Location: Left Arm, Patient Position: Sitting, Cuff Size: Normal)   Pulse 84   Ht '5\' 3"'  (1.6 m) Comment: height measured without shoes  Wt 161 lb (73 kg)   BMI 28.52 kg/m  Constitutional: generally well-appearing Psychiatric: alert and oriented x3 Abdomen: soft, nontender, nondistended, no obvious ascites, no peritoneal signs, normal bowel sounds No peripheral edema noted in lower  extremities  Assessment and plan: 77y.o. female with morning nausea, dyspepsia, weight loss and mild dysphasia  Think this might be acid related.  She is going to continue her proton pump inhibitor in the morning and start an H2 blocker at bedtime every night especially since a lot of her symptoms are shortly after waking up in the morning.  H2 blockers at bedtime tend to give very good overnight acid control.  I also recommended upper endoscopy even  her mild dysphasia, her weight loss and known history of Schatzki's ring.  Please see the "Patient Instructions" section for addition details about the plan.  Owens Loffler, MD [facility] 12/20/2017, 1:29 PM

## 2017-12-20 NOTE — Patient Instructions (Addendum)
You will be set up for an upper endoscopy for nausea, dysphagia. Continue nexium/omeprazole every morning shortly before breakfast meal. Start ranitidine 150mg  pill, one pill at bedtime nightly.Normal BMI (Body Mass Index- based on height and weight) is between 23 and 30. Your BMI today is Body mass index is 28.52 kg/m. Marland Kitchen Please consider follow up  regarding your BMI with your Primary Care Provider.

## 2017-12-30 ENCOUNTER — Encounter: Payer: Self-pay | Admitting: Gastroenterology

## 2018-01-08 ENCOUNTER — Other Ambulatory Visit (HOSPITAL_COMMUNITY): Payer: Medicare HMO

## 2018-01-10 ENCOUNTER — Encounter: Payer: Self-pay | Admitting: Gastroenterology

## 2018-01-10 ENCOUNTER — Ambulatory Visit (AMBULATORY_SURGERY_CENTER): Payer: Medicare HMO | Admitting: Gastroenterology

## 2018-01-10 ENCOUNTER — Other Ambulatory Visit: Payer: Self-pay

## 2018-01-10 VITALS — BP 134/68 | HR 71 | Temp 98.0°F | Resp 15 | Ht 63.0 in | Wt 161.0 lb

## 2018-01-10 DIAGNOSIS — K2990 Gastroduodenitis, unspecified, without bleeding: Secondary | ICD-10-CM | POA: Diagnosis not present

## 2018-01-10 DIAGNOSIS — K2970 Gastritis, unspecified, without bleeding: Secondary | ICD-10-CM

## 2018-01-10 DIAGNOSIS — R1310 Dysphagia, unspecified: Secondary | ICD-10-CM | POA: Diagnosis not present

## 2018-01-10 DIAGNOSIS — K222 Esophageal obstruction: Secondary | ICD-10-CM | POA: Diagnosis not present

## 2018-01-10 DIAGNOSIS — E669 Obesity, unspecified: Secondary | ICD-10-CM | POA: Diagnosis not present

## 2018-01-10 DIAGNOSIS — R110 Nausea: Secondary | ICD-10-CM | POA: Diagnosis not present

## 2018-01-10 DIAGNOSIS — E119 Type 2 diabetes mellitus without complications: Secondary | ICD-10-CM | POA: Diagnosis not present

## 2018-01-10 DIAGNOSIS — K2950 Unspecified chronic gastritis without bleeding: Secondary | ICD-10-CM | POA: Diagnosis not present

## 2018-01-10 DIAGNOSIS — I10 Essential (primary) hypertension: Secondary | ICD-10-CM | POA: Diagnosis not present

## 2018-01-10 DIAGNOSIS — K219 Gastro-esophageal reflux disease without esophagitis: Secondary | ICD-10-CM | POA: Diagnosis not present

## 2018-01-10 MED ORDER — SODIUM CHLORIDE 0.9 % IV SOLN: 500.0000 mL | Freq: Once | INTRAVENOUS | Status: DC

## 2018-01-10 NOTE — Patient Instructions (Signed)
Handout given: Gastritis.  YOU HAD AN ENDOSCOPIC PROCEDURE TODAY AT [facility]:   Refer to the procedure report that was given to you for any specific questions about what was found during the examination.  If the procedure report does not answer your questions, please call your gastroenterologist to clarify.  If you requested that your care partner not be given the details of your procedure findings, then the procedure report has been included in a sealed envelope for you to review at your convenience later.  YOU SHOULD EXPECT: Some feelings of bloating in the abdomen. Passage of more gas than usual.  Walking can help get rid of the air that was put into your GI tract during the procedure and reduce the bloating. If you had a lower endoscopy (such as a colonoscopy or flexible sigmoidoscopy) you may notice spotting of blood in your stool or on the toilet paper. If you underwent a bowel prep for your procedure, you may not have a normal bowel movement for a few days.  Please Note:  You might notice some irritation and congestion in your nose or some drainage.  This is from the oxygen used during your procedure.  There is no need for concern and it should clear up in a day or so.  SYMPTOMS TO REPORT IMMEDIATELY:    Following upper endoscopy (EGD)  Vomiting of blood or coffee ground material  New chest pain or pain under the shoulder blades  Painful or persistently difficult swallowing  New shortness of breath  Fever of 100F or higher  Black, tarry-looking stools  For urgent or emergent issues, a gastroenterologist can be reached at any hour by calling [phone].   DIET:  We do recommend a small meal at first, but then you may proceed to your regular diet.  Drink plenty of fluids but you should avoid alcoholic beverages for 24 hours.  ACTIVITY:  You should plan to take it easy for the rest of today and you should NOT DRIVE or use heavy machinery until tomorrow (because  of the sedation medicines used during the test).    FOLLOW UP: Our staff will call the number listed on your records the next business day following your procedure to check on you and address any questions or concerns that you may have regarding the information given to you following your procedure. If we do not reach you, we will leave a message.  However, if you are feeling well and you are not experiencing any problems, there is no need to return our call.  We will assume that you have returned to your regular daily activities without incident.  If any biopsies were taken you will be contacted by phone or by letter within the next 1-3 weeks.  Please call us at [phone] if you have not heard about the biopsies in 3 weeks.    SIGNATURES/CONFIDENTIALITY: You and/or your care partner have signed paperwork which will be entered into your electronic medical record.  These signatures attest to the fact that that the information above on your After Visit Summary has been reviewed and is understood.  Full responsibility of the confidentiality of this discharge information lies with you and/or your care-partner.

## 2018-01-10 NOTE — Progress Notes (Signed)
Report to PACU, RN, vss, BBS= Clear.  

## 2018-01-10 NOTE — Op Note (Signed)
[facility] Patient Name: Jenny Madden Procedure Date: 01/10/2018 1:33 PM MRN: [id number] Endoscopist: Milus Banister , MD Age: 77 Referring MD:  Date of Birth: 01/24/1941 Gender: Female Account #: [id number] Procedure:                Upper GI endoscopy Indications:              Dyspepsia, Dysphagia, h/o Schatzki's ring Medicines:                Monitored Anesthesia Care Procedure:                Pre-Anesthesia Assessment:                           - Prior to the procedure, a History and Physical                            was performed, and patient medications and                            allergies were reviewed. The patient's tolerance of                            previous anesthesia was also reviewed. The risks                            and benefits of the procedure and the sedation                            options and risks were discussed with the patient.                            All questions were answered, and informed consent                            was obtained. Prior Anticoagulants: The patient has                            taken no previous anticoagulant or antiplatelet                            agents. ASA Grade Assessment: II - A patient with                            mild systemic disease. After reviewing the risks                            and benefits, the patient was deemed in                            satisfactory condition to undergo the procedure.                           After obtaining informed consent, the endoscope was  passed under direct vision. Throughout the                            procedure, the patient's blood pressure, pulse, and                            oxygen saturations were monitored continuously. The                            Endoscope was introduced through the mouth, and                            advanced to the second part of duodenum. The upper                            GI endoscopy  was accomplished without difficulty.                            The patient tolerated the procedure well. Scope In: Scope Out: Findings:                 One benign-appearing, intrinsic mild stenosis (thin                            Schatzki's type ring) was found at the                            gastroesophageal junction. A TTS dilator was passed                            through the scope. Dilation with an 18-19-20 mm                            balloon dilator was performed to 20 mm held                            inflated for one minute..                           Mild inflammation characterized by linear erythema                            was found in the gastric antrum. Biopsies were                            taken with a cold forceps for histology.                           The exam was otherwise without abnormality. Complications:            No immediate complications. Estimated blood loss:                            None. Estimated Blood Loss:     Estimated blood loss: none. Impression:               -  Thin Schatzkis' ring, dilated to 70mm.                           - Mild gastritis, biopsied.                           - The examination was otherwise normal. Recommendation:           - Patient has a contact number available for                            emergencies. The signs and symptoms of potential                            delayed complications were discussed with the                            patient. Return to normal activities tomorrow.                            Written discharge instructions were provided to the                            patient.                           - Resume previous diet.                           - Continue present medications.                           - Await pathology results. Milus Banister, MD 01/10/2018 1:49:48 PM This report has been signed electronically.

## 2018-01-10 NOTE — Progress Notes (Signed)
Called to room to assist during endoscopic procedure.  Patient ID and intended procedure confirmed with present staff. Received instructions for my participation in the procedure from the performing physician.  

## 2018-01-13 ENCOUNTER — Telehealth: Payer: Self-pay

## 2018-01-13 NOTE — Telephone Encounter (Signed)
  Follow up Call-  Call Jenny Madden number 01/10/2018 11/07/2016  Post procedure Call Jenny Madden phone  # [phone]  Permission to leave phone message Yes Yes  Some recent data might be hidden     Patient questions:  Do you have a fever, pain , or abdominal swelling? No. Pain Score  0 *  Have you tolerated food without any problems? Yes.    Have you been able to return to your normal activities? Yes.    Do you have any questions about your discharge instructions: Diet   No. Medications  No. Follow up visit  No.  Do you have questions or concerns about your Care? No.  Actions: * If pain score is 4 or above: No action needed, pain <4.

## 2018-01-14 ENCOUNTER — Ambulatory Visit
Admission: RE | Admit: 2018-01-14 | Discharge: 2018-01-14 | Disposition: A | Discharge status: Home / Self Care | Payer: Medicare HMO | Source: Ambulatory Visit | Attending: Interventional Radiology | Admitting: Interventional Radiology

## 2018-01-14 DIAGNOSIS — N2889 Other specified disorders of kidney and ureter: Secondary | ICD-10-CM

## 2018-01-14 HISTORY — PX: IR RADIOLOGIST EVAL & MGMT: IMG5224

## 2018-01-14 NOTE — Progress Notes (Signed)
Chief Complaint: Status post cryoablation of a left renal neoplasm on 01/18/2016.  History of Present Illness: Jenny Madden is a 77 y.o. female 2 years post cryoablation of a 1.6 cm left renal neoplasm.  She is currently asymptomatic.  Past Medical History:  Diagnosis Date  . Allergic rhinitis, cause unspecified   . Anxiety state, unspecified    none recent  . Benign neoplasm of colon   . Cancer ([place])    renal mass  . Cataract   . Chronic kidney disease 01/2016   Tumor left side  . Diverticulosis of colon (without mention of hemorrhage)   . Dysphagia   . Family history of adverse reaction to anesthesia 3 yrs ago   slow to awaken   . Family history of malignant neoplasm of gastrointestinal tract   . Heart murmur   . Hypertension   . Nonspecific (abnormal) findings on radiological and other examination of other intrathoracic organs   . Nonspecific abnormal electrocardiogram (ECG) (EKG)   . Osteoporosis   . Other dysphagia   . Pure hypercholesterolemia   . Type II or unspecified type diabetes mellitus without mention of complication, not stated as uncontrolled   . Unspecified essential hypertension   . Unspecified tinnitus years ago    Past Surgical History:  Procedure Laterality Date  . ABLATION Left 01/2016   Renal mass  . CESAREAN SECTION     x 1  . COLONOSCOPY    . colonscopy and endoscopy  5 yrs ago  . FOOT SURGERY     bilateral for hammer toes  . IR GENERIC HISTORICAL  12/06/2015   IR RADIOLOGIST EVAL & MGMT 12/06/2015 Aletta Edouard, MD [facility] INTERV RAD  . IR GENERIC HISTORICAL  05/03/2016   IR RADIOLOGIST EVAL & MGMT 05/03/2016 Aletta Edouard, MD [facility] INTERV RAD  . IR GENERIC HISTORICAL  02/09/2016   IR RADIOLOGIST EVAL & MGMT 02/09/2016 [facility] INTERV RAD  . IR RADIOLOGIST EVAL & MGMT  01/09/2017  . IR RADIOLOGIST EVAL & MGMT  01/14/2018  . UPPER GASTROINTESTINAL ENDOSCOPY      Allergies: Patient has no known allergies.  Medications: Prior to  Admission medications   Medication Sig Start Date End Date Taking? Authorizing Provider  ACCU-CHEK SOFTCLIX LANCETS lancets Use as directed to check blood glucose at least 3 times a day. E11.09 03/13/16   Hoyt Koch, MD  ALPRAZolam Duanne Moron) 0.5 MG tablet Take 1 tablet (0.5 mg total) by mouth daily as needed for anxiety. This is a 3 month supply 11/22/17   Hoyt Koch, MD  amLODipine (NORVASC) 10 MG tablet Take 1 tablet (10 mg total) by mouth daily. 11/22/17   Hoyt Koch, MD  aspirin 81 MG tablet Take 81 mg by mouth every morning.     [provider]  Blood Glucose Monitoring Suppl (ACCU-CHEK AVIVA PLUS) w/Device KIT USE AS DIRECTED  TO CHECK BLOOD SUGAR EVERY DAY 09/23/17   Hoyt Koch, MD  Cholecalciferol (VITAMIN D) 2000 UNITS CAPS Take 2,000 Units by mouth daily.     [provider]  fluticasone (FLONASE) 50 MCG/ACT nasal spray Place 2 sprays into both nostrils daily. 01/30/17   Hoyt Koch, MD  glucose blood (ACCU-CHEK AVIVA PLUS) test strip Use to check blood sugars twice a day Dx E11.9 03/13/16   Hoyt Koch, MD  metFORMIN (GLUCOPHAGE) 500 MG tablet Take 1 tablet (500 mg total) by mouth daily with breakfast. 11/22/17   Hoyt Koch, MD  omeprazole (PRILOSEC) 40 MG capsule Take 1 capsule (40 mg total) by mouth daily. 12/19/17   Hoyt Koch, MD  Polyethyl Glycol-Propyl Glycol (SYSTANE OP) Apply 1-2 drops to eye every morning.    [provider]  simvastatin (ZOCOR) 40 MG tablet Take 1 tablet (40 mg total) by mouth every evening. 11/22/17   Hoyt Koch, MD  Alum & Mag Hydroxide-Simeth (MAGIC MOUTHWASH) SOLN 1 teaspoon gargle and swallow four times a day   12/27/11  [provider]     Family History  Problem Relation Age of Onset  . Stroke Father   . Lung cancer Father   . Coronary artery disease Father   . Hypertension Mother   . Heart attack Mother   . Colon cancer Brother  54  . Esophageal cancer Neg Hx   . Rectal cancer Neg Hx   . Stomach cancer Neg Hx     Social History   Socioeconomic History  . Marital status: Married    Spouse name: bob x 37 yrs  . Number of children: 3  . Years of education: Not on file  . Highest education level: Not on file  Occupational History  . Occupation: Airline pilot: RETIRED  Social Needs  . Financial resource strain: Not on file  . Food insecurity:    Worry: Not on file    Inability: Not on file  . Transportation needs:    Medical: Not on file    Non-medical: Not on file  Tobacco Use  . Smoking status: Former Smoker    Types: Cigarettes    Last attempt to quit: 10/09/1999    Years since quitting: 18.2  . Smokeless tobacco: Never Used  Substance and Sexual Activity  . Alcohol use: Yes    Alcohol/week: 0.6 oz    Types: 1 Standard drinks or equivalent per week    Comment: social use  . Drug use: No  . Sexual activity: Never  Lifestyle  . Physical activity:    Days per week: Not on file    Minutes per session: Not on file  . Stress: Not on file  Relationships  . Social connections:    Talks on phone: Not on file    Gets together: Not on file    Attends religious service: Not on file    Active member of club or organization: Not on file    Attends meetings of clubs or organizations: Not on file    Relationship status: Not on file  Other Topics Concern  . Not on file  Social History Narrative   Married to husband, [name] x77yr   3 children - 1 son w/ Hodgkin's, 2 daughters alive and well   High school education    ECOG Status: 0 - Asymptomatic  Review of Systems: A 12 point ROS discussed and pertinent positives are indicated in the HPI above.  All other systems are negative.  Review of Systems  Constitutional: Negative.   HENT: Negative.   Respiratory: Negative.   Cardiovascular: Negative.   Gastrointestinal: Negative.   Genitourinary: Negative.   Musculoskeletal: Negative.     Neurological: Negative.     Vital Signs: BP 121/67 (BP Location: Right Arm, Patient Position: Sitting, Cuff Size: Normal)   Pulse 78   Temp 99.4 F (37.4 C)   Resp 16   SpO2 98%   Physical Exam  Constitutional: She is oriented to person, place, and time. She appears well-developed and well-nourished. No distress.  Abdominal:  Soft. She exhibits no distension. There is no tenderness. There is no rebound and no guarding.  Neurological: She is alert and oriented to person, place, and time.  Skin: She is not diaphoretic.  Vitals reviewed.   Imaging: Ir Radiologist Eval & Mgmt  Result Date: 01/14/2018 Please refer to notes tab for details about interventional procedure. (Op Note)   Labs:  CBC: Recent Labs    09/03/17 0929 12/13/17 1014  WBC 5.7 7.6  HGB 13.6 13.6  HCT 42.5 43.6  PLT 254.0 292    COAGS: No results for input(s): INR, APTT in the last 8760 hours.  BMP: Recent Labs    09/03/17 0929 12/13/17 1014  NA 140 141  K 3.8 3.9  CL 105 107  CO2 27 23  GLUCOSE 113* 93  BUN 11 10  CALCIUM 10.0 9.7  CREATININE 0.87 1.02*  GFRNONAA  --  52*  GFRAA  --  60*    LIVER FUNCTION TESTS: Recent Labs    09/03/17 0929 12/13/17 1210  BILITOT 0.4 0.4  AST 22 30  ALT 14 19  ALKPHOS 69 73  PROT 7.5 7.9  ALBUMIN 4.2 4.2    Assessment and Plan:  A CT of the abdomen was performed on 12/13/2017 at the time of evaluation for epigastric discomfort, irregular heart rate and abdominal distention.  This was reviewed and shows focal scarring at the level of prior left medial renal cryoablation with slightly less prominent scar tissue compared to the prior study on 01/08/2017.  There is no evidence of abnormal enhancement to suggest recurrent tumor.  No new suspicious renal lesions are identified.  I reviewed imaging findings with Mrs. Lodato.  I recommended a follow-up CT of the abdomen in 1 year.  Electronically SignedAletta Edouard T 01/14/2018, 11:12 AM   I spent a  total of 15 Minutes in face to face in clinical consultation, greater than 50% of which was counseling/coordinating care post cryoablation of a left renal mass.

## 2018-01-16 ENCOUNTER — Ambulatory Visit (INDEPENDENT_AMBULATORY_CARE_PROVIDER_SITE_OTHER): Payer: Medicare HMO | Admitting: Obstetrics & Gynecology

## 2018-01-16 ENCOUNTER — Encounter: Payer: Self-pay | Admitting: Obstetrics & Gynecology

## 2018-01-16 VITALS — BP 130/76 | Ht 63.0 in | Wt 156.0 lb

## 2018-01-16 DIAGNOSIS — Z124 Encounter for screening for malignant neoplasm of cervix: Secondary | ICD-10-CM | POA: Diagnosis not present

## 2018-01-16 DIAGNOSIS — Z780 Asymptomatic menopausal state: Secondary | ICD-10-CM

## 2018-01-16 DIAGNOSIS — Z01411 Encounter for gynecological examination (general) (routine) with abnormal findings: Secondary | ICD-10-CM

## 2018-01-16 DIAGNOSIS — M8589 Other specified disorders of bone density and structure, multiple sites: Secondary | ICD-10-CM

## 2018-01-16 NOTE — Addendum Note (Signed)
Addended by: Thurnell Garbe A on: 01/16/2018 11:09 AM   Modules accepted: Orders

## 2018-01-16 NOTE — Progress Notes (Signed)
Jenny Madden 1941-03-27 [id number]   History:    77 y.o. G3P1A2L1 Widowed.  RP:  Established patient presenting for annual gyn exam   HPI: Menopause, well on no hormone replacement therapy.  No postmenopausal bleeding.  No pelvic pain.  Abstinent.  Urine and bowel movements normal.  Breasts normal.  Body mass index 27.63.  Walking.  Health labs with family physician.  Past medical history,surgical history, family history and social history were all reviewed and documented in the EPIC chart.  Gynecologic History No LMP recorded. Patient is postmenopausal. Contraception: post menopausal status Last Pap: 2012. Results were: Negative Last mammogram: 08/2017. Results were: Negative Bone Density: 09/2017 Osteopenia.  Lowest T-Score -2.1. Colonoscopy: 2018  Obstetric History OB History  Gravida Para Term Preterm AB Living  3 1 1   2 1   SAB TAB Ectopic Multiple Live Births          1    # Outcome Date GA Lbr Len/2nd Weight Sex Delivery Anes PTL Lv  3 AB           2 AB           1 Term     F CS-Unspec  N LIV     ROS: A ROS was performed and pertinent positives and negatives are included in the history.  GENERAL: No fevers or chills. HEENT: No change in vision, no earache, sore throat or sinus congestion. NECK: No pain or stiffness. CARDIOVASCULAR: No chest pain or pressure. No palpitations. PULMONARY: No shortness of breath, cough or wheeze. GASTROINTESTINAL: No abdominal pain, nausea, vomiting or diarrhea, melena or bright red blood per rectum. GENITOURINARY: No urinary frequency, urgency, hesitancy or dysuria. MUSCULOSKELETAL: No joint or muscle pain, no back pain, no recent trauma. DERMATOLOGIC: No rash, no itching, no lesions. ENDOCRINE: No polyuria, polydipsia, no heat or cold intolerance. No recent change in weight. HEMATOLOGICAL: No anemia or easy bruising or bleeding. NEUROLOGIC: No headache, seizures, numbness, tingling or weakness. PSYCHIATRIC: No depression, no loss of  interest in normal activity or change in sleep pattern.     Exam:   BP 130/76   Ht 5\' 3"  (1.6 m)   Wt 156 lb (70.8 kg)   BMI 27.63 kg/m   Body mass index is 27.63 kg/m.  General appearance : Well developed well nourished female. No acute distress HEENT: Eyes: no retinal hemorrhage or exudates,  Neck supple, trachea midline, no carotid bruits, no thyroidmegaly Lungs: Clear to auscultation, no rhonchi or wheezes, or rib retractions  Heart: Regular rate and rhythm, no murmurs or gallops Breast:Examined in sitting and supine position were symmetrical in appearance, no palpable masses or tenderness,  no skin retraction, no nipple inversion, no nipple discharge, no skin discoloration, no axillary or supraclavicular lymphadenopathy Abdomen: no palpable masses or tenderness, no rebound or guarding Extremities: no edema or skin discoloration or tenderness  Pelvic: Vulva: Normal             Vagina: No gross lesions or discharge  Cervix: No gross lesions or discharge.  Pap reflex done.  Uterus  AV, normal size, shape and consistency, non-tender and mobile  Adnexa  Without masses or tenderness  Anus: Normal   Assessment/Plan:  77 y.o. female for annual exam   1. Encounter for gynecological examination with abnormal finding Gynecologic exam with atrophy of menopause.  Pap reflex done.  Will be the last one if negative.  Breast exam normal.  Screening mammogram was negative in November 2018.  Colonoscopy in 2018.  Health labs with family physician.  2. Menopause present Well on no hormone replacement therapy.  No postmenopausal bleeding.  3. Osteopenia of multiple sites Recommend vitamin D supplements, calcium rich nutrition and regular weightbearing physical activity.  Last bone density in December 2018 showed osteopenia with a T score of -2.1.  Will repeat bone density in December 2020.  Princess Bruins MD, 10:39 AM 01/16/2018

## 2018-01-16 NOTE — Patient Instructions (Signed)
1. Encounter for gynecological examination with abnormal finding Gynecologic exam with atrophy of menopause.  Pap reflex done.  Will be the last one if negative.  Breast exam normal.  Screening mammogram was negative in November 2018.  Colonoscopy in 2018.  Health labs with family physician.  2. Menopause present Well on no hormone replacement therapy.  No postmenopausal bleeding.  3. Osteopenia of multiple sites Recommend vitamin D supplements, calcium rich nutrition and regular weightbearing physical activity.  Last bone density in December 2018 showed osteopenia with a T score of -2.1.  Will repeat bone density in December 2020.  Jenny Madden, it was a pleasure meeting you today!  I will inform you of your results as soon as they are available.   Health Maintenance for Postmenopausal Women Menopause is a normal process in which your reproductive ability comes to an end. This process happens gradually over a span of months to years, usually between the ages of 63 and 76. Menopause is complete when you have missed 12 consecutive menstrual periods. It is important to talk with your health care provider about some of the most common conditions that affect postmenopausal women, such as heart disease, cancer, and bone loss (osteoporosis). Adopting a healthy lifestyle and getting preventive care can help to promote your health and wellness. Those actions can also lower your chances of developing some of these common conditions. What should I know about menopause? During menopause, you may experience a number of symptoms, such as:  Moderate-to-severe hot flashes.  Night sweats.  Decrease in sex drive.  Mood swings.  Headaches.  Tiredness.  Irritability.  Memory problems.  Insomnia.  Choosing to treat or not to treat menopausal changes is an individual decision that you make with your health care provider. What should I know about hormone replacement therapy and supplements? Hormone  therapy products are effective for treating symptoms that are associated with menopause, such as hot flashes and night sweats. Hormone replacement carries certain risks, especially as you become older. If you are thinking about using estrogen or estrogen with progestin treatments, discuss the benefits and risks with your health care provider. What should I know about heart disease and stroke? Heart disease, heart attack, and stroke become more likely as you age. This may be due, in part, to the hormonal changes that your body experiences during menopause. These can affect how your body processes dietary fats, triglycerides, and cholesterol. Heart attack and stroke are both medical emergencies. There are many things that you can do to help prevent heart disease and stroke:  Have your blood pressure checked at least every 1-2 years. High blood pressure causes heart disease and increases the risk of stroke.  If you are 22-67 years old, ask your health care provider if you should take aspirin to prevent a heart attack or a stroke.  Do not use any tobacco products, including cigarettes, chewing tobacco, or electronic cigarettes. If you need help quitting, ask your health care provider.  It is important to eat a healthy diet and maintain a healthy weight. ? Be sure to include plenty of vegetables, fruits, low-fat dairy products, and lean protein. ? Avoid eating foods that are high in solid fats, added sugars, or salt (sodium).  Get regular exercise. This is one of the most important things that you can do for your health. ? Try to exercise for at least 150 minutes each week. The type of exercise that you do should increase your heart rate and make you sweat. This  is known as moderate-intensity exercise. ? Try to do strengthening exercises at least twice each week. Do these in addition to the moderate-intensity exercise.  Know your numbers.Ask your health care provider to check your cholesterol and your  blood glucose. Continue to have your blood tested as directed by your health care provider.  What should I know about cancer screening? There are several types of cancer. Take the following steps to reduce your risk and to catch any cancer development as early as possible. Breast Cancer  Practice breast self-awareness. ? This means understanding how your breasts normally appear and feel. ? It also means doing regular breast self-exams. Let your health care provider know about any changes, no matter how small.  If you are 57 or older, have a clinician do a breast exam (clinical breast exam or CBE) every year. Depending on your age, family history, and medical history, it may be recommended that you also have a yearly breast X-ray (mammogram).  If you have a family history of breast cancer, talk with your health care provider about genetic screening.  If you are at high risk for breast cancer, talk with your health care provider about having an MRI and a mammogram every year.  Breast cancer (BRCA) gene test is recommended for women who have family members with BRCA-related cancers. Results of the assessment will determine the need for genetic counseling and BRCA1 and for BRCA2 testing. BRCA-related cancers include these types: ? Breast. This occurs in males or females. ? Ovarian. ? Tubal. This may also be called fallopian tube cancer. ? Cancer of the abdominal or pelvic lining (peritoneal cancer). ? Prostate. ? Pancreatic.  Cervical, Uterine, and Ovarian Cancer Your health care provider may recommend that you be screened regularly for cancer of the pelvic organs. These include your ovaries, uterus, and vagina. This screening involves a pelvic exam, which includes checking for microscopic changes to the surface of your cervix (Pap test).  For women ages 21-65, health care providers may recommend a pelvic exam and a Pap test every three years. For women ages 80-65, they may recommend the Pap  test and pelvic exam, combined with testing for human papilloma virus (HPV), every five years. Some types of HPV increase your risk of cervical cancer. Testing for HPV may also be done on women of any age who have unclear Pap test results.  Other health care providers may not recommend any screening for nonpregnant women who are considered low risk for pelvic cancer and have no symptoms. Ask your health care provider if a screening pelvic exam is right for you.  If you have had past treatment for cervical cancer or a condition that could lead to cancer, you need Pap tests and screening for cancer for at least 20 years after your treatment. If Pap tests have been discontinued for you, your risk factors (such as having a new sexual partner) need to be reassessed to determine if you should start having screenings again. Some women have medical problems that increase the chance of getting cervical cancer. In these cases, your health care provider may recommend that you have screening and Pap tests more often.  If you have a family history of uterine cancer or ovarian cancer, talk with your health care provider about genetic screening.  If you have vaginal bleeding after reaching menopause, tell your health care provider.  There are currently no reliable tests available to screen for ovarian cancer.  Lung Cancer Lung cancer screening is recommended for  adults 85-10 years old who are at high risk for lung cancer because of a history of smoking. A yearly low-dose CT scan of the lungs is recommended if you:  Currently smoke.  Have a history of at least 30 pack-years of smoking and you currently smoke or have quit within the past 15 years. A pack-year is smoking an average of one pack of cigarettes per day for one year.  Yearly screening should:  Continue until it has been 15 years since you quit.  Stop if you develop a health problem that would prevent you from having lung cancer  treatment.  Colorectal Cancer  This type of cancer can be detected and can often be prevented.  Routine colorectal cancer screening usually begins at age 52 and continues through age 74.  If you have risk factors for colon cancer, your health care provider may recommend that you be screened at an earlier age.  If you have a family history of colorectal cancer, talk with your health care provider about genetic screening.  Your health care provider may also recommend using home test kits to check for hidden blood in your stool.  A small camera at the end of a tube can be used to examine your colon directly (sigmoidoscopy or colonoscopy). This is done to check for the earliest forms of colorectal cancer.  Direct examination of the colon should be repeated every 5-10 years until age 52. However, if early forms of precancerous polyps or small growths are found or if you have a family history or genetic risk for colorectal cancer, you may need to be screened more often.  Skin Cancer  Check your skin from head to toe regularly.  Monitor any moles. Be sure to tell your health care provider: ? About any new moles or changes in moles, especially if there is a change in a mole's shape or color. ? If you have a mole that is larger than the size of a pencil eraser.  If any of your family members has a history of skin cancer, especially at a young age, talk with your health care provider about genetic screening.  Always use sunscreen. Apply sunscreen liberally and repeatedly throughout the day.  Whenever you are outside, protect yourself by wearing long sleeves, pants, a wide-brimmed hat, and sunglasses.  What should I know about osteoporosis? Osteoporosis is a condition in which bone destruction happens more quickly than new bone creation. After menopause, you may be at an increased risk for osteoporosis. To help prevent osteoporosis or the bone fractures that can happen because of osteoporosis,  the following is recommended:  If you are 48-40 years old, get at least 1,000 mg of calcium and at least 600 mg of vitamin D per day.  If you are older than age 33 but younger than age 16, get at least 1,200 mg of calcium and at least 600 mg of vitamin D per day.  If you are older than age 8, get at least 1,200 mg of calcium and at least 800 mg of vitamin D per day.  Smoking and excessive alcohol intake increase the risk of osteoporosis. Eat foods that are rich in calcium and vitamin D, and do weight-bearing exercises several times each week as directed by your health care provider. What should I know about how menopause affects my mental health? Depression may occur at any age, but it is more common as you become older. Common symptoms of depression include:  Low or sad mood.  Changes in sleep patterns.  Changes in appetite or eating patterns.  Feeling an overall lack of motivation or enjoyment of activities that you previously enjoyed.  Frequent crying spells.  Talk with your health care provider if you think that you are experiencing depression. What should I know about immunizations? It is important that you get and maintain your immunizations. These include:  Tetanus, diphtheria, and pertussis (Tdap) booster vaccine.  Influenza every year before the flu season begins.  Pneumonia vaccine.  Shingles vaccine.  Your health care provider may also recommend other immunizations. This information is not intended to replace advice given to you by your health care provider. Make sure you discuss any questions you have with your health care provider. Document Released: 11/16/2005 Document Revised: 04/13/2016 Document Reviewed: 06/28/2015 Elsevier Interactive Patient Education  2018 [employer].

## 2018-01-17 LAB — PAP IG W/ RFLX HPV ASCU

## 2018-01-20 ENCOUNTER — Telehealth: Payer: Self-pay | Admitting: Internal Medicine

## 2018-01-20 ENCOUNTER — Encounter: Payer: Self-pay | Admitting: Gastroenterology

## 2018-01-20 ENCOUNTER — Encounter: Payer: Self-pay | Admitting: *Deleted

## 2018-01-20 NOTE — Telephone Encounter (Signed)
Patient informed of MD response and stated awareness

## 2018-01-20 NOTE — Telephone Encounter (Signed)
Okay to do, come back in 3 months after stopping.

## 2018-01-20 NOTE — Telephone Encounter (Signed)
Copied from [place] [phone]. Topic: Quick Communication - See Telephone Encounter >> Jan 20, 2018  1:13 PM Boyd Kerbs wrote: CRM for notification.   Pt. Is asking about blood sugar has been good.  She is taking one Metformin and then her level is low.  (goes down to at least 70). Asking if she can go off and see how is does.  She has lost weight

## 2018-02-04 ENCOUNTER — Encounter: Payer: Medicare HMO | Admitting: Gastroenterology

## 2018-02-26 DIAGNOSIS — D49512 Neoplasm of unspecified behavior of left kidney: Secondary | ICD-10-CM | POA: Diagnosis not present

## 2018-03-04 ENCOUNTER — Ambulatory Visit (INDEPENDENT_AMBULATORY_CARE_PROVIDER_SITE_OTHER): Payer: Medicare HMO | Admitting: Internal Medicine

## 2018-03-04 ENCOUNTER — Other Ambulatory Visit (INDEPENDENT_AMBULATORY_CARE_PROVIDER_SITE_OTHER): Payer: Medicare HMO

## 2018-03-04 ENCOUNTER — Encounter: Payer: Self-pay | Admitting: Internal Medicine

## 2018-03-04 VITALS — BP 116/70 | HR 84 | Temp 98.7°F | Ht 63.0 in | Wt 159.0 lb

## 2018-03-04 DIAGNOSIS — E119 Type 2 diabetes mellitus without complications: Secondary | ICD-10-CM

## 2018-03-04 DIAGNOSIS — R69 Illness, unspecified: Secondary | ICD-10-CM | POA: Diagnosis not present

## 2018-03-04 DIAGNOSIS — R011 Cardiac murmur, unspecified: Secondary | ICD-10-CM

## 2018-03-04 LAB — HEMOGLOBIN A1C: HEMOGLOBIN A1C: 6.1 % (ref 4.6–6.5)

## 2018-03-04 MED ORDER — ACCU-CHEK SOFTCLIX LANCETS MISC: 3 refills | Status: DC

## 2018-03-04 MED ORDER — GLUCOSE BLOOD VI STRP: ORAL_STRIP | 3 refills | Status: DC

## 2018-03-04 NOTE — Progress Notes (Signed)
   Subjective:    Patient ID: Jenny Madden, female    DOB: Mar 04, 1941, 77 y.o.   MRN: [id number]  HPI The patient is a 77 YO female coming in for follow up of diabetes. She was advised she could stop metformin and she did for a few days but wanted to resume taking it. She is still taking 1 pill daily. She feels well overall. She is doing well since EGD without any more GERD symptoms. Denies chest pains, pressure. She denies palpitations. She states that her gynecologist asked about her murmur. This has been evaluated with echo in the past which did not reveal discrete cause but likely flow murmur which requires no further evaluation as it has been unchanged on exam.   Review of Systems  Constitutional: Negative.   HENT: Negative.   Eyes: Negative.   Respiratory: Negative for cough, chest tightness and shortness of breath.   Cardiovascular: Negative for chest pain, palpitations and leg swelling.  Gastrointestinal: Negative for abdominal distention, abdominal pain, constipation, diarrhea, nausea and vomiting.  Musculoskeletal: Negative.   Skin: Negative.   Neurological: Negative.   Psychiatric/Behavioral: Negative.       Objective:   Physical Exam  Constitutional: She is oriented to person, place, and time. She appears well-developed and well-nourished.  HENT:  Head: Normocephalic and atraumatic.  Eyes: EOM are normal.  Neck: Normal range of motion.  Cardiovascular: Normal rate and regular rhythm.  Faint murmur systolic  Pulmonary/Chest: Effort normal and breath sounds normal. No respiratory distress. She has no wheezes. She has no rales.  Abdominal: Soft. Bowel sounds are normal. She exhibits no distension. There is no tenderness. There is no rebound.  Musculoskeletal: She exhibits no edema.  Neurological: She is alert and oriented to person, place, and time. Coordination normal.  Skin: Skin is warm and dry.  Psychiatric: She has a normal mood and affect.   Vitals:   03/04/18  0828  BP: 116/70  Pulse: 84  Temp: 98.7 F (37.1 C)  TempSrc: Oral  SpO2: 98%  Weight: 159 lb (72.1 kg)  Height: 5\' 3"  (1.6 m)      Assessment & Plan:

## 2018-03-04 NOTE — Assessment & Plan Note (Signed)
Checking HgA1c and adjust if needed. Taking metformin 500 mg daily. On statin.

## 2018-03-04 NOTE — Patient Instructions (Addendum)
We will check the labs today and sent in the supplies for the blood sugar meter.

## 2018-03-04 NOTE — Assessment & Plan Note (Signed)
Murmur stable and likely flow murmur, does not need cardiology follow up and reassurance given at exam.

## 2018-03-05 DIAGNOSIS — D49512 Neoplasm of unspecified behavior of left kidney: Secondary | ICD-10-CM | POA: Diagnosis not present

## 2018-04-01 DIAGNOSIS — H40051 Ocular hypertension, right eye: Secondary | ICD-10-CM | POA: Diagnosis not present

## 2018-04-01 DIAGNOSIS — H40023 Open angle with borderline findings, high risk, bilateral: Secondary | ICD-10-CM | POA: Diagnosis not present

## 2018-04-24 ENCOUNTER — Emergency Department (HOSPITAL_COMMUNITY)
Admission: EM | Admit: 2018-04-24 | Discharge: 2018-04-24 | Disposition: A | Discharge status: Home / Self Care | Payer: Medicare HMO | Attending: Emergency Medicine | Admitting: Emergency Medicine

## 2018-04-24 ENCOUNTER — Other Ambulatory Visit: Payer: Self-pay

## 2018-04-24 ENCOUNTER — Encounter (HOSPITAL_COMMUNITY): Payer: Self-pay | Admitting: Emergency Medicine

## 2018-04-24 DIAGNOSIS — Z7984 Long term (current) use of oral hypoglycemic drugs: Secondary | ICD-10-CM | POA: Insufficient documentation

## 2018-04-24 DIAGNOSIS — Z87891 Personal history of nicotine dependence: Secondary | ICD-10-CM | POA: Insufficient documentation

## 2018-04-24 DIAGNOSIS — Z79899 Other long term (current) drug therapy: Secondary | ICD-10-CM | POA: Diagnosis not present

## 2018-04-24 DIAGNOSIS — I129 Hypertensive chronic kidney disease with stage 1 through stage 4 chronic kidney disease, or unspecified chronic kidney disease: Secondary | ICD-10-CM | POA: Diagnosis not present

## 2018-04-24 DIAGNOSIS — Z7982 Long term (current) use of aspirin: Secondary | ICD-10-CM | POA: Diagnosis not present

## 2018-04-24 DIAGNOSIS — R42 Dizziness and giddiness: Secondary | ICD-10-CM | POA: Diagnosis not present

## 2018-04-24 DIAGNOSIS — N189 Chronic kidney disease, unspecified: Secondary | ICD-10-CM | POA: Diagnosis not present

## 2018-04-24 DIAGNOSIS — E1122 Type 2 diabetes mellitus with diabetic chronic kidney disease: Secondary | ICD-10-CM | POA: Diagnosis not present

## 2018-04-24 LAB — CBC
HCT: 41.2 % (ref 36.0–46.0)
Hemoglobin: 12.9 g/dL (ref 12.0–15.0)
MCH: 27.2 pg (ref 26.0–34.0)
MCHC: 31.3 g/dL (ref 30.0–36.0)
MCV: 86.9 fL (ref 78.0–100.0)
PLATELETS: 299 10*3/uL (ref 150–400)
RBC: 4.74 MIL/uL (ref 3.87–5.11)
RDW: 13 % (ref 11.5–15.5)
WBC: 7.6 10*3/uL (ref 4.0–10.5)

## 2018-04-24 LAB — URINALYSIS, ROUTINE W REFLEX MICROSCOPIC
BACTERIA UA: NONE SEEN
BILIRUBIN URINE: NEGATIVE
Glucose, UA: NEGATIVE mg/dL
HGB URINE DIPSTICK: NEGATIVE
Ketones, ur: NEGATIVE mg/dL
Nitrite: NEGATIVE
PH: 7 (ref 5.0–8.0)
PROTEIN: NEGATIVE mg/dL
SPECIFIC GRAVITY, URINE: 1.01 (ref 1.005–1.030)

## 2018-04-24 LAB — COMPREHENSIVE METABOLIC PANEL
ALT: 14 U/L (ref 0–44)
AST: 23 U/L (ref 15–41)
Albumin: 4.2 g/dL (ref 3.5–5.0)
Alkaline Phosphatase: 69 U/L (ref 38–126)
Anion gap: 10 (ref 5–15)
BILIRUBIN TOTAL: 0.2 mg/dL — AB (ref 0.3–1.2)
BUN: 12 mg/dL (ref 8–23)
CO2: 24 mmol/L (ref 22–32)
CREATININE: 0.89 mg/dL (ref 0.44–1.00)
Calcium: 9.8 mg/dL (ref 8.9–10.3)
Chloride: 107 mmol/L (ref 98–111)
Glucose, Bld: 100 mg/dL — ABNORMAL HIGH (ref 70–99)
Potassium: 3.7 mmol/L (ref 3.5–5.1)
Sodium: 141 mmol/L (ref 135–145)
TOTAL PROTEIN: 7.7 g/dL (ref 6.5–8.1)

## 2018-04-24 LAB — CBG MONITORING, ED: Glucose-Capillary: 122 mg/dL — ABNORMAL HIGH (ref 70–99)

## 2018-04-24 LAB — LIPASE, BLOOD: Lipase: 83 U/L — ABNORMAL HIGH (ref 11–51)

## 2018-04-24 MED ORDER — SODIUM CHLORIDE 0.9 % IV BOLUS: 1000.0000 mL | Freq: Once | INTRAVENOUS | Status: DC

## 2018-04-24 NOTE — ED Provider Notes (Signed)
[facility] DEPT Provider Note   CSN: [id number] Arrival date & time: 04/24/18  1625     History   Chief Complaint Chief Complaint  Patient presents with  . Dizziness    HPI Jenny Madden is a 77 y.o. female history of CKD, diverticulosis, diabetes, hypertension, renal cancer in remission, here presenting with dizziness.  Patient states that she was walking to the mailbox and it was very hot outside and she did not eat or drink very much prior to that.  She then developed some dizziness and felt shaky.  Patient is that down and felt better.  Patient denies any trouble speaking or weakness or numbness.  Patient thought her blood sugar was slightly low and check her blood sugar and was 87 at home.  Came to the ED for evaluation.  Patient denies any history of stroke and denies any fevers or chills.   The history is provided by the patient.    Past Medical History:  Diagnosis Date  . Allergic rhinitis, cause unspecified   . Anxiety state, unspecified    none recent  . Benign neoplasm of colon   . Cancer ([place])    renal mass  . Cataract   . Chronic kidney disease 01/2016   Tumor left side  . Diverticulosis of colon (without mention of hemorrhage)   . Dysphagia   . Family history of adverse reaction to anesthesia 3 yrs ago   slow to awaken   . Family history of malignant neoplasm of gastrointestinal tract   . Heart murmur   . Hypertension   . Nonspecific (abnormal) findings on radiological and other examination of other intrathoracic organs   . Nonspecific abnormal electrocardiogram (ECG) (EKG)   . Osteoporosis   . Other dysphagia   . Pure hypercholesterolemia   . Type II or unspecified type diabetes mellitus without mention of complication, not stated as uncontrolled   . Unspecified essential hypertension   . Unspecified tinnitus years ago    Patient Active Problem List   Diagnosis Date Noted  . Left renal mass 01/18/2016  . Dysphagia  10/19/2015  . Schatzki's ring 10/19/2015  . Constipation 10/19/2015  . Routine general medical examination at a health care facility 07/13/2015  . Osteopenia 12/27/2011  . Vitamin D deficiency 12/27/2011  . Undiagnosed cardiac murmurs 05/12/2008  . Diabetes mellitus type 2, uncomplicated ([place]) 79/89/2119  . Hyperlipidemia associated with type 2 diabetes mellitus ([place]) 07/29/2007  . Essential hypertension 07/29/2007  . Allergic rhinitis 07/29/2007  . GERD 07/29/2007  . DEGENERATIVE JOINT DISEASE, GENERALIZED 07/29/2007    Past Surgical History:  Procedure Laterality Date  . ABLATION Left 01/2016   Renal mass  . CESAREAN SECTION     x 1  . COLONOSCOPY    . colonscopy and endoscopy  5 yrs ago  . FOOT SURGERY     bilateral for hammer toes  . IR GENERIC HISTORICAL  12/06/2015   IR RADIOLOGIST EVAL & MGMT 12/06/2015 Aletta Edouard, MD [facility] INTERV RAD  . IR GENERIC HISTORICAL  05/03/2016   IR RADIOLOGIST EVAL & MGMT 05/03/2016 Aletta Edouard, MD [facility] INTERV RAD  . IR GENERIC HISTORICAL  02/09/2016   IR RADIOLOGIST EVAL & MGMT 02/09/2016 [facility] INTERV RAD  . IR RADIOLOGIST EVAL & MGMT  01/09/2017  . IR RADIOLOGIST EVAL & MGMT  01/14/2018  . UPPER GASTROINTESTINAL ENDOSCOPY       OB History    Gravida  3   Para  1  Term  1   Preterm      AB  2   Living  1     SAB      TAB      Ectopic      Multiple      Live Births  1            Home Medications    Prior to Admission medications   Medication Sig Start Date End Date Taking? Authorizing Provider  ACCU-CHEK SOFTCLIX LANCETS lancets Use as directed to check blood glucose at least 3 times a day. E11.09 03/04/18   Hoyt Koch, MD  ALPRAZolam Duanne Moron) 0.5 MG tablet Take 1 tablet (0.5 mg total) by mouth daily as needed for anxiety. This is a 3 month supply 11/22/17   Hoyt Koch, MD  amLODipine (NORVASC) 10 MG tablet Take 1 tablet (10 mg total) by mouth daily. 11/22/17   Hoyt Koch, MD    aspirin 81 MG tablet Take 81 mg by mouth every morning.     [provider]  Blood Glucose Monitoring Suppl (ACCU-CHEK AVIVA PLUS) w/Device KIT USE AS DIRECTED  TO CHECK BLOOD SUGAR EVERY DAY 09/23/17   Hoyt Koch, MD  Cholecalciferol (VITAMIN D) 2000 UNITS CAPS Take 2,000 Units by mouth daily.     [provider]  fluticasone (FLONASE) 50 MCG/ACT nasal spray Place 2 sprays into both nostrils daily. 01/30/17   Hoyt Koch, MD  glucose blood (ACCU-CHEK AVIVA PLUS) test strip Use to check blood sugars twice a day Dx E11.9 03/04/18   Hoyt Koch, MD  metFORMIN (GLUCOPHAGE) 500 MG tablet Take 1 tablet (500 mg total) by mouth daily with breakfast. 11/22/17   Hoyt Koch, MD  omeprazole (PRILOSEC) 40 MG capsule Take 1 capsule (40 mg total) by mouth daily. 12/19/17   Hoyt Koch, MD  Polyethyl Glycol-Propyl Glycol (SYSTANE OP) Apply 1-2 drops to eye every morning.    [provider]  simvastatin (ZOCOR) 40 MG tablet Take 1 tablet (40 mg total) by mouth every evening. 11/22/17   Hoyt Koch, MD  Alum & Mag Hydroxide-Simeth (MAGIC MOUTHWASH) SOLN 1 teaspoon gargle and swallow four times a day   12/27/11  [provider]    Family History Family History  Problem Relation Age of Onset  . Stroke Father   . Lung cancer Father   . Coronary artery disease Father   . Hypertension Mother   . Heart attack Mother   . Colon cancer Brother 29  . Esophageal cancer Neg Hx   . Rectal cancer Neg Hx   . Stomach cancer Neg Hx     Social History Social History   Tobacco Use  . Smoking status: Former Smoker    Types: Cigarettes    Last attempt to quit: 10/09/1999    Years since quitting: 18.5  . Smokeless tobacco: Never Used  Substance Use Topics  . Alcohol use: Yes    Alcohol/week: 0.6 oz    Types: 1 Standard drinks or equivalent per week    Comment: social use  . Drug use: No     Allergies   Patient has no  known allergies.   Review of Systems Review of Systems  Neurological: Positive for dizziness.  All other systems reviewed and are negative.    Physical Exam Updated Vital Signs BP (!) 165/81 (BP Location: Left Arm)   Pulse 96   Temp 98.7 F (37.1 C) (Oral)  Resp 18   Ht '5\' 3"'  (1.6 m)   Wt 71.7 kg (158 lb)   SpO2 100%   BMI 27.99 kg/m   Physical Exam  Constitutional: She is oriented to person, place, and time. She appears well-developed and well-nourished.  HENT:  Head: Normocephalic.  Mouth/Throat: Oropharynx is clear and moist.  Eyes: Pupils are equal, round, and reactive to light. Conjunctivae and EOM are normal.  No obvious nystagmus   Neck: Normal range of motion. Neck supple.  Cardiovascular: Normal rate, regular rhythm and normal heart sounds.  Pulmonary/Chest: Effort normal and breath sounds normal. No stridor. No respiratory distress. She has no wheezes.  Abdominal: Soft. Bowel sounds are normal. She exhibits no distension. There is no tenderness. There is no guarding.  Musculoskeletal: Normal range of motion.  Neurological: She is alert and oriented to person, place, and time.  CN 2- 12 intact, nl strength and sensation throughout. Nl finger to nose bilaterally. Nl gait  Skin: Skin is warm.  Psychiatric: She has a normal mood and affect.  Nursing note and vitals reviewed.    ED Treatments / Results  Labs (all labs ordered are listed, but only abnormal results are displayed) Labs Reviewed  LIPASE, BLOOD - Abnormal; Notable for the following components:      Result Value   Lipase 83 (*)    All other components within normal limits  COMPREHENSIVE METABOLIC PANEL - Abnormal; Notable for the following components:   Glucose, Bld 100 (*)    Total Bilirubin 0.2 (*)    All other components within normal limits  URINALYSIS, ROUTINE W REFLEX MICROSCOPIC - Abnormal; Notable for the following components:   Color, Urine STRAW (*)    Leukocytes, UA TRACE (*)     All other components within normal limits  CBG MONITORING, ED - Abnormal; Notable for the following components:   Glucose-Capillary 122 (*)    All other components within normal limits  CBC    EKG None  Radiology No results found.  Procedures Procedures (including critical care time)  Medications Ordered in ED Medications - No data to display   Initial Impression / Assessment and Plan / ED Course  I have reviewed the triage vital signs and the nursing notes.  Pertinent labs & imaging results that were available during my care of the patient were reviewed by me and considered in my medical decision making (see chart for details).    TAJAH NOGUCHI is a 77 y.o. female here with dizziness. Didn't eat much today and was out in the heat. Has no nystagmus and has nl neuro exam currently. Consider hypoglycemia vs mild dehydration. I doubt posterior circulation stroke and she had no vertigo. Will get labs, orthostatics, CBG.  10:04 PM Labs at baseline. Of note, her lipase was 87 but has no abdominal pain and this is consistent with previous values. Repeat CBG is 122 and she felt well. Not orthostatic. Stable for discharge    Final Clinical Impressions(s) / ED Diagnoses   Final diagnoses:  None    ED Discharge Orders    None       Drenda Freeze, MD 04/24/18 2204

## 2018-04-24 NOTE — ED Triage Notes (Signed)
Pt reports walked to mailbox and talked to her neighbor for about 10-15 minutes and then went back inside. Reports when she got inside felt dizzy and shaky. Pt checked CBG at home 87.

## 2018-04-24 NOTE — ED Notes (Signed)
CBG 122 

## 2018-04-24 NOTE — Discharge Instructions (Signed)
Stay hydrated. Eat and drink normally   Continue your current medicines.   See your doctor for follow up in a week   Return to ER if you have worse dizziness, light headedness, passing out, chest pain, weakness, trouble speaking, numbness

## 2018-05-06 ENCOUNTER — Ambulatory Visit (INDEPENDENT_AMBULATORY_CARE_PROVIDER_SITE_OTHER): Payer: Medicare HMO | Admitting: Internal Medicine

## 2018-05-06 ENCOUNTER — Encounter: Payer: Self-pay | Admitting: Internal Medicine

## 2018-05-06 DIAGNOSIS — R748 Abnormal levels of other serum enzymes: Secondary | ICD-10-CM | POA: Diagnosis not present

## 2018-05-06 DIAGNOSIS — R42 Dizziness and giddiness: Secondary | ICD-10-CM | POA: Diagnosis not present

## 2018-05-06 MED ORDER — GLUCOSE BLOOD VI STRP: ORAL_STRIP | 12 refills | Status: DC

## 2018-05-06 MED ORDER — GLUCOSE BLOOD VI STRP: ORAL_STRIP | 3 refills | Status: DC

## 2018-05-06 NOTE — Progress Notes (Signed)
   Subjective:    Patient ID: Jenny Madden, female    DOB: August 12, 1941, 77 y.o.   MRN: [id number]  HPI The patient is a 77 YO female coming in for ER follow up (in for dizziness following outdoor exposure without eating or drinking much that day, observation and symptoms resolved). She is concerned about some labs which were abnormal. She has had elevated lipase in the past and this was mildly elevated at the ER. She denies abdominal pain or diarrhea or vomiting. She is not a drinker of alcohol. She denies dizziness since that episode. Denies chest pains or SOB. She denies headaches or numbness or weakness.   PMH, [facility], social history reviewed and updated.   Review of Systems  Constitutional: Negative.   HENT: Negative.   Eyes: Negative.   Respiratory: Negative for cough, chest tightness and shortness of breath.   Cardiovascular: Negative for chest pain, palpitations and leg swelling.  Gastrointestinal: Negative for abdominal distention, abdominal pain, constipation, diarrhea, nausea and vomiting.  Musculoskeletal: Negative.   Skin: Negative.   Neurological: Negative.   Psychiatric/Behavioral: Negative.       Objective:   Physical Exam  Constitutional: She is oriented to person, place, and time. She appears well-developed and well-nourished.  HENT:  Head: Normocephalic and atraumatic.  Eyes: EOM are normal.  Neck: Normal range of motion.  Cardiovascular: Normal rate and regular rhythm.  Pulmonary/Chest: Effort normal and breath sounds normal. No respiratory distress. She has no wheezes. She has no rales.  Abdominal: Soft. Bowel sounds are normal. She exhibits no distension. There is no tenderness. There is no rebound.  Musculoskeletal: She exhibits no edema.  Neurological: She is alert and oriented to person, place, and time. Coordination normal.  Skin: Skin is warm and dry.  Psychiatric: She has a normal mood and affect.   Vitals:   05/06/18 0928  BP: 124/70  Pulse: 75    Temp: 98 F (36.7 C)  TempSrc: Oral  SpO2: 98%  Weight: 158 lb (71.7 kg)  Height: 5\' 3"  (1.6 m)      Assessment & Plan:

## 2018-05-06 NOTE — Assessment & Plan Note (Signed)
Suspect some orthostatic hypotension from dehydration. We talked about how kidneys are normal and function is normal as well. She is going to be more careful about hydration and eating regularly especially when outdoors.

## 2018-05-06 NOTE — Assessment & Plan Note (Signed)
Likely benign variant. She has had elevated stable levels concurrent with CT imaging with normal pancreas. She is not having symptoms consistent with pancreatitis and levels are not more than 2x normal so we have agreed that this is normal variant and no further imaging or labs are needed.

## 2018-05-06 NOTE — Patient Instructions (Signed)
We do not need any blood work today.   

## 2018-05-07 DIAGNOSIS — R69 Illness, unspecified: Secondary | ICD-10-CM | POA: Diagnosis not present

## 2018-05-12 ENCOUNTER — Telehealth: Payer: Self-pay

## 2018-05-12 NOTE — Telephone Encounter (Signed)
Copied from [place] [id number]. Topic: General - Other >> May 12, 2018  8:35 AM Keene Breath wrote: Reason for CRM: Patient called to ask nurse questions regarding her diabetes medication.  Please call patient back.  CB# [phone].

## 2018-05-12 NOTE — Telephone Encounter (Signed)
Patient states that she has stopped taking her metformin last Monday and when eating her sugars are 130 then will go down to 88 and sometimes will go lower and will eat something to bring it up. Patient is wanting to know if she no longer has diabetes and if she no longer needs to take the metformin. Patient just wants to make sure there is no other underlying problem that would cause the BS to be normal or if she is okay. Should she come back soon to recheck A1C if not when should she come back. Please advise  Patient aware PCP is out till tomorrow

## 2018-05-13 NOTE — Telephone Encounter (Signed)
She should not be taking metformin as her sugars are well controlled without this currently. Needs HgA1c around November and can come back then.

## 2018-05-13 NOTE — Addendum Note (Signed)
Addended by: Pricilla Holm A on: 05/13/2018 08:16 AM   Modules accepted: Orders

## 2018-05-13 NOTE — Telephone Encounter (Signed)
Patient informed of MD response and stated understanding  

## 2018-05-15 ENCOUNTER — Other Ambulatory Visit: Payer: Self-pay

## 2018-05-15 ENCOUNTER — Telehealth: Payer: Self-pay | Admitting: Internal Medicine

## 2018-05-15 MED ORDER — GLUCOSE BLOOD VI STRP: ORAL_STRIP | 3 refills | Status: DC

## 2018-05-15 NOTE — Telephone Encounter (Signed)
Please refax Rx for test strips

## 2018-05-15 NOTE — Telephone Encounter (Signed)
Copied from [place] [id number]. Topic: Quick Communication - Rx Refill/Question >> May 15, 2018  1:59 PM Synthia Innocent wrote: Medication: glucose blood (ACCU-CHEK AVIVA PLUS) test strip  Has the patient contacted their pharmacy? Yes.   (Agent: If no, request that the patient contact the pharmacy for the refill.) (Agent: If yes, when and what did the pharmacy advise?)  Preferred Pharmacy (with phone number or street name): [facility], they did not receive rx from 05/05/18  Agent: Please be advised that RX refills may take up to 3 business days. We ask that you follow-up with your pharmacy.

## 2018-05-15 NOTE — Telephone Encounter (Signed)
Re sent test strips to [facility]

## 2018-05-16 DIAGNOSIS — R69 Illness, unspecified: Secondary | ICD-10-CM | POA: Diagnosis not present

## 2018-05-16 MED ORDER — BLOOD GLUCOSE METER KIT: PACK | 0 refills | Status: AC

## 2018-05-16 MED ORDER — GLUCOSE BLOOD VI STRP: ORAL_STRIP | 1 refills | Status: DC

## 2018-05-16 MED ORDER — LANCETS MISC: 1 refills | Status: DC

## 2018-05-16 NOTE — Telephone Encounter (Signed)
New Rx sent to pharmacy

## 2018-05-16 NOTE — Telephone Encounter (Signed)
Patient calling and states that she spoke to her insurance company and they do not cover anything that is [place]. Patient states that she is needing a accu-chek ultra or verio. States that her insurance covers either of those. She would need the glucometer and testing strips. Please advise.  CB# to pharmacy: [phone]  [facility] [place] [place] [facility]

## 2018-05-20 ENCOUNTER — Telehealth: Payer: Self-pay | Admitting: Internal Medicine

## 2018-05-20 MED ORDER — ONETOUCH ULTRA 2 W/DEVICE KIT: PACK | 0 refills | Status: DC

## 2018-05-20 NOTE — Telephone Encounter (Unsigned)
Copied from [place] [id number]. Topic: Quick Communication - See Telephone Encounter >> May 20, 2018  2:49 PM Neva Seat wrote: Pt wanting a call back asap.  Pt wants the OneTouch Ultra brand for her kit since insurance covers this Rx.

## 2018-05-20 NOTE — Telephone Encounter (Signed)
Resent an order for blood glucose kit for onetouch ultra to both [facility] as I was told to send it to two different places

## 2018-05-20 NOTE — Addendum Note (Signed)
Addended by: Raford Pitcher R on: 05/20/2018 04:34 PM   Modules accepted: Orders

## 2018-05-20 NOTE — Telephone Encounter (Signed)
Attached to original telephone note.

## 2018-05-20 NOTE — Telephone Encounter (Signed)
Copied from [place] [id number]. Topic: Quick Communication - See Telephone Encounter >> May 20, 2018  2:49 PM Neva Seat wrote: Pt wanting a call back asap.  Pt wants the OneTouch Ultra brand for her kit since insurance covers this Rx.

## 2018-05-21 DIAGNOSIS — R69 Illness, unspecified: Secondary | ICD-10-CM | POA: Diagnosis not present

## 2018-05-21 NOTE — Telephone Encounter (Deleted)
Copied from [place] [phone]. Topic: General - Other >> May 20, 2018  5:12 PM Yvette Rack wrote: Reason for CRM: Pt called in requesting a return call regarding her request for Rx for OneTouch Ultra brand kit. Cb# [phone]

## 2018-05-26 ENCOUNTER — Telehealth: Payer: Self-pay | Admitting: Internal Medicine

## 2018-05-26 MED ORDER — ONETOUCH LANCETS MISC: 0 refills | Status: DC

## 2018-05-26 MED ORDER — GLUCOSE BLOOD VI STRP: ORAL_STRIP | 2 refills | Status: DC

## 2018-05-26 NOTE — Telephone Encounter (Signed)
rx sent

## 2018-05-26 NOTE — Telephone Encounter (Signed)
Copied from [place] [id number]. Topic: Quick Communication - Rx Refill/Question >> May 26, 2018  3:33 PM Lysle Morales L, NT wrote: Medication: ( Blood Glucose Monitoring Suppl (ONE TOUCH ULTRA 2)  testing strips and also   l (ONE TOUCH ULTRA 2)   lancets   Has the patient contacted their pharmacy? no (Agent: If no, request that the patient contact the pharmacy for the refill. (Agent: If yes, when and what did the pharmacy advise?  Preferred Pharmacy (with phone number or street name  [facility] [place] [place] [facility] [phone] (Phone) [phone] (Fax)    Agent: Please be advised that RX refills may take up to 3 business days. We ask that you follow-up with your pharmacy.

## 2018-05-27 DIAGNOSIS — R69 Illness, unspecified: Secondary | ICD-10-CM | POA: Diagnosis not present

## 2018-05-28 ENCOUNTER — Telehealth: Payer: Self-pay | Admitting: *Deleted

## 2018-05-28 NOTE — Telephone Encounter (Signed)
Pt states [facility] called her and stated One Touch Ultra BLUE strips were sent. Pt states uses ONE Touch Ultra 2. Correct refill was sent initially. Pt instructed to [facility] to clarify.

## 2018-06-02 ENCOUNTER — Telehealth: Payer: Self-pay | Admitting: Internal Medicine

## 2018-06-02 DIAGNOSIS — R69 Illness, unspecified: Secondary | ICD-10-CM | POA: Diagnosis not present

## 2018-06-02 NOTE — Telephone Encounter (Signed)
Would you like patient to make an appointment to discuss BS levels

## 2018-06-02 NOTE — Telephone Encounter (Signed)
Does she have actual numbers of her blood sugar? It should not go below 60 normally. If she wants to discuss in depth she can make visit.

## 2018-06-02 NOTE — Telephone Encounter (Signed)
Contacted patient and informed her of MD response and patient stated understanding. Patient states that she does not keep a log of her BS that she usually can feel when it is getting low and eats something and sugar goes up. Informed patient to start keeping a sugar log and if her normal is ever below 60 to call back or if she wants to make an appointment to discuss in depth she can

## 2018-06-02 NOTE — Telephone Encounter (Unsigned)
Copied from [place]. Topic: Quick Communication - See Telephone Encounter >> Jun 02, 2018 11:38 AM Percell Belt A wrote: CRM for notification. See Telephone encounter for: 06/02/18. Pt called and stated that she is currently not on any meds for her sugar.  She stated that she does still check it and it seems to go down if she does not eat enough.  She thinks it goes below 70.  She would like to speak with CMA about this    Best number  [phone]

## 2018-06-12 DIAGNOSIS — R69 Illness, unspecified: Secondary | ICD-10-CM | POA: Diagnosis not present

## 2018-06-25 ENCOUNTER — Other Ambulatory Visit (INDEPENDENT_AMBULATORY_CARE_PROVIDER_SITE_OTHER): Payer: Medicare HMO

## 2018-06-25 ENCOUNTER — Ambulatory Visit (INDEPENDENT_AMBULATORY_CARE_PROVIDER_SITE_OTHER): Payer: Medicare HMO | Admitting: Internal Medicine

## 2018-06-25 ENCOUNTER — Encounter: Payer: Self-pay | Admitting: Internal Medicine

## 2018-06-25 VITALS — BP 118/70 | HR 82 | Temp 98.8°F | Ht 63.0 in | Wt 156.0 lb

## 2018-06-25 DIAGNOSIS — E119 Type 2 diabetes mellitus without complications: Secondary | ICD-10-CM

## 2018-06-25 LAB — COMPREHENSIVE METABOLIC PANEL
ALT: 11 U/L (ref 0–35)
AST: 19 U/L (ref 0–37)
Albumin: 4.3 g/dL (ref 3.5–5.2)
Alkaline Phosphatase: 74 U/L (ref 39–117)
BUN: 9 mg/dL (ref 6–23)
CALCIUM: 10.1 mg/dL (ref 8.4–10.5)
CO2: 28 meq/L (ref 19–32)
Chloride: 105 mEq/L (ref 96–112)
Creatinine, Ser: 1 mg/dL (ref 0.40–1.20)
GFR: 68.98 mL/min (ref >60.00)
Glucose, Bld: 104 mg/dL — ABNORMAL HIGH (ref 70–99)
Potassium: 4.2 mEq/L (ref 3.5–5.1)
Sodium: 140 mEq/L (ref 135–145)
Total Bilirubin: 0.3 mg/dL (ref 0.2–1.2)
Total Protein: 7.8 g/dL (ref 6.0–8.3)

## 2018-06-25 LAB — HEMOGLOBIN A1C: Hgb A1c MFr Bld: 6.2 % (ref 4.6–6.5)

## 2018-06-25 NOTE — Assessment & Plan Note (Signed)
She is now taking metformin which we had stopped previously. This could be contributing to low sugars. She has not captured a low sugar but does not check prior to eating. We are checking HgA1c today and sugar. We have asked her to switch to small frequent meals every 2-3 hours and stop taking metformin.

## 2018-06-25 NOTE — Progress Notes (Signed)
   Subjective:    Patient ID: Jenny Madden, female    DOB: [birth year], 77 y.o.   MRN: [id number]  HPI The patient is a 77 YO female coming in for concerns about blood sugar levels. She feels as though she has to eat to keep the blood sugar up. She has not measured the levels when this happens she just eats and feels better. Then she measures the sugar after eating and it is around 70. Happens sometimes during the day. She can get to feeling dizzy, some blurring of her vision. She has been to eye doctor and states her eyes are okay. She normally only eats 2 meals per day. She did not eat yet today at all. Blood sugar was 100 after waking up and 130 before coming to visit. Is feeling a little bit dizzy now. She is not supposed to be taking any medications for sugars but started taking metformin again in the last several months as she thought this would regulate her sugars. She was concerned about how high they were getting after eating. Max 180.    Review of Systems  Constitutional: Negative.   HENT: Negative.   Eyes: Positive for visual disturbance.  Respiratory: Negative for cough, chest tightness and shortness of breath.   Cardiovascular: Negative for chest pain, palpitations and leg swelling.  Gastrointestinal: Negative for abdominal distention, abdominal pain, constipation, diarrhea, nausea and vomiting.  Musculoskeletal: Negative.   Skin: Negative.   Neurological: Positive for dizziness.  Psychiatric/Behavioral: Negative.       Objective:   Physical Exam  Constitutional: She is oriented to person, place, and time. She appears well-developed and well-nourished.  HENT:  Head: Normocephalic and atraumatic.  Eyes: EOM are normal.  Neck: Normal range of motion.  Cardiovascular: Normal rate and regular rhythm.  Pulmonary/Chest: Effort normal and breath sounds normal. No respiratory distress. She has no wheezes. She has no rales.  Abdominal: Soft. Bowel sounds are normal. She exhibits  no distension. There is no tenderness. There is no rebound.  Musculoskeletal: She exhibits no edema.  Neurological: She is alert and oriented to person, place, and time. Coordination normal.  Skin: Skin is warm and dry.  Psychiatric: She has a normal mood and affect.   Vitals:   06/25/18 0840  BP: 118/70  Pulse: 82  Temp: 98.8 F (37.1 C)  TempSrc: Oral  SpO2: 99%  Weight: 156 lb (70.8 kg)  Height: 5\' 3"  (1.6 m)      Assessment & Plan:  Visit time 25 minutes: greater than 50% of that time was spent in face to face counseling and coordination of care with the patient: counseled about normal changes in sugar levels throughout the day, need to eat regular meals, need to not take medications which have been stopped

## 2018-06-25 NOTE — Patient Instructions (Signed)
We will check the labs today and call you back about the results.   It is okay to stop taking zantac.   I would stop taking metformin.   Work on eating smaller snack sized meals about every 2-3 hours during the day to keep the sugar levels stable.

## 2018-07-01 ENCOUNTER — Encounter: Payer: Self-pay | Admitting: Internal Medicine

## 2018-07-01 ENCOUNTER — Ambulatory Visit (INDEPENDENT_AMBULATORY_CARE_PROVIDER_SITE_OTHER): Payer: Medicare HMO | Admitting: Internal Medicine

## 2018-07-01 ENCOUNTER — Telehealth: Payer: Self-pay | Admitting: Internal Medicine

## 2018-07-01 VITALS — BP 148/72 | HR 83 | Temp 98.7°F | Resp 16 | Ht 63.0 in | Wt 157.4 lb

## 2018-07-01 DIAGNOSIS — E119 Type 2 diabetes mellitus without complications: Secondary | ICD-10-CM | POA: Diagnosis not present

## 2018-07-01 DIAGNOSIS — K219 Gastro-esophageal reflux disease without esophagitis: Secondary | ICD-10-CM | POA: Diagnosis not present

## 2018-07-01 MED ORDER — PANTOPRAZOLE SODIUM 40 MG PO TBEC: 40.0000 mg | DELAYED_RELEASE_TABLET | Freq: Every day | ORAL | 0 refills | Status: DC

## 2018-07-01 MED ORDER — LANSOPRAZOLE 30 MG PO CPDR: 30.0000 mg | DELAYED_RELEASE_CAPSULE | Freq: Every day | ORAL | 5 refills | Status: DC

## 2018-07-01 MED ORDER — FAMOTIDINE 40 MG PO TABS: 40.0000 mg | ORAL_TABLET | Freq: Every day | ORAL | 0 refills | Status: DC

## 2018-07-01 MED ORDER — PANTOPRAZOLE SODIUM 40 MG PO TBEC: 40.0000 mg | DELAYED_RELEASE_TABLET | Freq: Every day | ORAL | 1 refills | Status: DC

## 2018-07-01 MED ORDER — FAMOTIDINE 40 MG PO TABS: 40.0000 mg | ORAL_TABLET | Freq: Every day | ORAL | 1 refills | Status: DC

## 2018-07-01 MED ORDER — METFORMIN HCL 500 MG PO TABS: 500.0000 mg | ORAL_TABLET | Freq: Every day | ORAL | 3 refills | Status: DC

## 2018-07-01 NOTE — Progress Notes (Signed)
Subjective:    Patient ID: Jenny Madden, female    DOB: 1941-04-13, 77 y.o.   MRN: [id number]  HPI The patient is here for an acute visit.  GERD, epigastric discomfort: She is taking omeprazole daily and Zantac daily.  She does have a history of esophageal stricture and had a dilation earlier this year.  She denies any dysphasia currently.  She does state some discomfort in her epigastric region and reflux on a daily basis.   Sh has had some nausea, especially last night.   Diabetes: She was recently taken off her metformin 500 mg once daily due possible side effects.  She was taking the metformin with breakfast which she typically eats oatmeal.  She sometimes also has fruits with it.  Sometimes in the mid morning she would feel lightheaded and she would eat something.  By the time she checked her sugar it was an extremely low, but still on the low side.  She has been off metformin for several days.  She does get lightheadedness for sugars are too high or too low and she is concerned that her sugars have been high since going off the metformin.  This morning when she first woke up her glucose was 110.  After eating it was 204 and before she came here 174.  Since stopping the metformin the symptoms she was experiencing have not changed and she wonders if that was her reflux.    Medications and allergies reviewed with patient and updated if appropriate.  Patient Active Problem List   Diagnosis Date Noted  . Elevated lipase 05/06/2018  . Dizziness 05/06/2018  . Left renal mass 01/18/2016  . Dysphagia 10/19/2015  . Schatzki's ring 10/19/2015  . Constipation 10/19/2015  . Routine general medical examination at a health care facility 07/13/2015  . Osteopenia 12/27/2011  . Vitamin D deficiency 12/27/2011  . Undiagnosed cardiac murmurs 05/12/2008  . Diabetes mellitus type 2, uncomplicated ([place]) 71/69/6789  . Hyperlipidemia associated with type 2 diabetes mellitus ([place]) 07/29/2007  .  Essential hypertension 07/29/2007  . Allergic rhinitis 07/29/2007  . GERD 07/29/2007  . DEGENERATIVE JOINT DISEASE, GENERALIZED 07/29/2007    Current Outpatient Medications on File Prior to Visit  Medication Sig Dispense Refill  . amLODipine (NORVASC) 10 MG tablet Take 1 tablet (10 mg total) by mouth daily. 90 tablet 2  . aspirin 81 MG tablet Take 81 mg by mouth every morning.     . blood glucose meter kit and supplies Dispense based on patient and insurance preference. Use up to four times daily as directed. (E11.9). 1 each 0  . Blood Glucose Monitoring Suppl (ONE TOUCH ULTRA 2) w/Device KIT Used to check blood sugars twice daily 1 each 0  . Cholecalciferol (VITAMIN D) 2000 UNITS CAPS Take 2,000 Units by mouth daily.     . fluticasone (FLONASE) 50 MCG/ACT nasal spray Place 2 sprays into both nostrils daily. 48 g 3  . glucose blood (ONE TOUCH ULTRA TEST) test strip Used to check blood sugars twice daily 100 each 2  . omeprazole (PRILOSEC) 40 MG capsule Take 1 capsule (40 mg total) by mouth daily. 90 capsule 3  . ONE TOUCH LANCETS MISC Used to check blood sugars twice daily 200 each 0  . Polyethyl Glycol-Propyl Glycol (SYSTANE OP) Apply 1-2 drops to eye every morning.    . ranitidine (ZANTAC) 150 MG tablet Take 150 mg by mouth daily.    . simvastatin (ZOCOR) 40 MG tablet Take 1 tablet (  40 mg total) by mouth every evening. 90 tablet 2  . [DISCONTINUED] Alum & Mag Hydroxide-Simeth (MAGIC MOUTHWASH) SOLN 1 teaspoon gargle and swallow four times a day      No current facility-administered medications on file prior to visit.     Past Medical History:  Diagnosis Date  . Allergic rhinitis, cause unspecified   . Anxiety state, unspecified    none recent  . Benign neoplasm of colon   . Cancer ([place])    renal mass  . Cataract   . Chronic kidney disease 01/2016   Tumor left side  . Diverticulosis of colon (without mention of hemorrhage)   . Dysphagia   . Family history of adverse reaction to  anesthesia 3 yrs ago   slow to awaken   . Family history of malignant neoplasm of gastrointestinal tract   . Heart murmur   . Hypertension   . Nonspecific (abnormal) findings on radiological and other examination of other intrathoracic organs   . Nonspecific abnormal electrocardiogram (ECG) (EKG)   . Osteoporosis   . Other dysphagia   . Pure hypercholesterolemia   . Type II or unspecified type diabetes mellitus without mention of complication, not stated as uncontrolled   . Unspecified essential hypertension   . Unspecified tinnitus years ago    Past Surgical History:  Procedure Laterality Date  . ABLATION Left 01/2016   Renal mass  . CESAREAN SECTION     x 1  . COLONOSCOPY    . colonscopy and endoscopy  5 yrs ago  . FOOT SURGERY     bilateral for hammer toes  . IR GENERIC HISTORICAL  12/06/2015   IR RADIOLOGIST EVAL & MGMT 12/06/2015 Aletta Edouard, MD [facility] INTERV RAD  . IR GENERIC HISTORICAL  05/03/2016   IR RADIOLOGIST EVAL & MGMT 05/03/2016 Aletta Edouard, MD [facility] INTERV RAD  . IR GENERIC HISTORICAL  02/09/2016   IR RADIOLOGIST EVAL & MGMT 02/09/2016 [facility] INTERV RAD  . IR RADIOLOGIST EVAL & MGMT  01/09/2017  . IR RADIOLOGIST EVAL & MGMT  01/14/2018  . UPPER GASTROINTESTINAL ENDOSCOPY      Social History   Socioeconomic History  . Marital status: Married    Spouse name: bob x 37 yrs  . Number of children: 3  . Years of education: Not on file  . Highest education level: Not on file  Occupational History  . Occupation: Airline pilot: RETIRED  Social Needs  . Financial resource strain: Not on file  . Food insecurity:    Worry: Not on file    Inability: Not on file  . Transportation needs:    Medical: Not on file    Non-medical: Not on file  Tobacco Use  . Smoking status: Former Smoker    Types: Cigarettes    Last attempt to quit: 10/09/1999    Years since quitting: 18.7  . Smokeless tobacco: Never Used  Substance and Sexual Activity  . Alcohol use: Yes     Alcohol/week: 1.0 standard drinks    Types: 1 Standard drinks or equivalent per week    Comment: social use  . Drug use: No  . Sexual activity: Never    Comment: 1st intercourse- 21, partners- 3, widow   Lifestyle  . Physical activity:    Days per week: Not on file    Minutes per session: Not on file  . Stress: Not on file  Relationships  . Social connections:    Talks on phone: Not on  file    Gets together: Not on file    Attends religious service: Not on file    Active member of club or organization: Not on file    Attends meetings of clubs or organizations: Not on file    Relationship status: Not on file  Other Topics Concern  . Not on file  Social History Narrative   Married to husband, [name] x45yr   3 children - 1 son w/ Hodgkin's, 2 daughters alive and well   High school education    Family History  Problem Relation Age of Onset  . Stroke Father   . Lung cancer Father   . Coronary artery disease Father   . Hypertension Mother   . Heart attack Mother   . Colon cancer Brother 681 . Esophageal cancer Neg Hx   . Rectal cancer Neg Hx   . Stomach cancer Neg Hx     Review of Systems  Cardiovascular: Negative for palpitations.  Gastrointestinal: Positive for nausea. Negative for abdominal pain.       Reflux daily  Neurological: Positive for light-headedness (with low sugars or high sugars).       Objective:   Vitals:   07/01/18 0957  BP: (!) 148/72  Pulse: 83  Resp: 16  Temp: 98.7 F (37.1 C)  SpO2: 96%   BP Readings from Last 3 Encounters:  07/01/18 (!) 148/72  06/25/18 118/70  05/06/18 124/70   Wt Readings from Last 3 Encounters:  07/01/18 157 lb 6.4 oz (71.4 kg)  06/25/18 156 lb (70.8 kg)  05/06/18 158 lb (71.7 kg)   Body mass index is 27.88 kg/m.   Physical Exam    Constitutional: Appears well-developed and well-nourished. No distress.  HENT:  Head: Normocephalic and atraumatic.  Neck: Neck supple. No tracheal deviation present. No  thyromegaly present.  No cervical lymphadenopathy Cardiovascular: Normal rate, regular rhythm and normal heart sounds.   3/6 systolic murmur heard. No carotid bruit .  No edema Pulmonary/Chest: Effort normal and breath sounds normal. No respiratory distress. No has no wheezes. No rales.  Skin: Skin is warm and dry. Not diaphoretic.  Psychiatric: Normal mood and affect. Behavior is normal.      Assessment & Plan:    See Problem List for Assessment and Plan of chronic medical problems.

## 2018-07-01 NOTE — Telephone Encounter (Signed)
Pt states that the Previcid isn't covered by her insurance.  States the the only medication covered is Omeprazole and she already has some of that.

## 2018-07-01 NOTE — Telephone Encounter (Signed)
Copied from [place] [phone]. Topic: General - Other >> Jul 01, 2018 12:32 PM Mcneil, Ja-Kwan wrote: Reason for CRM: Pt states her insurance will not cover the Rx for pantoprazole (PROTONIX) 40 MG tablet. Pt requests a list of alternative medications that she can provide to her insurance company to see which one will be covered. Pt requests a call back. Cb# [phone]

## 2018-07-01 NOTE — Patient Instructions (Addendum)
   Medications reviewed and updated.  Changes include :   Stop omeprazole and start pantoprazole.  Stop zantac and start pepcid.   Try restarting metformin with lunch or dinner.   Your prescription(s) have been submitted to your pharmacy. Please take as directed and contact our office if you believe you are having problem(s) with the medication(s).

## 2018-07-01 NOTE — Telephone Encounter (Signed)
Pt states her insurance will cover the omeprazole and she states she will continue taking that.

## 2018-07-01 NOTE — Assessment & Plan Note (Signed)
Metformin recently stopped secondary to possibly low sugars and questionable side effects She does not feel the difference since stopping metformin and is now concerned that her sugars are too high Tends to get lower sugars midmorning-she was encouraged to snack in between meals, especially midmorning.  Discussed trying to increase her protein intake Encouraged her to start monitoring her sugars more often Will restart metformin with lunch or supper, which are her bigger meals, but stressed that she needs to check her sugars more frequently and either needs to increase her protein or have a snack between meals, especially midmorning Follow-up with PCP as scheduled, sooner if needed

## 2018-07-01 NOTE — Telephone Encounter (Signed)
Generic prevacid sent to pharmacy - not sure if this is cheaper.  Ok to not take zantac or pepcid if seh does not want to.  We can not tell what is covered by her insurance

## 2018-07-01 NOTE — Assessment & Plan Note (Addendum)
GERD not controlled Stop the omeprazole Start protonix 40 mg  Stop zantac  - start pepcid 40 mg Follow-up with PCP if symptoms are not better controlled

## 2018-07-01 NOTE — Telephone Encounter (Signed)
Called patient to clarify,   Patient does not want ZANTAC or PEPCID,  Please advise on alternative

## 2018-07-01 NOTE — Telephone Encounter (Signed)
Pt.notified

## 2018-07-02 MED ORDER — OMEPRAZOLE 40 MG PO CPDR: 40.0000 mg | DELAYED_RELEASE_CAPSULE | Freq: Two times a day (BID) | ORAL | 0 refills | Status: DC

## 2018-07-02 NOTE — Addendum Note (Signed)
Addended by: Binnie Rail on: 07/02/2018 08:41 PM   Modules accepted: Orders

## 2018-07-02 NOTE — Telephone Encounter (Signed)
Go back to omeprazole - increase to 2 times a day - 30 min prior to breakfast and dinner. New rx sent to [facility].

## 2018-07-02 NOTE — Telephone Encounter (Signed)
Do you want her to check with insurance for an alternative?

## 2018-07-03 NOTE — Telephone Encounter (Signed)
Pt aware of response.  

## 2018-07-09 ENCOUNTER — Telehealth: Payer: Self-pay | Admitting: Internal Medicine

## 2018-07-09 NOTE — Telephone Encounter (Signed)
Copied from [place] [phone]. Topic: General - Other >> Jul 09, 2018 11:33 AM Yvette Rack wrote: Reason for CRM: Pt states she is having some of the same symptoms like blood sugar is going down. Pt requests a call back from Dr Nathanial Millman nurse. Cb# [phone]

## 2018-07-09 NOTE — Telephone Encounter (Signed)
Jenny Madden states blood sugar this morning was 94.   Jenny Madden states at 9:30pm blood sugar was 130.   Jenny Madden states she is not on insulin.   States she has no energy when her blood sugar is lower.   Jenny Madden states she spoke with Dr. Quay Burow at her last OV in regard to this and spoke with Dr. Sharlet Salina the week prior.   Jenny Madden would like to know if something can be done without her having to come back in.

## 2018-07-10 NOTE — Telephone Encounter (Signed)
We need to know more about when and how much she is eating. She should be eating a small meal every 2-3 hours to help stabilize blood sugars and mid-morning protein containing snack to help.

## 2018-07-10 NOTE — Telephone Encounter (Signed)
Patient states that she has a cup of coffee in the morning with equal and creamer, will do two pieces of toast with peanut butter and honey, grapes, saltine crackers, and then has a protein bar for mid morning. States that she has not let the blood sugars go down since her call to [place]. This morning has been sick to her stomach and has no energy but states she will talk about that at her visit tomorrow.

## 2018-07-11 ENCOUNTER — Encounter: Payer: Self-pay | Admitting: Internal Medicine

## 2018-07-11 ENCOUNTER — Ambulatory Visit (INDEPENDENT_AMBULATORY_CARE_PROVIDER_SITE_OTHER): Payer: Medicare HMO | Admitting: Internal Medicine

## 2018-07-11 VITALS — BP 120/60 | HR 81 | Temp 98.7°F | Ht 63.0 in | Wt 156.0 lb

## 2018-07-11 DIAGNOSIS — E119 Type 2 diabetes mellitus without complications: Secondary | ICD-10-CM

## 2018-07-11 NOTE — Patient Instructions (Signed)
We will have you take the pepcid regular for a week or two and let us know if your stomach is not better.

## 2018-07-11 NOTE — Progress Notes (Signed)
   Subjective:    Patient ID: Jenny Madden, female    DOB: Oct 06, 1941, 77 y.o.   MRN: [id number]  HPI The patient is a 77 YO female coming in for concerns about her sugars. She was taken off metformin due to some concerns about low sugars. This did not help and she came back in. She then was asked to add some protein in her day as she was eating mostly carbs. She has done that and does not seem to have as many problems. She was then advised to start metformin back again which she has taken just once. She wants to know if she needs it. Overall is having a lot of fatigue, poor appetite. She is on the anniversary of her husband's death last year and yesterday one of her close relatives died. This has caused her to feel poorly she thinks.   Review of Systems  Constitutional: Positive for appetite change and fatigue.  HENT: Negative.   Eyes: Negative.   Respiratory: Negative for cough, chest tightness and shortness of breath.   Cardiovascular: Negative for chest pain, palpitations and leg swelling.  Gastrointestinal: Negative for abdominal distention, abdominal pain, constipation, diarrhea, nausea and vomiting.  Musculoskeletal: Negative.   Skin: Negative.   Neurological: Negative.   Psychiatric/Behavioral: Negative.       Objective:   Physical Exam  Constitutional: She is oriented to person, place, and time. She appears well-developed and well-nourished.  HENT:  Head: Normocephalic and atraumatic.  Eyes: EOM are normal.  Neck: Normal range of motion.  Cardiovascular: Normal rate and regular rhythm.  Pulmonary/Chest: Effort normal and breath sounds normal. No respiratory distress. She has no wheezes. She has no rales.  Abdominal: Soft. Bowel sounds are normal. She exhibits no distension. There is no tenderness. There is no rebound.  Musculoskeletal: She exhibits no edema.  Neurological: She is alert and oriented to person, place, and time. Coordination normal.  Skin: Skin is warm and  dry.   Vitals:   07/11/18 0959  BP: 120/60  Pulse: 81  Temp: 98.7 F (37.1 C)  TempSrc: Oral  SpO2: 98%  Weight: 156 lb (70.8 kg)  Height: 5\' 3"  (1.6 m)      Assessment & Plan:

## 2018-07-11 NOTE — Assessment & Plan Note (Signed)
Foot exam done today. Can stop metformin and continue with eating plan. Needs visit in about 3 months for HgA1c and can adjust if needed.

## 2018-07-14 ENCOUNTER — Other Ambulatory Visit: Payer: Self-pay | Admitting: Internal Medicine

## 2018-07-14 DIAGNOSIS — Z1231 Encounter for screening mammogram for malignant neoplasm of breast: Secondary | ICD-10-CM

## 2018-07-14 MED ORDER — FEXOFENADINE HCL 180 MG PO TABS: 180.0000 mg | ORAL_TABLET | Freq: Every day | ORAL | 3 refills | Status: DC

## 2018-07-14 NOTE — Telephone Encounter (Signed)
Patient is wanting a refill on her allegra that she used to use in the past for her itchy throat and sneezing is currently using her flonase as well

## 2018-07-14 NOTE — Telephone Encounter (Signed)
Copied from [place] [id number]. Topic: Quick Communication - Rx Refill/Question >> Jul 14, 2018  8:32 AM Jenny Madden wrote: Jenny Madden called today asking for a medication to be called in(Jenny Madden did not give a suggestion on a medication); Jenny Madden states her allergy medication has ran out and she has an itchy throat and is sneezing; contact to advise

## 2018-08-01 ENCOUNTER — Telehealth: Payer: Self-pay

## 2018-08-01 NOTE — Telephone Encounter (Signed)
LVM informing patient of MD response  

## 2018-08-01 NOTE — Telephone Encounter (Signed)
Copied from [place] [id number]. Topic: General - Other >> Aug 01, 2018  8:42 AM Carolyn Stare wrote:  Pt is asking if taking the aspirin 81 MG tablet is to much aspirin for her stomach

## 2018-08-01 NOTE — Telephone Encounter (Signed)
No, this should be fine. 

## 2018-08-06 ENCOUNTER — Telehealth: Payer: Self-pay | Admitting: Internal Medicine

## 2018-08-06 NOTE — Telephone Encounter (Signed)
Omeprazole 40 mg twice daily. 

## 2018-08-06 NOTE — Telephone Encounter (Signed)
I have faxed this response to [facility] with the corrections

## 2018-08-06 NOTE — Telephone Encounter (Signed)
Pt called in, she says her insurance will not cover the medication Omeprozole because she can get it OTC. Pt would like to know if she can have an ALT sent to the pharmacy that's not too expensive, or should she just take the OTC medication.

## 2018-08-06 NOTE — Telephone Encounter (Signed)
Copied from [place] [id number]. Topic: General - Other >> Aug 06, 2018  9:56 AM Oneta Rack wrote: Caller name: [facility] [place] [facility] [place] [facility] [phone] (Phone) [phone] (Fax)  Relation to pt:  Call back number: Pharmacy:  Reason for call: Pharmacy in need of clarity regarding omeprazole (PRILOSEC) 40 MG capsule and pantoprazole (PROTONIX) 40 MG tablet, pharmacy would like to know what prescription should they fill, please advise

## 2018-08-07 ENCOUNTER — Telehealth: Payer: Self-pay | Admitting: Internal Medicine

## 2018-08-07 NOTE — Telephone Encounter (Signed)
Copied from [place]. Topic: General - Other >> Aug 07, 2018 11:01 AM Keene Breath wrote: Reason for CRM: [facility] to get quantity and refill information for the patient's medication omeprazole (PRILOSEC) 40 MG capsule.  Pharmacy stated that the medication would be put on hold until they receive a response.  CB# [id number], Ref# [id number]

## 2018-08-07 NOTE — Telephone Encounter (Signed)
Contacted the pharmacy and gave the quantity

## 2018-08-20 ENCOUNTER — Ambulatory Visit
Admission: RE | Admit: 2018-08-20 | Discharge: 2018-08-20 | Disposition: A | Discharge status: Home / Self Care | Payer: Medicare HMO | Source: Ambulatory Visit | Attending: Internal Medicine | Admitting: Internal Medicine

## 2018-08-20 DIAGNOSIS — Z1231 Encounter for screening mammogram for malignant neoplasm of breast: Secondary | ICD-10-CM

## 2018-08-30 ENCOUNTER — Other Ambulatory Visit: Payer: Self-pay | Admitting: Internal Medicine

## 2018-08-30 DIAGNOSIS — R69 Illness, unspecified: Secondary | ICD-10-CM | POA: Diagnosis not present

## 2018-09-01 ENCOUNTER — Other Ambulatory Visit: Payer: Self-pay | Admitting: Internal Medicine

## 2018-09-09 ENCOUNTER — Encounter: Payer: Medicare HMO | Admitting: Internal Medicine

## 2018-09-10 ENCOUNTER — Ambulatory Visit (INDEPENDENT_AMBULATORY_CARE_PROVIDER_SITE_OTHER): Payer: Medicare HMO | Admitting: Internal Medicine

## 2018-09-10 ENCOUNTER — Other Ambulatory Visit (INDEPENDENT_AMBULATORY_CARE_PROVIDER_SITE_OTHER): Payer: Medicare HMO

## 2018-09-10 ENCOUNTER — Encounter: Payer: Self-pay | Admitting: Internal Medicine

## 2018-09-10 VITALS — BP 118/80 | HR 75 | Temp 98.6°F | Ht 63.0 in | Wt 159.0 lb

## 2018-09-10 DIAGNOSIS — E119 Type 2 diabetes mellitus without complications: Secondary | ICD-10-CM | POA: Diagnosis not present

## 2018-09-10 DIAGNOSIS — E785 Hyperlipidemia, unspecified: Secondary | ICD-10-CM | POA: Diagnosis not present

## 2018-09-10 DIAGNOSIS — Z0000 Encounter for general adult medical examination without abnormal findings: Secondary | ICD-10-CM

## 2018-09-10 DIAGNOSIS — E1169 Type 2 diabetes mellitus with other specified complication: Secondary | ICD-10-CM | POA: Diagnosis not present

## 2018-09-10 DIAGNOSIS — I10 Essential (primary) hypertension: Secondary | ICD-10-CM | POA: Diagnosis not present

## 2018-09-10 LAB — MICROALBUMIN / CREATININE URINE RATIO
CREATININE, U: 169.7 mg/dL
Microalb Creat Ratio: 1.1 mg/g (ref 0.0–30.0)
Microalb, Ur: 1.9 mg/dL (ref 0.0–1.9)

## 2018-09-10 LAB — COMPREHENSIVE METABOLIC PANEL
ALK PHOS: 79 U/L (ref 39–117)
ALT: 14 U/L (ref 0–35)
AST: 19 U/L (ref 0–37)
Albumin: 4.3 g/dL (ref 3.5–5.2)
BUN: 13 mg/dL (ref 6–23)
CO2: 26 mEq/L (ref 19–32)
Calcium: 9.8 mg/dL (ref 8.4–10.5)
Chloride: 105 mEq/L (ref 96–112)
Creatinine, Ser: 0.95 mg/dL (ref 0.40–1.20)
GFR: 73.15 mL/min (ref >60.00)
GLUCOSE: 114 mg/dL — AB (ref 70–99)
Potassium: 4 mEq/L (ref 3.5–5.1)
Sodium: 139 mEq/L (ref 135–145)
Total Bilirubin: 0.4 mg/dL (ref 0.2–1.2)
Total Protein: 7.8 g/dL (ref 6.0–8.3)

## 2018-09-10 LAB — LIPID PANEL
Cholesterol: 174 mg/dL (ref 0–200)
HDL: 64.6 mg/dL (ref >39.00)
LDL Cholesterol: 96 mg/dL (ref 0–99)
NonHDL: 109.22
Total CHOL/HDL Ratio: 3
Triglycerides: 66 mg/dL (ref 0.0–149.0)
VLDL: 13.2 mg/dL (ref 0.0–40.0)

## 2018-09-10 LAB — HEMOGLOBIN A1C: HEMOGLOBIN A1C: 6.2 % (ref 4.6–6.5)

## 2018-09-10 LAB — CBC
HEMATOCRIT: 42.3 % (ref 36.0–46.0)
HEMOGLOBIN: 13.7 g/dL (ref 12.0–15.0)
MCHC: 32.4 g/dL (ref 30.0–36.0)
MCV: 83.5 fl (ref 78.0–100.0)
Platelets: 246 10*3/uL (ref 150.0–400.0)
RBC: 5.06 Mil/uL (ref 3.87–5.11)
RDW: 13.1 % (ref 11.5–15.5)
WBC: 5.9 10*3/uL (ref 4.0–10.5)

## 2018-09-10 LAB — TSH: TSH: 1.55 u[IU]/mL (ref 0.35–4.50)

## 2018-09-10 MED ORDER — PRAVASTATIN SODIUM 40 MG PO TABS: 40.0000 mg | ORAL_TABLET | Freq: Every day | ORAL | 3 refills | Status: DC

## 2018-09-10 NOTE — Assessment & Plan Note (Signed)
Flu shot up to date. Pneumonia up to date. Shingrix counseled. Tetanus up to date. Colonoscopy up to date. Mammogram up to date, pap smear aged out and dexa up to date. Counseled about sun safety and mole surveillance. Counseled about the dangers of distracted driving. Given 10 year screening recommendations.

## 2018-09-10 NOTE — Progress Notes (Signed)
   Subjective:    Patient ID: Jenny Madden, female    DOB: 01-24-1941, 77 y.o.   MRN: [id number]  HPI The patient is a 77 YO female coming in for physical.   PMH, [facility], social history reviewed and updated.   Review of Systems  Constitutional: Negative.   HENT: Negative.   Eyes: Negative.   Respiratory: Negative for cough, chest tightness and shortness of breath.   Cardiovascular: Negative for chest pain, palpitations and leg swelling.  Gastrointestinal: Negative for abdominal distention, abdominal pain, constipation, diarrhea, nausea and vomiting.  Musculoskeletal: Negative.   Skin: Negative.   Neurological: Negative.   Psychiatric/Behavioral: Negative.       Objective:   Physical Exam  Constitutional: She is oriented to person, place, and time. She appears well-developed and well-nourished.  HENT:  Head: Normocephalic and atraumatic.  Eyes: EOM are normal.  Neck: Normal range of motion.  Cardiovascular: Normal rate and regular rhythm.  Pulmonary/Chest: Effort normal and breath sounds normal. No respiratory distress. She has no wheezes. She has no rales.  Abdominal: Soft. Bowel sounds are normal. She exhibits no distension. There is no tenderness. There is no rebound.  Musculoskeletal: She exhibits no edema.  Neurological: She is alert and oriented to person, place, and time. Coordination normal.  Skin: Skin is warm and dry.  Psychiatric: She has a normal mood and affect.   Vitals:   09/10/18 0843  BP: 118/80  Pulse: 75  Temp: 98.6 F (37 C)  TempSrc: Oral  SpO2: 99%  Weight: 159 lb (72.1 kg)  Height: 5\' 3"  (1.6 m)      Assessment & Plan:

## 2018-09-10 NOTE — Assessment & Plan Note (Signed)
Checking HgA1c, insulin level (due to symptoms of hypoglycemia recently, no actual low sugars recorded), and microalbumin to creatinine ratio. Adjust as needed. Currently diet controlled.

## 2018-09-10 NOTE — Assessment & Plan Note (Signed)
Change simvastatin to pravastatin due to interaction with amlodipine and borderline cholesterol control on recent check. Checking lipid panel and adjust as needed.

## 2018-09-10 NOTE — Assessment & Plan Note (Signed)
BP at goal on her amlodipine 10 mg daily. Checking CMP and adjust as needed.

## 2018-09-10 NOTE — Patient Instructions (Signed)
We are changing the cholesterol medicine to pravastatin instead of simvastatin. It is still 1 pill daily.   Health Maintenance, Female Adopting a healthy lifestyle and getting preventive care can go a long way to promote health and wellness. Talk with your health care provider about what schedule of regular examinations is right for you. This is a good chance for you to check in with your provider about disease prevention and staying healthy. In between checkups, there are plenty of things you can do on your own. Experts have done a lot of research about which lifestyle changes and preventive measures are most likely to keep you healthy. Ask your health care provider for more information. Weight and diet Eat a healthy diet  Be sure to include plenty of vegetables, fruits, low-fat dairy products, and lean protein.  Do not eat a lot of foods high in solid fats, added sugars, or salt.  Get regular exercise. This is one of the most important things you can do for your health. ? Most adults should exercise for at least 150 minutes each week. The exercise should increase your heart rate and make you sweat (moderate-intensity exercise). ? Most adults should also do strengthening exercises at least twice a week. This is in addition to the moderate-intensity exercise.  Maintain a healthy weight  Body mass index (BMI) is a measurement that can be used to identify possible weight problems. It estimates body fat based on height and weight. Your health care provider can help determine your BMI and help you achieve or maintain a healthy weight.  For females 51 years of age and older: ? A BMI below 18.5 is considered underweight. ? A BMI of 18.5 to 24.9 is normal. ? A BMI of 25 to 29.9 is considered overweight. ? A BMI of 30 and above is considered obese.  Watch levels of cholesterol and blood lipids  You should start having your blood tested for lipids and cholesterol at 78 years of age, then have this  test every 5 years.  You may need to have your cholesterol levels checked more often if: ? Your lipid or cholesterol levels are high. ? You are older than 78 years of age. ? You are at high risk for heart disease.  Cancer screening Lung Cancer  Lung cancer screening is recommended for adults 62-29 years old who are at high risk for lung cancer because of a history of smoking.  A yearly low-dose CT scan of the lungs is recommended for people who: ? Currently smoke. ? Have quit within the past 15 years. ? Have at least a 30-pack-year history of smoking. A pack year is smoking an average of one pack of cigarettes a day for 1 year.  Yearly screening should continue until it has been 15 years since you quit.  Yearly screening should stop if you develop a health problem that would prevent you from having lung cancer treatment.  Breast Cancer  Practice breast self-awareness. This means understanding how your breasts normally appear and feel.  It also means doing regular breast self-exams. Let your health care provider know about any changes, no matter how small.  If you are in your 20s or 30s, you should have a clinical breast exam (CBE) by a health care provider every 1-3 years as part of a regular health exam.  If you are 4 or older, have a CBE every year. Also consider having a breast X-ray (mammogram) every year.  If you have a family history  of breast cancer, talk to your health care provider about genetic screening.  If you are at high risk for breast cancer, talk to your health care provider about having an MRI and a mammogram every year.  Breast cancer gene (BRCA) assessment is recommended for women who have family members with BRCA-related cancers. BRCA-related cancers include: ? Breast. ? Ovarian. ? Tubal. ? Peritoneal cancers.  Results of the assessment will determine the need for genetic counseling and BRCA1 and BRCA2 testing.  Cervical Cancer Your health care  provider may recommend that you be screened regularly for cancer of the pelvic organs (ovaries, uterus, and vagina). This screening involves a pelvic examination, including checking for microscopic changes to the surface of your cervix (Pap test). You may be encouraged to have this screening done every 3 years, beginning at age 20.  For women ages 58-65, health care providers may recommend pelvic exams and Pap testing every 3 years, or they may recommend the Pap and pelvic exam, combined with testing for human papilloma virus (HPV), every 5 years. Some types of HPV increase your risk of cervical cancer. Testing for HPV may also be done on women of any age with unclear Pap test results.  Other health care providers may not recommend any screening for nonpregnant women who are considered low risk for pelvic cancer and who do not have symptoms. Ask your health care provider if a screening pelvic exam is right for you.  If you have had past treatment for cervical cancer or a condition that could lead to cancer, you need Pap tests and screening for cancer for at least 20 years after your treatment. If Pap tests have been discontinued, your risk factors (such as having a new sexual partner) need to be reassessed to determine if screening should resume. Some women have medical problems that increase the chance of getting cervical cancer. In these cases, your health care provider may recommend more frequent screening and Pap tests.  Colorectal Cancer  This type of cancer can be detected and often prevented.  Routine colorectal cancer screening usually begins at 78 years of age and continues through 78 years of age.  Your health care provider may recommend screening at an earlier age if you have risk factors for colon cancer.  Your health care provider may also recommend using home test kits to check for hidden blood in the stool.  A small camera at the end of a tube can be used to examine your colon  directly (sigmoidoscopy or colonoscopy). This is done to check for the earliest forms of colorectal cancer.  Routine screening usually begins at age 27.  Direct examination of the colon should be repeated every 5-10 years through 78 years of age. However, you may need to be screened more often if early forms of precancerous polyps or small growths are found.  Skin Cancer  Check your skin from head to toe regularly.  Tell your health care provider about any new moles or changes in moles, especially if there is a change in a mole's shape or color.  Also tell your health care provider if you have a mole that is larger than the size of a pencil eraser.  Always use sunscreen. Apply sunscreen liberally and repeatedly throughout the day.  Protect yourself by wearing long sleeves, pants, a wide-brimmed hat, and sunglasses whenever you are outside.  Heart disease, diabetes, and high blood pressure  High blood pressure causes heart disease and increases the risk of stroke. High  blood pressure is more likely to develop in: ? People who have blood pressure in the high end of the normal range (130-139/85-89 mm Hg). ? People who are overweight or obese. ? People who are African American.  If you are 90-2 years of age, have your blood pressure checked every 3-5 years. If you are 53 years of age or older, have your blood pressure checked every year. You should have your blood pressure measured twice-once when you are at a hospital or clinic, and once when you are not at a hospital or clinic. Record the average of the two measurements. To check your blood pressure when you are not at a hospital or clinic, you can use: ? An automated blood pressure machine at a pharmacy. ? A home blood pressure monitor.  If you are between 37 years and 1 years old, ask your health care provider if you should take aspirin to prevent strokes.  Have regular diabetes screenings. This involves taking a blood sample to  check your fasting blood sugar level. ? If you are at a normal weight and have a low risk for diabetes, have this test once every three years after 78 years of age. ? If you are overweight and have a high risk for diabetes, consider being tested at a younger age or more often. Preventing infection Hepatitis B  If you have a higher risk for hepatitis B, you should be screened for this virus. You are considered at high risk for hepatitis B if: ? You were born in a country where hepatitis B is common. Ask your health care provider which countries are considered high risk. ? Your parents were born in a high-risk country, and you have not been immunized against hepatitis B (hepatitis B vaccine). ? You have HIV or AIDS. ? You use needles to inject street drugs. ? You live with someone who has hepatitis B. ? You have had sex with someone who has hepatitis B. ? You get hemodialysis treatment. ? You take certain medicines for conditions, including cancer, organ transplantation, and autoimmune conditions.  Hepatitis C  Blood testing is recommended for: ? Everyone born from [birth year] through 1966. ? Anyone with known risk factors for hepatitis C.  Sexually transmitted infections (STIs)  You should be screened for sexually transmitted infections (STIs) including gonorrhea and chlamydia if: ? You are sexually active and are younger than 78 years of age. ? You are older than 78 years of age and your health care provider tells you that you are at risk for this type of infection. ? Your sexual activity has changed since you were last screened and you are at an increased risk for chlamydia or gonorrhea. Ask your health care provider if you are at risk.  If you do not have HIV, but are at risk, it may be recommended that you take a prescription medicine daily to prevent HIV infection. This is called pre-exposure prophylaxis (PrEP). You are considered at risk if: ? You are sexually active and do not regularly  use condoms or know the HIV status of your partner(s). ? You take drugs by injection. ? You are sexually active with a partner who has HIV.  Talk with your health care provider about whether you are at high risk of being infected with HIV. If you choose to begin PrEP, you should first be tested for HIV. You should then be tested every 3 months for as long as you are taking PrEP. Pregnancy  If you are  premenopausal and you may become pregnant, ask your health care provider about preconception counseling.  If you may become pregnant, take 400 to 800 micrograms (mcg) of folic acid every day.  If you want to prevent pregnancy, talk to your health care provider about birth control (contraception). Osteoporosis and menopause  Osteoporosis is a disease in which the bones lose minerals and strength with aging. This can result in serious bone fractures. Your risk for osteoporosis can be identified using a bone density scan.  If you are 23 years of age or older, or if you are at risk for osteoporosis and fractures, ask your health care provider if you should be screened.  Ask your health care provider whether you should take a calcium or vitamin D supplement to lower your risk for osteoporosis.  Menopause may have certain physical symptoms and risks.  Hormone replacement therapy may reduce some of these symptoms and risks. Talk to your health care provider about whether hormone replacement therapy is right for you. Follow these instructions at home:  Schedule regular health, dental, and eye exams.  Stay current with your immunizations.  Do not use any tobacco products including cigarettes, chewing tobacco, or electronic cigarettes.  If you are pregnant, do not drink alcohol.  If you are breastfeeding, limit how much and how often you drink alcohol.  Limit alcohol intake to no more than 1 drink per day for nonpregnant women. One drink equals 12 ounces of beer, 5 ounces of wine, or 1 ounces  of hard liquor.  Do not use street drugs.  Do not share needles.  Ask your health care provider for help if you need support or information about quitting drugs.  Tell your health care provider if you often feel depressed.  Tell your health care provider if you have ever been abused or do not feel safe at home. This information is not intended to replace advice given to you by your health care provider. Make sure you discuss any questions you have with your health care provider. Document Released: 04/09/2011 Document Revised: 03/01/2016 Document Reviewed: 06/28/2015 Elsevier Interactive Patient Education  [employer].

## 2018-09-16 ENCOUNTER — Encounter: Payer: Self-pay | Admitting: Internal Medicine

## 2018-09-16 NOTE — Progress Notes (Deleted)
Subjective:   Jenny Madden is a 77 y.o. female who presents for Medicare Annual (Subsequent) preventive examination.  Review of Systems:  No ROS.  Medicare Wellness Visit. Additional risk factors are reflected in the social history.    Sleep patterns: {SX; SLEEP PATTERNS:18802::"feels rested on waking","does not get up to void","gets up *** times nightly to void","sleeps *** hours nightly"}.    Home Safety/Smoke Alarms: Feels safe in home. Smoke alarms in place.  Living environment; residence and Firearm Safety: {Rehab home environment / accessibility:30080::"no firearms","firearms stored safely"}. Seat Belt Safety/Bike Helmet: Wears seat belt.     Objective:     Vitals: There were no vitals taken for this visit.  There is no height or weight on file to calculate BMI.  Advanced Directives 04/24/2018 01/10/2018 10/31/2016 05/04/2016 01/18/2016 01/12/2016 10/01/2015  Does Patient Have a Medical Advance Directive? No No No Yes No No No  Type of Advance Directive - - - Living will - - -  Would patient like information on creating a medical advance directive? - - - - No - patient declined information No - patient declined information No - patient declined information    Tobacco Social History   Tobacco Use  Smoking Status Former Smoker  . Types: Cigarettes  . Last attempt to quit: 10/09/1999  . Years since quitting: 18.9  Smokeless Tobacco Never Used     Counseling given: Not Answered  Past Medical History:  Diagnosis Date  . Allergic rhinitis, cause unspecified   . Anxiety state, unspecified    none recent  . Benign neoplasm of colon   . Cancer ([place])    renal mass  . Cataract   . Chronic kidney disease 01/2016   Tumor left side  . Diverticulosis of colon (without mention of hemorrhage)   . Dysphagia   . Family history of adverse reaction to anesthesia 3 yrs ago   slow to awaken   . Family history of malignant neoplasm of gastrointestinal tract   . Heart murmur   .  Hypertension   . Nonspecific (abnormal) findings on radiological and other examination of other intrathoracic organs   . Nonspecific abnormal electrocardiogram (ECG) (EKG)   . Osteoporosis   . Other dysphagia   . Pure hypercholesterolemia   . Type II or unspecified type diabetes mellitus without mention of complication, not stated as uncontrolled   . Unspecified essential hypertension   . Unspecified tinnitus years ago   Past Surgical History:  Procedure Laterality Date  . ABLATION Left 01/2016   Renal mass  . CESAREAN SECTION     x 1  . COLONOSCOPY    . colonscopy and endoscopy  5 yrs ago  . FOOT SURGERY     bilateral for hammer toes  . IR GENERIC HISTORICAL  12/06/2015   IR RADIOLOGIST EVAL & MGMT 12/06/2015 Aletta Edouard, MD [facility] INTERV RAD  . IR GENERIC HISTORICAL  05/03/2016   IR RADIOLOGIST EVAL & MGMT 05/03/2016 Aletta Edouard, MD [facility] INTERV RAD  . IR GENERIC HISTORICAL  02/09/2016   IR RADIOLOGIST EVAL & MGMT 02/09/2016 [facility] INTERV RAD  . IR RADIOLOGIST EVAL & MGMT  01/09/2017  . IR RADIOLOGIST EVAL & MGMT  01/14/2018  . UPPER GASTROINTESTINAL ENDOSCOPY     Family History  Problem Relation Age of Onset  . Stroke Father   . Lung cancer Father   . Coronary artery disease Father   . Hypertension Mother   . Heart attack Mother   . Colon  cancer Brother 24  . Esophageal cancer Neg Hx   . Rectal cancer Neg Hx   . Stomach cancer Neg Hx    Social History   Socioeconomic History  . Marital status: Married    Spouse name: bob x 37 yrs  . Number of children: 3  . Years of education: Not on file  . Highest education level: Not on file  Occupational History  . Occupation: Airline pilot: RETIRED  Social Needs  . Financial resource strain: Not on file  . Food insecurity:    Worry: Not on file    Inability: Not on file  . Transportation needs:    Medical: Not on file    Non-medical: Not on file  Tobacco Use  . Smoking status: Former Smoker    Types: Cigarettes     Last attempt to quit: 10/09/1999    Years since quitting: 18.9  . Smokeless tobacco: Never Used  Substance and Sexual Activity  . Alcohol use: Yes    Alcohol/week: 1.0 standard drinks    Types: 1 Standard drinks or equivalent per week    Comment: social use  . Drug use: No  . Sexual activity: Never    Comment: 1st intercourse- 21, partners- 3, widow   Lifestyle  . Physical activity:    Days per week: Not on file    Minutes per session: Not on file  . Stress: Not on file  Relationships  . Social connections:    Talks on phone: Not on file    Gets together: Not on file    Attends religious service: Not on file    Active member of club or organization: Not on file    Attends meetings of clubs or organizations: Not on file    Relationship status: Not on file  Other Topics Concern  . Not on file  Social History Narrative   Married to husband, [name] x14yr   3 children - 1 son w/ Hodgkin's, 2 daughters alive and well   High school education    Outpatient Encounter Medications as of 09/17/2018  Medication Sig  . amLODipine (NORVASC) 10 MG tablet TAKE 1 TABLET DAILY  . aspirin 81 MG tablet Take 81 mg by mouth every morning.   . blood glucose meter kit and supplies Dispense based on patient and insurance preference. Use up to four times daily as directed. (E11.9).  .Marland KitchenBlood Glucose Monitoring Suppl (ONE TOUCH ULTRA 2) w/Device KIT USE TO CHECK BLOOD SUGAR   TWO TIMES A DAY  . Cholecalciferol (VITAMIN D) 2000 UNITS CAPS Take 2,000 Units by mouth daily.   . famotidine (PEPCID) 40 MG tablet Take 1 tablet (40 mg total) by mouth daily.  . fexofenadine (ALLEGRA) 180 MG tablet Take 1 tablet (180 mg total) by mouth daily. Take 1 tablet by mouth once daily as needed for allergies  . fluticasone (FLONASE) 50 MCG/ACT nasal spray Place 2 sprays into both nostrils daily.  .Marland Kitchenglucose blood (ONE TOUCH ULTRA TEST) test strip Used to check blood sugars twice daily  . omeprazole (PRILOSEC) 40 MG capsule  Take 1 capsule (40 mg total) by mouth 2 (two) times daily before a meal.  . ONE TOUCH LANCETS MISC Used to check blood sugars twice daily  . Polyethyl Glycol-Propyl Glycol (SYSTANE OP) Apply 1-2 drops to eye every morning.  . pravastatin (PRAVACHOL) 40 MG tablet Take 1 tablet (40 mg total) by mouth daily.  . [DISCONTINUED] Alum & Mag Hydroxide-Simeth (MAGIC MOUTHWASH)  SOLN 1 teaspoon gargle and swallow four times a day   . [DISCONTINUED] pantoprazole (PROTONIX) 40 MG tablet Take 1 tablet (40 mg total) by mouth daily.   No facility-administered encounter medications on file as of 09/17/2018.     Activities of Daily Living No flowsheet data found.  Patient Care Team: Hoyt Koch, MD as PCP - General (Internal Medicine)    Assessment:   This is a routine wellness examination for Jenny Madden. Physical assessment deferred to PCP.   Exercise Activities and Dietary recommendations   Diet (meal preparation, eat out, water intake, caffeinated beverages, dairy products, fruits and vegetables): {Desc; diets:16563}   Goals   None     Fall Risk Fall Risk  09/10/2018 09/03/2017 01/31/2016 12/31/2012  Falls in the past year? 0 No No No    Depression Screen PHQ 2/9 Scores 09/10/2018 09/03/2017 01/31/2016 12/31/2012  PHQ - 2 Score 0 1 0 0     Cognitive Function        Immunization History  Administered Date(s) Administered  . H1N1 09/30/2008  . Influenza Split 06/28/2011, 07/02/2012  . Influenza Whole 06/30/2008, 07/15/2009, 06/28/2010  . Influenza, High Dose Seasonal PF 06/21/2016, 07/05/2017  . Influenza,inj,Quad PF,6+ Mos 07/07/2013, 07/09/2014, 07/13/2015  . Influenza-Unspecified 06/02/2018  . Pneumococcal Conjugate-13 01/12/2015  . Pneumococcal Polysaccharide-23 06/28/2009  . Td 12/28/2009    Qualifies for Shingles Vaccine?  Screening Tests Health Maintenance  Topic Date Due  . OPHTHALMOLOGY EXAM  09/24/2018  . HEMOGLOBIN A1C  03/12/2019  . FOOT EXAM  07/12/2019    . URINE MICROALBUMIN  09/11/2019  . TETANUS/TDAP  12/29/2019  . COLONOSCOPY  11/07/2021  . INFLUENZA VACCINE  Completed  . DEXA SCAN  Completed  . PNA vac Low Risk Adult  Completed      Plan:      I have personally reviewed and noted the following in the patient's chart:   . Medical and social history . Use of alcohol, tobacco or illicit drugs  . Current medications and supplements . Functional ability and status . Nutritional status . Physical activity . Advanced directives . List of other physicians . Vitals . Screenings to include cognitive, depression, and falls . Referrals and appointments  In addition, I have reviewed and discussed with patient certain preventive protocols, quality metrics, and best practice recommendations. A written personalized care plan for preventive services as well as general preventive health recommendations were provided to patient.     Michiel Cowboy, RN  09/16/2018

## 2018-09-17 ENCOUNTER — Ambulatory Visit: Payer: Medicare HMO

## 2018-09-17 ENCOUNTER — Encounter: Payer: Self-pay | Admitting: Internal Medicine

## 2018-09-17 ENCOUNTER — Encounter: Payer: Medicare HMO | Admitting: Internal Medicine

## 2018-09-18 ENCOUNTER — Encounter: Payer: Self-pay | Admitting: Internal Medicine

## 2018-09-18 LAB — INSULIN, FREE (BIOACTIVE): INSULIN, FREE: 9.1 u[IU]/mL (ref 1.5–14.9)

## 2018-09-19 ENCOUNTER — Encounter: Payer: Self-pay | Admitting: Internal Medicine

## 2018-10-07 DIAGNOSIS — H40023 Open angle with borderline findings, high risk, bilateral: Secondary | ICD-10-CM | POA: Diagnosis not present

## 2018-10-07 DIAGNOSIS — H25013 Cortical age-related cataract, bilateral: Secondary | ICD-10-CM | POA: Diagnosis not present

## 2018-10-07 DIAGNOSIS — E119 Type 2 diabetes mellitus without complications: Secondary | ICD-10-CM | POA: Diagnosis not present

## 2018-10-07 DIAGNOSIS — H25042 Posterior subcapsular polar age-related cataract, left eye: Secondary | ICD-10-CM | POA: Diagnosis not present

## 2018-10-09 DIAGNOSIS — R69 Illness, unspecified: Secondary | ICD-10-CM | POA: Diagnosis not present

## 2018-10-14 ENCOUNTER — Encounter: Payer: Self-pay | Admitting: Internal Medicine

## 2018-10-15 ENCOUNTER — Telehealth: Payer: Self-pay

## 2018-10-15 NOTE — Telephone Encounter (Signed)
Copied from [place] [id number]. Topic: General - Inquiry >> Oct 14, 2018 12:40 PM Jenny Madden wrote: Reason for CRM: Patient said she would like the nurse to call her regarding all the mychart messages she has been sending the last few days to Dr Sharlet Salina

## 2018-10-15 NOTE — Telephone Encounter (Signed)
Patient called back and was just wondering about the bright red blood when wiping. States that it has since stopped and everything was fine, I informed patient that it could be hemorrhoid related and that if she is having any dark tarry stool or lots of blood to let us know.  Patient also wanted to know if she should continue taking her pravastatin every other day or go back to daily? Patient stated that her leg cramps seem to be better but she still does get them in the morning when she stretches her legs.

## 2018-10-15 NOTE — Telephone Encounter (Signed)
LVM for patient to call back to discuss her mychart messages with Dr. Sharlet Salina.

## 2018-10-16 NOTE — Telephone Encounter (Signed)
Can stay on every other day for now.

## 2018-10-16 NOTE — Telephone Encounter (Signed)
LVM informing patient of MD response  

## 2018-10-21 DIAGNOSIS — R69 Illness, unspecified: Secondary | ICD-10-CM | POA: Diagnosis not present

## 2018-10-24 DIAGNOSIS — E119 Type 2 diabetes mellitus without complications: Secondary | ICD-10-CM | POA: Diagnosis not present

## 2018-10-24 DIAGNOSIS — H25012 Cortical age-related cataract, left eye: Secondary | ICD-10-CM | POA: Diagnosis not present

## 2018-10-24 DIAGNOSIS — H2513 Age-related nuclear cataract, bilateral: Secondary | ICD-10-CM | POA: Diagnosis not present

## 2018-10-24 DIAGNOSIS — H25042 Posterior subcapsular polar age-related cataract, left eye: Secondary | ICD-10-CM | POA: Diagnosis not present

## 2018-10-24 DIAGNOSIS — H40023 Open angle with borderline findings, high risk, bilateral: Secondary | ICD-10-CM | POA: Diagnosis not present

## 2018-10-24 DIAGNOSIS — H25013 Cortical age-related cataract, bilateral: Secondary | ICD-10-CM | POA: Diagnosis not present

## 2018-10-24 DIAGNOSIS — H2512 Age-related nuclear cataract, left eye: Secondary | ICD-10-CM | POA: Diagnosis not present

## 2018-11-12 DIAGNOSIS — H2512 Age-related nuclear cataract, left eye: Secondary | ICD-10-CM | POA: Diagnosis not present

## 2018-11-12 DIAGNOSIS — H25812 Combined forms of age-related cataract, left eye: Secondary | ICD-10-CM | POA: Diagnosis not present

## 2018-11-17 DIAGNOSIS — H25011 Cortical age-related cataract, right eye: Secondary | ICD-10-CM | POA: Diagnosis not present

## 2018-11-17 DIAGNOSIS — H2511 Age-related nuclear cataract, right eye: Secondary | ICD-10-CM | POA: Diagnosis not present

## 2018-11-19 DIAGNOSIS — R69 Illness, unspecified: Secondary | ICD-10-CM | POA: Diagnosis not present

## 2018-11-25 ENCOUNTER — Other Ambulatory Visit: Payer: Self-pay | Admitting: Internal Medicine

## 2018-11-25 NOTE — Telephone Encounter (Signed)
Copied from [place] [id number]. Topic: Quick Communication - Rx Refill/Question >> Nov 25, 2018 12:07 PM Jenny Madden wrote: Medication: famotidine (PEPCID) 40 MG tablet -   Has the patient contacted their pharmacy? Yes [facility] stated they may no longer carry so Patient is ok with refills going to either pharmacy as long as they carry the medication   Preferred Pharmacy (with phone number or street name): [id number] [address], [place] [phone] (Phone) [phone] (Fax) Or  [facility] [place] [facility] [phone] (Phone) [phone] (Fax)    Agent: Please be advised that RX refills may take up to 3 business days. We ask that you follow-up with your pharmacy.

## 2018-11-25 NOTE — Telephone Encounter (Signed)
Requested Prescriptions  Pending Prescriptions Disp Refills  . famotidine (PEPCID) 40 MG tablet [Pharmacy Med Name: FAMOTIDINE 40 MG TABLET] 90 tablet 0    Sig: TAKE 1 TABLET BY MOUTH EVERY DAY     Gastroenterology:  H2 Antagonists Passed - 11/25/2018 12:10 PM      Passed - Valid encounter within last 12 months    Recent Outpatient Visits          2 months ago Routine general medical examination at a health care facility   [place], Elizabeth A, MD   4 months ago Type 2 diabetes mellitus without complication, without long-term current use of insulin ([place])   [place], Elizabeth A, MD   4 months ago Gastroesophageal reflux disease, esophagitis presence not specified   [place], MD   5 months ago Type 2 diabetes mellitus without complication, without long-term current use of insulin ([place])   [place], MD   6 months ago Elevated lipase   [place], [place], MD      Future Appointments            In 2 months  [place], [place]   In 3 months Sharlet Salina, Real Cons, MD [place], [facility]

## 2018-11-26 ENCOUNTER — Other Ambulatory Visit: Payer: Self-pay

## 2018-11-26 DIAGNOSIS — H2511 Age-related nuclear cataract, right eye: Secondary | ICD-10-CM | POA: Diagnosis not present

## 2018-11-26 DIAGNOSIS — H25811 Combined forms of age-related cataract, right eye: Secondary | ICD-10-CM | POA: Diagnosis not present

## 2018-11-26 MED ORDER — FAMOTIDINE 40 MG PO TABS: 40.0000 mg | ORAL_TABLET | Freq: Every day | ORAL | 1 refills | Status: DC

## 2018-12-16 ENCOUNTER — Ambulatory Visit (INDEPENDENT_AMBULATORY_CARE_PROVIDER_SITE_OTHER): Payer: Medicare HMO | Admitting: Internal Medicine

## 2018-12-16 ENCOUNTER — Other Ambulatory Visit (INDEPENDENT_AMBULATORY_CARE_PROVIDER_SITE_OTHER): Payer: Medicare HMO

## 2018-12-16 ENCOUNTER — Encounter: Payer: Self-pay | Admitting: Internal Medicine

## 2018-12-16 ENCOUNTER — Other Ambulatory Visit: Payer: Self-pay | Admitting: Interventional Radiology

## 2018-12-16 ENCOUNTER — Other Ambulatory Visit: Payer: Self-pay | Admitting: *Deleted

## 2018-12-16 VITALS — BP 118/60 | HR 83 | Temp 97.8°F | Ht 63.0 in | Wt 159.0 lb

## 2018-12-16 DIAGNOSIS — E785 Hyperlipidemia, unspecified: Secondary | ICD-10-CM

## 2018-12-16 DIAGNOSIS — N2889 Other specified disorders of kidney and ureter: Secondary | ICD-10-CM

## 2018-12-16 DIAGNOSIS — E1169 Type 2 diabetes mellitus with other specified complication: Secondary | ICD-10-CM | POA: Diagnosis not present

## 2018-12-16 DIAGNOSIS — M25511 Pain in right shoulder: Secondary | ICD-10-CM

## 2018-12-16 LAB — LIPID PANEL
Cholesterol: 204 mg/dL — ABNORMAL HIGH (ref 0–200)
HDL: 64.9 mg/dL (ref >39.00)
LDL Cholesterol: 116 mg/dL — ABNORMAL HIGH (ref 0–99)
NonHDL: 138.81
TRIGLYCERIDES: 116 mg/dL (ref 0.0–149.0)
Total CHOL/HDL Ratio: 3
VLDL: 23.2 mg/dL (ref 0.0–40.0)

## 2018-12-16 MED ORDER — METHYLPREDNISOLONE ACETATE 40 MG/ML IJ SUSP
40.0000 mg | Freq: Once | INTRAMUSCULAR | Status: AC
  Administered 2018-12-16: 40 mg via INTRAMUSCULAR

## 2018-12-16 NOTE — Addendum Note (Signed)
Addended by: Raford Pitcher R on: 12/16/2018 10:26 AM   Modules accepted: Orders

## 2018-12-16 NOTE — Assessment & Plan Note (Signed)
Depo-medrol 40 mg IM given at visit. Advised if no resolution needs to see sports medicine for shoulder injection. Given ROM exercises.

## 2018-12-16 NOTE — Assessment & Plan Note (Signed)
Checking lipid panel and adjust as needed her pravastatin she is taking every other day now.

## 2018-12-16 NOTE — Patient Instructions (Addendum)
We have given you a shot today. If this does not help you can see sports medicine for an injection in the shoulder.    Shoulder Range of Motion Exercises Shoulder range of motion (ROM) exercises are done to keep the shoulder moving freely or to increase movement. They are often recommended for people who have shoulder pain or stiffness or who are recovering from a shoulder surgery. Phase 1 exercises When you are able, do this exercise 1-2 times per day for 30-60 seconds in each direction, or as directed by your health care provider. Pendulum exercise To do this exercise while sitting: 1. Sit in a chair or at the edge of your bed with your feet flat on the floor. 2. Let your affected arm hang down in front of you over the edge of the bed or chair. 3. Relax your shoulder, arm, and hand. [place] your body so your arm gently swings in small circles. You can also use your unaffected arm to start the motion. 5. Repeat changing the direction of the circles, swinging your arm left and right, and swinging your arm forward and back. To do this exercise while standing: 1. Stand next to a sturdy chair or table, and hold on to it with your hand on your unaffected side. 2. Bend forward at the waist. 3. Bend your knees slightly. 4. Relax your shoulder, arm, and hand. 5. While keeping your shoulder relaxed, use body motion to swing your arm in small circles. 6. Repeat changing the direction of the circles, swinging your arm left and right, and swinging your arm forward and back. 7. Between exercises, stand up tall and take a short break to relax your lower back.  Phase 2 exercises Do these exercises 1-2 times per day or as told by your health care provider. Hold each stretch for 30 seconds, and repeat 3 times. Do the exercises with one or both arms as instructed by your health care provider. For these exercises, sit at a table with your hand and arm supported by the table. A chair that slides easily or has  wheels can be helpful. External rotation 1. Turn your chair so that your affected side is nearest to the table. 2. Place your forearm on the table to your side. Bend your elbow about 90 at the elbow (right angle) and place your hand palm facing down on the table. Your elbow should be about 6 inches away from your side. 3. Keeping your arm on the table, lean your body forward. Abduction 1. Turn your chair so that your affected side is nearest to the table. 2. Place your forearm and hand on the table so that your thumb points toward the ceiling and your arm is straight out to your side. 3. Slide your hand out to the side and away from you, using your unaffected arm to do the work. 4. To increase the stretch, you can slide your chair away from the table. Flexion: forward stretch 1. Sit facing the table. Place your hand and elbow on the table in front of you. 2. Slide your hand forward and away from you, using your unaffected arm to do the work. 3. To increase the stretch, you can slide your chair backward. Phase 3 exercises Do these exercises 1-2 times per day or as told by your health care provider. Hold each stretch for 30 seconds, and repeat 3 times. Do the exercises with one or both arms as instructed by your health care provider. Cross-body stretch:  posterior capsule stretch 1. Lift your arm straight out in front of you. 2. Bend your arm 90 at the elbow (right angle) so your forearm moves across your body. 3. Use your other arm to gently pull the elbow across your body, toward your other shoulder. Wall climbs 1. Stand with your affected arm extended out to the side with your hand resting on a door frame. 2. Slide your hand slowly up the door frame. 3. To increase the stretch, step through the door frame. Keep your body upright and do not lean. Wand exercises You will need a cane, a piece of PVC pipe, or a sturdy wooden dowel for wand exercises. Flexion To do this exercise while  standing: 1. Hold the wand with both of your hands, palms down. 2. Using the other arm to help, lift your arms up and over your head, if able. 3. Push upward with your other arm to gently increase the stretch. To do this exercise while lying down: 1. Lie on your back with your elbows resting on the floor and the wand in both your hands. Your hands will be palm down, or pointing toward your feet. 2. Lift your hands toward the ceiling, using your unaffected arm to help if needed. 3. Bring your arms overhead as able, using your unaffected arm to help if needed. Internal rotation 1. Stand while holding the wand behind you with both hands. Your unaffected arm should be extended above your head with the arm of the affected side extended behind you at the level of your waist. The wand should be pointing straight up and down as you hold it. 2. Slowly pull the wand up behind your back by straightening the elbow of your unaffected arm and bending the elbow of your affected arm. External rotation 1. Lie on your back with your affected upper arm supported on a small pillow or rolled towel. When you first do this exercise, keep your upper arm close to your body. Over time, bring your arm up to a 90 angle out to the side. 2. Hold the wand across your stomach and with both hands palm up. Your elbow on your affected side should be bent at a 90 angle. 3. Use your unaffected side to help push your forearm away from you and toward the floor. Keep your elbow on your affected side bent at a 90 angle. Contact a health care provider if you have:  New or increasing pain.  New numbness, tingling, weakness, or discoloration in your arm or hand. This information is not intended to replace advice given to you by your health care provider. Make sure you discuss any questions you have with your health care provider. Document Released: 06/23/2003 Document Revised: 11/06/2017 Document Reviewed: 11/06/2017 Elsevier  Interactive Patient Education  2019 [employer].

## 2018-12-16 NOTE — Progress Notes (Signed)
   Subjective:   Patient ID: Jenny Madden, female    DOB: 03-30-1941, 78 y.o.   MRN: [id number]  HPI The patient is a 78 YO female coming in for several concerns including cholesterol (has switched to pravastatin every other day, muscle problems better, wants to know if she should keep this dose or change back to daily, denies chest pains or SOB), and new right shoulder pain (lifted her garbage a large bag about 2 months ago and pain started right after, was severe for 1-2 weeks, then improved some, denies constant pain now, still episodes of pain with activity, yesterday okay but before 6/10 pain all day, has taken aleve a couple of times which helps temporarily but overall is not changing symptoms, denies numbness or weakness in her right arm, denies chest pains).   Review of Systems  Constitutional: Positive for activity change.  HENT: Negative.   Eyes: Negative.   Respiratory: Negative for cough, chest tightness and shortness of breath.   Cardiovascular: Negative for chest pain, palpitations and leg swelling.  Gastrointestinal: Negative for abdominal distention, abdominal pain, constipation, diarrhea, nausea and vomiting.  Musculoskeletal: Positive for arthralgias and myalgias. Negative for back pain, gait problem, joint swelling, neck pain and neck stiffness.  Skin: Negative.   Neurological: Negative.   Psychiatric/Behavioral: Negative.     Objective:  Physical Exam Constitutional:      Appearance: She is well-developed.  HENT:     Head: Normocephalic and atraumatic.  Neck:     Musculoskeletal: Normal range of motion.  Cardiovascular:     Rate and Rhythm: Normal rate and regular rhythm.  Pulmonary:     Effort: Pulmonary effort is normal. No respiratory distress.     Breath sounds: Normal breath sounds. No wheezing or rales.  Abdominal:     General: Bowel sounds are normal. There is no distension.     Palpations: Abdomen is soft.     Tenderness: There is no abdominal  tenderness. There is no rebound.  Musculoskeletal:        General: Tenderness present.     Comments: Right AC tenderness  Skin:    General: Skin is warm and dry.  Neurological:     Mental Status: She is alert and oriented to person, place, and time.     Coordination: Coordination normal.     Vitals:   12/16/18 0945  BP: 118/60  Pulse: 83  Temp: 97.8 F (36.6 C)  TempSrc: Oral  SpO2: 97%  Weight: 159 lb (72.1 kg)  Height: 5\' 3"  (1.6 m)    Assessment & Plan:  Depo-medrol 40 mg IM given at visit.

## 2018-12-18 ENCOUNTER — Encounter: Payer: Self-pay | Admitting: Internal Medicine

## 2018-12-21 ENCOUNTER — Other Ambulatory Visit: Payer: Self-pay

## 2018-12-21 ENCOUNTER — Ambulatory Visit (HOSPITAL_COMMUNITY)
Admission: EM | Admit: 2018-12-21 | Discharge: 2018-12-21 | Disposition: A | Discharge status: Home / Self Care | Payer: Medicare HMO | Attending: Family Medicine | Admitting: Family Medicine

## 2018-12-21 ENCOUNTER — Encounter (HOSPITAL_COMMUNITY): Payer: Self-pay | Admitting: Emergency Medicine

## 2018-12-21 DIAGNOSIS — R110 Nausea: Secondary | ICD-10-CM

## 2018-12-21 DIAGNOSIS — R5383 Other fatigue: Secondary | ICD-10-CM | POA: Diagnosis not present

## 2018-12-21 DIAGNOSIS — R42 Dizziness and giddiness: Secondary | ICD-10-CM

## 2018-12-21 LAB — POCT URINALYSIS DIP (DEVICE)
Bilirubin Urine: NEGATIVE
Glucose, UA: NEGATIVE mg/dL
Hgb urine dipstick: NEGATIVE
KETONES UR: NEGATIVE mg/dL
Leukocytes,Ua: NEGATIVE
Nitrite: NEGATIVE
PROTEIN: NEGATIVE mg/dL
Specific Gravity, Urine: 1.015 (ref 1.005–1.030)
Urobilinogen, UA: 0.2 mg/dL (ref 0.0–1.0)
pH: 6.5 (ref 5.0–8.0)

## 2018-12-21 NOTE — ED Triage Notes (Signed)
Pt presents to [facility] for 3 days of nausea, fatigue, dizziness, Pt states she had her pravastatin increased to once a day instead of once every other day on Tuesday, unsure if this is causing her symptoms.  No focal neuro deficits noted in triage

## 2018-12-21 NOTE — Discharge Instructions (Signed)
No alarming signs on exam.  Your EKG was unchanged from prior.  Your urine was without infection.  No obvious reasons of why you are having the symptoms, this could also be side effect from your pravastatin.  Please continue to hold pravastatin. Keep hydrated, urine should be clear to pale yellow in color.  Follow-up with your PCP for further evaluation and management needed.  If experiencing worsening symptoms, chest pain, shortness of breath, palpitation, one-sided weakness, confusion, continue emergency department for further evaluation.

## 2018-12-21 NOTE — ED Provider Notes (Signed)
[place]    CSN: [id number] Arrival date & time: 12/21/18  1040     History   Chief Complaint Chief Complaint  Patient presents with  . Fatigue    HPI Jenny Madden is a 78 y.o. female.   78 year old female comes in for 3 day history of nausea, fatigue, "oozy" to the head. Jenny Madden states this is intermittent, but more present than not. Jenny Madden notices eating makes nausea worse, but able to tolerate fluids without difficulty. Jenny Madden denies any aggravating or alleviating factor to the "woozy" to the head.  Jenny Madden denies spinning sensation, lightheadedness.  Jenny Madden denies one-sided weakness, chest pain, shortness of breath, palpitations.  Denies fever, chills, night sweats.  Denies abdominal pain, vomiting.  Denies URI symptoms such as cough, congestion, sore throat.  Jenny Madden was recently increased on her pravastatin, and was not sure if this was causing her symptoms, and called her provider.  States was told to stop her pravastatin yesterday, and today.  Jenny Madden has some muscle cramping intermittently.  States Jenny Madden thought it may be of a "stomach bug", and took laxatives to cause diarrhea.  However, this has since resolved.  Jenny Madden denies urinary symptoms such as frequency, dysuria, hematuria.  Jenny Madden has a history of diabetes that is diet controlled, last A1c 6.2.  States has history of hypoglycemia, but has not checked her CBG while feeling "woozy" to the head.  Her morning CBG usually runs about 102, and her CBG prior to arrival was 138.     Past Medical History:  Diagnosis Date  . Allergic rhinitis, cause unspecified   . Anxiety state, unspecified    none recent  . Benign neoplasm of colon   . Cancer ([place])    renal mass  . Cataract   . Chronic kidney disease 01/2016   Tumor left side  . Diverticulosis of colon (without mention of hemorrhage)   . Dysphagia   . Family history of adverse reaction to anesthesia 3 yrs ago   slow to awaken   . Family history of malignant neoplasm of  gastrointestinal tract   . Heart murmur   . Hypertension   . Nonspecific (abnormal) findings on radiological and other examination of other intrathoracic organs   . Nonspecific abnormal electrocardiogram (ECG) (EKG)   . Osteoporosis   . Other dysphagia   . Pure hypercholesterolemia   . Type II or unspecified type diabetes mellitus without mention of complication, not stated as uncontrolled   . Unspecified essential hypertension   . Unspecified tinnitus years ago    Patient Active Problem List   Diagnosis Date Noted  . Right shoulder pain 12/16/2018  . Left renal mass 01/18/2016  . Schatzki's ring 10/19/2015  . Constipation 10/19/2015  . Routine general medical examination at a health care facility 07/13/2015  . Osteopenia 12/27/2011  . Vitamin D deficiency 12/27/2011  . Undiagnosed cardiac murmurs 05/12/2008  . Diabetes mellitus type 2, uncomplicated ([place]) 97/67/3419  . Hyperlipidemia associated with type 2 diabetes mellitus ([place]) 07/29/2007  . Essential hypertension 07/29/2007  . Allergic rhinitis 07/29/2007  . GERD 07/29/2007    Past Surgical History:  Procedure Laterality Date  . ABLATION Left 01/2016   Renal mass  . CESAREAN SECTION     x 1  . COLONOSCOPY    . colonscopy and endoscopy  5 yrs ago  . FOOT SURGERY     bilateral for hammer toes  . IR GENERIC HISTORICAL  12/06/2015   IR RADIOLOGIST EVAL &  MGMT 12/06/2015 Aletta Edouard, MD [facility] INTERV RAD  . IR GENERIC HISTORICAL  05/03/2016   IR RADIOLOGIST EVAL & MGMT 05/03/2016 Aletta Edouard, MD [facility] INTERV RAD  . IR GENERIC HISTORICAL  02/09/2016   IR RADIOLOGIST EVAL & MGMT 02/09/2016 [facility] INTERV RAD  . IR RADIOLOGIST EVAL & MGMT  01/09/2017  . IR RADIOLOGIST EVAL & MGMT  01/14/2018  . UPPER GASTROINTESTINAL ENDOSCOPY      OB History    Gravida  3   Para  1   Term  1   Preterm      AB  2   Living  1     SAB      TAB      Ectopic      Multiple      Live Births  1            Home  Medications    Prior to Admission medications   Medication Sig Start Date End Date Taking? Authorizing Provider  amLODipine (NORVASC) 10 MG tablet TAKE 1 TABLET DAILY 09/01/18  Yes Hoyt Koch, MD  aspirin 81 MG tablet Take 81 mg by mouth every morning.    Yes [provider]  Cholecalciferol (VITAMIN D) 2000 UNITS CAPS Take 2,000 Units by mouth daily.    Yes [provider]  famotidine (PEPCID) 40 MG tablet Take 1 tablet (40 mg total) by mouth daily. 11/26/18  Yes Hoyt Koch, MD  Polyethyl Glycol-Propyl Glycol (SYSTANE OP) Apply 1-2 drops to eye every morning.   Yes [provider]  pravastatin (PRAVACHOL) 40 MG tablet Take 1 tablet (40 mg total) by mouth daily. 09/10/18  Yes Hoyt Koch, MD  blood glucose meter kit and supplies Dispense based on patient and insurance preference. Use up to four times daily as directed. (E11.9). 05/16/18   Hoyt Koch, MD  Blood Glucose Monitoring Suppl (ONE TOUCH ULTRA 2) w/Device KIT USE TO CHECK BLOOD SUGAR   TWO TIMES A DAY 09/01/18   Hoyt Koch, MD  fexofenadine (ALLEGRA) 180 MG tablet Take 1 tablet (180 mg total) by mouth daily. Take 1 tablet by mouth once daily as needed for allergies 07/14/18   Hoyt Koch, MD  fluticasone [facility]) 50 MCG/ACT nasal spray Place 2 sprays into both nostrils daily. 01/30/17   Hoyt Koch, MD  glucose blood (ONE TOUCH ULTRA TEST) test strip Used to check blood sugars twice daily 05/26/18   Hoyt Koch, MD  omeprazole (PRILOSEC) 40 MG capsule Take 1 capsule (40 mg total) by mouth 2 (two) times daily before a meal. 07/02/18   Burns, Claudina Lick, MD  ONE TOUCH LANCETS MISC Used to check blood sugars twice daily 05/26/18   Hoyt Koch, MD  Alum & Mag Hydroxide-Simeth (MAGIC MOUTHWASH) SOLN 1 teaspoon gargle and swallow four times a day   12/27/11  [provider]  pantoprazole (PROTONIX) 40 MG tablet Take 1 tablet (40  mg total) by mouth daily. 07/01/18 07/01/18  Binnie Rail, MD    Family History Family History  Problem Relation Age of Onset  . Stroke Father   . Lung cancer Father   . Coronary artery disease Father   . Hypertension Mother   . Heart attack Mother   . Colon cancer Brother 33  . Esophageal cancer Neg Hx   . Rectal cancer Neg Hx   . Stomach cancer Neg Hx     Social History Social History  Tobacco Use  . Smoking status: Former Smoker    Types: Cigarettes    Last attempt to quit: 10/09/1999    Years since quitting: 19.2  . Smokeless tobacco: Never Used  Substance Use Topics  . Alcohol use: Yes    Alcohol/week: 1.0 standard drinks    Types: 1 Standard drinks or equivalent per week    Comment: social use  . Drug use: No     Allergies   Patient has no known allergies.   Review of Systems Review of Systems  Reason unable to perform ROS: See HPI as above.     Physical Exam Triage Vital Signs ED Triage Vitals  Enc Vitals Group     BP 12/21/18 1105 134/71     Pulse Rate 12/21/18 1105 91     Resp 12/21/18 1105 16     Temp 12/21/18 1105 97.6 F (36.4 C)     Temp Source 12/21/18 1105 Temporal     SpO2 12/21/18 1105 100 %     Weight --      Height --      Head Circumference --      Peak Flow --      Pain Score 12/21/18 1106 0     Pain Loc --      Pain Edu? --      Excl. in [place]? --    No data found.  Updated Vital Signs BP 134/71 (BP Location: Right Arm)   Pulse 91   Temp 97.6 F (36.4 C) (Temporal)   Resp 16   SpO2 100%   Physical Exam Constitutional:      General: Jenny Madden is not in acute distress.    Appearance: Jenny Madden is well-developed. Jenny Madden is not ill-appearing, toxic-appearing or diaphoretic.  HENT:     Head: Normocephalic and atraumatic.  Eyes:     Extraocular Movements: Extraocular movements intact.     Conjunctiva/sclera: Conjunctivae normal.     Pupils: Pupils are equal, round, and reactive to light.  Neck:     Musculoskeletal: Normal range of  motion and neck supple.  Cardiovascular:     Rate and Rhythm: Normal rate and regular rhythm.     Heart sounds: Murmur present. No friction rub. No gallop.   Pulmonary:     Effort: Pulmonary effort is normal.     Breath sounds: Normal breath sounds. No wheezing or rales.  Abdominal:     General: Bowel sounds are normal.     Palpations: Abdomen is soft.     Tenderness: There is no abdominal tenderness. There is no right CVA tenderness, left CVA tenderness, guarding or rebound.  Skin:    General: Skin is warm and dry.  Neurological:     Mental Status: Jenny Madden is alert and oriented to person, place, and time.     GCS: GCS eye subscore is 4. GCS verbal subscore is 5. GCS motor subscore is 6.     Cranial Nerves: Cranial nerves are intact.     Sensory: Sensation is intact.     Motor: Motor function is intact.     Coordination: Coordination is intact.     Gait: Gait is intact.  Psychiatric:        Behavior: Behavior normal.        Judgment: Judgment normal.      UC Treatments / Results  Labs (all labs ordered are listed, but only abnormal results are displayed) Labs Reviewed  POCT URINALYSIS DIP (DEVICE)    EKG None  Radiology No results found.  Procedures Procedures (including critical care time)  Medications Ordered in UC Medications - No data to display  Initial Impression / Assessment and Plan / UC Course  I have reviewed the triage vital signs and the nursing notes.  Pertinent labs & imaging results that were available during my care of the patient were reviewed by me and considered in my medical decision making (see chart for details).    No alarming signs on exam.  Patient neurology exam grossly intact.  Jenny Madden is able to speak in full sentences without difficulty.  Able to ambulate on own without hesitancy or difficulty.  Normal gait.  EKG normal sinus rhythm, 80 bpm, nonspecific T wave changes, unchanged from prior EKG.  EKG also examined by Dr. Meda Coffee.  Urine dipstick  negative for infection.  Patient with heart murmur on exam.  Jenny Madden has had a known heart murmur, states has been evaluated by cardiology in the past without problems.  Denies exertional fatigue, lightheadedness. Discussed with patient, no obvious indications as to etiology of her symptoms.  We will continue to hold pravastatin for possible side effect of medication.  Push fluids.  Patient to follow-up with PCP within the week for reevaluation if symptoms not improving.  Strict return precautions given.  Patient expresses understanding and agrees to plan.  Case discussed with Dr. Meda Coffee, who agrees to plan.  Final Clinical Impressions(s) / UC Diagnoses   Final diagnoses:  Nausea without vomiting  Fatigue, unspecified type  Dizziness   ED Prescriptions    None        Ok Edwards, PA-C 12/21/18 1500

## 2018-12-22 ENCOUNTER — Other Ambulatory Visit: Payer: Self-pay | Admitting: Internal Medicine

## 2018-12-22 DIAGNOSIS — R69 Illness, unspecified: Secondary | ICD-10-CM | POA: Diagnosis not present

## 2018-12-23 ENCOUNTER — Telehealth: Payer: Self-pay

## 2018-12-23 ENCOUNTER — Other Ambulatory Visit: Payer: Self-pay

## 2018-12-23 ENCOUNTER — Encounter: Payer: Self-pay | Admitting: Internal Medicine

## 2018-12-23 ENCOUNTER — Ambulatory Visit (INDEPENDENT_AMBULATORY_CARE_PROVIDER_SITE_OTHER): Payer: Medicare HMO | Admitting: Internal Medicine

## 2018-12-23 DIAGNOSIS — E785 Hyperlipidemia, unspecified: Secondary | ICD-10-CM | POA: Diagnosis not present

## 2018-12-23 DIAGNOSIS — K219 Gastro-esophageal reflux disease without esophagitis: Secondary | ICD-10-CM

## 2018-12-23 DIAGNOSIS — E1169 Type 2 diabetes mellitus with other specified complication: Secondary | ICD-10-CM | POA: Diagnosis not present

## 2018-12-23 MED ORDER — SIMVASTATIN 40 MG PO TABS: 40.0000 mg | ORAL_TABLET | Freq: Every day | ORAL | 3 refills | Status: DC

## 2018-12-23 NOTE — Telephone Encounter (Signed)
Copied from [place] [id number]. Topic: General - Other >> Dec 23, 2018  1:46 PM Carolyn Stare wrote:  Pharmacy cal lto say that simvastatin (ZOCOR) 40 MG tablet   and amlodipine are interacting and they need a call back

## 2018-12-23 NOTE — Progress Notes (Signed)
   Subjective:   Patient ID: Jenny Madden, female    DOB: 08-Jun-1941, 78 y.o.   MRN: [id number]  HPI The patient is a 78 YO female coming in for follow up of GI problems. She stopped taking pravastatin after that visit and her stomach is improving. She still has mild GERD. She was having nausea and pain. She never had this trouble with simvastatin. She wants to go back to taking that for her cholesterol. Denies fevers or chills. Denies cough or SOB. Denies chest pains.   Review of Systems  Constitutional: Negative.   HENT: Negative.   Eyes: Negative.   Respiratory: Negative for cough, chest tightness and shortness of breath.   Cardiovascular: Negative for chest pain, palpitations and leg swelling.  Gastrointestinal: Negative for abdominal distention, abdominal pain, constipation, diarrhea, nausea and vomiting.  Musculoskeletal: Negative.   Skin: Negative.   Neurological: Negative.   Psychiatric/Behavioral: Negative.     Objective:  Physical Exam Constitutional:      Appearance: She is well-developed.  HENT:     Head: Normocephalic and atraumatic.  Neck:     Musculoskeletal: Normal range of motion.  Cardiovascular:     Rate and Rhythm: Normal rate and regular rhythm.  Pulmonary:     Effort: Pulmonary effort is normal. No respiratory distress.     Breath sounds: Normal breath sounds. No wheezing or rales.  Abdominal:     General: Bowel sounds are normal. There is no distension.     Palpations: Abdomen is soft.     Tenderness: There is no abdominal tenderness. There is no rebound.  Skin:    General: Skin is warm and dry.  Neurological:     Mental Status: She is alert and oriented to person, place, and time.     Coordination: Coordination normal.     Vitals:   12/23/18 1304  BP: 140/68  Pulse: 93  Temp: 98.1 F (36.7 C)  TempSrc: Oral  SpO2: 99%  Weight: 159 lb (72.1 kg)  Height: 5\' 3"  (1.6 m)    Assessment & Plan:

## 2018-12-23 NOTE — Assessment & Plan Note (Signed)
Taking pepcid with good results overall. Talked about breakthrough options.

## 2018-12-23 NOTE — Assessment & Plan Note (Signed)
Will change pravastatin 40 mg daily to simvastatin 40 mg daily. Recheck lipid panel in 3 months.

## 2018-12-23 NOTE — Telephone Encounter (Signed)
Just getting the okay before I call the pharmacy

## 2018-12-23 NOTE — Telephone Encounter (Signed)
Fine to do 

## 2018-12-23 NOTE — Telephone Encounter (Signed)
Pharmacy informed.

## 2018-12-23 NOTE — Patient Instructions (Addendum)
We have put in for the labs and you can get this done in about 3 months.  We have sent in the simvastatin to take again.

## 2018-12-24 ENCOUNTER — Encounter: Payer: Self-pay | Admitting: Internal Medicine

## 2018-12-24 ENCOUNTER — Ambulatory Visit: Payer: Self-pay | Admitting: *Deleted

## 2018-12-24 ENCOUNTER — Ambulatory Visit (HOSPITAL_COMMUNITY)
Admission: EM | Admit: 2018-12-24 | Discharge: 2018-12-24 | Disposition: A | Discharge status: Home / Self Care | Payer: Medicare HMO | Source: Home / Self Care

## 2018-12-24 ENCOUNTER — Other Ambulatory Visit: Payer: Self-pay

## 2018-12-24 ENCOUNTER — Emergency Department (HOSPITAL_COMMUNITY)
Admission: EM | Admit: 2018-12-24 | Discharge: 2018-12-24 | Disposition: A | Discharge status: Home / Self Care | Payer: Medicare HMO | Attending: Emergency Medicine | Admitting: Emergency Medicine

## 2018-12-24 ENCOUNTER — Encounter (HOSPITAL_COMMUNITY): Payer: Self-pay | Admitting: Emergency Medicine

## 2018-12-24 DIAGNOSIS — N189 Chronic kidney disease, unspecified: Secondary | ICD-10-CM | POA: Insufficient documentation

## 2018-12-24 DIAGNOSIS — Z87891 Personal history of nicotine dependence: Secondary | ICD-10-CM | POA: Insufficient documentation

## 2018-12-24 DIAGNOSIS — I129 Hypertensive chronic kidney disease with stage 1 through stage 4 chronic kidney disease, or unspecified chronic kidney disease: Secondary | ICD-10-CM | POA: Insufficient documentation

## 2018-12-24 DIAGNOSIS — Z85528 Personal history of other malignant neoplasm of kidney: Secondary | ICD-10-CM | POA: Diagnosis not present

## 2018-12-24 DIAGNOSIS — M79601 Pain in right arm: Secondary | ICD-10-CM | POA: Insufficient documentation

## 2018-12-24 DIAGNOSIS — Z79899 Other long term (current) drug therapy: Secondary | ICD-10-CM | POA: Insufficient documentation

## 2018-12-24 DIAGNOSIS — E119 Type 2 diabetes mellitus without complications: Secondary | ICD-10-CM | POA: Insufficient documentation

## 2018-12-24 MED ORDER — ACETAMINOPHEN 325 MG PO TABS: 650.0000 mg | ORAL_TABLET | Freq: Four times a day (QID) | ORAL | 0 refills | Status: AC | PRN

## 2018-12-24 NOTE — ED Provider Notes (Signed)
[facility] EMERGENCY DEPARTMENT Provider Note   CSN: [id number] Arrival date & time: 12/24/18  1731    History   Chief Complaint Chief Complaint  Patient presents with  . Arm Pain    HPI Jenny Madden is a 78 y.o. female with history of CKD, hypertension, heart murmur, type II but diabetes mellitus, renal mass presents for evaluation of intermittent right upper extremity pain for 1 month and pulsatile right-sided neck abnormality noticed today.  She reports that approximately 1 month ago she developed right forearm and right shoulder pain after lifting a garbage can.  She went to her PCP who gave her a shot of steroids and recommended some home exercises which she has not done.  She states that every morning since then when she awakens she will have a dull pain "like a toothache "to the proximal right forearm.  She states that the pain will wake her out of her sleep and will resolve as she begins to move and go about her day.  She denies any associated numbness or weakness.  She has not been taking anything for this pain.  She denies any forearm pain at this time.Was seen by her PCP yesterday but did not mention her symptoms.    Earlier this morning she noticed a pulsation to the right side of the neck and some neck tightness. Denies any headache, vision changes, numbness, lightheadedness, dysphagia, or syncope.  Denies any neck pain, fevers, chest pain, shortness of breath, abdominal pain, nausea, or vomiting.  Has not tried anything for her symptoms.      The history is provided by the patient.    Past Medical History:  Diagnosis Date  . Allergic rhinitis, cause unspecified   . Anxiety state, unspecified    none recent  . Benign neoplasm of colon   . Cancer ([place])    renal mass  . Cataract   . Chronic kidney disease 01/2016   Tumor left side  . Diverticulosis of colon (without mention of hemorrhage)   . Dysphagia   . Family history of adverse reaction  to anesthesia 3 yrs ago   slow to awaken   . Family history of malignant neoplasm of gastrointestinal tract   . Heart murmur   . Hypertension   . Nonspecific (abnormal) findings on radiological and other examination of other intrathoracic organs   . Nonspecific abnormal electrocardiogram (ECG) (EKG)   . Osteoporosis   . Other dysphagia   . Pure hypercholesterolemia   . Type II or unspecified type diabetes mellitus without mention of complication, not stated as uncontrolled   . Unspecified essential hypertension   . Unspecified tinnitus years ago    Patient Active Problem List   Diagnosis Date Noted  . Right shoulder pain 12/16/2018  . Left renal mass 01/18/2016  . Schatzki's ring 10/19/2015  . Constipation 10/19/2015  . Routine general medical examination at a health care facility 07/13/2015  . Osteopenia 12/27/2011  . Vitamin D deficiency 12/27/2011  . Undiagnosed cardiac murmurs 05/12/2008  . Diabetes mellitus type 2, uncomplicated ([place]) 78/67/5449  . Hyperlipidemia associated with type 2 diabetes mellitus ([place]) 07/29/2007  . Essential hypertension 07/29/2007  . Allergic rhinitis 07/29/2007  . GERD 07/29/2007    Past Surgical History:  Procedure Laterality Date  . ABLATION Left 01/2016   Renal mass  . CESAREAN SECTION     x 1  . COLONOSCOPY    . colonscopy and endoscopy  5 yrs ago  . FOOT  SURGERY     bilateral for hammer toes  . IR GENERIC HISTORICAL  12/06/2015   IR RADIOLOGIST EVAL & MGMT 12/06/2015 Aletta Edouard, MD [facility] INTERV RAD  . IR GENERIC HISTORICAL  05/03/2016   IR RADIOLOGIST EVAL & MGMT 05/03/2016 Aletta Edouard, MD [facility] INTERV RAD  . IR GENERIC HISTORICAL  02/09/2016   IR RADIOLOGIST EVAL & MGMT 02/09/2016 [facility] INTERV RAD  . IR RADIOLOGIST EVAL & MGMT  01/09/2017  . IR RADIOLOGIST EVAL & MGMT  01/14/2018  . UPPER GASTROINTESTINAL ENDOSCOPY       OB History    Gravida  3   Para  1   Term  1   Preterm      AB  2   Living  1     SAB       TAB      Ectopic      Multiple      Live Births  1            Home Medications    Prior to Admission medications   Medication Sig Start Date End Date Taking? Authorizing Provider  amLODipine (NORVASC) 10 MG tablet TAKE 1 TABLET DAILY Patient taking differently: Take 10 mg by mouth daily.  09/01/18  Yes Hoyt Koch, MD  aspirin 81 MG tablet Take 81 mg by mouth every morning.    Yes [provider]  Cholecalciferol (VITAMIN D) 2000 UNITS CAPS Take 2,000 Units by mouth daily.    Yes [provider]  famotidine (PEPCID) 40 MG tablet Take 1 tablet (40 mg total) by mouth daily. 11/26/18  Yes Hoyt Koch, MD  fluticasone [facility]) 50 MCG/ACT nasal spray Place 2 sprays into both nostrils daily. 01/30/17  Yes Hoyt Koch, MD  omeprazole (PRILOSEC) 40 MG capsule Take 1 capsule (40 mg total) by mouth 2 (two) times daily before a meal. 07/02/18  Yes Burns, Claudina Lick, MD  simvastatin (ZOCOR) 40 MG tablet Take 1 tablet (40 mg total) by mouth at bedtime. 12/23/18  Yes Hoyt Koch, MD  acetaminophen (TYLENOL) 325 MG tablet Take 2 tablets (650 mg total) by mouth every 6 (six) hours as needed for moderate pain. 12/24/18   Nils Flack, Emmery Seiler A, PA-C  blood glucose meter kit and supplies Dispense based on patient and insurance preference. Use up to four times daily as directed. (E11.9). 05/16/18   Hoyt Koch, MD  Blood Glucose Monitoring Suppl (ONE TOUCH ULTRA 2) w/Device KIT USE TO CHECK BLOOD SUGAR   TWO TIMES A DAY 09/01/18   Hoyt Koch, MD  fexofenadine (ALLEGRA) 180 MG tablet Take 1 tablet (180 mg total) by mouth daily. Take 1 tablet by mouth once daily as needed for allergies Patient not taking: Reported on 12/24/2018 07/14/18   Hoyt Koch, MD  glucose blood test strip CHECK BLOOD SUGAR TWO TIMESA DAY 12/22/18   Hoyt Koch, MD  ONE [facility] LANCETS MISC Used to check blood sugars twice daily 05/26/18   Hoyt Koch, MD  pravastatin (PRAVACHOL) 40 MG tablet Take 1 tablet (40 mg total) by mouth daily. Patient not taking: Reported on 12/24/2018 09/10/18   Hoyt Koch, MD  Alum & Mag Hydroxide-Simeth (MAGIC MOUTHWASH) SOLN 1 teaspoon gargle and swallow four times a day   12/27/11  [provider]  pantoprazole (PROTONIX) 40 MG tablet Take 1 tablet (40 mg total) by mouth daily. 07/01/18 07/01/18  Binnie Rail, MD    Family  History Family History  Problem Relation Age of Onset  . Stroke Father   . Lung cancer Father   . Coronary artery disease Father   . Hypertension Mother   . Heart attack Mother   . Colon cancer Brother 67  . Esophageal cancer Neg Hx   . Rectal cancer Neg Hx   . Stomach cancer Neg Hx     Social History Social History   Tobacco Use  . Smoking status: Former Smoker    Types: Cigarettes    Last attempt to quit: 10/09/1999    Years since quitting: 19.2  . Smokeless tobacco: Never Used  Substance Use Topics  . Alcohol use: Yes    Alcohol/week: 1.0 standard drinks    Types: 1 Standard drinks or equivalent per week    Comment: social use  . Drug use: No     Allergies   Patient has no known allergies.   Review of Systems Review of Systems  Constitutional: Negative for chills and fever.  Respiratory: Negative for shortness of breath.   Cardiovascular: Negative for chest pain.  Gastrointestinal: Negative for abdominal pain, nausea and vomiting.  Musculoskeletal: Positive for arthralgias. Negative for neck pain and neck stiffness.  Neurological: Negative for syncope, weakness, numbness and headaches.  All other systems reviewed and are negative.    Physical Exam Updated Vital Signs BP (!) 142/75 (BP Location: Right Arm)   Pulse 72   Temp 98.7 F (37.1 C) (Oral)   Resp 16   Ht '5\' 3"'  (1.6 m)   Wt 71.2 kg   SpO2 98%   BMI 27.81 kg/m   Physical Exam Vitals signs and nursing note reviewed.  Constitutional:      General: She is not in  acute distress.    Appearance: She is well-developed.  HENT:     Head: Normocephalic and atraumatic.  Eyes:     General:        Right eye: No discharge.        Left eye: No discharge.     Conjunctiva/sclera: Conjunctivae normal.     Pupils: Pupils are equal, round, and reactive to light.  Neck:     Musculoskeletal: Normal range of motion and neck supple.     Trachea: No tracheal deviation.     Comments: Normal pulsation of right jugular vein noted. No tenderness. No hepatojugular reflux. No midline spine TTP, no paraspinal muscle tenderness, no deformity, crepitus, or step-off noted. Normal active range of motion with no pain noted.  Cardiovascular:     Rate and Rhythm: Normal rate and regular rhythm.     Pulses: Normal pulses.  Pulmonary:     Effort: Pulmonary effort is normal.     Breath sounds: Normal breath sounds.  Abdominal:     General: Abdomen is flat. There is no distension.     Tenderness: There is no abdominal tenderness. There is no guarding or rebound.  Musculoskeletal:     Comments: 5/5 strength of BUE and BLE major muscle groups.  No tenderness to palpation of the right elbow or forearm.  She has some tenderness to palpation of the anterior aspect of the right elbow.  Positive empty can sign, positive Hawkins/Neer's impingement tests. No erythema, warmth, or crepitus noted.   Skin:    General: Skin is warm and dry.     Findings: No erythema.  Neurological:     General: No focal deficit present.     Mental Status: She is alert.  Comments: Mental Status:  Alert, thought content appropriate, able to give a coherent history. Speech fluent without evidence of aphasia. Able to follow 2 step commands without difficulty.  Cranial Nerves:  II:  Peripheral visual fields grossly normal, pupils equal, round, reactive to light III,IV, VI: ptosis not present, extra-ocular motions intact bilaterally  V,VII: smile symmetric, facial light touch sensation equal VIII: hearing  grossly normal to voice  X: uvula elevates symmetrically  XI: bilateral shoulder shrug symmetric and strong XII: midline tongue extension without fassiculations Motor:  Normal tone. 5/5 strength of BUE and BLE major muscle groups including strong and equal grip strength and dorsiflexion/plantar flexion, no pronator drift. Sensory: light touch normal in all extremities. Cerebellar: Romberg sign absent Gait: normal gait and balance. Able to walk on toes and heels with ease.     Psychiatric:        Behavior: Behavior normal.      ED Treatments / Results  Labs (all labs ordered are listed, but only abnormal results are displayed) Labs Reviewed - No data to display  EKG EKG Interpretation  Date/Time:  Wednesday December 24 2018 17:36:39 EDT Ventricular Rate:  86 PR Interval:  140 QRS Duration: 80 QT Interval:  384 QTC Calculation: 459 R Axis:   49 Text Interpretation:  Normal sinus rhythm Septal infarct , age undetermined Abnormal ECG No acute changes No significant change since last tracing Confirmed by Varney Biles [phone]) on 12/24/2018 5:40:42 PM   Radiology No results found.  Procedures Procedures (including critical care time)  Medications Ordered in ED Medications - No data to display   Initial Impression / Assessment and Plan / ED Course  I have reviewed the triage vital signs and the nursing notes.  Pertinent labs & imaging results that were available during my care of the patient were reviewed by me and considered in my medical decision making (see chart for details).        Patient presenting for evaluation of intermittent arthralgias of the right forearm and shoulder.  Also noticed pulsation of her right external jugular vein today.  She is afebrile, vital signs are stable.  She is nontoxic in appearance.  Normal neurologic examination with no focal neurologic deficits.  With regards to the pulsation of her external jugular vein this does appear to be within  normal limits and does change based on her positioning.  I doubt any serious vascular pathology as she has no pain, headache, or neurological symptoms.  Doubt dissection, carotid body tumor, or deep space neck infection.  EKG shows no significant changes from last tracing.  Doubt ACS/MI in the absence of any chest pain and with reassuring EKG.  She is neurovascularly intact with no evidence of septic arthritis or unilateral weakness.  No tenderness to palpation of the right elbow or forearm.  Some components of her physical examination to suggest rotator cuff injury.  We discussed conservative therapy and management with Tylenol, ice, stretching, and follow-up with PCP or orthopedist for reevaluation.  Discussed strict ED return precautions.  Patient and daughter verbalized understanding of and agreement with plan and patient stable for discharge home at this time.  Patient was seen and evaluated by Dr. Rex Kras who agrees with assessment and plan at this time.  Final Clinical Impressions(s) / ED Diagnoses   Final diagnoses:  Right arm pain    ED Discharge Orders         Ordered    acetaminophen (TYLENOL) 325 MG tablet  Every 6 hours  PRN     12/24/18 1921           Renita Papa, PA-C 12/24/18 1933    Little, Wenda Overland, MD 12/25/18 [phone]

## 2018-12-24 NOTE — Discharge Instructions (Addendum)
Take 1 to 2 tablets of Tylenol every 6 hours as needed for pain.  Do not exceed 4000 mg of Tylenol daily.  Apply ice or heat, whichever feels best, 20 minutes at a time 3-4 times daily to areas of pain and soreness and do some gentle stretching.  Follow-up with orthopedics or your primary care physician for reevaluation of your shoulder and arm pain.  Return to the emergency department immediately if any concerning signs or symptoms develop such as fevers, difficulty swallowing, headaches, lightheadedness, vision changes, numbness, weakness, passing out, or chest pains.

## 2018-12-24 NOTE — Telephone Encounter (Signed)
Pt called with having right arm/shoulder pain. Stated ths lifted a garbage can a month ago and her arm has been hurting every since. But only hurts first thing in the morning. She has not taking anything for the pain. She states it goes away. Today she noticed a pulsating in her right neck which also is painful to her. She denies shortness of breath, chest pain or tightness, or shortness of breath. She denies any swelling in her neck or arm. Her b/p this morning was 144/68 hr 72 and now it is 155/79 hr 91.  She has decided that she will go to urgent care to be assessed.  She saw her provider in the office yesterday and forgot to tell her that the pain was still there. Advised of cardiac symptoms,where she would need to call 911. Pt voiced understanding. Routing to flow at [facility] for review and recommendation.  Reason for Disposition . Arm pain is a chronic symptom (recurrent or ongoing AND present > 4 weeks)  Answer Assessment - Initial Assessment Questions 1. ONSET: "When did the pain start?"     A month 2. LOCATION: "Where is the pain located?"     Right shoulder 3. PAIN: "How bad is the pain?" (Scale 1-10; or mild, moderate, severe)   - MILD (1-3): doesn't interfere with normal activities   - MODERATE (4-7): interferes with normal activities (e.g., work or school) or awakens from sleep   - SEVERE (8-10): excruciating pain, unable to do any normal activities, unable to hold a cup of water Only happens in the morning and pain #9n feels like a tooth ache 4. WORK OR EXERCISE: "Has there been any recent work or exercise that involved this part of the body?"     Lifting garbage can with right arm about a month 5. CAUSE: "What do you think is causing the arm pain?"     No sure 6. OTHER SYMPTOMS: "Do you have any other symptoms?" (e.g., neck pain, swelling, rash, fever, numbness, weakness)     Can see pulsation in neck on the right, woozy at times 7. PREGNANCY: "Is there any  chance you are pregnant?" "When was your last menstrual period?" n/A  Protocols used: ARM PAIN-A-AH

## 2018-12-24 NOTE — ED Triage Notes (Signed)
Reports feeling pulse in R side of neck and R arm aching only in the mornings for the last month.

## 2018-12-24 NOTE — ED Notes (Signed)
Patient verbalizes understanding of discharge instructions. Opportunity for questioning and answers were provided. Armband removed by staff, pt discharged from ED.  

## 2018-12-24 NOTE — ED Notes (Signed)
complains of right arm pain, complains of right neck tightness, dizziness.  Patient's complaints discussed with dr Joseph Art.  Patient agreed to ed evaluation.

## 2018-12-25 NOTE — Addendum Note (Signed)
Addended by: Nash Mantis F on: 12/25/2018 08:10 AM   Modules accepted: Miquel Dunn

## 2018-12-25 NOTE — Telephone Encounter (Signed)
Patient went to ED

## 2018-12-30 ENCOUNTER — Encounter: Payer: Self-pay | Admitting: Internal Medicine

## 2019-01-21 ENCOUNTER — Inpatient Hospital Stay (HOSPITAL_COMMUNITY): Admission: RE | Admit: 2019-01-21 | Payer: Medicare HMO | Source: Ambulatory Visit

## 2019-01-22 MED ORDER — SIMVASTATIN 40 MG PO TABS: 40.0000 mg | ORAL_TABLET | Freq: Every day | ORAL | 1 refills | Status: DC

## 2019-01-27 ENCOUNTER — Other Ambulatory Visit: Payer: Medicare HMO

## 2019-02-04 DIAGNOSIS — R69 Illness, unspecified: Secondary | ICD-10-CM | POA: Diagnosis not present

## 2019-02-05 ENCOUNTER — Ambulatory Visit (INDEPENDENT_AMBULATORY_CARE_PROVIDER_SITE_OTHER): Payer: Medicare HMO | Admitting: *Deleted

## 2019-02-05 DIAGNOSIS — Z0000 Encounter for general adult medical examination without abnormal findings: Secondary | ICD-10-CM | POA: Diagnosis not present

## 2019-02-05 NOTE — Progress Notes (Signed)
Medical screening examination/treatment/procedure(s) were performed by non-physician practitioner and as supervising physician I was immediately available for consultation/collaboration. I agree with above. Trevyon Swor A Neko Boyajian, MD 

## 2019-02-05 NOTE — Patient Instructions (Addendum)
If you or someone you know has experienced the death of a loved one, the support of others can play an invaluable role in the healing process. [facility] offers grief and loss services for anyone in the community. Contact us today [phone]  If you cannot attend class in person, you can still exercise at home. Video taped versions of [employer] classes are shown on [employer] (GTN) at 8 am and 1 pm Mondays through Fridays. You can also purchase a copy of the [employer]Y DVD by calling [place] (GTN) [employer]. GTN is available on [employer] channel 13 with a digital cable box and on [employer] channel 31. GTN is also available on [employer], channel 99. To view GTN, go to channel 99, press OK, select [place], then select GTN to start the channel.   Bring a copy of your living will and/or healthcare power of attorney to your next office visit.  Continue doing brain stimulating activities (puzzles, reading, adult coloring books, staying active) to keep memory sharp.   Continue to eat heart healthy diet (full of fruits, vegetables, whole grains, lean protein, water--limit salt, fat, and sugar intake) and increase physical activity as tolerated.  Jenny Madden , Thank you for taking time to come for your Medicare Wellness Visit. I appreciate your ongoing commitment to your health goals. Please review the following plan we discussed and let me know if I can assist you in the future.   These are the goals we discussed: Goals    . Patient Stated     Continue to stay as healthy and as independent as possible by continuing to be physically and socially active.        This is a list of the screening recommended for you and due dates:  Health Maintenance  Topic Date Due  . Eye exam for diabetics  09/24/2018  . Hemoglobin A1C  03/12/2019  . Flu Shot  05/09/2019  . Complete foot exam   07/12/2019  . Urine Protein Check   09/11/2019  . Tetanus Vaccine  12/29/2019  . Colon Cancer Screening  11/07/2021  . DEXA scan (bone density measurement)  Completed  . Pneumonia vaccines  Completed    Preventive Care 79 Years and Older, Female Preventive care refers to lifestyle choices and visits with your health care provider that can promote health and wellness. What does preventive care include?  A yearly physical exam. This is also called an annual well check.  Dental exams once or twice a year.  Routine eye exams. Ask your health care provider how often you should have your eyes checked.  Personal lifestyle choices, including: ? Daily care of your teeth and gums. ? Regular physical activity. ? Eating a healthy diet. ? Avoiding tobacco and drug use. ? Limiting alcohol use. ? Practicing safe sex. ? Taking low-dose aspirin every day. ? Taking vitamin and mineral supplements as recommended by your health care provider. What happens during an annual well check? The services and screenings done by your health care provider during your annual well check will depend on your age, overall health, lifestyle risk factors, and family history of disease. Counseling Your health care provider may ask you questions about your:  Alcohol use.  Tobacco use.  Drug use.  Emotional well-being.  Home and relationship well-being.  Sexual activity.  Eating habits.  History of falls.  Memory and ability to understand (cognition).  Work and work Statistician.  Reproductive health.  Screening  You may have the following tests or measurements:  Height, weight, and BMI.  Blood pressure.  Lipid and cholesterol levels. These may be checked every 5 years, or more frequently if you are over 20 years old.  Skin check.  Lung cancer screening. You may have this screening every year starting at age 79 if you have a 30-pack-year history of smoking and currently smoke or have quit within the past 15 years.  Colorectal  cancer screening. All adults should have this screening starting at age 79 and continuing until age 79. You will have tests every 1-10 years, depending on your results and the type of screening test. People at increased risk should start screening at an earlier age. Screening tests may include: ? Guaiac-based fecal occult blood testing. ? Fecal immunochemical test (FIT). ? Stool DNA test. ? Virtual colonoscopy. ? Sigmoidoscopy. During this test, a flexible tube with a tiny camera (sigmoidoscope) is used to examine your rectum and lower colon. The sigmoidoscope is inserted through your anus into your rectum and lower colon. ? Colonoscopy. During this test, a long, thin, flexible tube with a tiny camera (colonoscope) is used to examine your entire colon and rectum.  Hepatitis C blood test.  Hepatitis B blood test.  Sexually transmitted disease (STD) testing.  Diabetes screening. This is done by checking your blood sugar (glucose) after you have not eaten for a while (fasting). You may have this done every 1-3 years.  Bone density scan. This is done to screen for osteoporosis. You may have this done starting at age 79.  Mammogram. This may be done every 1-2 years. Talk to your health care provider about how often you should have regular mammograms. Talk with your health care provider about your test results, treatment options, and if necessary, the need for more tests. Vaccines Your health care provider may recommend certain vaccines, such as:  Influenza vaccine. This is recommended every year.  Tetanus, diphtheria, and acellular pertussis (Tdap, Td) vaccine. You may need a Td booster every 10 years.  Varicella vaccine. You may need this if you have not been vaccinated.  Zoster vaccine. You may need this after age 79.  Measles, mumps, and rubella (MMR) vaccine. You may need at least one dose of MMR if you were born in 1957 or later. You may also need a second dose.  Pneumococcal  13-valent conjugate (PCV13) vaccine. One dose is recommended after age 79.  Pneumococcal polysaccharide (PPSV23) vaccine. One dose is recommended after age 79.  Meningococcal vaccine. You may need this if you have certain conditions.  Hepatitis A vaccine. You may need this if you have certain conditions or if you travel or work in places where you may be exposed to hepatitis A.  Hepatitis B vaccine. You may need this if you have certain conditions or if you travel or work in places where you may be exposed to hepatitis B.  Haemophilus influenzae type b (Hib) vaccine. You may need this if you have certain conditions. Talk to your health care provider about which screenings and vaccines you need and how often you need them. This information is not intended to replace advice given to you by your health care provider. Make sure you discuss any questions you have with your health care provider. Document Released: 10/21/2015 Document Revised: 11/14/2017 Document Reviewed: 07/26/2015 Elsevier Interactive Patient Education  2019 [employer].

## 2019-02-05 NOTE — Progress Notes (Signed)
Subjective:   Jenny Madden is a 78 y.o. female who presents for Medicare Annual (Subsequent) preventive examination.  I connected with patient 02/05/19 at 10:00 AM EDT by a video enabled telemedicine application and verified that I am speaking with the correct person using two identifiers. Patient stated full name and DOB. Patient gave permission to continue with virtual visit. Patient's location was at home and Nurse's location was at [place] office.   Review of Systems:  No ROS.  Medicare Wellness Virtual Visit. UTA vital signs.  Additional risk factors are reflected in the social history.  Cardiac Risk Factors include: advanced age (>70mn, >>65women);diabetes mellitus;dyslipidemia;hypertension Sleep patterns: feels rested on waking, gets up 2 times nightly to void and sleeps 7-8 hours nightly.    Home Safety/Smoke Alarms: Feels safe in home. Smoke alarms in place.  Living environment; residence and Firearm Safety: 1-story house/ trailer. Lives alone, no needs for DME, good support system Seat Belt Safety/Bike Helmet: Wears seat belt.      Objective:     Vitals: There were no vitals taken for this visit.  There is no height or weight on file to calculate BMI.  Advanced Directives 02/05/2019 12/24/2018 04/24/2018 01/10/2018 10/31/2016 05/04/2016 01/18/2016  Does Patient Have a Medical Advance Directive? Yes No No No No Yes No  Type of AParamedicof [place] will - - - - Living will -  Copy of [place]? No - copy requested - - - - - -  Would patient like information on creating a medical advance directive? - No - Patient declined - - - - No - patient declined information    Tobacco Social History   Tobacco Use  Smoking Status Former Smoker  . Types: Cigarettes  . Last attempt to quit: 10/09/1999  . Years since quitting: 19.3  Smokeless Tobacco Never Used     Counseling given: Not Answered  Past Medical History:   Diagnosis Date  . Allergic rhinitis, cause unspecified   . Anxiety state, unspecified    none recent  . Benign neoplasm of colon   . [place]    renal mass  . Cataract   . Chronic kidney disease 01/2016   Tumor left side  . Diverticulosis of colon (without mention of hemorrhage)   . Dysphagia   . Family history of adverse reaction to anesthesia 3 yrs ago   slow to awaken   . Family history of malignant neoplasm of gastrointestinal tract   . Heart murmur   . Hypertension   . Nonspecific (abnormal) findings on radiological and other examination of other intrathoracic organs   . Nonspecific abnormal electrocardiogram (ECG) (EKG)   . Osteoporosis   . Other dysphagia   . Pure hypercholesterolemia   . Type II or unspecified type diabetes mellitus without mention of complication, not stated as uncontrolled   . Unspecified essential hypertension   . Unspecified tinnitus years ago   Past Surgical History:  Procedure Laterality Date  . ABLATION Left 01/2016   Renal mass  . CESAREAN SECTION     x 1  . COLONOSCOPY    . colonscopy and endoscopy  5 yrs ago  . FOOT SURGERY     bilateral for hammer toes  . IR GENERIC HISTORICAL  12/06/2015   IR RADIOLOGIST EVAL & MGMT 12/06/2015 GAletta Edouard MD [facility] INTERV RAD  . IR GENERIC HISTORICAL  05/03/2016   IR RADIOLOGIST EVAL & MGMT 05/03/2016 GAletta Edouard MD  [facility] INTERV RAD  . IR GENERIC HISTORICAL  02/09/2016   IR RADIOLOGIST EVAL & MGMT 02/09/2016 [facility] INTERV RAD  . IR RADIOLOGIST EVAL & MGMT  01/09/2017  . IR RADIOLOGIST EVAL & MGMT  01/14/2018  . UPPER GASTROINTESTINAL ENDOSCOPY     Family History  Problem Relation Age of Onset  . Stroke Father   . Lung cancer Father   . Coronary artery disease Father   . Hypertension Mother   . Heart attack Mother   . Colon cancer Brother 73  . Esophageal cancer Neg Hx   . Rectal cancer Neg Hx   . Stomach cancer Neg Hx    Social History   Socioeconomic History  . Marital status:  Widowed    Spouse name: bob x 37 yrs  . Number of children: 3  . Years of education: Not on file  . Highest education level: Not on file  Occupational History  . Occupation: Airline pilot: RETIRED  Social Needs  . Financial resource strain: Not hard at all  . Food insecurity:    Worry: Never true    Inability: Never true  . Transportation needs:    Medical: No    Non-medical: No  Tobacco Use  . Smoking status: Former Smoker    Types: Cigarettes    Last attempt to quit: 10/09/1999    Years since quitting: 19.3  . Smokeless tobacco: Never Used  Substance and Sexual Activity  . Alcohol use: Yes    Alcohol/week: 1.0 standard drinks    Types: 1 Standard drinks or equivalent per week    Comment: social use  . Drug use: No  . Sexual activity: Never    Comment: 1st intercourse- 21, partners- 3, widow   Lifestyle  . Physical activity:    Days per week: 5 days    Minutes per session: 50 min  . Stress: Only a little  Relationships  . Social connections:    Talks on phone: More than three times a week    Gets together: More than three times a week    Attends religious service: More than 4 times per year    Active member of club or organization: Yes    Attends meetings of clubs or organizations: More than 4 times per year    Relationship status: Widowed  Other Topics Concern  . Not on file  Social History Narrative   Married to husband, [name] x12yr   3 children - 1 son w/ Hodgkin's, 2 daughters alive and well   High school education    Outpatient Encounter Medications as of 02/05/2019  Medication Sig  . acetaminophen (TYLENOL) 325 MG tablet Take 2 tablets (650 mg total) by mouth every 6 (six) hours as needed for moderate pain.  .Marland KitchenamLODipine (NORVASC) 10 MG tablet TAKE 1 TABLET DAILY (Patient taking differently: Take 10 mg by mouth daily. )  . aspirin 81 MG tablet Take 81 mg by mouth every morning.   . blood glucose meter kit and supplies Dispense based on patient and  insurance preference. Use up to four times daily as directed. (E11.9).  .Marland KitchenBlood Glucose Monitoring Suppl (ONE TOUCH ULTRA 2) w/Device KIT USE TO CHECK BLOOD SUGAR   TWO TIMES A DAY  . Cholecalciferol (VITAMIN D) 2000 UNITS CAPS Take 2,000 Units by mouth daily.   . famotidine (PEPCID) 40 MG tablet Take 1 tablet (40 mg total) by mouth daily.  . fexofenadine (ALLEGRA) 180 MG tablet Take 1 tablet (  180 mg total) by mouth daily. Take 1 tablet by mouth once daily as needed for allergies  . fluticasone (FLONASE) 50 MCG/ACT nasal spray Place 2 sprays into both nostrils daily.  Marland Kitchen glucose blood test strip CHECK BLOOD SUGAR TWO TIMESA DAY  . omeprazole (PRILOSEC) 40 MG capsule Take 1 capsule (40 mg total) by mouth 2 (two) times daily before a meal.  . ONE TOUCH LANCETS MISC Used to check blood sugars twice daily  . simvastatin (ZOCOR) 40 MG tablet Take 1 tablet (40 mg total) by mouth at bedtime.  . [DISCONTINUED] Alum & Mag Hydroxide-Simeth (MAGIC MOUTHWASH) SOLN 1 teaspoon gargle and swallow four times a day   . [DISCONTINUED] pantoprazole (PROTONIX) 40 MG tablet Take 1 tablet (40 mg total) by mouth daily.  . [DISCONTINUED] pravastatin (PRAVACHOL) 40 MG tablet Take 1 tablet (40 mg total) by mouth daily. (Patient not taking: Reported on 12/24/2018)   No facility-administered encounter medications on file as of 02/05/2019.     Activities of Daily Living In your present state of health, do you have any difficulty performing the following activities: 02/05/2019  Hearing? N  Vision? N  Difficulty concentrating or making decisions? N  Walking or climbing stairs? N  Dressing or bathing? N  Doing errands, shopping? N  Preparing Food and eating ? N  Using the Toilet? N  In the past six months, have you accidently leaked urine? N  Do you have problems with loss of bowel control? N  Managing your Medications? N  Managing your Finances? N  Housekeeping or managing your Housekeeping? N  Some recent data might  be hidden    Patient Care Team: Hoyt Koch, MD as PCP - General (Internal Medicine) Milus Banister, MD as Attending Physician (Gastroenterology) Konrad Felix, MD as Referring Physician (Ophthalmology) Thamas Jaegers, RMA    Assessment:   This is a routine wellness examination for Jennel. Physical assessment deferred to PCP.  Exercise Activities and Dietary recommendations Current Exercise Habits: Home exercise routine, Type of exercise: walking, Time (Minutes): 45, Frequency (Times/Week): 5, Weekly Exercise (Minutes/Week): 225, Intensity: Mild, Exercise limited by: orthopedic condition(s)  Diet (meal preparation, eat out, water intake, caffeinated beverages, dairy products, fruits and vegetables): in general, a "healthy" diet  , well balanced. Reports decreased appetite in the morning. eats a variety of fruits and vegetables daily, limits salt, fat/cholesterol, sugar,carbohydrates,caffeine, drinks 6-8 glasses of water daily.   Discussed supplementing with Ensure, coupons provided.  Goals   None     Fall Risk Fall Risk  02/05/2019 09/10/2018 09/03/2017 01/31/2016 12/31/2012  Falls in the past year? 0 0 No No No  Number falls in past yr: 0 - - - -  Follow up Falls prevention discussed - - - -    Depression Screen PHQ 2/9 Scores 02/05/2019 09/10/2018 09/03/2017 01/31/2016  PHQ - 2 Score 1 0 1 0     Cognitive Function       Ad8 score reviewed for issues:  Issues making decisions: no  Less interest in hobbies / activities: no  Repeats questions, stories (family complaining): no  Trouble using ordinary gadgets (microwave, computer, phone):no  Forgets the month or year: no  Mismanaging finances: no  Remembering appts: no  Daily problems with thinking and/or memory: no Ad8 score is= 0  Immunization History  Administered Date(s) Administered  . H1N1 09/30/2008  . Influenza Split 06/28/2011, 07/02/2012  . Influenza Whole 06/30/2008, 07/15/2009,  06/28/2010  . Influenza, High Dose Seasonal PF  06/21/2016, 07/05/2017  . Influenza,inj,Quad PF,6+ Mos 07/07/2013, 07/09/2014, 07/13/2015  . Influenza-Unspecified 06/02/2018  . Pneumococcal Conjugate-13 01/12/2015  . Pneumococcal Polysaccharide-23 06/28/2009  . Td 12/28/2009   Screening Tests Health Maintenance  Topic Date Due  . OPHTHALMOLOGY EXAM  09/24/2018  . HEMOGLOBIN A1C  03/12/2019  . INFLUENZA VACCINE  05/09/2019  . FOOT EXAM  07/12/2019  . URINE MICROALBUMIN  09/11/2019  . TETANUS/TDAP  12/29/2019  . COLONOSCOPY  11/07/2021  . DEXA SCAN  Completed  . PNA vac Low Risk Adult  Completed      Plan:      Reviewed health maintenance screenings with patient today and relevant education, vaccines, and/or referrals were provided.   Bring a copy of your living will and/or healthcare power of attorney to your next office visit.  Continue doing brain stimulating activities (puzzles, reading, adult coloring books, staying active) to keep memory sharp.   I have personally reviewed and noted the following in the patient's chart:   . Medical and social history . Use of alcohol, tobacco or illicit drugs  . Current medications and supplements . Functional ability and status . Nutritional status . Physical activity . Advanced directives . List of other physicians . Screenings to include cognitive, depression, and falls . Referrals and appointments  In addition, I have reviewed and discussed with patient certain preventive protocols, quality metrics, and best practice recommendations. A written personalized care plan for preventive services as well as general preventive health recommendations were provided to patient.     Michiel Cowboy, RN  02/05/2019

## 2019-02-10 ENCOUNTER — Encounter: Payer: Self-pay | Admitting: Internal Medicine

## 2019-02-10 ENCOUNTER — Ambulatory Visit: Payer: Medicare HMO

## 2019-02-12 ENCOUNTER — Encounter: Payer: Self-pay | Admitting: Internal Medicine

## 2019-02-13 MED ORDER — SIMVASTATIN 20 MG PO TABS: 20.0000 mg | ORAL_TABLET | Freq: Every day | ORAL | 3 refills | Status: DC

## 2019-02-20 ENCOUNTER — Telehealth: Payer: Self-pay

## 2019-02-20 NOTE — Telephone Encounter (Signed)
Copied from [place] [id number]. Topic: General - Other >> Feb 20, 2019 12:23 PM Oneta Rack wrote:  Relation to pt: self Call back number:[phone]   Reason for call:  Patient stated thank you for the 3 refills of simvastatin (ZOCOR) 20 MG tablet sent over on 02/13/2019 but would like it noted after 3 refills are up please send 90 day supply to  [facility] [place], [place] to [employer] [phone] (Phone) [phone] (Fax)

## 2019-02-20 NOTE — Telephone Encounter (Signed)
Noted  

## 2019-03-06 ENCOUNTER — Telehealth: Payer: Self-pay

## 2019-03-06 NOTE — Telephone Encounter (Signed)
LVM for patient to call back and push her appointment back a month or two or change to a doxy if she still would like to have a visit with Dr. Sharlet Salina. Dr. Sharlet Salina not in office on wednesdays

## 2019-03-09 ENCOUNTER — Other Ambulatory Visit: Payer: Self-pay | Admitting: *Deleted

## 2019-03-09 DIAGNOSIS — N2889 Other specified disorders of kidney and ureter: Secondary | ICD-10-CM

## 2019-03-11 ENCOUNTER — Ambulatory Visit: Payer: Medicare HMO | Admitting: Internal Medicine

## 2019-03-19 ENCOUNTER — Ambulatory Visit (HOSPITAL_COMMUNITY)
Admission: RE | Admit: 2019-03-19 | Discharge: 2019-03-19 | Disposition: A | Discharge status: Home / Self Care | Payer: Medicare HMO | Source: Ambulatory Visit | Attending: Interventional Radiology | Admitting: Interventional Radiology

## 2019-03-19 ENCOUNTER — Other Ambulatory Visit: Payer: Self-pay

## 2019-03-19 DIAGNOSIS — N2889 Other specified disorders of kidney and ureter: Secondary | ICD-10-CM | POA: Insufficient documentation

## 2019-03-19 DIAGNOSIS — C642 Malignant neoplasm of left kidney, except renal pelvis: Secondary | ICD-10-CM | POA: Diagnosis not present

## 2019-03-19 LAB — POCT I-STAT CREATININE: Creatinine, Ser: 1 mg/dL (ref 0.44–1.00)

## 2019-03-19 MED ORDER — IOHEXOL 300 MG/ML  SOLN
100.0000 mL | Freq: Once | INTRAMUSCULAR | Status: AC | PRN
  Administered 2019-03-19: 100 mL via INTRAVENOUS

## 2019-03-19 MED ORDER — SODIUM CHLORIDE (PF) 0.9 % IJ SOLN
INTRAMUSCULAR | Status: AC
  Filled 2019-03-19: qty 50

## 2019-03-25 ENCOUNTER — Other Ambulatory Visit: Payer: Self-pay

## 2019-03-25 ENCOUNTER — Ambulatory Visit (HOSPITAL_COMMUNITY)
Admission: RE | Admit: 2019-03-25 | Discharge: 2019-03-25 | Disposition: A | Discharge status: Home / Self Care | Payer: Medicare HMO | Source: Ambulatory Visit | Attending: Urology | Admitting: Urology

## 2019-03-25 ENCOUNTER — Other Ambulatory Visit (HOSPITAL_COMMUNITY): Payer: Self-pay | Admitting: Urology

## 2019-03-25 DIAGNOSIS — D49512 Neoplasm of unspecified behavior of left kidney: Secondary | ICD-10-CM | POA: Insufficient documentation

## 2019-03-25 DIAGNOSIS — R69 Illness, unspecified: Secondary | ICD-10-CM | POA: Diagnosis not present

## 2019-03-25 DIAGNOSIS — C642 Malignant neoplasm of left kidney, except renal pelvis: Secondary | ICD-10-CM | POA: Diagnosis not present

## 2019-03-26 ENCOUNTER — Telehealth: Payer: Self-pay

## 2019-03-26 NOTE — Telephone Encounter (Signed)
LVM for patient to call back and let us know if she would like to do a virtual visit. If not need to go over screening process and new check in process

## 2019-03-27 ENCOUNTER — Encounter: Payer: Self-pay | Admitting: Internal Medicine

## 2019-03-27 ENCOUNTER — Other Ambulatory Visit: Payer: Self-pay

## 2019-03-27 ENCOUNTER — Ambulatory Visit (INDEPENDENT_AMBULATORY_CARE_PROVIDER_SITE_OTHER): Payer: Medicare HMO | Admitting: Internal Medicine

## 2019-03-27 DIAGNOSIS — E119 Type 2 diabetes mellitus without complications: Secondary | ICD-10-CM

## 2019-03-27 DIAGNOSIS — E785 Hyperlipidemia, unspecified: Secondary | ICD-10-CM | POA: Diagnosis not present

## 2019-03-27 DIAGNOSIS — E1169 Type 2 diabetes mellitus with other specified complication: Secondary | ICD-10-CM | POA: Diagnosis not present

## 2019-03-27 DIAGNOSIS — N2889 Other specified disorders of kidney and ureter: Secondary | ICD-10-CM | POA: Diagnosis not present

## 2019-03-27 NOTE — Assessment & Plan Note (Signed)
Checking HgA1c and adjust as needed. Taking statin. Has been in pre-diabetes range HgA1c for some years but prior diabetes. Counseled about yearly eye exam which has been delayed due to pandemic.

## 2019-03-27 NOTE — Progress Notes (Signed)
Virtual Visit via Video Note  I connected with Jenny Madden on 03/27/19 at 10:20 AM EDT by a video enabled telemedicine application and verified that I am speaking with the correct person using two identifiers.  The patient and the provider were at separate locations throughout the entire encounter.   I discussed the limitations of evaluation and management by telemedicine and the availability of in person appointments. The patient expressed understanding and agreed to proceed.  History of Present Illness: The patient is a 78 y.o. female with visit for follow up diabetes (diet controlled, denies new numbness or tingling in hands or feet, denies change in diet, not exercising lately due to pandemic, taking statin) and cholesterol (is taking simvastatin 20 mg daily, feels that she is having some ankle swelling intermittent, denies muscle aches, denies stomach problems, denies headaches or chest pains or stroke symptoms) and renal mass (s/p ablation, recent CT abdomen and CXR which she has not had results, denies change in urination or blood in urine, denies pain in stomach, denies diarrhea or constipation).  Observations/Objective: Appearance: normal, breathing appears normal, casual grooming, abdomen does not appear distended, throat normal, mental status is A and O times 3  Assessment and Plan: See problem oriented charting  Follow Up Instructions: checking lipid panel, HgA1c, reviewed results with patient  I discussed the assessment and treatment plan with the patient. The patient was provided an opportunity to ask questions and all were answered. The patient agreed with the plan and demonstrated an understanding of the instructions.   The patient was advised to call back or seek an in-person evaluation if the symptoms worsen or if the condition fails to improve as anticipated.  Hoyt Koch, MD

## 2019-03-27 NOTE — Assessment & Plan Note (Signed)
Discussed CT abdomen and CXR results with patient during visit and questions answered.

## 2019-03-27 NOTE — Assessment & Plan Note (Signed)
Checking lipid panel and adjust simvastatin 20 mg daily as needed.  

## 2019-03-31 ENCOUNTER — Ambulatory Visit
Admission: RE | Admit: 2019-03-31 | Discharge: 2019-03-31 | Disposition: A | Discharge status: Home / Self Care | Payer: Medicare HMO | Source: Ambulatory Visit | Attending: Interventional Radiology | Admitting: Interventional Radiology

## 2019-03-31 ENCOUNTER — Encounter: Payer: Self-pay | Admitting: *Deleted

## 2019-03-31 ENCOUNTER — Encounter: Payer: Self-pay | Admitting: Internal Medicine

## 2019-03-31 ENCOUNTER — Other Ambulatory Visit: Payer: Self-pay

## 2019-03-31 DIAGNOSIS — Z9889 Other specified postprocedural states: Secondary | ICD-10-CM | POA: Diagnosis not present

## 2019-03-31 DIAGNOSIS — Z87448 Personal history of other diseases of urinary system: Secondary | ICD-10-CM | POA: Diagnosis not present

## 2019-03-31 DIAGNOSIS — N2889 Other specified disorders of kidney and ureter: Secondary | ICD-10-CM

## 2019-03-31 HISTORY — PX: IR RADIOLOGIST EVAL & MGMT: IMG5224

## 2019-03-31 NOTE — Progress Notes (Signed)
Chief Complaint: Patient was consulted remotely today (TeleHealth) for follow up after left renal mass cryoablation.  History of Present Illness: Jenny Madden is a 78 y.o. female status post percutaneous cryoablation of a left renal mass on 01/18/2016.  Jenny Madden has been doing well and has no complaints.  Past Medical History:  Diagnosis Date   Allergic rhinitis, cause unspecified    Anxiety state, unspecified    none recent   Benign neoplasm of colon    Cancer ([place])    renal mass   Cataract    Chronic kidney disease 01/2016   Tumor left side   Diverticulosis of colon (without mention of hemorrhage)    Dysphagia    Family history of adverse reaction to anesthesia 3 yrs ago   slow to awaken    Family history of malignant neoplasm of gastrointestinal tract    Heart murmur    Hypertension    Nonspecific (abnormal) findings on radiological and other examination of other intrathoracic organs    Nonspecific abnormal electrocardiogram (ECG) (EKG)    Osteoporosis    Other dysphagia    Pure hypercholesterolemia    Type II or unspecified type diabetes mellitus without mention of complication, not stated as uncontrolled    Unspecified essential hypertension    Unspecified tinnitus years ago    Past Surgical History:  Procedure Laterality Date   ABLATION Left 01/2016   Renal mass   CESAREAN SECTION     x 1   COLONOSCOPY     colonscopy and endoscopy  5 yrs ago   FOOT SURGERY     bilateral for hammer toes   IR GENERIC HISTORICAL  12/06/2015   IR RADIOLOGIST EVAL & MGMT 12/06/2015 Jenny Edouard, MD [facility] INTERV RAD   IR GENERIC HISTORICAL  05/03/2016   IR RADIOLOGIST EVAL & MGMT 05/03/2016 Jenny Edouard, MD [facility] INTERV RAD   IR GENERIC HISTORICAL  02/09/2016   IR RADIOLOGIST EVAL & MGMT 02/09/2016 [facility] INTERV RAD   IR RADIOLOGIST EVAL & MGMT  01/09/2017   IR RADIOLOGIST EVAL & MGMT  01/14/2018   IR RADIOLOGIST EVAL & MGMT  03/31/2019    UPPER GASTROINTESTINAL ENDOSCOPY      Allergies: Patient has no known allergies.  Medications: Prior to Admission medications   Medication Sig Start Date End Date Taking? Authorizing Provider  acetaminophen (TYLENOL) 325 MG tablet Take 2 tablets (650 mg total) by mouth every 6 (six) hours as needed for moderate pain. 12/24/18   Fawze, Mina A, PA-C  amLODipine (NORVASC) 10 MG tablet TAKE 1 TABLET DAILY Patient taking differently: Take 10 mg by mouth daily.  09/01/18   Hoyt Koch, MD  aspirin 81 MG tablet Take 81 mg by mouth every morning.     [provider]  blood glucose meter kit and supplies Dispense based on patient and insurance preference. Use up to four times daily as directed. (E11.9). 05/16/18   Hoyt Koch, MD  Blood Glucose Monitoring Suppl (ONE TOUCH ULTRA 2) w/Device KIT USE TO CHECK BLOOD SUGAR   TWO TIMES A DAY 09/01/18   Hoyt Koch, MD  Cholecalciferol (VITAMIN D) 2000 UNITS CAPS Take 2,000 Units by mouth daily.     [provider]  famotidine (PEPCID) 40 MG tablet Take 1 tablet (40 mg total) by mouth daily. 11/26/18   Hoyt Koch, MD  fexofenadine (ALLEGRA) 180 MG tablet Take 1 tablet (180 mg total) by mouth daily. Take 1 tablet by mouth  once daily as needed for allergies 07/14/18   Hoyt Koch, MD  fluticasone [facility]) 50 MCG/ACT nasal spray Place 2 sprays into both nostrils daily. 01/30/17   Hoyt Koch, MD  glucose blood test strip CHECK BLOOD SUGAR TWO TIMESA DAY 12/22/18   Hoyt Koch, MD  omeprazole (PRILOSEC) 40 MG capsule Take 1 capsule (40 mg total) by mouth 2 (two) times daily before a meal. 07/02/18   Burns, Claudina Lick, MD  ONE TOUCH LANCETS MISC Used to check blood sugars twice daily 05/26/18   Hoyt Koch, MD  simvastatin (ZOCOR) 20 MG tablet Take 1 tablet (20 mg total) by mouth at bedtime. 02/13/19   Hoyt Koch, MD  Alum & Mag Hydroxide-Simeth (MAGIC MOUTHWASH) SOLN  1 teaspoon gargle and swallow four times a day   12/27/11  [provider]  pantoprazole (PROTONIX) 40 MG tablet Take 1 tablet (40 mg total) by mouth daily. 07/01/18 07/01/18  Binnie Rail, MD     Family History  Problem Relation Age of Onset   Stroke Father    Lung cancer Father    Coronary artery disease Father    Hypertension Mother    Heart attack Mother    Colon cancer Brother 27   Esophageal cancer Neg Hx    Rectal cancer Neg Hx    Stomach cancer Neg Hx     Social History   Socioeconomic History   Marital status: Widowed    Spouse name: bob x 37 yrs   Number of children: 3   Years of education: Not on file   Highest education level: Not on file  Occupational History   Occupation: Airline pilot: RETIRED  Social Designer, fashion/clothing strain: Not hard at all   Food insecurity    Worry: Never true    Inability: Never true   Transportation needs    Medical: No    Non-medical: No  Tobacco Use   Smoking status: Former Smoker    Types: Cigarettes    Quit date: 10/09/1999    Years since quitting: 19.4   Smokeless tobacco: Never Used  Substance and Sexual Activity   Alcohol use: Yes    Alcohol/week: 1.0 standard drinks    Types: 1 Standard drinks or equivalent per week    Comment: social use   Drug use: No   Sexual activity: Never    Comment: 1st intercourse- 49, partners- 3, widow   Lifestyle   Physical activity    Days per week: 5 days    Minutes per session: 50 min   Stress: Only a little  Relationships   Social connections    Talks on phone: More than three times a week    Gets together: More than three times a week    Attends religious service: More than 4 times per year    Active member of club or organization: Yes    Attends meetings of clubs or organizations: More than 4 times per year    Relationship status: Widowed  Other Topics Concern   Not on file  Social History Narrative   Married to husband, [name]  x37yr   3 children - 1 son w/ Hodgkin's, 2 daughters alive and well   High school education     Review of Systems  Constitutional: Negative.   Respiratory: Negative.   Cardiovascular: Negative.   Gastrointestinal: Negative.   Genitourinary: Negative.   Musculoskeletal: Negative.   Neurological: Negative.  Review of Systems: A 12 point ROS discussed and pertinent positives are indicated in the HPI above.  All other systems are negative.  Physical Exam No direct physical exam was performed.  Vital Signs: There were no vitals taken for this visit.  Imaging: Dg Chest 2 View  Result Date: 03/25/2019 CLINICAL DATA:  Neoplasm left kidney EXAM: CHEST - 2 VIEW COMPARISON:  12/13/2017 FINDINGS: No focal opacity or pleural effusion. Stable cardiomediastinal silhouette. No pneumothorax. Scoliosis of the spine. IMPRESSION: No active cardiopulmonary disease. Electronically Signed   By: Donavan Foil M.D.   On: 03/25/2019 20:26   Ct Abdomen W Wo Contrast  Result Date: 03/19/2019 CLINICAL DATA:  Cryoablation interpolar left renal cell carcinoma 01/18/2016. Routine follow-up. EXAM: CT ABDOMEN WITHOUT AND WITH CONTRAST TECHNIQUE: Multidetector CT imaging of the abdomen was performed following the standard protocol before and following the bolus administration of intravenous contrast. CONTRAST:  153m OMNIPAQUE IOHEXOL 300 MG/ML  SOLN COMPARISON:  01/08/2017 CT abdomen. 12/13/2017 CT angiogram of the chest and CT abdomen/pelvis. FINDINGS: Lower chest: Right middle lobe solid 3 mm pulmonary nodule (series 3/image 14) is stable and considered benign. No acute abnormality at the lung bases. Coronary atherosclerosis. Hepatobiliary: Normal liver with no liver mass. Normal gallbladder with no radiopaque cholelithiasis. No biliary ductal dilatation. Pancreas: Normal, with no mass or duct dilation. Spleen: Normal size. No mass. Adrenals/Urinary Tract: Normal adrenals. No renal stones. No hydronephrosis.  Simple 5.3 cm upper left renal cyst. 1.1 cm lower left renal cyst. Ablation defect in posterior interpolar left kidney measures 1.2 x 0.8 cm (series 9/image 43), previously 1.2 x 0 9 cm on 01/08/2017 CT, unchanged, with no new mass or enhancement at the ablation site. No new or suspicious renal masses. Stomach/Bowel: Normal non-distended stomach. Visualized small and large bowel is normal caliber, with no bowel wall thickening. Moderate colonic diverticulosis. Vascular/Lymphatic: Atherosclerotic nonaneurysmal abdominal aorta. Patent portal, splenic, hepatic and renal veins. No pathologically enlarged lymph nodes in the abdomen. Other: No pneumoperitoneum, ascites or focal fluid collection. Musculoskeletal: No aggressive appearing focal osseous lesions. Moderate thoracolumbar spondylosis. IMPRESSION: 1. Stable ablation defect in the posterior interpolar left kidney, with no evidence of local tumor recurrence. 2. No findings of metastatic disease in the abdomen. 3. Chronic findings include: Aortic Atherosclerosis (ICD10-I70.0). Coronary atherosclerosis. Moderate colonic diverticulosis. Electronically Signed   By: JIlona SorrelM.D.   On: 03/19/2019 10:12   Ir Radiologist Eval & Mgmt  Result Date: 03/31/2019 Please refer to notes tab for details about interventional procedure. (Op Note)   Labs:  CBC: Recent Labs    04/24/18 1650 09/10/18 0923  WBC 7.6 5.9  HGB 12.9 13.7  HCT 41.2 42.3  PLT 299 246.0    COAGS: No results for input(s): INR, APTT in the last 8760 hours.  BMP: Recent Labs    04/24/18 1650 06/25/18 0911 09/10/18 0923 03/19/19 0904  NA 141 140 139  --   K 3.7 4.2 4.0  --   CL 107 105 105  --   CO2 '24 28 26  ' --   GLUCOSE 100* 104* 114*  --   BUN '12 9 13  ' --   CALCIUM 9.8 10.1 9.8  --   CREATININE 0.89 1.00 0.95 1.00  GFRNONAA >60  --   --   --   GFRAA >60  --   --   --     LIVER FUNCTION TESTS: Recent Labs    04/24/18 1650 06/25/18 0911 09/10/18 0923  BILITOT  0.2*  0.3 0.4  AST '23 19 19  ' ALT '14 11 14  ' ALKPHOS 69 74 79  PROT 7.7 7.8 7.8  ALBUMIN 4.2 4.3 4.3    Assessment and Plan:  The follow-up CT dated 03/19/2019 demonstrates a stable ablation defect and scar in the medial interpolar region of the left kidney without evidence of recurrent enhancing tumor.  No new masses are identified.  There is no evidence of tumor recurrence 3 years after ablation.  I recommended another follow-up CT in 1 year.  Electronically Signed: Azzie Roup 03/31/2019, 12:01 PM     I spent a total of 10 Minutes in remote  clinical consultation, greater than 50% of which was counseling/coordinating care post ablation of a left renal mass.    Visit type: Audio only (telephone). Audio (no video) only due to video function was not working in My Chart.. Alternative for in-person consultation at [facility], [address] [place], [place], [place]. This visit type was conducted due to national recommendations for restrictions regarding the COVID-19 Pandemic (e.g. social distancing).  This format is felt to be most appropriate for this patient at this time.  All issues noted in this document were discussed and addressed.

## 2019-04-01 ENCOUNTER — Other Ambulatory Visit (INDEPENDENT_AMBULATORY_CARE_PROVIDER_SITE_OTHER): Payer: Medicare HMO

## 2019-04-01 DIAGNOSIS — E1169 Type 2 diabetes mellitus with other specified complication: Secondary | ICD-10-CM | POA: Diagnosis not present

## 2019-04-01 DIAGNOSIS — E119 Type 2 diabetes mellitus without complications: Secondary | ICD-10-CM | POA: Diagnosis not present

## 2019-04-01 DIAGNOSIS — E785 Hyperlipidemia, unspecified: Secondary | ICD-10-CM | POA: Diagnosis not present

## 2019-04-01 LAB — LIPID PANEL
Cholesterol: 169 mg/dL (ref 0–200)
HDL: 63.2 mg/dL (ref >39.00)
LDL Cholesterol: 93 mg/dL (ref 0–99)
NonHDL: 105.47
Total CHOL/HDL Ratio: 3
Triglycerides: 64 mg/dL (ref 0.0–149.0)
VLDL: 12.8 mg/dL (ref 0.0–40.0)

## 2019-04-01 LAB — COMPREHENSIVE METABOLIC PANEL
ALT: 11 U/L (ref 0–35)
AST: 17 U/L (ref 0–37)
Albumin: 4.2 g/dL (ref 3.5–5.2)
Alkaline Phosphatase: 70 U/L (ref 39–117)
BUN: 12 mg/dL (ref 6–23)
CO2: 25 mEq/L (ref 19–32)
Calcium: 9.5 mg/dL (ref 8.4–10.5)
Chloride: 106 mEq/L (ref 96–112)
Creatinine, Ser: 1.04 mg/dL (ref 0.40–1.20)
GFR: 61.91 mL/min (ref >60.00)
Glucose, Bld: 95 mg/dL (ref 70–99)
Potassium: 4 mEq/L (ref 3.5–5.1)
Sodium: 140 mEq/L (ref 135–145)
Total Bilirubin: 0.3 mg/dL (ref 0.2–1.2)
Total Protein: 7.5 g/dL (ref 6.0–8.3)

## 2019-04-01 LAB — HEMOGLOBIN A1C: Hgb A1c MFr Bld: 6.2 % (ref 4.6–6.5)

## 2019-04-15 DIAGNOSIS — H11441 Conjunctival cysts, right eye: Secondary | ICD-10-CM | POA: Diagnosis not present

## 2019-05-06 ENCOUNTER — Telehealth: Payer: Self-pay | Admitting: Internal Medicine

## 2019-05-06 DIAGNOSIS — R69 Illness, unspecified: Secondary | ICD-10-CM | POA: Diagnosis not present

## 2019-05-06 MED ORDER — GLUCOSE BLOOD VI STRP: ORAL_STRIP | 2 refills | Status: DC

## 2019-05-06 MED ORDER — OMEPRAZOLE 40 MG PO CPDR: 40.0000 mg | DELAYED_RELEASE_CAPSULE | Freq: Two times a day (BID) | ORAL | 1 refills | Status: DC

## 2019-05-06 NOTE — Telephone Encounter (Signed)
Medication: omeprazole (PRILOSEC) 40 MG capsule [[id number]  glucose blood test strip [[id number]]     Pharmacy:  [place], [place] to [facility] [phone] (Phone) [phone] (Fax)

## 2019-05-06 NOTE — Telephone Encounter (Signed)
Sent!

## 2019-05-18 DIAGNOSIS — R69 Illness, unspecified: Secondary | ICD-10-CM | POA: Diagnosis not present

## 2019-05-26 ENCOUNTER — Other Ambulatory Visit: Payer: Self-pay | Admitting: Internal Medicine

## 2019-06-11 ENCOUNTER — Other Ambulatory Visit: Payer: Self-pay

## 2019-06-11 ENCOUNTER — Ambulatory Visit (INDEPENDENT_AMBULATORY_CARE_PROVIDER_SITE_OTHER): Payer: Medicare HMO

## 2019-06-11 DIAGNOSIS — Z23 Encounter for immunization: Secondary | ICD-10-CM

## 2019-06-18 DIAGNOSIS — R69 Illness, unspecified: Secondary | ICD-10-CM | POA: Diagnosis not present

## 2019-06-29 DIAGNOSIS — Z961 Presence of intraocular lens: Secondary | ICD-10-CM | POA: Diagnosis not present

## 2019-06-29 DIAGNOSIS — L820 Inflamed seborrheic keratosis: Secondary | ICD-10-CM | POA: Diagnosis not present

## 2019-06-29 DIAGNOSIS — E119 Type 2 diabetes mellitus without complications: Secondary | ICD-10-CM | POA: Diagnosis not present

## 2019-06-29 DIAGNOSIS — H40023 Open angle with borderline findings, high risk, bilateral: Secondary | ICD-10-CM | POA: Diagnosis not present

## 2019-06-29 LAB — HM DIABETES EYE EXAM

## 2019-06-30 ENCOUNTER — Encounter: Payer: Self-pay | Admitting: Internal Medicine

## 2019-06-30 NOTE — Progress Notes (Unsigned)
Abstracted and sent to scan  

## 2019-07-09 ENCOUNTER — Other Ambulatory Visit: Payer: Self-pay | Admitting: Internal Medicine

## 2019-07-09 DIAGNOSIS — Z1231 Encounter for screening mammogram for malignant neoplasm of breast: Secondary | ICD-10-CM

## 2019-07-24 ENCOUNTER — Other Ambulatory Visit: Payer: Self-pay | Admitting: Internal Medicine

## 2019-07-24 MED ORDER — SIMVASTATIN 20 MG PO TABS: 20.0000 mg | ORAL_TABLET | Freq: Every day | ORAL | 3 refills | Status: DC

## 2019-07-24 NOTE — Telephone Encounter (Signed)
Pt would like medication  simvastatin (ZOCOR) 20 MG tablet [id number] transferred to  [place], [place] to [facility] [phone] (Phone) [phone] (Fax)   Please advise

## 2019-07-29 DIAGNOSIS — R69 Illness, unspecified: Secondary | ICD-10-CM | POA: Diagnosis not present

## 2019-07-30 DIAGNOSIS — L820 Inflamed seborrheic keratosis: Secondary | ICD-10-CM | POA: Diagnosis not present

## 2019-08-04 ENCOUNTER — Telehealth: Payer: Self-pay | Admitting: Internal Medicine

## 2019-08-04 NOTE — Telephone Encounter (Signed)
Noted  

## 2019-08-04 NOTE — Telephone Encounter (Signed)
Left pt vm to call back to schedule. 

## 2019-08-04 NOTE — Telephone Encounter (Signed)
Pt is having problems with her esophagus again and would like to speak with the nurse about her medication that she thinks gives her more gas than it works./ please advise

## 2019-08-05 ENCOUNTER — Ambulatory Visit (INDEPENDENT_AMBULATORY_CARE_PROVIDER_SITE_OTHER): Payer: Medicare HMO | Admitting: Internal Medicine

## 2019-08-05 ENCOUNTER — Other Ambulatory Visit: Payer: Self-pay

## 2019-08-05 ENCOUNTER — Encounter: Payer: Self-pay | Admitting: Internal Medicine

## 2019-08-05 VITALS — BP 136/70 | HR 80 | Temp 98.5°F | Ht 63.0 in | Wt 157.0 lb

## 2019-08-05 DIAGNOSIS — K222 Esophageal obstruction: Secondary | ICD-10-CM | POA: Diagnosis not present

## 2019-08-05 MED ORDER — FAMOTIDINE 40 MG PO TABS: 40.0000 mg | ORAL_TABLET | Freq: Every day | ORAL | 3 refills | Status: DC

## 2019-08-05 NOTE — Patient Instructions (Addendum)
We will have you go back to Dr. Ardis Hughs to have him open back up the area which is narrow.  [phone]

## 2019-08-05 NOTE — Assessment & Plan Note (Signed)
This is likely stenosed again. Refilled pepcid and asked her to call her GI specialist to get back in with him and likely EGD with dilation.

## 2019-08-05 NOTE — Progress Notes (Signed)
   Subjective:   Patient ID: Jenny Madden, female    DOB: [birth year], 78 y.o.   MRN: [id number]  HPI The patient is a 78 YO female coming in for concerns about her stomach. She does have GERD and takes pepcid and prilosec. She has been out of pepcid for some time. She has recurrent stenosis of the esophageal valve and s/p multiple dilations. She is having moderate to severe problems with food getting stuck. Solids and sometimes liquids and can feel like the food is stuck there for hours. Denies having to regurgitate the food back up. Denies weight loss. Denies blood in stool. Denies missing omeprazole.  Review of Systems  Constitutional: Negative.   HENT: Positive for trouble swallowing.   Eyes: Negative.   Respiratory: Negative for cough, chest tightness and shortness of breath.   Cardiovascular: Negative for chest pain, palpitations and leg swelling.  Gastrointestinal: Negative for abdominal distention, abdominal pain, anal bleeding, blood in stool, constipation, diarrhea, nausea, rectal pain and vomiting.  Musculoskeletal: Negative.   Skin: Negative.   Neurological: Negative.   Psychiatric/Behavioral: Negative.     Objective:  Physical Exam Constitutional:      Appearance: She is well-developed.  HENT:     Head: Normocephalic and atraumatic.  Neck:     Musculoskeletal: Normal range of motion.  Cardiovascular:     Rate and Rhythm: Normal rate and regular rhythm.  Pulmonary:     Effort: Pulmonary effort is normal. No respiratory distress.     Breath sounds: Normal breath sounds. No wheezing or rales.  Abdominal:     General: Bowel sounds are normal. There is no distension.     Palpations: Abdomen is soft.     Tenderness: There is abdominal tenderness. There is no rebound.     Comments: Mild tenderness in the region of the epigastric at the bottom of the sternum  Skin:    General: Skin is warm and dry.  Neurological:     Mental Status: She is alert and oriented to person,  place, and time.     Coordination: Coordination normal.     Vitals:   08/05/19 0918  BP: 136/70  Pulse: 80  Temp: 98.5 F (36.9 C)  TempSrc: Oral  SpO2: 98%  Weight: 157 lb (71.2 kg)  Height: 5\' 3"  (1.6 m)    Assessment & Plan:

## 2019-08-09 ENCOUNTER — Encounter: Payer: Self-pay | Admitting: Internal Medicine

## 2019-08-28 ENCOUNTER — Other Ambulatory Visit: Payer: Self-pay

## 2019-08-28 ENCOUNTER — Ambulatory Visit
Admission: RE | Admit: 2019-08-28 | Discharge: 2019-08-28 | Disposition: A | Discharge status: Home / Self Care | Payer: Medicare HMO | Source: Ambulatory Visit | Attending: Internal Medicine | Admitting: Internal Medicine

## 2019-08-28 DIAGNOSIS — Z1231 Encounter for screening mammogram for malignant neoplasm of breast: Secondary | ICD-10-CM

## 2019-09-10 DIAGNOSIS — M1990 Unspecified osteoarthritis, unspecified site: Secondary | ICD-10-CM | POA: Diagnosis not present

## 2019-09-10 DIAGNOSIS — E119 Type 2 diabetes mellitus without complications: Secondary | ICD-10-CM | POA: Diagnosis not present

## 2019-09-10 DIAGNOSIS — Z809 Family history of malignant neoplasm, unspecified: Secondary | ICD-10-CM | POA: Diagnosis not present

## 2019-09-10 DIAGNOSIS — Z87891 Personal history of nicotine dependence: Secondary | ICD-10-CM | POA: Diagnosis not present

## 2019-09-10 DIAGNOSIS — I10 Essential (primary) hypertension: Secondary | ICD-10-CM | POA: Diagnosis not present

## 2019-09-10 DIAGNOSIS — Z7982 Long term (current) use of aspirin: Secondary | ICD-10-CM | POA: Diagnosis not present

## 2019-09-10 DIAGNOSIS — K219 Gastro-esophageal reflux disease without esophagitis: Secondary | ICD-10-CM | POA: Diagnosis not present

## 2019-09-10 DIAGNOSIS — Z85528 Personal history of other malignant neoplasm of kidney: Secondary | ICD-10-CM | POA: Diagnosis not present

## 2019-09-10 DIAGNOSIS — Z833 Family history of diabetes mellitus: Secondary | ICD-10-CM | POA: Diagnosis not present

## 2019-09-10 DIAGNOSIS — E785 Hyperlipidemia, unspecified: Secondary | ICD-10-CM | POA: Diagnosis not present

## 2019-09-16 ENCOUNTER — Encounter: Payer: Self-pay | Admitting: Internal Medicine

## 2019-09-16 ENCOUNTER — Other Ambulatory Visit (INDEPENDENT_AMBULATORY_CARE_PROVIDER_SITE_OTHER): Payer: Medicare HMO

## 2019-09-16 ENCOUNTER — Ambulatory Visit (INDEPENDENT_AMBULATORY_CARE_PROVIDER_SITE_OTHER): Payer: Medicare HMO | Admitting: Internal Medicine

## 2019-09-16 ENCOUNTER — Other Ambulatory Visit: Payer: Self-pay

## 2019-09-16 VITALS — BP 140/70 | HR 77 | Temp 97.7°F | Ht 63.0 in | Wt 158.0 lb

## 2019-09-16 DIAGNOSIS — K219 Gastro-esophageal reflux disease without esophagitis: Secondary | ICD-10-CM | POA: Diagnosis not present

## 2019-09-16 DIAGNOSIS — E119 Type 2 diabetes mellitus without complications: Secondary | ICD-10-CM

## 2019-09-16 DIAGNOSIS — E785 Hyperlipidemia, unspecified: Secondary | ICD-10-CM | POA: Diagnosis not present

## 2019-09-16 DIAGNOSIS — E1169 Type 2 diabetes mellitus with other specified complication: Secondary | ICD-10-CM | POA: Diagnosis not present

## 2019-09-16 DIAGNOSIS — I10 Essential (primary) hypertension: Secondary | ICD-10-CM | POA: Diagnosis not present

## 2019-09-16 DIAGNOSIS — Z0000 Encounter for general adult medical examination without abnormal findings: Secondary | ICD-10-CM | POA: Diagnosis not present

## 2019-09-16 LAB — CBC
HCT: 41.5 % (ref 36.0–46.0)
Hemoglobin: 13.4 g/dL (ref 12.0–15.0)
MCHC: 32.4 g/dL (ref 30.0–36.0)
MCV: 82.5 fl (ref 78.0–100.0)
Platelets: 249 10*3/uL (ref 150.0–400.0)
RBC: 5.04 Mil/uL (ref 3.87–5.11)
RDW: 13.3 % (ref 11.5–15.5)
WBC: 6.9 10*3/uL (ref 4.0–10.5)

## 2019-09-16 LAB — LIPID PANEL
Cholesterol: 196 mg/dL (ref 0–200)
HDL: 69.2 mg/dL (ref >39.00)
LDL Cholesterol: 113 mg/dL — ABNORMAL HIGH (ref 0–99)
NonHDL: 126.73
Total CHOL/HDL Ratio: 3
Triglycerides: 69 mg/dL (ref 0.0–149.0)
VLDL: 13.8 mg/dL (ref 0.0–40.0)

## 2019-09-16 LAB — COMPREHENSIVE METABOLIC PANEL
ALT: 13 U/L (ref 0–35)
AST: 20 U/L (ref 0–37)
Albumin: 4.5 g/dL (ref 3.5–5.2)
Alkaline Phosphatase: 77 U/L (ref 39–117)
BUN: 15 mg/dL (ref 6–23)
CO2: 27 mEq/L (ref 19–32)
Calcium: 10.2 mg/dL (ref 8.4–10.5)
Chloride: 104 mEq/L (ref 96–112)
Creatinine, Ser: 1.08 mg/dL (ref 0.40–1.20)
GFR: 59.2 mL/min — ABNORMAL LOW (ref >60.00)
Glucose, Bld: 96 mg/dL (ref 70–99)
Potassium: 3.9 mEq/L (ref 3.5–5.1)
Sodium: 139 mEq/L (ref 135–145)
Total Bilirubin: 0.3 mg/dL (ref 0.2–1.2)
Total Protein: 8.2 g/dL (ref 6.0–8.3)

## 2019-09-16 LAB — MICROALBUMIN / CREATININE URINE RATIO
Creatinine,U: 50.7 mg/dL
Microalb Creat Ratio: 1.7 mg/g (ref 0.0–30.0)
Microalb, Ur: 0.9 mg/dL (ref 0.0–1.9)

## 2019-09-16 LAB — HEMOGLOBIN A1C: Hgb A1c MFr Bld: 6 % (ref 4.6–6.5)

## 2019-09-16 MED ORDER — FLUTICASONE PROPIONATE 50 MCG/ACT NA SUSP: 2.0000 | Freq: Every day | NASAL | 3 refills | Status: DC

## 2019-09-16 NOTE — Patient Instructions (Signed)
Health Maintenance, Female Adopting a healthy lifestyle and getting preventive care are important in promoting health and wellness. Ask your health care provider about:  The right schedule for you to have regular tests and exams.  Things you can do on your own to prevent diseases and keep yourself healthy. What should I know about diet, weight, and exercise? Eat a healthy diet   Eat a diet that includes plenty of vegetables, fruits, low-fat dairy products, and lean protein.  Do not eat a lot of foods that are high in solid fats, added sugars, or sodium. Maintain a healthy weight Body mass index (BMI) is used to identify weight problems. It estimates body fat based on height and weight. Your health care provider can help determine your BMI and help you achieve or maintain a healthy weight. Get regular exercise Get regular exercise. This is one of the most important things you can do for your health. Most adults should:  Exercise for at least 150 minutes each week. The exercise should increase your heart rate and make you sweat (moderate-intensity exercise).  Do strengthening exercises at least twice a week. This is in addition to the moderate-intensity exercise.  Spend less time sitting. Even light physical activity can be beneficial. Watch cholesterol and blood lipids Have your blood tested for lipids and cholesterol at 79 years of age, then have this test every 5 years. Have your cholesterol levels checked more often if:  Your lipid or cholesterol levels are high.  You are older than 79 years of age.  You are at high risk for heart disease. What should I know about cancer screening? Depending on your health history and family history, you may need to have cancer screening at various ages. This may include screening for:  Breast cancer.  Cervical cancer.  Colorectal cancer.  Skin cancer.  Lung cancer. What should I know about heart disease, diabetes, and high blood  pressure? Blood pressure and heart disease  High blood pressure causes heart disease and increases the risk of stroke. This is more likely to develop in people who have high blood pressure readings, are of African descent, or are overweight.  Have your blood pressure checked: ? Every 3-5 years if you are 18-39 years of age. ? Every year if you are 40 years old or older. Diabetes Have regular diabetes screenings. This checks your fasting blood sugar level. Have the screening done:  Once every three years after age 40 if you are at a normal weight and have a low risk for diabetes.  More often and at a younger age if you are overweight or have a high risk for diabetes. What should I know about preventing infection? Hepatitis B If you have a higher risk for hepatitis B, you should be screened for this virus. Talk with your health care provider to find out if you are at risk for hepatitis B infection. Hepatitis C Testing is recommended for:  Everyone born from 1945 through 1965.  Anyone with known risk factors for hepatitis C. Sexually transmitted infections (STIs)  Get screened for STIs, including gonorrhea and chlamydia, if: ? You are sexually active and are younger than 79 years of age. ? You are older than 79 years of age and your health care provider tells you that you are at risk for this type of infection. ? Your sexual activity has changed since you were last screened, and you are at increased risk for chlamydia or gonorrhea. Ask your health care provider if   you are at risk.  Ask your health care provider about whether you are at high risk for HIV. Your health care provider may recommend a prescription medicine to help prevent HIV infection. If you choose to take medicine to prevent HIV, you should first get tested for HIV. You should then be tested every 3 months for as long as you are taking the medicine. Pregnancy  If you are about to stop having your period (premenopausal) and  you may become pregnant, seek counseling before you get pregnant.  Take 400 to 800 micrograms (mcg) of folic acid every day if you become pregnant.  Ask for birth control (contraception) if you want to prevent pregnancy. Osteoporosis and menopause Osteoporosis is a disease in which the bones lose minerals and strength with aging. This can result in bone fractures. If you are 65 years old or older, or if you are at risk for osteoporosis and fractures, ask your health care provider if you should:  Be screened for bone loss.  Take a calcium or vitamin D supplement to lower your risk of fractures.  Be given hormone replacement therapy (HRT) to treat symptoms of menopause. Follow these instructions at home: Lifestyle  Do not use any products that contain nicotine or tobacco, such as cigarettes, e-cigarettes, and chewing tobacco. If you need help quitting, ask your health care provider.  Do not use street drugs.  Do not share needles.  Ask your health care provider for help if you need support or information about quitting drugs. Alcohol use  Do not drink alcohol if: ? Your health care provider tells you not to drink. ? You are pregnant, may be pregnant, or are planning to become pregnant.  If you drink alcohol: ? Limit how much you use to 0-1 drink a day. ? Limit intake if you are breastfeeding.  Be aware of how much alcohol is in your drink. In the [place] one drink equals one 12 oz bottle of beer (355 mL), one 5 oz glass of wine (148 mL), or one 1 oz glass of hard liquor (44 mL). General instructions  Schedule regular health, dental, and eye exams.  Stay current with your vaccines.  Tell your health care provider if: ? You often feel depressed. ? You have ever been abused or do not feel safe at home. Summary  Adopting a healthy lifestyle and getting preventive care are important in promoting health and wellness.  Follow your health care provider's instructions about healthy  diet, exercising, and getting tested or screened for diseases.  Follow your health care provider's instructions on monitoring your cholesterol and blood pressure. This information is not intended to replace advice given to you by your health care provider. Make sure you discuss any questions you have with your health care provider. Document Released: 04/09/2011 Document Revised: 09/17/2018 Document Reviewed: 09/17/2018 Elsevier Patient Education  2020 Elsevier Inc.  

## 2019-09-16 NOTE — Progress Notes (Signed)
   Subjective:   Patient ID: Jenny Madden, female    DOB: 09/27/1941, 78 y.o.   MRN: [id number]  HPI The patient is a 78 YO female coming in for physical.   PMH, [facility], social history reviewed and updated.   Review of Systems  Constitutional: Negative.   HENT: Negative.   Eyes: Negative.   Respiratory: Negative for cough, chest tightness and shortness of breath.   Cardiovascular: Negative for chest pain, palpitations and leg swelling.  Gastrointestinal: Negative for abdominal distention, abdominal pain, constipation, diarrhea, nausea and vomiting.  Musculoskeletal: Negative.   Skin: Negative.   Neurological: Negative.   Psychiatric/Behavioral: Negative.     Objective:  Physical Exam Constitutional:      Appearance: She is well-developed.  HENT:     Head: Normocephalic and atraumatic.  Neck:     Musculoskeletal: Normal range of motion.  Cardiovascular:     Rate and Rhythm: Normal rate and regular rhythm.  Pulmonary:     Effort: Pulmonary effort is normal. No respiratory distress.     Breath sounds: Normal breath sounds. No wheezing or rales.  Abdominal:     General: Bowel sounds are normal. There is no distension.     Palpations: Abdomen is soft.     Tenderness: There is no abdominal tenderness. There is no rebound.  Skin:    General: Skin is warm and dry.  Neurological:     Mental Status: She is alert and oriented to person, place, and time.     Coordination: Coordination normal.     Vitals:   09/16/19 1002  BP: 140/70  Pulse: 77  Temp: 97.7 F (36.5 C)  TempSrc: Oral  SpO2: 99%  Weight: 158 lb (71.7 kg)  Height: 5\' 3"  (1.6 m)    This visit occurred during the SARS-CoV-2 public health emergency.  Safety protocols were in place, including screening questions prior to the visit, additional usage of staff PPE, and extensive cleaning of exam room while observing appropriate contact time as indicated for disinfecting solutions.   Assessment & Plan:

## 2019-09-16 NOTE — Assessment & Plan Note (Signed)
Taking simvastatin, checking lipid panel and adjust as needed.

## 2019-09-16 NOTE — Assessment & Plan Note (Signed)
Taking pepcid and prilosec with decent control.

## 2019-09-16 NOTE — Assessment & Plan Note (Signed)
BP at goal on amlodipine 10 mg daily. Checking CMP and adjust as needed.

## 2019-09-16 NOTE — Assessment & Plan Note (Signed)
Flu shot up to date. Pneumonia complete. Shingrix counseled. Tetanus due 2021. Colonoscopy due 2023. Mammogram due 2021, pap smear aged out and dexa due 2021. Counseled about sun safety and mole surveillance. Counseled about the dangers of distracted driving. Given 10 year screening recommendations.

## 2019-09-16 NOTE — Assessment & Plan Note (Signed)
Foot exam done, microalbumin to creatinine ratio done and HgA1c. Diet controlled currently. Adjust as needed. On statin.

## 2019-09-17 ENCOUNTER — Encounter: Payer: Self-pay | Admitting: Internal Medicine

## 2019-09-18 ENCOUNTER — Other Ambulatory Visit: Payer: Self-pay | Admitting: Internal Medicine

## 2019-09-18 MED ORDER — ATORVASTATIN CALCIUM 20 MG PO TABS: 20.0000 mg | ORAL_TABLET | Freq: Every day | ORAL | 3 refills | Status: DC

## 2019-09-18 MED ORDER — SIMVASTATIN 40 MG PO TABS: 40.0000 mg | ORAL_TABLET | Freq: Every day | ORAL | 3 refills | Status: DC

## 2019-09-28 ENCOUNTER — Encounter: Payer: Self-pay | Admitting: Internal Medicine

## 2019-09-28 ENCOUNTER — Telehealth: Payer: Self-pay | Admitting: Internal Medicine

## 2019-09-28 MED ORDER — ATORVASTATIN CALCIUM 20 MG PO TABS: 20.0000 mg | ORAL_TABLET | Freq: Every day | ORAL | 0 refills | Status: DC

## 2019-09-28 MED ORDER — ATORVASTATIN CALCIUM 20 MG PO TABS: 20.0000 mg | ORAL_TABLET | Freq: Every day | ORAL | 3 refills | Status: DC

## 2019-09-28 NOTE — Telephone Encounter (Signed)
Medication: atorvastatin (LIPITOR) 20 MG tablet [id number] -Patient is calling to see if Dr. Sharlet Salina could call in a 7 day dosage. She has not received refill from the mail order.  Has the patient contacted their pharmacy? Yes  (Agent: If no, request that the patient contact the pharmacy for the refill.) (Agent: If yes, when and what did the pharmacy advise?)  Preferred Pharmacy (with phone number or street name): [id number] [address], [address]  Phone:  [phone]     Agent: Please be advised that RX refills may take up to 3 business days. We ask that you follow-up with your pharmacy.

## 2019-10-06 ENCOUNTER — Telehealth: Payer: Self-pay

## 2019-10-06 NOTE — Telephone Encounter (Signed)
Copied from [place] [id number]. Topic: General - Inquiry >> Oct 06, 2019  4:37 PM Alease Frame wrote: Reason for CRM: Patient is needing a call back from [place] as soon as possible   Call back [phone]

## 2019-10-07 NOTE — Telephone Encounter (Signed)
She should quarantine for 14 days from seeing granddaughter. If she gets any symptoms should get tested for covid-19.

## 2019-10-07 NOTE — Telephone Encounter (Signed)
Pt has been informed.

## 2019-10-07 NOTE — Telephone Encounter (Signed)
Pt stated that her granddaughters friend has tested positive for covid and she was around the granddaughter this past weekend. The granddaughter is being testing today and she called to find out her options. I informed her she could set up a testing appointment with [facility] via online web site or text and provided that information to her. She stated that she is not having any symptoms right now but wants to take precautions since she is older and stays by herself.

## 2019-10-12 ENCOUNTER — Other Ambulatory Visit: Payer: Self-pay | Admitting: Internal Medicine

## 2019-10-12 ENCOUNTER — Encounter: Payer: Self-pay | Admitting: Internal Medicine

## 2019-10-12 DIAGNOSIS — R69 Illness, unspecified: Secondary | ICD-10-CM | POA: Diagnosis not present

## 2019-10-14 ENCOUNTER — Encounter: Payer: Self-pay | Admitting: Internal Medicine

## 2019-10-16 ENCOUNTER — Encounter: Payer: Self-pay | Admitting: Internal Medicine

## 2019-11-03 ENCOUNTER — Ambulatory Visit: Payer: Medicare HMO

## 2019-11-13 ENCOUNTER — Ambulatory Visit: Payer: Medicare HMO | Attending: Internal Medicine

## 2019-11-13 DIAGNOSIS — Z23 Encounter for immunization: Secondary | ICD-10-CM | POA: Insufficient documentation

## 2019-11-13 NOTE — Progress Notes (Signed)
   Covid-19 Vaccination Clinic  Name:  Jenny Madden    MRN: [id number] DOB: 14-Dec-1940  11/13/2019  Jenny Madden was observed post Covid-19 immunization for 15 minutes without incidence. She was provided with Vaccine Information Sheet and instruction to access the V-Safe system.   Jenny Madden was instructed to call 911 with any severe reactions post vaccine: Marland Kitchen Difficulty breathing  . Swelling of your face and throat  . A fast heartbeat  . A bad rash all over your body  . Dizziness and weakness    Immunizations Administered    Name Date Dose VIS Date Route   Pfizer COVID-19 Vaccine 11/13/2019  1:03 PM 0.3 mL 09/18/2019 Intramuscular   Manufacturer: [place]   Lot: [id number]   [place]: [id number]

## 2019-11-22 DIAGNOSIS — R69 Illness, unspecified: Secondary | ICD-10-CM | POA: Diagnosis not present

## 2019-12-01 ENCOUNTER — Other Ambulatory Visit: Payer: Self-pay | Admitting: Internal Medicine

## 2019-12-03 ENCOUNTER — Telehealth: Payer: Self-pay | Admitting: Internal Medicine

## 2019-12-03 NOTE — Telephone Encounter (Signed)
New Message:   Pt states she is calling due to having trouble swallowing again and would like another referral back to see Dr. Ardis Hughs.

## 2019-12-03 NOTE — Telephone Encounter (Signed)
Pt has been informed.

## 2019-12-03 NOTE — Telephone Encounter (Signed)
Since she has seen him in the past she can just call his office to schedule an appointment.

## 2019-12-04 ENCOUNTER — Other Ambulatory Visit: Payer: Self-pay | Admitting: Internal Medicine

## 2019-12-05 ENCOUNTER — Emergency Department (HOSPITAL_COMMUNITY)
Admission: EM | Admit: 2019-12-05 | Discharge: 2019-12-05 | Disposition: A | Discharge status: Home / Self Care | Payer: Medicare HMO | Attending: Emergency Medicine | Admitting: Emergency Medicine

## 2019-12-05 ENCOUNTER — Other Ambulatory Visit: Payer: Self-pay

## 2019-12-05 ENCOUNTER — Emergency Department (HOSPITAL_COMMUNITY): Payer: Medicare HMO

## 2019-12-05 ENCOUNTER — Encounter (HOSPITAL_COMMUNITY): Payer: Self-pay | Admitting: Emergency Medicine

## 2019-12-05 DIAGNOSIS — I129 Hypertensive chronic kidney disease with stage 1 through stage 4 chronic kidney disease, or unspecified chronic kidney disease: Secondary | ICD-10-CM | POA: Diagnosis not present

## 2019-12-05 DIAGNOSIS — E119 Type 2 diabetes mellitus without complications: Secondary | ICD-10-CM | POA: Insufficient documentation

## 2019-12-05 DIAGNOSIS — N189 Chronic kidney disease, unspecified: Secondary | ICD-10-CM | POA: Diagnosis not present

## 2019-12-05 DIAGNOSIS — Z79899 Other long term (current) drug therapy: Secondary | ICD-10-CM | POA: Diagnosis not present

## 2019-12-05 DIAGNOSIS — Z87891 Personal history of nicotine dependence: Secondary | ICD-10-CM | POA: Insufficient documentation

## 2019-12-05 DIAGNOSIS — R002 Palpitations: Secondary | ICD-10-CM | POA: Diagnosis not present

## 2019-12-05 DIAGNOSIS — Z7982 Long term (current) use of aspirin: Secondary | ICD-10-CM | POA: Diagnosis not present

## 2019-12-05 DIAGNOSIS — Z85528 Personal history of other malignant neoplasm of kidney: Secondary | ICD-10-CM | POA: Insufficient documentation

## 2019-12-05 LAB — CBC
HCT: 44.5 % (ref 36.0–46.0)
Hemoglobin: 13.4 g/dL (ref 12.0–15.0)
MCH: 26.2 pg (ref 26.0–34.0)
MCHC: 30.1 g/dL (ref 30.0–36.0)
MCV: 86.9 fL (ref 80.0–100.0)
Platelets: 276 10*3/uL (ref 150–400)
RBC: 5.12 MIL/uL — ABNORMAL HIGH (ref 3.87–5.11)
RDW: 12.6 % (ref 11.5–15.5)
WBC: 6.7 10*3/uL (ref 4.0–10.5)
nRBC: 0 % (ref 0.0–0.2)

## 2019-12-05 LAB — MAGNESIUM: Magnesium: 2.1 mg/dL (ref 1.7–2.4)

## 2019-12-05 LAB — TROPONIN I (HIGH SENSITIVITY)
Troponin I (High Sensitivity): 8 ng/L (ref <18)
Troponin I (High Sensitivity): 6 ng/L (ref <18)

## 2019-12-05 LAB — BASIC METABOLIC PANEL
Anion gap: 9 (ref 5–15)
BUN: 12 mg/dL (ref 8–23)
CO2: 26 mmol/L (ref 22–32)
Calcium: 9.9 mg/dL (ref 8.9–10.3)
Chloride: 107 mmol/L (ref 98–111)
Creatinine, Ser: 1.15 mg/dL — ABNORMAL HIGH (ref 0.44–1.00)
GFR calc Af Amer: 52 mL/min — ABNORMAL LOW (ref >60)
GFR calc non Af Amer: 45 mL/min — ABNORMAL LOW (ref >60)
Glucose, Bld: 117 mg/dL — ABNORMAL HIGH (ref 70–99)
Potassium: 4.2 mmol/L (ref 3.5–5.1)
Sodium: 142 mmol/L (ref 135–145)

## 2019-12-05 LAB — PROTIME-INR
INR: 1 (ref 0.8–1.2)
Prothrombin Time: 13.4 seconds (ref 11.4–15.2)

## 2019-12-05 MED ORDER — SODIUM CHLORIDE 0.9% FLUSH: 3.0000 mL | Freq: Once | INTRAVENOUS | Status: DC

## 2019-12-05 MED ORDER — KETOROLAC TROMETHAMINE 30 MG/ML IJ SOLN: 30.0000 mg | Freq: Once | INTRAMUSCULAR | Status: DC

## 2019-12-05 NOTE — ED Notes (Signed)
Jenny Madden daughter [phone] looking for an update

## 2019-12-05 NOTE — Discharge Instructions (Signed)
Return if you are having any problems. 

## 2019-12-05 NOTE — ED Notes (Signed)
Patient verbalized understanding of dc instructions, vss, ambulatory with nad.   

## 2019-12-05 NOTE — ED Provider Notes (Signed)
[facility] EMERGENCY DEPARTMENT Provider Note   CSN: [id number] Arrival date & time: 12/05/19  0315   History Chief Complaint  Patient presents with  . Palpitations    Jenny Madden is a 79 y.o. female.  The history is provided by the patient.  Palpitations She has history of hypertension, diabetes, hyperlipidemia and comes in complaining of palpitations.  On 2 occasions tonight, she had a fluttering feeling in the epigastric area.  This lasted about 10-15 minutes before resolving.  There is no associated chest pain, heaviness, tightness, pressure.  There is no associated dyspnea, nausea, diaphoresis.  She does have acid reflux and had taken all of her medications.  She did not try anything else to help the symptoms.  She does state that she ate something that she should not have.  Non-smoker.  Past Medical History:  Diagnosis Date  . Allergic rhinitis, cause unspecified   . Anxiety state, unspecified    none recent  . Benign neoplasm of colon   . Cancer ([place])    renal mass  . Cataract   . Chronic kidney disease 01/2016   Tumor left side  . Diverticulosis of colon (without mention of hemorrhage)   . Dysphagia   . Family history of adverse reaction to anesthesia 3 yrs ago   slow to awaken   . Family history of malignant neoplasm of gastrointestinal tract   . Heart murmur   . Hypertension   . Nonspecific (abnormal) findings on radiological and other examination of other intrathoracic organs   . Nonspecific abnormal electrocardiogram (ECG) (EKG)   . Osteoporosis   . Other dysphagia   . Pure hypercholesterolemia   . Type II or unspecified type diabetes mellitus without mention of complication, not stated as uncontrolled   . Unspecified essential hypertension   . Unspecified tinnitus years ago    Patient Active Problem List   Diagnosis Date Noted  . Right shoulder pain 12/16/2018  . Left renal mass 01/18/2016  . Schatzki's ring 10/19/2015  .  Constipation 10/19/2015  . Routine general medical examination at a health care facility 07/13/2015  . Osteopenia 12/27/2011  . Vitamin D deficiency 12/27/2011  . Undiagnosed cardiac murmurs 05/12/2008  . Diabetes mellitus type 2, uncomplicated ([place]) 00/37/0488  . Hyperlipidemia associated with type 2 diabetes mellitus ([place]) 07/29/2007  . Essential hypertension 07/29/2007  . Allergic rhinitis 07/29/2007  . GERD 07/29/2007    Past Surgical History:  Procedure Laterality Date  . ABLATION Left 01/2016   Renal mass  . CESAREAN SECTION     x 1  . COLONOSCOPY    . colonscopy and endoscopy  5 yrs ago  . FOOT SURGERY     bilateral for hammer toes  . IR GENERIC HISTORICAL  12/06/2015   IR RADIOLOGIST EVAL & MGMT 12/06/2015 Aletta Edouard, MD [facility] INTERV RAD  . IR GENERIC HISTORICAL  05/03/2016   IR RADIOLOGIST EVAL & MGMT 05/03/2016 Aletta Edouard, MD [facility] INTERV RAD  . IR GENERIC HISTORICAL  02/09/2016   IR RADIOLOGIST EVAL & MGMT 02/09/2016 [facility] INTERV RAD  . IR RADIOLOGIST EVAL & MGMT  01/09/2017  . IR RADIOLOGIST EVAL & MGMT  01/14/2018  . IR RADIOLOGIST EVAL & MGMT  03/31/2019  . UPPER GASTROINTESTINAL ENDOSCOPY       OB History    Gravida  3   Para  1   Term  1   Preterm      AB  2   Living  1     SAB      TAB      Ectopic      Multiple      Live Births  1           Family History  Problem Relation Age of Onset  . Stroke Father   . Lung cancer Father   . Coronary artery disease Father   . Hypertension Mother   . Heart attack Mother   . Colon cancer Brother 85  . Esophageal cancer Neg Hx   . Rectal cancer Neg Hx   . Stomach cancer Neg Hx     Social History   Tobacco Use  . Smoking status: Former Smoker    Types: Cigarettes    Quit date: 10/09/1999    Years since quitting: 20.1  . Smokeless tobacco: Never Used  Substance Use Topics  . Alcohol use: Yes    Alcohol/week: 1.0 standard drinks    Types: 1 Standard drinks or equivalent per week     Comment: social use  . Drug use: No    Home Medications Prior to Admission medications   Medication Sig Start Date End Date Taking? Authorizing Provider  acetaminophen (TYLENOL) 325 MG tablet Take 2 tablets (650 mg total) by mouth every 6 (six) hours as needed for moderate pain. Patient not taking: Reported on 09/16/2019 12/24/18   Rodell Perna A, PA-C  amLODipine (NORVASC) 10 MG tablet TAKE 1 TABLET DAILY 12/01/19   Hoyt Koch, MD  aspirin 81 MG tablet Take 81 mg by mouth every morning.     [provider]  atorvastatin (LIPITOR) 20 MG tablet Take 1 tablet (20 mg total) by mouth daily. 09/28/19   Hoyt Koch, MD  blood glucose meter kit and supplies Dispense based on patient and insurance preference. Use up to four times daily as directed. (E11.9). 05/16/18   Hoyt Koch, MD  Blood Glucose Monitoring Suppl (ONE TOUCH ULTRA 2) w/Device KIT USE TO CHECK BLOOD SUGAR   TWO TIMES A DAY 09/01/18   Hoyt Koch, MD  Cholecalciferol (VITAMIN D) 2000 UNITS CAPS Take 2,000 Units by mouth daily.     [provider]  famotidine (PEPCID) 40 MG tablet Take 1 tablet (40 mg total) by mouth daily. 08/05/19   Hoyt Koch, MD  fexofenadine (ALLEGRA) 180 MG tablet Take 1 tablet (180 mg total) by mouth daily. Take 1 tablet by mouth once daily as needed for allergies 07/14/18   Hoyt Koch, MD  fluticasone [facility]) 50 MCG/ACT nasal spray Place 2 sprays into both nostrils daily. 09/16/19   Hoyt Koch, MD  omeprazole (PRILOSEC) 40 MG capsule TAKE 1 CAPSULE TWICE DAILY BEFORE MEALS 12/04/19   Hoyt Koch, MD  ONE TOUCH LANCETS MISC Used to check blood sugars twice daily 05/26/18   Hoyt Koch, MD  [facility] ULTRA test strip CHECK BLOOD SUGAR TWO TIMESA DAY 10/12/19   Hoyt Koch, MD  Alum & Mag Hydroxide-Simeth (MAGIC MOUTHWASH) SOLN 1 teaspoon gargle and swallow four times a day   12/27/11  [provider]  pantoprazole (PROTONIX) 40 MG tablet Take 1 tablet (40 mg total) by mouth daily. 07/01/18 07/01/18  Binnie Rail, MD    Allergies    Patient has no known allergies.  Review of Systems   Review of Systems  Cardiovascular: Positive for palpitations.  All other systems reviewed and are negative.   Physical Exam Updated Vital Signs BP Marland Kitchen)  143/65   Pulse 87   Resp 17   Ht '5\' 3"'  (1.6 m)   Wt 75 kg   SpO2 100%   BMI 29.29 kg/m   Physical Exam Vitals and nursing note reviewed.   79 year old female, resting comfortably and in no acute distress. Vital signs are significant for mildly elevated blood pressure. Oxygen saturation is 100%, which is normal. Head is normocephalic and atraumatic. PERRLA, EOMI. Oropharynx is clear. Neck is nontender and supple without adenopathy or JVD. Back is nontender and there is no CVA tenderness. Lungs are clear without rales, wheezes, or rhonchi. Chest is nontender. Heart has regular rate and rhythm without murmur. Abdomen is soft, flat, nontender without masses or hepatosplenomegaly and peristalsis is normoactive. Extremities have no cyanosis or edema, full range of motion is present. Skin is warm and dry without rash. Neurologic: Mental status is normal, cranial nerves are intact, there are no motor or sensory deficits.  ED Results / Procedures / Treatments   Labs (all labs ordered are listed, but only abnormal results are displayed) Labs Reviewed  BASIC METABOLIC PANEL - Abnormal; Notable for the following components:      Result Value   Glucose, Bld 117 (*)    Creatinine, Ser 1.15 (*)    GFR calc non Af Amer 45 (*)    GFR calc Af Amer 52 (*)    All other components within normal limits  CBC - Abnormal; Notable for the following components:   RBC 5.12 (*)    All other components within normal limits  PROTIME-INR  MAGNESIUM  TROPONIN I (HIGH SENSITIVITY)  TROPONIN I (HIGH SENSITIVITY)    EKG EKG Interpretation  Date/Time:   Saturday December 05 2019 03:20:26 EST Ventricular Rate:  101 PR Interval:  184 QRS Duration: 84 QT Interval:  338 QTC Calculation: 438 R Axis:   68 Text Interpretation: Sinus tachycardia Anterior infarct , age undetermined Abnormal ECG When compared with ECG of 12/24/2018, No significant change was found Confirmed by Delora Fuel ([id number]) on 12/05/2019 4:02:00 AM   Radiology DG Chest 2 View  Result Date: 12/05/2019 CLINICAL DATA:  Palpitations EXAM: CHEST - 2 VIEW COMPARISON:  March 25, 2019 FINDINGS: The heart size and mediastinal contours are within normal limits. Tortuous descending aorta. Aortic knob calcifications. Both lungs are clear. S-shaped scoliotic curvature of the thoracolumbar spine. IMPRESSION: No active cardiopulmonary disease. Electronically Signed   By: Prudencio Pair M.D.   On: 12/05/2019 03:58    Procedures Procedures  Medications Ordered in ED Medications  sodium chloride flush (NS) 0.9 % injection 3 mL (3 mLs Intravenous Not Given 12/05/19 0406)    ED Course  I have reviewed the triage vital signs and the nursing notes.  Pertinent labs & imaging results that were available during my care of the patient were reviewed by me and considered in my medical decision making (see chart for details).  MDM Rules/Calculators/A&P Fluttering in upper abdomen of uncertain cause.  It is not clear whether this is GI or cardiac.  ECG showed sinus tachycardia but otherwise unremarkable.  Electrolytes are normal and CBC is normal.  Initial troponin is 8.  Will check delta troponin and magnesium.  Old records are reviewed, and she has no relevant past visits.  Magnesium and delta troponin are normal.  She is discharged with instructions to follow-up with her PCP and referred to cardiology for possible heart monitoring.  Final Clinical Impression(s) / ED Diagnoses Final diagnoses:  Palpitations  Rx / DC Orders ED Discharge Orders    None       Delora Fuel, MD 27/78/24 [phone]

## 2019-12-05 NOTE — ED Triage Notes (Signed)
Patient reports palpitations onset last night , denies SOB , no chest pain or fever .

## 2019-12-07 ENCOUNTER — Ambulatory Visit (INDEPENDENT_AMBULATORY_CARE_PROVIDER_SITE_OTHER): Payer: Medicare HMO | Admitting: Internal Medicine

## 2019-12-07 ENCOUNTER — Encounter: Payer: Self-pay | Admitting: Internal Medicine

## 2019-12-07 ENCOUNTER — Other Ambulatory Visit: Payer: Self-pay

## 2019-12-07 VITALS — BP 142/79 | HR 92 | Temp 98.8°F | Ht 63.0 in | Wt 154.4 lb

## 2019-12-07 DIAGNOSIS — K219 Gastro-esophageal reflux disease without esophagitis: Secondary | ICD-10-CM | POA: Diagnosis not present

## 2019-12-07 DIAGNOSIS — R002 Palpitations: Secondary | ICD-10-CM

## 2019-12-07 LAB — TSH: TSH: 1.84 u[IU]/mL (ref 0.35–4.50)

## 2019-12-07 LAB — T4, FREE: Free T4: 0.85 ng/dL (ref 0.60–1.60)

## 2019-12-07 MED ORDER — SUCRALFATE 1 G PO TABS: 1.0000 g | ORAL_TABLET | Freq: Three times a day (TID) | ORAL | 0 refills | Status: DC

## 2019-12-07 NOTE — Progress Notes (Signed)
   Subjective:   Patient ID: Jenny Madden, female    DOB: 1941-03-31, 79 y.o.   MRN: [id number]  HPI The patient is a 79 YO female coming in for ER follow up (in for palpitations in the esophagus, EKG done without changes, troponin negative for ACS). She is still having those today and daily. She is scared to eat as every time she eats it causes those palpitations. She denies food getting stuck per say. She does take omeprazole BID. Sometimes takes pepcid too but not regularly. This is not helping overall. Denies worsening pain in esophagus with change in position. She has visit with GI in about 1 week to discuss with them. Weight is down about 5-6 pounds with fear of eating in the last 6 months.  PMH, [facility], social history reviewed and updated  Review of Systems  Constitutional: Negative.   HENT: Negative.   Eyes: Negative.   Respiratory: Negative for cough, chest tightness and shortness of breath.   Cardiovascular: Positive for palpitations. Negative for chest pain and leg swelling.  Gastrointestinal: Positive for abdominal distention and abdominal pain. Negative for anal bleeding, blood in stool, constipation, diarrhea, nausea and vomiting.  Musculoskeletal: Negative.   Skin: Negative.   Neurological: Negative.   Psychiatric/Behavioral: Negative.     Objective:  Physical Exam Constitutional:      Appearance: She is well-developed.  HENT:     Head: Normocephalic and atraumatic.  Cardiovascular:     Rate and Rhythm: Normal rate and regular rhythm.     Heart sounds: Murmur present.     Comments: HR fast but not tachycardia, stable murmur Pulmonary:     Effort: Pulmonary effort is normal. No respiratory distress.     Breath sounds: Normal breath sounds. No wheezing or rales.  Abdominal:     General: Bowel sounds are normal. There is no distension.     Palpations: Abdomen is soft.     Tenderness: There is no abdominal tenderness. There is no rebound.     Comments: Pain when  palpating epigastric region  Musculoskeletal:     Cervical back: Normal range of motion.  Skin:    General: Skin is warm and dry.  Neurological:     Mental Status: She is alert and oriented to person, place, and time.     Coordination: Coordination normal.     Vitals:   12/07/19 1457  BP: (!) 142/79  Pulse: 92  Temp: 98.8 F (37.1 C)  TempSrc: Oral  SpO2: 97%  Weight: 154 lb 6.4 oz (70 kg)  Height: 5\' 3"  (1.6 m)    This visit occurred during the SARS-CoV-2 public health emergency.  Safety protocols were in place, including screening questions prior to the visit, additional usage of staff PPE, and extensive cleaning of exam room while observing appropriate contact time as indicated for disinfecting solutions.   Assessment & Plan:  Visit time 20 minutes in face to face communication with patient and coordination of care, additional 10 minutes spent in record review, coordination or care, ordering tests, communicating/referring to other healthcare professionals, documenting in medical records all on the same day of the visit for total time 30 minutes spent on the visit.

## 2019-12-07 NOTE — Patient Instructions (Signed)
We are checking the thyroid levels today.  We have sent in sulcralfate to take with meals and at bedtime in addition to the pepcid and the omeprazole to see if this can help with the sensation in your chest.

## 2019-12-08 ENCOUNTER — Ambulatory Visit: Payer: Medicare HMO | Attending: Internal Medicine

## 2019-12-08 DIAGNOSIS — Z23 Encounter for immunization: Secondary | ICD-10-CM | POA: Insufficient documentation

## 2019-12-08 DIAGNOSIS — R002 Palpitations: Secondary | ICD-10-CM | POA: Insufficient documentation

## 2019-12-08 NOTE — Assessment & Plan Note (Signed)
Checking TSH and free T4 to rule out thyroid cause of sensation of palpitations. If GI does not feel that GERD is causing will order monitor.

## 2019-12-08 NOTE — Progress Notes (Signed)
   Covid-19 Vaccination Clinic  Name:  ZYVA ZINS    MRN: [id number] DOB: 13-Nov-1940  12/08/2019  Ms. Caccia was observed post Covid-19 immunization for 15 minutes without incident. She was provided with Vaccine Information Sheet and instruction to access the V-Safe system.   Ms. Perlman was instructed to call 911 with any severe reactions post vaccine: Marland Kitchen Difficulty breathing  . Swelling of face and throat  . A fast heartbeat  . A bad rash all over body  . Dizziness and weakness   Immunizations Administered    Name Date Dose VIS Date Route   Pfizer COVID-19 Vaccine 12/08/2019  1:30 PM 0.3 mL 09/18/2019 Intramuscular   Manufacturer: [place]   Lot: [id number]   [place]: [id number]

## 2019-12-08 NOTE — Assessment & Plan Note (Signed)
I suspect that this is the cause of the fluttering in the esophagus rather than cardiac cause. Will add sucralfate to her omeprazole and encouraged to take pepcid nightly until visit with GI next week. May need EGD as she has had prior dilation in the past some years ago.

## 2019-12-09 ENCOUNTER — Other Ambulatory Visit: Payer: Self-pay

## 2019-12-09 ENCOUNTER — Encounter: Payer: Self-pay | Admitting: Internal Medicine

## 2019-12-13 NOTE — Progress Notes (Signed)
12/13/2019 GLENORA MOROCHO [id number] [birth year]   Chief Complaint: Difficulty swallowing  History of Present Illness: Jonesha Tsuchiya. Haynie is a 79 year old female with a past medical history of anxiety, hypertension, DM II diet controlled, CKD, left renal mass s/p percutaneous cryoablation 2017 (most likely renal cell carcinoma, due to the relatively small size the neoplasm at the time of his treatment a biopsy could not be performed at the time of cryoablation by Dr. Kathlene Cote), dysphagia, Schatzki's ring and colon polyps.  She presents today for further evaluation regarding dysphagia.  She reports having food getting stuck to the lower esophagus with associated palpitation type sensation to the epigastric area and nausea which occurs daily when eating for the past month.  She stated these are similar symptoms to what she experienced in the past prior to having her esophagus dilated.  She is afraid to eat due to the symptoms.  She has lost 2 pounds over the past 2 weeks.  She has heartburn only if she eats citrus or acidic type foods.  She avoids spicy foods.  Her most recent EGD was 01/10/2018, a Schatzki's ring was dilated to 20 mm and chronic gastritis was identified.  She is passing normal formed brown bowel movement daily.  No rectal bleeding or melena.  Her most recent colonoscopy was 11/07/2016 and 2 tubular adenomatous polyps were removed from the descending and ascending colon.  She is due for repeat colonoscopy January 2023.  Brother with history of colon cancer.  She presented to [facility] ED on 12/[facility]/2019 with a fluttering sensation to her epigastric area. No associated chest pain or SOB. A 12 lead EKG showed sinus tachycardia with poor R wave progression V1 -V3 concerning for past septal infarct, no acute ischemia. (comparison EKG 12/2018 showed low voltage R waves in V1 and V2 with non specific ST Twave abnormality in V5 and V6).  Troponin 8.  Abdominal imaging was not done.  She  was seen by her PCP Dr. Sharlet Salina on 12/07/2019 who assessed the fluttering to her epigastric area was most likely GI and not cardiac.  Sucralfate 1 gm po qid was added to her routine Omeprazole 40m bid  and she was advised to take Pepcid 490mat bedtime.   She has a history of a left kidney mass which required percutaneous cryoablation by IR in 2017.  The kidney mass was presumed to be malignant, as noted above, biopsies could not be obtained during her cryoablation due to the small size of the mass.  Since that time, she has undergone a surveillance abdominal/pelvic CT annually.  Her most recent abdominal/pelvic CT was June 2020 which was stable, no evidence of tumor recurrence.  See results below.  Abdominal/Pelvic CT 03/19/2019: 1. Stable ablation defect in the posterior interpolar left kidney, with no evidence of local tumor recurrence. 2. No findings of metastatic disease in the abdomen. 3. Chronic findings include: Aortic Atherosclerosis (ICD10-I70.0). Coronary atherosclerosis. Moderate colonic diverticulosis.   EGD 01/10/2018 EGD by Dr. JaArdis Hughs- Thin Schatzkis' ring, dilated to 2028m- Mild gastritis, biopsies showed chronic gastritis.  - The examination was otherwise normal.  Colonoscopy 11/07/2016 by Dr. JacArdis Hughs Two 5 to 8 mm tubular adenomatous polyps in the descending colon and in the ascending colon, removed with a cold snare. Resected and retrieved. - Diverticulosis in the left colon. - The examination was otherwise normal on direct and retroflexion views. - 5 year recall.     Current Outpatient Medications  on File Prior to Visit  Medication Sig Dispense Refill  . acetaminophen (TYLENOL) 325 MG tablet Take 2 tablets (650 mg total) by mouth every 6 (six) hours as needed for moderate pain. 30 tablet 0  . amLODipine (NORVASC) 10 MG tablet TAKE 1 TABLET DAILY 90 tablet 1  . aspirin 81 MG tablet Take 81 mg by mouth every morning.     Marland Kitchen atorvastatin (LIPITOR) 20 MG tablet Take 1  tablet (20 mg total) by mouth daily. 30 tablet 0  . blood glucose meter kit and supplies Dispense based on patient and insurance preference. Use up to four times daily as directed. (E11.9). 1 each 0  . Blood Glucose Monitoring Suppl (ONE TOUCH ULTRA 2) w/Device KIT USE TO CHECK BLOOD SUGAR   TWO TIMES A DAY 1 each 0  . Cholecalciferol (VITAMIN D) 2000 UNITS CAPS Take 2,000 Units by mouth daily.     Marland Kitchen erythromycin ophthalmic ointment Place 1 application into the right eye at bedtime.    . fexofenadine (ALLEGRA) 180 MG tablet Take 1 tablet (180 mg total) by mouth daily. Take 1 tablet by mouth once daily as needed for allergies 90 tablet 3  . fluticasone (FLONASE) 50 MCG/ACT nasal spray Place 2 sprays into both nostrils daily. 48 g 3  . ONE TOUCH LANCETS MISC Used to check blood sugars twice daily 200 each 0  . ONETOUCH ULTRA test strip CHECK BLOOD SUGAR TWO TIMESA DAY 100 strip 2  . sucralfate (CARAFATE) 1 g tablet Take 1 tablet (1 g total) by mouth 4 (four) times daily -  with meals and at bedtime. 120 tablet 0  . famotidine (PEPCID) 40 MG tablet Take 1 tablet (40 mg total) by mouth daily. (Patient not taking: Reported on 12/14/2019) 90 tablet 3  . omeprazole (PRILOSEC) 40 MG capsule TAKE 1 CAPSULE TWICE DAILY BEFORE MEALS (Patient not taking: Reported on 12/14/2019) 180 capsule 1  . [DISCONTINUED] Alum & Mag Hydroxide-Simeth (MAGIC MOUTHWASH) SOLN 1 teaspoon gargle and swallow four times a day     . [DISCONTINUED] pantoprazole (PROTONIX) 40 MG tablet Take 1 tablet (40 mg total) by mouth daily. 90 tablet 1   No current facility-administered medications on file prior to visit.  No Known Allergies   Current Medications, Allergies, Past Medical History, Past Surgical History, Family History and Social History were reviewed in [employer] record.   Physical Exam: BP (!) 110/58 (BP Location: Left Arm, Patient Position: Sitting, Cuff Size: Normal)   Pulse 76   Temp 98.7 F (37.1  C)   Ht '5\' 3"'  (1.6 m)   Wt 154 lb (69.9 kg)   BMI 27.28 kg/m  General: Well developed  79 year old female in no acute distress. Head: Normocephalic and atraumatic. Eyes:  No scleral icterus. Conjunctiva pink . Ears: Normal auditory acuity. Lungs: Clear throughout to auscultation. Heart: RRR, 2/6 systolic murmur.  Abdomen: Soft, nontender and nondistended. No masses or hepatomegaly. Normal bowel sounds x 4 quadrants.  Rectal: Deferred.  Musculoskeletal: Symmetrical with no gross deformities. Extremities: No edema. Neurological: Alert oriented x 4. No focal deficits.  Psychological:  Alert and cooperative. Normal mood and affect  Assessment and Recommendations:  62.  79 year old female with a history of GERD and Schatzki's ring of presents with recurrence of dysphagia and epigastric fluttering -EGD benefits and risks discussed including risk with sedation, risk of bleeding, perforation and infection  -Continue omeprazole 40 mg twice daily and famotidine at bedtime for now.  Reduced  sucralfate 1 g p.o. nightly. -If EGD is normal I would recommend scheduling an abdominal/pelvic CT with contrast and not waiting till her surveillance date of June 2021.  2. History of colon polyps.  Family history of colon cancer.  Next colonoscopy due January 2023.  3.  History of left renal mass, most likely renal cell carcinoma  4.  History of diabetes mellitus type 2  5.  History of hypertension, hyper cholesterolemia, heart murmur  -consider cardiac evaluation if EGD negative

## 2019-12-14 ENCOUNTER — Encounter: Payer: Self-pay | Admitting: Nurse Practitioner

## 2019-12-14 ENCOUNTER — Ambulatory Visit: Payer: Medicare HMO | Admitting: Nurse Practitioner

## 2019-12-14 VITALS — BP 110/58 | HR 76 | Temp 98.7°F | Ht 63.0 in | Wt 154.0 lb

## 2019-12-14 DIAGNOSIS — R1310 Dysphagia, unspecified: Secondary | ICD-10-CM | POA: Diagnosis not present

## 2019-12-14 DIAGNOSIS — Z01818 Encounter for other preprocedural examination: Secondary | ICD-10-CM

## 2019-12-14 DIAGNOSIS — K222 Esophageal obstruction: Secondary | ICD-10-CM | POA: Diagnosis not present

## 2019-12-14 NOTE — Patient Instructions (Signed)
If you are age 80 or older, your body mass index should be between 23-30. Your Body mass index is 27.28 kg/m. If this is out of the aforementioned range listed, please consider follow up with your Primary Care Provider.  If you are age 28 or younger, your body mass index should be between 19-25. Your Body mass index is 27.28 kg/m. If this is out of the aformentioned range listed, please consider follow up with your Primary Care Provider.   Restart your Omeprazole 40 mg take 1 by mouth twice daily 30 minustes before breakfast and dinner.  Restart Famotadine 40 mg 1 by mouth daily   Reduce sucralfate to 1 gram at bedtime  Due to recent changes in healthcare laws, you may see the results of your imaging and laboratory studies on MyChart before your provider has had a chance to review them.  We understand that in some cases there may be results that are confusing or concerning to you. Not all laboratory results come back in the same time frame and the provider may be waiting for multiple results in order to interpret others.  Please give [place] 48 hours in order for your provider to thoroughly review all the results before contacting the office for clarification of your results.   Thank you for choosing [facility] Noralyn Pick, CRNP

## 2019-12-15 NOTE — Progress Notes (Signed)
I agree with the above note, plan 

## 2019-12-17 ENCOUNTER — Encounter: Payer: Self-pay | Admitting: Gastroenterology

## 2019-12-17 ENCOUNTER — Ambulatory Visit (INDEPENDENT_AMBULATORY_CARE_PROVIDER_SITE_OTHER): Payer: Medicare HMO

## 2019-12-17 DIAGNOSIS — Z1159 Encounter for screening for other viral diseases: Secondary | ICD-10-CM

## 2019-12-18 LAB — SARS CORONAVIRUS 2 (TAT 6-24 HRS): SARS Coronavirus 2: NEGATIVE

## 2019-12-21 ENCOUNTER — Other Ambulatory Visit: Payer: Self-pay

## 2019-12-21 ENCOUNTER — Encounter: Payer: Self-pay | Admitting: Gastroenterology

## 2019-12-21 ENCOUNTER — Ambulatory Visit (AMBULATORY_SURGERY_CENTER): Payer: Medicare HMO | Admitting: Gastroenterology

## 2019-12-21 VITALS — BP 112/55 | HR 60 | Temp 96.6°F | Resp 14 | Ht 63.0 in | Wt 154.0 lb

## 2019-12-21 DIAGNOSIS — R1310 Dysphagia, unspecified: Secondary | ICD-10-CM | POA: Diagnosis not present

## 2019-12-21 DIAGNOSIS — K222 Esophageal obstruction: Secondary | ICD-10-CM | POA: Diagnosis not present

## 2019-12-21 DIAGNOSIS — E119 Type 2 diabetes mellitus without complications: Secondary | ICD-10-CM | POA: Diagnosis not present

## 2019-12-21 DIAGNOSIS — I10 Essential (primary) hypertension: Secondary | ICD-10-CM | POA: Diagnosis not present

## 2019-12-21 DIAGNOSIS — K219 Gastro-esophageal reflux disease without esophagitis: Secondary | ICD-10-CM | POA: Diagnosis not present

## 2019-12-21 MED ORDER — SODIUM CHLORIDE 0.9 % IV SOLN: 500.0000 mL | Freq: Once | INTRAVENOUS | Status: DC

## 2019-12-21 NOTE — Progress Notes (Signed)
Called to room to assist during endoscopic procedure.  Patient ID and intended procedure confirmed with present staff. Received instructions for my participation in the procedure from the performing physician.  

## 2019-12-21 NOTE — Patient Instructions (Signed)
YOU HAD AN ENDOSCOPIC PROCEDURE TODAY AT THE [facility]:   Refer to the procedure report that was given to you for any specific questions about what was found during the examination.  If the procedure report does not answer your questions, please call your gastroenterologist to clarify.  If you requested that your care partner not be given the details of your procedure findings, then the procedure report has been included in a sealed envelope for you to review at your convenience later.  YOU SHOULD EXPECT: Some feelings of bloating in the abdomen. Passage of more gas than usual.  Walking can help get rid of the air that was put into your GI tract during the procedure and reduce the bloating. If you had a lower endoscopy (such as a colonoscopy or flexible sigmoidoscopy) you may notice spotting of blood in your stool or on the toilet paper. If you underwent a bowel prep for your procedure, you may not have a normal bowel movement for a few days.  Please Note:  You might notice some irritation and congestion in your nose or some drainage.  This is from the oxygen used during your procedure.  There is no need for concern and it should clear up in a day or so.  SYMPTOMS TO REPORT IMMEDIATELY:    Following upper endoscopy (EGD)  Vomiting of blood or coffee ground material  New chest pain or pain under the shoulder blades  Painful or persistently difficult swallowing  New shortness of breath  Fever of 100F or higher  Black, tarry-looking stools  For urgent or emergent issues, a gastroenterologist can be reached at any hour by calling [phone]. Do not use MyChart messaging for urgent concerns.    DIET:  We do recommend a small meal at first, but then you may proceed to your regular diet.  Drink plenty of fluids but you should avoid alcoholic beverages for 24 hours.  ACTIVITY:  You should plan to take it easy for the rest of today and you should NOT DRIVE or use heavy machinery  until tomorrow (because of the sedation medicines used during the test).    FOLLOW UP: Our staff will call the number listed on your records 48-72 hours following your procedure to check on you and address any questions or concerns that you may have regarding the information given to you following your procedure. If we do not reach you, we will leave a message.  We will attempt to reach you two times.  During this call, we will ask if you have developed any symptoms of COVID 19. If you develop any symptoms (ie: fever, flu-like symptoms, shortness of breath, cough etc.) before then, please call [phone].  If you test positive for Covid 19 in the 2 weeks post procedure, please call and report this information to us.    If any biopsies were taken you will be contacted by phone or by letter within the next 1-3 weeks.  Please call us at [phone] if you have not heard about the biopsies in 3 weeks.    SIGNATURES/CONFIDENTIALITY: You and/or your care partner have signed paperwork which will be entered into your electronic medical record.  These signatures attest to the fact that that the information above on your After Visit Summary has been reviewed and is understood.  Full responsibility of the confidentiality of this discharge information lies with you and/or your care-partner. 

## 2019-12-21 NOTE — Op Note (Signed)
[facility] Patient Name: Jenny Madden Procedure Date: 12/21/2019 9:05 AM MRN: [id number] Endoscopist: Milus Banister , MD Age: 79 Referring MD:  Date of Birth: Oct 03, 1941 Gender: Female Account #: [id number] Procedure:                Upper GI endoscopy Indications:              Dysphagia; h/o Schatzki's ring. 2013 EGD dilation                            to 80mm; 2019 EGD dilation to 55mm with symptomatic                            improvement for almost 2 years. Medicines:                Monitored Anesthesia Care Procedure:                Pre-Anesthesia Assessment:                           - Prior to the procedure, a History and Physical                            was performed, and patient medications and                            allergies were reviewed. The patient's tolerance of                            previous anesthesia was also reviewed. The risks                            and benefits of the procedure and the sedation                            options and risks were discussed with the patient.                            All questions were answered, and informed consent                            was obtained. Prior Anticoagulants: The patient has                            taken no previous anticoagulant or antiplatelet                            agents. ASA Grade Assessment: II - A patient with                            mild systemic disease. After reviewing the risks                            and benefits, the patient was deemed in  satisfactory condition to undergo the procedure.                           After obtaining informed consent, the endoscope was                            passed under direct vision. Throughout the                            procedure, the patient's blood pressure, pulse, and                            oxygen saturations were monitored continuously. The                            Endoscope was  introduced through the mouth, and                            advanced to the second part of duodenum. The upper                            GI endoscopy was accomplished without difficulty.                            The patient tolerated the procedure well. Scope In: Scope Out: Findings:                 One benign-appearing, intrinsic mild stenosis                            (minor Schatzki's ringh) was found at the                            gastroesophageal junction. A TTS dilator was passed                            through the scope. Dilation with an 18-19-20 mm                            balloon dilator was performed to 20 mm for 1 minute.                           The exam was otherwise without abnormality. Complications:            No immediate complications. Estimated blood loss:                            None. Estimated Blood Loss:     Estimated blood loss: none. Impression:               - Minor Schatzki's ring, dilated to 42mm.                           - The examination was otherwise normal. Recommendation:           - Patient has a  contact number available for                            emergencies. The signs and symptoms of potential                            delayed complications were discussed with the                            patient. Return to normal activities tomorrow.                            Written discharge instructions were provided to the                            patient.                           - Resume previous diet. Chew your food well, eat                            slowly and take small bites.                           - Continue present medications. Milus Banister, MD 12/21/2019 9:22:13 AM This report has been signed electronically.

## 2019-12-21 NOTE — Progress Notes (Signed)
Pt's states no medical or surgical changes since previsit or office visit.  LC - temp DT - vitals 

## 2019-12-22 ENCOUNTER — Telehealth: Payer: Self-pay | Admitting: *Deleted

## 2019-12-22 DIAGNOSIS — R69 Illness, unspecified: Secondary | ICD-10-CM | POA: Diagnosis not present

## 2019-12-22 NOTE — Telephone Encounter (Signed)
  Follow up Call-  Call back number 12/21/2019 01/10/2018  Post procedure Call Back phone  # [phone]  Permission to leave phone message Yes Yes  Some recent data might be hidden     Patient questions:  Do you have a fever, pain , or abdominal swelling? No. Pain Score  0 *  Have you tolerated food without any problems? Yes.    Have you been able to return to your normal activities? Yes.    Do you have any questions about your discharge instructions: Diet   No. Medications  No. Follow up visit  No.  Do you have questions or concerns about your Care? No.  Actions: * If pain score is 4 or above: 1. No action needed, pain <4.Have you developed a fever since your procedure? no  2.   Have you had an respiratory symptoms (SOB or cough) since your procedure? no  3.   Have you tested positive for COVID 19 since your procedure no  4.   Have you had any family members/close contacts diagnosed with the COVID 19 since your procedure?  no   If yes to any of these questions please route to Joylene John, RN and Alphonsa Gin, Therapist, sports.

## 2019-12-29 ENCOUNTER — Emergency Department (HOSPITAL_COMMUNITY)
Admission: EM | Admit: 2019-12-29 | Discharge: 2019-12-30 | Disposition: A | Discharge status: Home / Self Care | Payer: Medicare HMO | Attending: Emergency Medicine | Admitting: Emergency Medicine

## 2019-12-29 ENCOUNTER — Encounter (HOSPITAL_COMMUNITY): Payer: Self-pay | Admitting: Emergency Medicine

## 2019-12-29 ENCOUNTER — Telehealth: Payer: Self-pay | Admitting: Gastroenterology

## 2019-12-29 ENCOUNTER — Other Ambulatory Visit: Payer: Self-pay

## 2019-12-29 DIAGNOSIS — R109 Unspecified abdominal pain: Secondary | ICD-10-CM | POA: Insufficient documentation

## 2019-12-29 DIAGNOSIS — R110 Nausea: Secondary | ICD-10-CM | POA: Insufficient documentation

## 2019-12-29 DIAGNOSIS — E119 Type 2 diabetes mellitus without complications: Secondary | ICD-10-CM | POA: Insufficient documentation

## 2019-12-29 DIAGNOSIS — Z87891 Personal history of nicotine dependence: Secondary | ICD-10-CM | POA: Diagnosis not present

## 2019-12-29 DIAGNOSIS — Z7982 Long term (current) use of aspirin: Secondary | ICD-10-CM | POA: Diagnosis not present

## 2019-12-29 DIAGNOSIS — I10 Essential (primary) hypertension: Secondary | ICD-10-CM | POA: Insufficient documentation

## 2019-12-29 DIAGNOSIS — Z79899 Other long term (current) drug therapy: Secondary | ICD-10-CM | POA: Diagnosis not present

## 2019-12-29 DIAGNOSIS — R1013 Epigastric pain: Secondary | ICD-10-CM | POA: Diagnosis not present

## 2019-12-29 LAB — CBC
HCT: 43.2 % (ref 36.0–46.0)
Hemoglobin: 13.1 g/dL (ref 12.0–15.0)
MCH: 26.4 pg (ref 26.0–34.0)
MCHC: 30.3 g/dL (ref 30.0–36.0)
MCV: 86.9 fL (ref 80.0–100.0)
Platelets: 283 10*3/uL (ref 150–400)
RBC: 4.97 MIL/uL (ref 3.87–5.11)
RDW: 12.5 % (ref 11.5–15.5)
WBC: 7.3 10*3/uL (ref 4.0–10.5)
nRBC: 0 % (ref 0.0–0.2)

## 2019-12-29 LAB — URINALYSIS, ROUTINE W REFLEX MICROSCOPIC
Bilirubin Urine: NEGATIVE
Glucose, UA: NEGATIVE mg/dL
Hgb urine dipstick: NEGATIVE
Ketones, ur: NEGATIVE mg/dL
Leukocytes,Ua: NEGATIVE
Nitrite: NEGATIVE
Protein, ur: NEGATIVE mg/dL
Specific Gravity, Urine: 1.004 — ABNORMAL LOW (ref 1.005–1.030)
pH: 7 (ref 5.0–8.0)

## 2019-12-29 MED ORDER — SODIUM CHLORIDE 0.9% FLUSH: 3.0000 mL | Freq: Once | INTRAVENOUS | Status: DC

## 2019-12-29 NOTE — ED Triage Notes (Signed)
Patient reports epigastric pain /discomfort with nausea onset this week , denies emesis or diarrhea , no fever or chills .

## 2019-12-29 NOTE — Telephone Encounter (Signed)
Pt states she had an EGD done 3/15. Reports she is still "feeling sick under her breast." Pt states she has nausea in the morning, she is afraid to eat and has no appetite. She has not actually vomited. Pt wants to know what Dr. Ardis Hughs recommends. Please advise.

## 2019-12-30 ENCOUNTER — Other Ambulatory Visit: Payer: Self-pay | Admitting: Internal Medicine

## 2019-12-30 ENCOUNTER — Emergency Department (HOSPITAL_COMMUNITY): Payer: Medicare HMO

## 2019-12-30 DIAGNOSIS — R1013 Epigastric pain: Secondary | ICD-10-CM | POA: Diagnosis not present

## 2019-12-30 DIAGNOSIS — R110 Nausea: Secondary | ICD-10-CM | POA: Diagnosis not present

## 2019-12-30 DIAGNOSIS — I10 Essential (primary) hypertension: Secondary | ICD-10-CM | POA: Diagnosis not present

## 2019-12-30 LAB — COMPREHENSIVE METABOLIC PANEL
ALT: 14 U/L (ref 0–44)
AST: 22 U/L (ref 15–41)
Albumin: 4.2 g/dL (ref 3.5–5.0)
Alkaline Phosphatase: 80 U/L (ref 38–126)
Anion gap: 10 (ref 5–15)
BUN: 11 mg/dL (ref 8–23)
CO2: 24 mmol/L (ref 22–32)
Calcium: 9.9 mg/dL (ref 8.9–10.3)
Chloride: 107 mmol/L (ref 98–111)
Creatinine, Ser: 1.12 mg/dL — ABNORMAL HIGH (ref 0.44–1.00)
GFR calc Af Amer: 54 mL/min — ABNORMAL LOW (ref >60)
GFR calc non Af Amer: 47 mL/min — ABNORMAL LOW (ref >60)
Glucose, Bld: 107 mg/dL — ABNORMAL HIGH (ref 70–99)
Potassium: 3.7 mmol/L (ref 3.5–5.1)
Sodium: 141 mmol/L (ref 135–145)
Total Bilirubin: 0.5 mg/dL (ref 0.3–1.2)
Total Protein: 7.8 g/dL (ref 6.5–8.1)

## 2019-12-30 LAB — LIPASE, BLOOD: Lipase: 92 U/L — ABNORMAL HIGH (ref 11–51)

## 2019-12-30 MED ORDER — ONDANSETRON 4 MG PO TBDP: 4.0000 mg | ORAL_TABLET | Freq: Three times a day (TID) | ORAL | 0 refills | Status: DC | PRN

## 2019-12-30 NOTE — Discharge Instructions (Addendum)
You were seen today for ongoing nausea and abdominal pain.  Your work-up is reassuring.  Follow-up closely with Dr. Ardis Hughs.  Take Zofran as needed for nausea and continue your omeprazole.

## 2019-12-30 NOTE — ED Provider Notes (Signed)
[facility] Provider Note   CSN: [id number] Arrival date & time: 12/29/19  2252     History Chief Complaint  Patient presents with  . Abdominal Pain    Jenny Madden is a 79 y.o. female.  HPI     This is a 79 year old female with history of chronic kidney disease, dysphagia secondary to Schatzki's ring, diabetes who presents with abdominal pain and nausea.  Patient reports that she had endoscopy last Monday on March 15 by Dr. Ardis Hughs.  She had the endoscopy for ongoing epigastric discomfort and nausea.  She states that she has had persistent nausea intermittently since that time.  Her symptoms did not improve.  She states that at times she experiences anorexia.  She has tolerated both solid and liquid foods.  She is not noted any emesis or diarrhea.  No fever or chills.  Patient reports that tonight she had nausea and an unsettled feeling in her upper abdomen when she went to lay down.  This concerned her.  She did not take anything for her symptoms.  She takes omeprazole at home on a daily basis.  By the time she got to the emergency room her symptoms had completely resolved.  She did not have any chest pain or shortness of breath.  Patient reports that she spoke with the nurse at Dr. Ardis Hughs office but did not receive a call back.  Chart reviewed.  Endoscopy with findings of Schatzki ring and dilation.  Past Medical History:  Diagnosis Date  . Allergic rhinitis, cause unspecified   . Anxiety state, unspecified    none recent  . Benign neoplasm of colon   . Cancer ([place])    renal mass  . Cataract   . Chronic kidney disease 01/2016   Tumor left side  . Diverticulosis of colon (without mention of hemorrhage)   . Dysphagia   . Family history of adverse reaction to anesthesia 3 yrs ago   slow to awaken   . Family history of malignant neoplasm of gastrointestinal tract   . Heart murmur   . Hypertension   . Nonspecific (abnormal) findings  on radiological and other examination of other intrathoracic organs   . Nonspecific abnormal electrocardiogram (ECG) (EKG)   . Osteoporosis   . Other dysphagia   . Pure hypercholesterolemia   . Type II or unspecified type diabetes mellitus without mention of complication, not stated as uncontrolled   . Unspecified essential hypertension   . Unspecified tinnitus years ago    Patient Active Problem List   Diagnosis Date Noted  . Palpitations 12/08/2019  . Right shoulder pain 12/16/2018  . Left renal mass 01/18/2016  . Schatzki's ring 10/19/2015  . Constipation 10/19/2015  . Routine general medical examination at a health care facility 07/13/2015  . Osteopenia 12/27/2011  . Vitamin D deficiency 12/27/2011  . Undiagnosed cardiac murmurs 05/12/2008  . Diabetes mellitus type 2, uncomplicated ([place]) 78/67/5449  . Hyperlipidemia associated with type 2 diabetes mellitus ([place]) 07/29/2007  . Essential hypertension 07/29/2007  . Allergic rhinitis 07/29/2007  . GERD 07/29/2007    Past Surgical History:  Procedure Laterality Date  . ABLATION Left 01/2016   Renal mass  . CESAREAN SECTION     x 1  . COLONOSCOPY    . colonscopy and endoscopy  5 yrs ago  . FOOT SURGERY     bilateral for hammer toes  . IR GENERIC HISTORICAL  12/06/2015   IR RADIOLOGIST EVAL & MGMT 12/06/2015  Aletta Edouard, MD [facility] INTERV RAD  . IR GENERIC HISTORICAL  05/03/2016   IR RADIOLOGIST EVAL & MGMT 05/03/2016 Aletta Edouard, MD [facility] INTERV RAD  . IR GENERIC HISTORICAL  02/09/2016   IR RADIOLOGIST EVAL & MGMT 02/09/2016 [facility] INTERV RAD  . IR RADIOLOGIST EVAL & MGMT  01/09/2017  . IR RADIOLOGIST EVAL & MGMT  01/14/2018  . IR RADIOLOGIST EVAL & MGMT  03/31/2019  . UPPER GASTROINTESTINAL ENDOSCOPY       OB History    Gravida  3   Para  1   Term  1   Preterm      AB  2   Living  1     SAB      TAB      Ectopic      Multiple      Live Births  1           Family History  Problem Relation Age of  Onset  . Stroke Father   . Lung cancer Father   . Coronary artery disease Father   . Hypertension Mother   . Heart attack Mother   . Colon cancer Brother 43  . Esophageal cancer Neg Hx   . Rectal cancer Neg Hx   . Stomach cancer Neg Hx     Social History   Tobacco Use  . Smoking status: Former Smoker    Types: Cigarettes    Quit date: 10/09/1999    Years since quitting: 20.2  . Smokeless tobacco: Never Used  Substance Use Topics  . Alcohol use: Yes    Alcohol/week: 1.0 standard drinks    Types: 1 Standard drinks or equivalent per week    Comment: social use  . Drug use: No    Home Medications Prior to Admission medications   Medication Sig Start Date End Date Taking? Authorizing Provider  acetaminophen (TYLENOL) 325 MG tablet Take 2 tablets (650 mg total) by mouth every 6 (six) hours as needed for moderate pain. 12/24/18   Nils Flack, Mina A, PA-C  amLODipine (NORVASC) 10 MG tablet TAKE 1 TABLET DAILY 12/01/19   Hoyt Koch, MD  aspirin 81 MG tablet Take 81 mg by mouth every morning.     [provider]  atorvastatin (LIPITOR) 20 MG tablet Take 1 tablet (20 mg total) by mouth daily. 09/28/19   Hoyt Koch, MD  blood glucose meter kit and supplies Dispense based on patient and insurance preference. Use up to four times daily as directed. (E11.9). 05/16/18   Hoyt Koch, MD  Blood Glucose Monitoring Suppl (ONE TOUCH ULTRA 2) w/Device KIT USE TO CHECK BLOOD SUGAR   TWO TIMES A DAY 09/01/18   Hoyt Koch, MD  Cholecalciferol (VITAMIN D) 2000 UNITS CAPS Take 2,000 Units by mouth daily.     [provider]  erythromycin ophthalmic ointment Place 1 application into the right eye at bedtime. 07/30/19   [provider]  famotidine (PEPCID) 40 MG tablet Take 1 tablet (40 mg total) by mouth daily. 08/05/19   Hoyt Koch, MD  fexofenadine (ALLEGRA) 180 MG tablet Take 1 tablet (180 mg total) by mouth daily. Take 1 tablet by  mouth once daily as needed for allergies 07/14/18   Hoyt Koch, MD  fluticasone [facility]) 50 MCG/ACT nasal spray Place 2 sprays into both nostrils daily. 09/16/19   Hoyt Koch, MD  omeprazole (PRILOSEC) 40 MG capsule TAKE 1 CAPSULE TWICE DAILY BEFORE MEALS 12/04/19  Hoyt Koch, MD  ondansetron (ZOFRAN ODT) 4 MG disintegrating tablet Take 1 tablet (4 mg total) by mouth every 8 (eight) hours as needed for nausea or vomiting. 12/30/19   Zakir Henner, Barbette Hair, MD  ONE TOUCH LANCETS MISC Used to check blood sugars twice daily 05/26/18   Hoyt Koch, MD  [facility] ULTRA test strip CHECK BLOOD SUGAR TWO TIMESA DAY 10/12/19   Hoyt Koch, MD  sucralfate (CARAFATE) 1 g tablet Take 1 tablet (1 g total) by mouth 4 (four) times daily -  with meals and at bedtime. 12/07/19   Hoyt Koch, MD  Alum & Mag Hydroxide-Simeth (MAGIC MOUTHWASH) SOLN 1 teaspoon gargle and swallow four times a day   12/27/11  [provider]  pantoprazole (PROTONIX) 40 MG tablet Take 1 tablet (40 mg total) by mouth daily. 07/01/18 07/01/18  Binnie Rail, MD    Allergies    Patient has no known allergies.  Review of Systems   Review of Systems  Constitutional: Negative for fever.  Respiratory: Negative for shortness of breath.   Cardiovascular: Negative for chest pain.  Gastrointestinal: Positive for abdominal pain and nausea. Negative for constipation, diarrhea and vomiting.  Genitourinary: Negative for dysuria.  All other systems reviewed and are negative.   Physical Exam Updated Vital Signs BP 132/72   Pulse 77   Temp 98.4 F (36.9 C) (Oral)   Resp 18   Ht 1.6 m ('5\' 3"' )   Wt 75 kg   SpO2 100%   BMI 29.29 kg/m   Physical Exam Vitals and nursing note reviewed.  Constitutional:      Appearance: She is well-developed. She is not ill-appearing.  HENT:     Head: Normocephalic and atraumatic.     Mouth/Throat:     Mouth: Mucous membranes are moist.  Eyes:      Pupils: Pupils are equal, round, and reactive to light.  Cardiovascular:     Rate and Rhythm: Normal rate and regular rhythm.     Heart sounds: Normal heart sounds.  Pulmonary:     Effort: Pulmonary effort is normal. No respiratory distress.     Breath sounds: No wheezing.  Abdominal:     General: Bowel sounds are normal.     Palpations: Abdomen is soft.     Tenderness: There is no abdominal tenderness. There is no guarding or rebound.  Musculoskeletal:     Cervical back: Neck supple.  Skin:    General: Skin is warm and dry.  Neurological:     Mental Status: She is alert and oriented to person, place, and time.  Psychiatric:        Mood and Affect: Mood normal.     ED Results / Procedures / Treatments   Labs (all labs ordered are listed, but only abnormal results are displayed) Labs Reviewed  LIPASE, BLOOD - Abnormal; Notable for the following components:      Result Value   Lipase 92 (*)    All other components within normal limits  COMPREHENSIVE METABOLIC PANEL - Abnormal; Notable for the following components:   Glucose, Bld 107 (*)    Creatinine, Ser 1.12 (*)    GFR calc non Af Amer 47 (*)    GFR calc Af Amer 54 (*)    All other components within normal limits  URINALYSIS, ROUTINE W REFLEX MICROSCOPIC - Abnormal; Notable for the following components:   Color, Urine COLORLESS (*)    Specific Gravity, Urine 1.004 (*)  All other components within normal limits  CBC    EKG EKG Interpretation  Date/Time:  Wednesday December 30 2019 03:48:11 EDT Ventricular Rate:  72 PR Interval:  156 QRS Duration: 83 QT Interval:  401 QTC Calculation: 439 R Axis:   56 Text Interpretation: Sinus rhythm LAE, consider biatrial enlargement Anterior infarct, old Borderline T abnormalities, inferior leads No significant change since last tracing Confirmed by Thayer Jew [phone]) on 12/30/2019 4:25:24 AM   Radiology DG Abdomen Acute W/Chest  Result Date: 12/30/2019 CLINICAL DATA:   Nausea.  Recent EGD EXAM: DG ABDOMEN ACUTE W/ 1V CHEST COMPARISON:  Abdominal CT 03/19/2019 FINDINGS: Normal heart size and mediastinal contours. No acute infiltrate or edema. No effusion or pneumothorax. Nonobstructive bowel gas pattern.  No abnormal stool retention. Thoracolumbar scoliosis. No concerning mass effect or gas collection. IMPRESSION: No acute finding in the chest or abdomen. Electronically Signed   By: Monte Fantasia M.D.   On: 12/30/2019 04:11    Procedures Procedures (including critical care time)  Medications Ordered in ED Medications  sodium chloride flush (NS) 0.9 % injection 3 mL (has no administration in time range)    ED Course  I have reviewed the triage vital signs and the nursing notes.  Pertinent labs & imaging results that were available during my care of the patient were reviewed by me and considered in my medical decision making (see chart for details).    MDM Rules/Calculators/A&P                      Patient presents with persistent nausea and unsettled feeling in her stomach.  She had the symptoms prior to EGD and they persisted following her EGD.  She also reports some anorexia.  She is overall nontoxic and her vital signs are reassuring.  Symptoms have resolved and she is currently without any symptoms.  Her physical exam is benign without tenderness or signs of peritonitis.  Considerations include but not limited to reflux, recurrent ring although unlikely, perforation although unlikely given reassuring exam and greater than 1 week from procedure, atypical ACS.  EKG is without evidence of ischemia or arrhythmia.  Acute abdominal series without evidence of free air.  Basic lab work obtained.  She has a minimally elevated lipase but this appears to be at her baseline.  Otherwise her work-up is reassuring.  She remained pain-free while in the emergency department.  We will have her follow-up closely with Dr. Ardis Hughs.  I have prescribed her Zofran and encouraged  her to continue omeprazole.  After history, exam, and medical workup I feel the patient has been appropriately medically screened and is safe for discharge home. Pertinent diagnoses were discussed with the patient. Patient was given return precautions.    Final Clinical Impression(s) / ED Diagnoses Final diagnoses:  Nausea    Rx / DC Orders ED Discharge Orders         Ordered    ondansetron (ZOFRAN ODT) 4 MG disintegrating tablet  Every 8 hours PRN     12/30/19 0500           Kynzee Devinney, Barbette Hair, MD 12/30/19 [id number]

## 2019-12-30 NOTE — Telephone Encounter (Signed)
Pt scheduled to see Dr. Ardis Hughs 02/02/20@11 :10am, pt aware of appt.

## 2019-12-30 NOTE — Telephone Encounter (Signed)
Probably best to discuss her symptoms in person.  Offer her my first available NGI or ROV appt.  Thanks

## 2020-01-05 ENCOUNTER — Telehealth: Payer: Self-pay

## 2020-01-05 NOTE — Telephone Encounter (Signed)
Patient is requesting Tetanus/Tdap injection as health maintenance. Please advise.

## 2020-01-06 NOTE — Telephone Encounter (Signed)
Fine would recommend to get at pharmacy due to insurance for better cost.

## 2020-01-06 NOTE — Telephone Encounter (Signed)
pls advise if ok for TDAP...Jenny Madden

## 2020-01-07 ENCOUNTER — Other Ambulatory Visit: Payer: Self-pay

## 2020-01-07 MED ORDER — FAMOTIDINE 40 MG PO TABS: 40.0000 mg | ORAL_TABLET | Freq: Every day | ORAL | 3 refills | Status: DC

## 2020-01-07 MED ORDER — TETANUS-DIPHTH-ACELL PERTUSSIS 5-2.5-18.5 LF-MCG/0.5 IM SUSP: 0.5000 mL | Freq: Once | INTRAMUSCULAR | 0 refills | Status: AC

## 2020-01-07 NOTE — Telephone Encounter (Signed)
Pt return call back gave her MD response. Sent [facility].../lmb

## 2020-01-07 NOTE — Telephone Encounter (Signed)
Called ot no answer LMOM RTC.Marland KitchenJohny Chess

## 2020-01-14 DIAGNOSIS — R69 Illness, unspecified: Secondary | ICD-10-CM | POA: Diagnosis not present

## 2020-01-25 ENCOUNTER — Other Ambulatory Visit: Payer: Self-pay

## 2020-01-25 ENCOUNTER — Encounter: Payer: Self-pay | Admitting: Cardiovascular Disease

## 2020-01-25 ENCOUNTER — Ambulatory Visit: Payer: Medicare HMO | Admitting: Cardiovascular Disease

## 2020-01-25 VITALS — BP 126/74 | HR 86 | Ht 63.5 in | Wt 151.0 lb

## 2020-01-25 DIAGNOSIS — R011 Cardiac murmur, unspecified: Secondary | ICD-10-CM | POA: Diagnosis not present

## 2020-01-25 NOTE — Patient Instructions (Signed)
Medication Instructions:  No changes *If you need a refill on your cardiac medications before your next appointment, please call your pharmacy*   Lab Work: none  Testing/Procedures: Your physician has requested that you have an echocardiogram. Echocardiography is a painless test that uses sound waves to create images of your heart. It provides your doctor with information about the size and shape of your heart and how well your heart's chambers and valves are working. This procedure takes approximately one hour. There are no restrictions for this procedure.   Follow-Up: At [facility], you and your health needs are our priority.  As part of our continuing mission to provide you with exceptional heart care, we have created designated Provider Care Teams.  These Care Teams include your primary Cardiologist (physician) and Advanced Practice Providers (APPs -  Physician Assistants and Nurse Practitioners) who all work together to provide you with the care you need, when you need it.  Your next appointment:   12 month(s)  The format for your next appointment:   In Person  Provider:   You may see Christopher McAlhany, MD or one of the following Advanced Practice Providers on your designated Care Team:    Dayna Dunn, PA-C  Michele Lenze, PA-C   Other Instructions   

## 2020-01-25 NOTE — Progress Notes (Signed)
Chief Complaint  Patient presents with  . New Patient (Initial Visit)    cardiac murmur.    History of Present Illness: 79 yo female with history of renal cancer s/p ablation, CKD, HTN, HLD and DM who is here today as a new consult, referred by Dr. Sharlet Salina for the evaluation of palpitations and a cardiac murmur. She had undergone upper endoscopy with esophageal dilatation in mid march 2021 and then was seen in the ED with epigastric pain and nausea. Her symptoms were felt to be due to her esophageal issues. Troponin negative. Echo in 2014 with LVEF=70% with mitral annular calcification but no MR. She has no chest pain, dyspnea, dizziness, near syncope or syncope. Mild chronic LE edema that resolves at night. On norvasc. She feels great overall.   Primary Care Physician: Hoyt Koch, MD  Past Medical History:  Diagnosis Date  . Allergic rhinitis, cause unspecified   . Anxiety state, unspecified    none recent  . Benign neoplasm of colon   . Cancer ([place])    renal mass  . Cataract   . Chronic kidney disease 01/2016   Tumor left side  . Diverticulosis of colon (without mention of hemorrhage)   . Dysphagia   . Family history of adverse reaction to anesthesia 3 yrs ago   slow to awaken   . Family history of malignant neoplasm of gastrointestinal tract   . Heart murmur   . Hypertension   . Nonspecific (abnormal) findings on radiological and other examination of other intrathoracic organs   . Nonspecific abnormal electrocardiogram (ECG) (EKG)   . Osteoporosis   . Other dysphagia   . Pure hypercholesterolemia   . Type II or unspecified type diabetes mellitus without mention of complication, not stated as uncontrolled   . Unspecified essential hypertension   . Unspecified tinnitus years ago    Past Surgical History:  Procedure Laterality Date  . ABLATION Left 01/2016   Renal mass  . CATARACT EXTRACTION    . CESAREAN SECTION     x 1  . COLONOSCOPY    . colonscopy  and endoscopy  5 yrs ago  . FOOT SURGERY     bilateral for hammer toes  . IR GENERIC HISTORICAL  12/06/2015   IR RADIOLOGIST EVAL & MGMT 12/06/2015 Aletta Edouard, MD [facility] INTERV RAD  . IR GENERIC HISTORICAL  05/03/2016   IR RADIOLOGIST EVAL & MGMT 05/03/2016 Aletta Edouard, MD [facility] INTERV RAD  . IR GENERIC HISTORICAL  02/09/2016   IR RADIOLOGIST EVAL & MGMT 02/09/2016 [facility] INTERV RAD  . IR RADIOLOGIST EVAL & MGMT  01/09/2017  . IR RADIOLOGIST EVAL & MGMT  01/14/2018  . IR RADIOLOGIST EVAL & MGMT  03/31/2019  . UPPER GASTROINTESTINAL ENDOSCOPY      Current Outpatient Medications  Medication Sig Dispense Refill  . acetaminophen (TYLENOL) 325 MG tablet Take 2 tablets (650 mg total) by mouth every 6 (six) hours as needed for moderate pain. 30 tablet 0  . amLODipine (NORVASC) 10 MG tablet TAKE 1 TABLET DAILY 90 tablet 1  . aspirin 81 MG tablet Take 81 mg by mouth every morning.     Marland Kitchen atorvastatin (LIPITOR) 20 MG tablet Take 1 tablet (20 mg total) by mouth daily. 30 tablet 0  . blood glucose meter kit and supplies Dispense based on patient and insurance preference. Use up to four times daily as directed. (E11.9). 1 each 0  . Blood Glucose Monitoring Suppl (ONE TOUCH ULTRA 2) w/Device  KIT USE TO CHECK BLOOD SUGAR   TWO TIMES A DAY 1 each 0  . Cholecalciferol (VITAMIN D) 2000 UNITS CAPS Take 2,000 Units by mouth daily.     . famotidine (PEPCID) 40 MG tablet Take 1 tablet (40 mg total) by mouth daily. 90 tablet 3  . fexofenadine (ALLEGRA) 180 MG tablet Take 1 tablet (180 mg total) by mouth daily. Take 1 tablet by mouth once daily as needed for allergies 90 tablet 3  . fluticasone (FLONASE) 50 MCG/ACT nasal spray Place 2 sprays into both nostrils daily. 48 g 3  . omeprazole (PRILOSEC) 40 MG capsule TAKE 1 CAPSULE TWICE DAILY BEFORE MEALS 180 capsule 1  . ondansetron (ZOFRAN ODT) 4 MG disintegrating tablet Take 1 tablet (4 mg total) by mouth every 8 (eight) hours as needed for nausea or vomiting. 20  tablet 0  . ONE TOUCH LANCETS MISC Used to check blood sugars twice daily 200 each 0  . ONETOUCH ULTRA test strip CHECK BLOOD SUGAR TWO TIMESA DAY 100 strip 2   No current facility-administered medications for this visit.    No Known Allergies  Social History   Socioeconomic History  . Marital status: Widowed    Spouse name: bob x 37 yrs  . Number of children: 1  . Years of education: Not on file  . Highest education level: Not on file  Occupational History  . Occupation: Airline pilot: RETIRED  Tobacco Use  . Smoking status: Former Smoker    Packs/day: 0.50    Years: 30.00    Pack years: 15.00    Types: Cigarettes    Quit date: 10/09/1999    Years since quitting: 20.3  . Smokeless tobacco: Never Used  Substance and Sexual Activity  . Alcohol use: Yes    Alcohol/week: 1.0 standard drinks    Types: 1 Standard drinks or equivalent per week    Comment: social use  . Drug use: No  . Sexual activity: Never    Comment: 1st intercourse- 21, partners- 3, widow   Other Topics Concern  . Not on file  Social History Narrative   Married to husband, [name] x14yr   3 children - 1 son w/ Hodgkin's, 2 daughters alive and well   High school education   Social Determinants of Health   Financial Resource Strain: Low Risk   . Difficulty of Paying Living Expenses: Not hard at all  Food Insecurity: No Food Insecurity  . Worried About RCharity fundraiserin the Last Year: Never true  . Ran Out of Food in the Last Year: Never true  Transportation Needs: No Transportation Needs  . Lack of Transportation (Medical): No  . Lack of Transportation (Non-Medical): No  Physical Activity: Sufficiently Active  . Days of Exercise per Week: 5 days  . Minutes of Exercise per Session: 50 min  Stress: No Stress Concern Present  . Feeling of Stress : Only a little  Social Connections: Slightly Isolated  . Frequency of Communication with Friends and Family: More than three times a week  . Frequency  of Social Gatherings with Friends and Family: More than three times a week  . Attends Religious Services: More than 4 times per year  . Active Member of Clubs or Organizations: Yes  . Attends CArchivistMeetings: More than 4 times per year  . Marital Status: Widowed  Intimate Partner Violence:   . Fear of Current or Ex-Partner:   . Emotionally Abused:   .  Physically Abused:   . Sexually Abused:     Family History  Problem Relation Age of Onset  . Colon cancer Brother 52  . Esophageal cancer Neg Hx   . Rectal cancer Neg Hx   . Stomach cancer Neg Hx     Review of Systems:  As stated in the HPI and otherwise negative.   BP 126/74   Pulse 86   Ht 5' 3.5" (1.613 m)   Wt 151 lb (68.5 kg)   SpO2 99%   BMI 26.33 kg/m   Physical Examination: General: Well developed, well nourished, NAD  HEENT: OP clear, mucus membranes moist  SKIN: warm, dry. No rashes. Neuro: No focal deficits  Musculoskeletal: Muscle strength 5/5 all ext  Psychiatric: Mood and affect normal  Neck: No JVD, no carotid bruits, no thyromegaly, no lymphadenopathy.  Lungs:Clear bilaterally, no wheezes, rhonci, crackles Cardiovascular: Regular rate and rhythm. Systolic murmur.  Abdomen:Soft. Bowel sounds present. Non-tender.  Extremities: No lower extremity edema. Pulses are 2 + in the bilateral DP/PT.  EKG:  EKG is not ordered today. The ekg ordered today demonstrates   Recent Labs: 12/05/2019: Magnesium 2.1 12/07/2019: TSH 1.84 12/29/2019: ALT 14; BUN 11; Creatinine, Ser 1.12; Hemoglobin 13.1; Platelets 283; Potassium 3.7; Sodium 141   Lipid Panel    Component Value Date/Time   CHOL 196 09/16/2019 1050   TRIG 69.0 09/16/2019 1050   HDL 69.20 09/16/2019 1050   CHOLHDL 3 09/16/2019 1050   VLDL 13.8 09/16/2019 1050   LDLCALC 113 (H) 09/16/2019 1050     Wt Readings from Last 3 Encounters:  01/25/20 151 lb (68.5 kg)  12/29/19 165 lb 5.5 oz (75 kg)  12/21/19 154 lb (69.9 kg)     Assessment and  Plan:   1. Cardiac murmur: She has had a murmur for years per records. No prior valve disease. Will arrange an echo now.   Current medicines are reviewed at length with the patient today.  The patient does not have concerns regarding medicines.  The following changes have been made:  no change  Labs/ tests ordered today include:   Orders Placed This Encounter  Procedures  . ECHOCARDIOGRAM COMPLETE     Disposition:   FU with me in one year.    Signed, Lauree Chandler, MD 01/25/2020 10:21 AM    [facility] [address], [place], [place]  [address] Phone: [phone]; Fax: [phone]

## 2020-01-27 ENCOUNTER — Other Ambulatory Visit: Payer: Self-pay | Admitting: Internal Medicine

## 2020-01-29 ENCOUNTER — Telehealth: Payer: Self-pay | Admitting: Internal Medicine

## 2020-01-29 NOTE — Telephone Encounter (Signed)
Refill was sent into pharmacy on 01/28/2020

## 2020-01-29 NOTE — Telephone Encounter (Signed)
New message:   1.Medication Requested: ONETOUCH ULTRA test strip 2. Pharmacy (Name, Street, [place]): [facility], [place] to Registered [employer] Sites 3. On Med List: Yes  4. Last Visit with PCP: 12/07/19  5. Next visit date with PCP: 03/29/20  Pt states she is almost out Agent: Please be advised that RX refills may take up to 3 business days. We ask that you follow-up with your pharmacy.

## 2020-02-02 ENCOUNTER — Encounter: Payer: Self-pay | Admitting: Gastroenterology

## 2020-02-02 ENCOUNTER — Ambulatory Visit: Payer: Medicare HMO | Admitting: Gastroenterology

## 2020-02-02 VITALS — BP 142/68 | HR 64 | Temp 98.3°F | Ht 63.5 in | Wt 151.2 lb

## 2020-02-02 DIAGNOSIS — K222 Esophageal obstruction: Secondary | ICD-10-CM

## 2020-02-02 DIAGNOSIS — R1310 Dysphagia, unspecified: Secondary | ICD-10-CM | POA: Diagnosis not present

## 2020-02-02 MED ORDER — FAMOTIDINE 40 MG PO TABS: 40.0000 mg | ORAL_TABLET | Freq: Every day | ORAL | 3 refills | Status: DC

## 2020-02-02 NOTE — Patient Instructions (Addendum)
If you are age 80 or older, your body mass index should be between 23-30. Your Body mass index is 26.36 kg/m. If this is out of the aforementioned range listed, please consider follow up with your Primary Care Provider.  If you are age 53 or younger, your body mass index should be between 19-25. Your Body mass index is 26.36 kg/m. If this is out of the aformentioned range listed, please consider follow up with your Primary Care Provider.   Continue taking omeprazole 40mg  one tablet twice daily.  Please take Pepcid at bedtime.  You will follow up with our office on an as needed basis or if symptoms worsen or fail to improve.  Thank you for entrusting me with your care and choosing [facility].  Dr Ardis Hughs

## 2020-02-02 NOTE — Progress Notes (Signed)
Review of pertinent gastrointestinal problems: 1. Elevated risk for colon cancer: brother with colon cancer and personal history of adenomatous polyps: 10/2016 Colonoscopy Dr. Ardis Hughs found two subCM adenomas, diverticulosis.  No recall given age 79.  Dysphagia: EGD 10/2011 schatzki's ring and HH (ring dilated to 58m).  EGD April 2019 for recurrent dysphagia dilated the thin Schatzki's ring to 20 mm again.  Mild gastritis was biopsied, pathology showed no H. Pylori.  Recurrent dysphagia led to EGD March 2021 same Schatzki's ring was noted again and dilated to 20 mm.   HPI: This is a very pleasant 79year old woman whom I last saw the time of upper endoscopy about 1 month ago.  I last saw the time of EGD about 1 month ago.  Her Schatzki's ring was dilated to 20 mm.  She was recommended to eat slowly, chew her food well and take small bites.  Her dysphagia has completely resolved.  She was bothered by nausea and some palpitations for about a week or 2 afterwards.  Those have resolved.  She is feeling completely back to normal now.   ROS: complete GI ROS as described in HPI, all other review negative.  Constitutional:  No unintentional weight loss   Past Medical History:  Diagnosis Date  . Allergic rhinitis, cause unspecified   . Anxiety state, unspecified    none recent  . Benign neoplasm of colon   . [place]    ren[place]  . Cataract   . Chronic kidney disease 01/2016   Tumor left side  . Diverticulosis of colon (without mention of hemorrhage)   . Dysphagia   . Family history of adverse reaction to anesthesia 3 yrs ago   slow to awaken   . Family history of malignant neoplasm of gastrointestinal tract   . Heart murmur   . Hypertension   . Nonspecific (abnormal) findings on radiological and other examination of other intrathoracic organs   . Nonspecific abnormal electrocardiogram (ECG) (EKG)   . Osteoporosis   . Other dysphagia   . Pure hypercholesterolemia   . Type II or  unspecified type diabetes mellitus without mention of complication, not stated as uncontrolled   . Unspecified essential hypertension   . Unspecified tinnitus years ago    Past Surgical History:  Procedure Laterality Date  . ABLATION Left 01/2016   Renal mass  . CATARACT EXTRACTION    . CESAREAN SECTION     x 1  . COLONOSCOPY    . colonscopy and endoscopy  5 yrs ago  . FOOT SURGERY     bilateral for hammer toes  . IR GENERIC HISTORICAL  12/06/2015   IR RADIOLOGIST EVAL & MGMT 12/06/2015 GAletta Edouard MD [facility] INTERV RAD  . IR GENERIC HISTORICAL  05/03/2016   IR RADIOLOGIST EVAL & MGMT 05/03/2016 GAletta Edouard MD [facility] INTERV RAD  . IR GENERIC HISTORICAL  02/09/2016   IR RADIOLOGIST EVAL & MGMT 02/09/2016 [facility] INTERV RAD  . IR RADIOLOGIST EVAL & MGMT  01/09/2017  . IR RADIOLOGIST EVAL & MGMT  01/14/2018  . IR RADIOLOGIST EVAL & MGMT  03/31/2019  . UPPER GASTROINTESTINAL ENDOSCOPY      Current Outpatient Medications  Medication Sig Dispense Refill  . acetaminophen (TYLENOL) 325 MG tablet Take 2 tablets (650 mg total) by mouth every 6 (six) hours as needed for moderate pain. 30 tablet 0  . amLODipine (NORVASC) 10 MG tablet TAKE 1 TABLET DAILY 90 tablet 1  . aspirin 81 MG tablet Take 81 mg  by mouth every morning.     Marland Kitchen atorvastatin (LIPITOR) 20 MG tablet Take 1 tablet (20 mg total) by mouth daily. 30 tablet 0  . blood glucose meter kit and supplies Dispense based on patient and insurance preference. Use up to four times daily as directed. (E11.9). 1 each 0  . Blood Glucose Monitoring Suppl (ONE TOUCH ULTRA 2) w/Device KIT USE TO CHECK BLOOD SUGAR   TWO TIMES A DAY 1 each 0  . Cholecalciferol (VITAMIN D) 2000 UNITS CAPS Take 2,000 Units by mouth daily.     . famotidine (PEPCID) 40 MG tablet Take 1 tablet (40 mg total) by mouth daily. 90 tablet 3  . fexofenadine (ALLEGRA) 180 MG tablet Take 1 tablet (180 mg total) by mouth daily. Take 1 tablet by mouth once daily as needed for allergies 90  tablet 3  . fluticasone (FLONASE) 50 MCG/ACT nasal spray Place 2 sprays into both nostrils daily. 48 g 3  . omeprazole (PRILOSEC) 40 MG capsule TAKE 1 CAPSULE TWICE DAILY BEFORE MEALS 180 capsule 1  . ondansetron (ZOFRAN ODT) 4 MG disintegrating tablet Take 1 tablet (4 mg total) by mouth every 8 (eight) hours as needed for nausea or vomiting. 20 tablet 0  . ONE TOUCH LANCETS MISC Used to check blood sugars twice daily 200 each 0  . ONETOUCH ULTRA test strip CHECK BLOOD SUGAR TWO TIMESA DAY 180 strip 2   No current facility-administered medications for this visit.    Allergies as of 02/02/2020  . (No Known Allergies)    Family History  Problem Relation Age of Onset  . Colon cancer Brother 55  . Esophageal cancer Neg Hx   . Rectal cancer Neg Hx   . Stomach cancer Neg Hx     Social History   Socioeconomic History  . Marital status: Widowed    Spouse name: bob x 37 yrs  . Number of children: 1  . Years of education: Not on file  . Highest education level: Not on file  Occupational History  . Occupation: Airline pilot: RETIRED  Tobacco Use  . Smoking status: Former Smoker    Packs/day: 0.50    Years: 30.00    Pack years: 15.00    Types: Cigarettes    Quit date: 10/09/1999    Years since quitting: 20.3  . Smokeless tobacco: Never Used  Substance and Sexual Activity  . Alcohol use: Yes    Alcohol/week: 1.0 standard drinks    Types: 1 Standard drinks or equivalent per week    Comment: social use  . Drug use: No  . Sexual activity: Never    Comment: 1st intercourse- 21, partners- 3, widow   Other Topics Concern  . Not on file  Social History Narrative   Married to husband, [name] x54yr   3 children - 1 son w/ Hodgkin's, 2 daughters alive and well   High school education   Social Determinants of Health   Financial Resource Strain: Low Risk   . Difficulty of Paying Living Expenses: Not hard at all  Food Insecurity: No Food Insecurity  . Worried About RSales executivein the Last Year: Never true  . Ran Out of Food in the Last Year: Never true  Transportation Needs: No Transportation Needs  . Lack of Transportation (Medical): No  . Lack of Transportation (Non-Medical): No  Physical Activity: Sufficiently Active  . Days of Exercise per Week: 5 days  . Minutes of Exercise per Session:  50 min  Stress: No Stress Concern Present  . Feeling of Stress : Only a little  Social Connections: Slightly Isolated  . Frequency of Communication with Friends and Family: More than three times a week  . Frequency of Social Gatherings with Friends and Family: More than three times a week  . Attends Religious Services: More than 4 times per year  . Active Member of Clubs or Organizations: Yes  . Attends Archivist Meetings: More than 4 times per year  . Marital Status: Widowed  Intimate Partner Violence:   . Fear of Current or Ex-Partner:   . Emotionally Abused:   Marland Kitchen Physically Abused:   . Sexually Abused:      Physical Exam: BP (!) 142/68   Pulse 64   Temp 98.3 F (36.8 C)   Ht 5' 3.5" (1.613 m)   Wt 151 lb 3.2 oz (68.6 kg)   BMI 26.36 kg/m  Constitutional: generally well-appearing Psychiatric: alert and oriented x3 Abdomen: soft, nontender, nondistended, no obvious ascites, no peritoneal signs, normal bowel sounds No peripheral edema noted in lower extremities  Assessment and plan: 79 y.o. female with GERD, Schatzki's ring  Her dysphagia has completely improved again since her EGD and dilation last month.  She usually gets about 2 years of relief from this.  She asked about her antiacid coverage and if she could take anything stronger than the omeprazole.  I recommended instead that she change the way she is taking her H2 blocker from midday to bedtime dosing.  She will therefore be on omeprazole 40 mg twice daily shortly before breakfast and dinner.  She will also take Pepcid at bedtime every night.  She will return to see me as  needed.  Please see the "Patient Instructions" section for addition details about the plan.  Owens Loffler, MD [facility] 02/02/2020, 11:09 AM   Total time on date of encounter was 20 minutes (this included time spent preparing to see the patient reviewing records; obtaining and/or reviewing separately obtained history; performing a medically appropriate exam and/or evaluation; counseling and educating the patient and family if present; ordering medications, tests or procedures if applicable; and documenting clinical information in the health record).

## 2020-02-04 ENCOUNTER — Telehealth: Payer: Self-pay

## 2020-02-04 NOTE — Telephone Encounter (Signed)
1.Medication Requested:ONETOUCH ULTRA test strip  2. Pharmacy ([place], [place], [place]):[facility] [place], [place] [facility]  3. On Med List: Yes   4. Last Visit with PCP: 3.1.2021   5. Next visit date with PCP: 6.22.2021    Agent: Please be advised that RX refills may take up to 3 business days. We ask that you follow-up with your pharmacy.

## 2020-02-05 ENCOUNTER — Other Ambulatory Visit: Payer: Self-pay | Admitting: Internal Medicine

## 2020-02-05 DIAGNOSIS — R69 Illness, unspecified: Secondary | ICD-10-CM | POA: Diagnosis not present

## 2020-02-05 NOTE — Telephone Encounter (Signed)
[facility]

## 2020-02-05 NOTE — Telephone Encounter (Signed)
New Message:   Pt is calling back and states that [facility] still has not received the request for a refill for her test strips. Pt would like a call from [place]. Please advise.

## 2020-02-09 ENCOUNTER — Other Ambulatory Visit: Payer: Self-pay

## 2020-02-09 DIAGNOSIS — R69 Illness, unspecified: Secondary | ICD-10-CM | POA: Diagnosis not present

## 2020-02-09 MED ORDER — ONETOUCH ULTRA 2 W/DEVICE KIT: PACK | 0 refills | Status: DC

## 2020-02-17 ENCOUNTER — Ambulatory Visit (HOSPITAL_COMMUNITY): Payer: Medicare HMO | Attending: Internal Medicine

## 2020-02-17 ENCOUNTER — Other Ambulatory Visit: Payer: Self-pay

## 2020-02-17 DIAGNOSIS — R011 Cardiac murmur, unspecified: Secondary | ICD-10-CM

## 2020-02-18 ENCOUNTER — Telehealth: Payer: Self-pay | Admitting: Cardiovascular Disease

## 2020-02-18 NOTE — Telephone Encounter (Signed)
[name], MD  02/18/2020 10:53 AM EDT    Her heart is strong. Her heart muscle is thicker than normal and this creates her murmur. Her valves are ok. She was feeling well when I saw her so no further workup at this time. Jenny Madden   Informed patient of results. Patient verbalized understanding.

## 2020-02-18 NOTE — Telephone Encounter (Signed)
° °  Pt would like to know echo results  

## 2020-03-02 DIAGNOSIS — R69 Illness, unspecified: Secondary | ICD-10-CM | POA: Diagnosis not present

## 2020-03-03 ENCOUNTER — Other Ambulatory Visit: Payer: Self-pay | Admitting: Interventional Radiology

## 2020-03-03 DIAGNOSIS — N2889 Other specified disorders of kidney and ureter: Secondary | ICD-10-CM

## 2020-03-09 ENCOUNTER — Other Ambulatory Visit: Payer: Self-pay

## 2020-03-09 DIAGNOSIS — N2889 Other specified disorders of kidney and ureter: Secondary | ICD-10-CM

## 2020-03-17 ENCOUNTER — Ambulatory Visit (INDEPENDENT_AMBULATORY_CARE_PROVIDER_SITE_OTHER): Payer: Medicare HMO | Admitting: Internal Medicine

## 2020-03-17 ENCOUNTER — Encounter: Payer: Self-pay | Admitting: Internal Medicine

## 2020-03-17 ENCOUNTER — Other Ambulatory Visit: Payer: Self-pay

## 2020-03-17 ENCOUNTER — Ambulatory Visit: Payer: Medicare HMO

## 2020-03-17 VITALS — BP 128/82 | HR 83 | Temp 98.5°F | Ht 63.5 in | Wt 150.0 lb

## 2020-03-17 DIAGNOSIS — M7061 Trochanteric bursitis, right hip: Secondary | ICD-10-CM | POA: Diagnosis not present

## 2020-03-17 DIAGNOSIS — E119 Type 2 diabetes mellitus without complications: Secondary | ICD-10-CM | POA: Diagnosis not present

## 2020-03-17 DIAGNOSIS — I872 Venous insufficiency (chronic) (peripheral): Secondary | ICD-10-CM | POA: Diagnosis not present

## 2020-03-17 DIAGNOSIS — R69 Illness, unspecified: Secondary | ICD-10-CM | POA: Diagnosis not present

## 2020-03-17 LAB — POCT GLYCOSYLATED HEMOGLOBIN (HGB A1C): Hemoglobin A1C: 5.3 % (ref 4.0–5.6)

## 2020-03-17 MED ORDER — PANTOPRAZOLE SODIUM 40 MG PO TBEC: 40.0000 mg | DELAYED_RELEASE_TABLET | Freq: Two times a day (BID) | ORAL | 3 refills | Status: DC

## 2020-03-17 MED ORDER — METHYLPREDNISOLONE ACETATE 40 MG/ML IJ SUSP
40.0000 mg | Freq: Once | INTRAMUSCULAR | Status: AC
  Administered 2020-03-17: 40 mg via INTRAMUSCULAR

## 2020-03-17 NOTE — Patient Instructions (Addendum)
We have switched the omeprazole to protonix (pantoprazole) to take 1 pill twice a day.    Hip Bursitis Rehab Ask your health care provider which exercises are safe for you. Do exercises exactly as told by your health care provider and adjust them as directed. It is normal to feel mild stretching, pulling, tightness, or discomfort as you do these exercises. Stop right away if you feel sudden pain or your pain gets worse. Do not begin these exercises until told by your health care provider. Stretching exercise This exercise warms up your muscles and joints and improves the movement and flexibility of your hip. This exercise also helps to relieve pain and stiffness. Iliotibial band stretch An iliotibial band is a strong band of muscle tissue that runs from the outer side of your hip to the outer side of your thigh and knee. 1. Lie on your side with your left / right leg in the top position. 2. Bend your left / right knee and grab your ankle. Stretch out your bottom arm to help you balance. 3. Slowly bring your knee back so your thigh is behind your body. 4. Slowly lower your knee toward the floor until you feel a gentle stretch on the outside of your left / right thigh. If you do not feel a stretch and your knee will not fall farther, place the heel of your other foot on top of your knee and pull your knee down toward the floor with your foot. 5. Hold this position for __________ seconds. 6. Slowly return to the starting position. Repeat __________ times. Complete this exercise __________ times a day. Strengthening exercises These exercises build strength and endurance in your hip and pelvis. Endurance is the ability to use your muscles for a long time, even after they get tired. Bridge This exercise strengthens the muscles that move your thigh backward (hip extensors). 1. Lie on your back on a firm surface with your knees bent and your feet flat on the floor. 2. Tighten your buttocks muscles and  lift your buttocks off the floor until your trunk is level with your thighs. ? Do not arch your back. ? You should feel the muscles working in your buttocks and the back of your thighs. If you do not feel these muscles, slide your feet 1-2 inches (2.5-5 cm) farther away from your buttocks. ? If this exercise is too easy, try doing it with your arms crossed over your chest. 3. Hold this position for __________ seconds. 4. Slowly lower your hips to the starting position. 5. Let your muscles relax completely after each repetition. Repeat __________ times. Complete this exercise __________ times a day. Squats This exercise strengthens the muscles in front of your thigh and knee (quadriceps). 1. Stand in front of a table, with your feet and knees pointing straight ahead. You may rest your hands on the table for balance but not for support. 2. Slowly bend your knees and lower your hips like you are going to sit in a chair. ? Keep your weight over your heels, not over your toes. ? Keep your lower legs upright so they are parallel with the table legs. ? Do not let your hips go lower than your knees. ? Do not bend lower than told by your health care provider. ? If your hip pain increases, do not bend as low. 3. Hold the squat position for __________ seconds. 4. Slowly push with your legs to return to standing. Do not use your hands to  pull yourself to standing. Repeat __________ times. Complete this exercise __________ times a day. Hip hike 1. Stand sideways on a bottom step. Stand on your left / right leg with your other foot unsupported next to the step. You can hold on to the railing or wall for balance if needed. 2. Keep your knees straight and your torso square. Then lift your left / right hip up toward the ceiling. 3. Hold this position for __________ seconds. 4. Slowly let your left / right hip lower toward the floor, past the starting position. Your foot should get closer to the floor. Do not  lean or bend your knees. Repeat __________ times. Complete this exercise __________ times a day. Single leg stand 1. Without shoes, stand near a railing or in a doorway. You may hold on to the railing or door frame as needed for balance. 2. Squeeze your left / right buttock muscles, then lift up your other foot. ? Do not let your left / right hip push out to the side. ? It is helpful to stand in front of a mirror for this exercise so you can watch your hip. 3. Hold this position for __________ seconds. Repeat __________ times. Complete this exercise __________ times a day. This information is not intended to replace advice given to you by your health care provider. Make sure you discuss any questions you have with your health care provider. Document Revised: 01/19/2019 Document Reviewed: 01/19/2019 Elsevier Patient Education  [place].

## 2020-03-17 NOTE — Assessment & Plan Note (Signed)
POC HgA1c done at 5.3 today. Controlled by diet. On statin. Encouraged to keep up good work with diet.

## 2020-03-17 NOTE — Assessment & Plan Note (Signed)
Given depo-medrol 40 mg IM and stretching exercises to help.

## 2020-03-17 NOTE — Assessment & Plan Note (Signed)
Could be related to amlodipine and offered change today but she did not want to change. Also offered compression stockings.

## 2020-03-17 NOTE — Progress Notes (Signed)
   Subjective:   Patient ID: Jenny Madden, female    DOB: Nov 26, 1940, 79 y.o.   MRN: [id number]  HPI The patient is a 79 YO female coming in for follow up diabetes (diet controlled currently, denies low sugars, denies numbness or tingling in hands or feet) and swelling in ankles (better if she elevates feet, takes amlodipine and dose not changed recently, denies change in water or salt intake, has been present for some time and overall stable, better in the morning and worsen throughout the day while she is on her feet, denies pain) and right hip pain (started about 1 month ago, denies injury or overuse, walks daily, denies worsening with walking or improving, denies falls, did try tylenol which was not helpful, pain mostly in the side of hip and sometimes down to close to the knee).   Review of Systems  Constitutional: Negative.   HENT: Negative.   Eyes: Negative.   Respiratory: Negative for cough, chest tightness and shortness of breath.   Cardiovascular: Positive for leg swelling. Negative for chest pain and palpitations.  Gastrointestinal: Negative for abdominal distention, abdominal pain, constipation, diarrhea, nausea and vomiting.  Musculoskeletal: Positive for arthralgias and myalgias.  Skin: Negative.   Neurological: Negative.   Psychiatric/Behavioral: Negative.     Objective:  Physical Exam Constitutional:      Appearance: She is well-developed.  HENT:     Head: Normocephalic and atraumatic.  Cardiovascular:     Rate and Rhythm: Normal rate and regular rhythm.  Pulmonary:     Effort: Pulmonary effort is normal. No respiratory distress.     Breath sounds: Normal breath sounds. No wheezing or rales.  Abdominal:     General: Bowel sounds are normal. There is no distension.     Palpations: Abdomen is soft.     Tenderness: There is no abdominal tenderness. There is no rebound.  Musculoskeletal:     Cervical back: Normal range of motion.     Comments: 1+ edema bilateral  pedal and ankle, pain lateral right thigh region  Skin:    General: Skin is warm and dry.  Neurological:     Mental Status: She is alert and oriented to person, place, and time.     Coordination: Coordination normal.     Vitals:   03/17/20 1027  BP: 128/82  Pulse: 83  Temp: 98.5 F (36.9 C)  TempSrc: Oral  SpO2: 98%  Weight: 150 lb (68 kg)  Height: 5' 3.5" (1.613 m)    This visit occurred during the SARS-CoV-2 public health emergency.  Safety protocols were in place, including screening questions prior to the visit, additional usage of staff PPE, and extensive cleaning of exam room while observing appropriate contact time as indicated for disinfecting solutions.   Assessment & Plan:  Depo-medrol 40 mg IM

## 2020-03-18 ENCOUNTER — Telehealth: Payer: Self-pay

## 2020-03-18 NOTE — Telephone Encounter (Deleted)
Error

## 2020-03-23 ENCOUNTER — Ambulatory Visit (INDEPENDENT_AMBULATORY_CARE_PROVIDER_SITE_OTHER): Payer: Medicare HMO

## 2020-03-23 ENCOUNTER — Other Ambulatory Visit: Payer: Self-pay

## 2020-03-23 VITALS — BP 118/60 | HR 67 | Temp 98.3°F | Resp 16 | Ht 64.0 in | Wt 149.6 lb

## 2020-03-23 DIAGNOSIS — Z0000 Encounter for general adult medical examination without abnormal findings: Secondary | ICD-10-CM

## 2020-03-23 NOTE — Patient Instructions (Signed)
Ms. Derk , Thank you for taking time to come for your Medicare Wellness Visit. I appreciate your ongoing commitment to your health goals. Please review the following plan we discussed and let me know if I can assist you in the future.   Screening recommendations/referrals: Colonoscopy: last done 11/07/2016; due every 5 years Mammogram: last done 08/28/2019; due every year Bone Density: last done 10/03/2017; due every 2-3 years Recommended yearly ophthalmology/optometry visit for glaucoma screening and checkup Recommended yearly dental visit for hygiene and checkup  Vaccinations: Influenza vaccine: 06/11/2019 Pneumococcal vaccine: completed Tdap vaccine: 12/28/2009; due every 10 years; overdue for Tdap Shingles vaccine: will check with insurance for cost (Shringrix) Covid-19: completed  Advanced directives: Advance directive discussed with you today. Even though you declined this today please call our office should you change your mind and we can give you the proper paperwork for you to fill out.  Conditions/risks identified: Please continue to do your personal lifestyle choices by: daily care of teeth and gums, regular physical activity (goal should be 5 days a week for 30 minutes), eat a healthy diet, avoid tobacco and drug use, limiting any alcohol intake, taking a low-dose aspirin (if not allergic or have been advised by your provider otherwise) and taking vitamins and minerals as recommended by your provider.  Next appointment: Please schedule your next Medicare Wellness Visit with your Nurse Health Advisor in 1 year.   Preventive Care 35 Years and Older, Female Preventive care refers to lifestyle choices and visits with your health care provider that can promote health and wellness. What does preventive care include?  A yearly physical exam. This is also called an annual well check.  Dental exams once or twice a year.  Routine eye exams. Ask your health care provider how often you  should have your eyes checked.  Personal lifestyle choices, including:  Daily care of your teeth and gums.  Regular physical activity.  Eating a healthy diet.  Avoiding tobacco and drug use.  Limiting alcohol use.  Practicing safe sex.  Taking low-dose aspirin every day.  Taking vitamin and mineral supplements as recommended by your health care provider. What happens during an annual well check? The services and screenings done by your health care provider during your annual well check will depend on your age, overall health, lifestyle risk factors, and family history of disease. Counseling  Your health care provider may ask you questions about your:  Alcohol use.  Tobacco use.  Drug use.  Emotional well-being.  Home and relationship well-being.  Sexual activity.  Eating habits.  History of falls.  Memory and ability to understand (cognition).  Work and work Statistician.  Reproductive health. Screening  You may have the following tests or measurements:  Height, weight, and BMI.  Blood pressure.  Lipid and cholesterol levels. These may be checked every 5 years, or more frequently if you are over 10 years old.  Skin check.  Lung cancer screening. You may have this screening every year starting at age 34 if you have a 30-pack-year history of smoking and currently smoke or have quit within the past 15 years.  Fecal occult blood test (FOBT) of the stool. You may have this test every year starting at age 40.  Flexible sigmoidoscopy or colonoscopy. You may have a sigmoidoscopy every 5 years or a colonoscopy every 10 years starting at age 51.  Hepatitis C blood test.  Hepatitis B blood test.  Sexually transmitted disease (STD) testing.  Diabetes screening. This is  done by checking your blood sugar (glucose) after you have not eaten for a while (fasting). You may have this done every 1-3 years.  Bone density scan. This is done to screen for osteoporosis.  You may have this done starting at age 104.  Mammogram. This may be done every 1-2 years. Talk to your health care provider about how often you should have regular mammograms. Talk with your health care provider about your test results, treatment options, and if necessary, the need for more tests. Vaccines  Your health care provider may recommend certain vaccines, such as:  Influenza vaccine. This is recommended every year.  Tetanus, diphtheria, and acellular pertussis (Tdap, Td) vaccine. You may need a Td booster every 10 years.  Zoster vaccine. You may need this after age 63.  Pneumococcal 13-valent conjugate (PCV13) vaccine. One dose is recommended after age 56.  Pneumococcal polysaccharide (PPSV23) vaccine. One dose is recommended after age 6. Talk to your health care provider about which screenings and vaccines you need and how often you need them. This information is not intended to replace advice given to you by your health care provider. Make sure you discuss any questions you have with your health care provider. Document Released: 10/21/2015 Document Revised: 06/13/2016 Document Reviewed: 07/26/2015 Elsevier Interactive Patient Education  2017 [place] Prevention in the Home Falls can cause injuries. They can happen to people of all ages. There are many things you can do to make your home safe and to help prevent falls. What can I do on the outside of my home?  Regularly fix the edges of walkways and driveways and fix any cracks.  Remove anything that might make you trip as you walk through a door, such as a raised step or threshold.  Trim any bushes or trees on the path to your home.  Use bright outdoor lighting.  Clear any walking paths of anything that might make someone trip, such as rocks or tools.  Regularly check to see if handrails are loose or broken. Make sure that both sides of any steps have handrails.  Any raised decks and porches should have  guardrails on the edges.  Have any leaves, snow, or ice cleared regularly.  Use sand or salt on walking paths during winter.  Clean up any spills in your garage right away. This includes oil or grease spills. What can I do in the bathroom?  Use night lights.  Install grab bars by the toilet and in the tub and shower. Do not use towel bars as grab bars.  Use non-skid mats or decals in the tub or shower.  If you need to sit down in the shower, use a plastic, non-slip stool.  Keep the floor dry. Clean up any water that spills on the floor as soon as it happens.  Remove soap buildup in the tub or shower regularly.  Attach bath mats securely with double-sided non-slip rug tape.  Do not have throw rugs and other things on the floor that can make you trip. What can I do in the bedroom?  Use night lights.  Make sure that you have a light by your bed that is easy to reach.  Do not use any sheets or blankets that are too big for your bed. They should not hang down onto the floor.  Have a firm chair that has side arms. You can use this for support while you get dressed.  Do not have throw rugs and other things  on the floor that can make you trip. What can I do in the kitchen?  Clean up any spills right away.  Avoid walking on wet floors.  Keep items that you use a lot in easy-to-reach places.  If you need to reach something above you, use a strong step stool that has a grab bar.  Keep electrical cords out of the way.  Do not use floor polish or wax that makes floors slippery. If you must use wax, use non-skid floor wax.  Do not have throw rugs and other things on the floor that can make you trip. What can I do with my stairs?  Do not leave any items on the stairs.  Make sure that there are handrails on both sides of the stairs and use them. Fix handrails that are broken or loose. Make sure that handrails are as long as the stairways.  Check any carpeting to make sure that  it is firmly attached to the stairs. Fix any carpet that is loose or worn.  Avoid having throw rugs at the top or bottom of the stairs. If you do have throw rugs, attach them to the floor with carpet tape.  Make sure that you have a light switch at the top of the stairs and the bottom of the stairs. If you do not have them, ask someone to add them for you. What else can I do to help prevent falls?  Wear shoes that:  Do not have high heels.  Have rubber bottoms.  Are comfortable and fit you well.  Are closed at the toe. Do not wear sandals.  If you use a stepladder:  Make sure that it is fully opened. Do not climb a closed stepladder.  Make sure that both sides of the stepladder are locked into place.  Ask someone to hold it for you, if possible.  Clearly mark and make sure that you can see:  Any grab bars or handrails.  First and last steps.  Where the edge of each step is.  Use tools that help you move around (mobility aids) if they are needed. These include:  Canes.  Walkers.  Scooters.  Crutches.  Turn on the lights when you go into a dark area. Replace any light bulbs as soon as they burn out.  Set up your furniture so you have a clear path. Avoid moving your furniture around.  If any of your floors are uneven, fix them.  If there are any pets around you, be aware of where they are.  Review your medicines with your doctor. Some medicines can make you feel dizzy. This can increase your chance of falling. Ask your doctor what other things that you can do to help prevent falls. This information is not intended to replace advice given to you by your health care provider. Make sure you discuss any questions you have with your health care provider. Document Released: 07/21/2009 Document Revised: 03/01/2016 Document Reviewed: 10/29/2014 Elsevier Interactive Patient Education  2017 [employer].

## 2020-03-23 NOTE — Progress Notes (Addendum)
Subjective:   Jenny Madden is a 79 y.o. female who presents for Medicare Annual (Subsequent) preventive examination.  Review of Systems:  No ROS. Medicare Wellness Visit Cardiac Risk Factors include: advanced age (>39mn, >>33women);diabetes mellitus;dyslipidemia;family history of premature cardiovascular disease;hypertension     Objective:     Vitals: BP 118/60   Pulse 67   Temp 98.3 F (36.8 C)   Resp 16   Ht _0  (1.626 m)   Wt 149 lb 9.6 oz (67.9 kg)   SpO2 98%   BMI 25.68 kg/m   Body mass index is 25.68 kg/m.  Advanced Directives 03/23/2020 12/29/2019 12/05/2019 02/05/2019 12/24/2018 04/24/2018 01/10/2018  Does Patient Have a Medical Advance Directive? Yes No No Yes No No No  Type of Advance Directive - - -Public librarianLiving will - - -  Does patient want to make changes to medical advance directive? No - Patient declined - - - - - -  Copy of HPress photographerin Chart? - - - No - copy requested - - -  Would patient like information on creating a medical advance directive? - No - Patient declined No - Patient declined - No - Patient declined - -    Tobacco Social History   Tobacco Use  Smoking Status Former Smoker   Packs/day: 0.50   Years: 30.00   Pack years: 15.00   Types: Cigarettes   Quit date: 10/09/1999   Years since quitting: 20.4  Smokeless Tobacco Never Used     Counseling given: No   Clinical Intake:  Pre-visit preparation completed: Yes  Pain : 0-10 Pain Score: 3  Pain Type: Acute pain Pain Location: Hip Pain Orientation: Right Pain Descriptors / Indicators: Aching, Nagging, Discomfort Pain Onset: More than a month ago Pain Frequency: Intermittent     Nutritional Risks: None Diabetes: Yes CBG done?: No Did pt. bring in CBG monitor from home?: No  How often do you need to have someone help you when you read instructions, pamphlets, or other written materials from your doctor or pharmacy?: 1 - Never What  is the last grade level you completed in school?: [employer]  Interpreter Needed?: No  Information entered by :: Mako Pelfrey N. HLowell Guitar LPN  Past Medical History:  Diagnosis Date   Allergic rhinitis, cause unspecified    Anxiety state, unspecified    none recent   Benign neoplasm of colon    [place]    ren[place] mass   Cataract    Chronic kidney disease 01/2016   Tumor left side   Diverticulosis of colon (without mention of hemorrhage)    Dysphagia    Family history of adverse reaction to anesthesia 3 yrs ago   slow to awaken    Family history of malignant neoplasm of gastrointestinal tract    Heart murmur    Hypertension    Nonspecific (abnormal) findings on radiological and other examination of other intrathoracic organs    Nonspecific abnormal electrocardiogram (ECG) (EKG)    Osteoporosis    Other dysphagia    Pure hypercholesterolemia    Type II or unspecified type diabetes mellitus without mention of complication, not stated as uncontrolled    Unspecified essential hypertension    Unspecified tinnitus years ago   Past Surgical History:  Procedure Laterality Date   ABLATION Left 01/2016   Renal mass   CATARACT EXTRACTION     CESAREAN SECTION     x 1   COLONOSCOPY  colonscopy and endoscopy  5 yrs ago   FOOT SURGERY     bilateral for hammer toes   IR GENERIC HISTORICAL  12/06/2015   IR RADIOLOGIST EVAL & MGMT 12/06/2015 Aletta Edouard, MD [facility] INTERV RAD   IR GENERIC HISTORICAL  05/03/2016   IR RADIOLOGIST EVAL & MGMT 05/03/2016 Aletta Edouard, MD [facility] INTERV RAD   IR GENERIC HISTORICAL  02/09/2016   IR RADIOLOGIST EVAL & MGMT 02/09/2016 [facility] INTERV RAD   IR RADIOLOGIST EVAL & MGMT  01/09/2017   IR RADIOLOGIST EVAL & MGMT  01/14/2018   IR RADIOLOGIST EVAL & MGMT  03/31/2019   UPPER GASTROINTESTINAL ENDOSCOPY     Family History  Problem Relation Age of Onset   Colon cancer Brother 1   Esophageal cancer Neg Hx    Rectal cancer Neg Hx    Stomach cancer Neg  Hx    Social History   Socioeconomic History   Marital status: Widowed    Spouse name: Jonathon Bellows x 46 yrs   Number of children: 1   Years of education: 14   Highest education level: High school graduate  Occupational History   Occupation: Retired  Tobacco Use   Smoking status: Former Smoker    Packs/day: 0.50    Years: 30.00    Pack years: 15.00    Types: Cigarettes    Quit date: 10/09/1999    Years since quitting: 20.4   Smokeless tobacco: Never Used  Scientific laboratory technician Use: Never used  Substance and Sexual Activity   Alcohol use: Yes    Alcohol/week: 1.0 standard drink    Types: 1 Standard drinks or equivalent per week    Comment: social use   Drug use: No   Sexual activity: Never    Comment: 1st intercourse- 21, partners- 3, widow   Other Topics Concern   Not on file  Social History Narrative   Widowed, Chiropodist x 44 yrs   1 child, daughter, 2 Financial planner school education   Live alone; 1 story home   Social Determinants of Health   Financial Resource Strain:    Difficulty of Paying Living Expenses:   Food Insecurity:    Worried About Charity fundraiser in the Last Year:    Arboriculturist in the Last Year:   Transportation Needs:    Film/video editor (Medical):    Lack of Transportation (Non-Medical):   Physical Activity:    Days of Exercise per Week:    Minutes of Exercise per Session:   Stress:    Feeling of Stress :   Social Connections:    Frequency of Communication with Friends and Family:    Frequency of Social Gatherings with Friends and Family:    Attends Religious Services:    Active Member of Clubs or Organizations:    Attends Archivist Meetings:    Marital Status:     Outpatient Encounter Medications as of 03/23/2020  Medication Sig   acetaminophen (TYLENOL) 325 MG tablet Take 2 tablets (650 mg total) by mouth every 6 (six) hours as needed for moderate pain.   amLODipine (NORVASC) 10 MG tablet TAKE 1 TABLET DAILY    aspirin 81 MG tablet Take 81 mg by mouth every morning.    atorvastatin (LIPITOR) 20 MG tablet Take 1 tablet (20 mg total) by mouth daily.   blood glucose meter kit and supplies Dispense based on patient and insurance preference. Use up to four times daily  as directed. (E11.9).   Blood Glucose Monitoring Suppl (ONE TOUCH ULTRA 2) w/Device KIT USE TO CHECK BLOOD SUGAR   TWO TIMES A DAY   Cholecalciferol (VITAMIN D) 2000 UNITS CAPS Take 2,000 Units by mouth daily.    famotidine (PEPCID) 40 MG tablet Take 1 tablet (40 mg total) by mouth at bedtime.   fexofenadine (ALLEGRA) 180 MG tablet Take 1 tablet (180 mg total) by mouth daily. Take 1 tablet by mouth once daily as needed for allergies   fluticasone (FLONASE) 50 MCG/ACT nasal spray Place 2 sprays into both nostrils daily.   ondansetron (ZOFRAN ODT) 4 MG disintegrating tablet Take 1 tablet (4 mg total) by mouth every 8 (eight) hours as needed for nausea or vomiting.   ONE TOUCH LANCETS MISC Used to check blood sugars twice daily   ONETOUCH ULTRA test strip CHECK BLOOD SUGAR TWO TIMESA DAY   pantoprazole (PROTONIX) 40 MG tablet Take 1 tablet (40 mg total) by mouth 2 (two) times daily before a meal.   No facility-administered encounter medications on file as of 03/23/2020.    Activities of Daily Living In your present state of health, do you have any difficulty performing the following activities: 03/23/2020  Hearing? N  Vision? N  Difficulty concentrating or making decisions? N  Walking or climbing stairs? N  Dressing or bathing? N  Doing errands, shopping? N  Preparing Food and eating ? N  Using the Toilet? N  In the past six months, have you accidently leaked urine? N  Do you have problems with loss of bowel control? N  Managing your Medications? N  Managing your Finances? N  Housekeeping or managing your Housekeeping? N  Some recent data might be hidden    Patient Care Team: Hoyt Koch, MD as PCP - General (Internal  Medicine) Milus Banister, MD as Attending Physician (Gastroenterology) Konrad Felix, MD as Referring Physician (Ophthalmology) Thamas Jaegers, RMA    Assessment:   This is a routine wellness examination for Jenny Madden.  Exercise Activities and Dietary recommendations Current Exercise Habits: Home exercise routine, Type of exercise: walking, Time (Minutes): 25 (brisk walking; do 1 mile a day), Frequency (Times/Week): 7, Weekly Exercise (Minutes/Week): 175, Intensity: Moderate, Exercise limited by: None identified  Goals       Patient Stated      Continue to stay as healthy and as independent as possible by continuing to be physically and socially active.       Patient Stated (pt-stated)      Continue to walk and be active        Fall Risk Fall Risk  03/23/2020 03/17/2020 02/05/2019 09/10/2018 09/03/2017  Falls in the past year? 0 0 0 0 No  Number falls in past yr: 0 - 0 - -  Injury with Fall? 0 - - - -  Risk for fall due to : No Fall Risks - - - -  Follow up Falls evaluation completed;Education provided - Falls prevention discussed - -   Is the patient's home free of loose throw rugs in walkways, pet beds, electrical cords, etc?   yes      Grab bars in the bathroom? yes      Handrails on the stairs?   yes      Adequate lighting?   yes  Timed Get Up and Go performed: not indicated  Depression Screen PHQ 2/9 Scores 03/23/2020 03/17/2020 02/05/2019 09/10/2018  PHQ - 2 Score 0 0 1 0  Cognitive Function     6CIT Screen 03/23/2020  What Year? 0 points  What month? 0 points  What time? 0 points  Count back from 20 0 points  Months in reverse 0 points  Repeat phrase 0 points  Total Score 0    Immunization History  Administered Date(s) Administered   Fluad Quad(high Dose 65+) 06/11/2019   H1N1 09/30/2008   Influenza Split 06/28/2011, 07/02/2012   Influenza Whole 06/30/2008, 07/15/2009, 06/28/2010   Influenza, High Dose Seasonal PF 06/21/2016, 07/05/2017    Influenza,inj,Quad PF,6+ Mos 07/07/2013, 07/09/2014, 07/13/2015   Influenza-Unspecified 06/02/2018   PFIZER SARS-COV-2 Vaccination 11/13/2019, 12/08/2019   Pneumococcal Conjugate-13 01/12/2015   Pneumococcal Polysaccharide-23 06/28/2009   Td 12/28/2009    Qualifies for Shingles Vaccine? Yes, will check with local pharmacy  Screening Tests Health Maintenance  Topic Date Due   Hepatitis C Screening  Never done   TETANUS/TDAP  12/29/2019   INFLUENZA VACCINE  05/08/2020   OPHTHALMOLOGY EXAM  06/28/2020   FOOT EXAM  09/15/2020   URINE MICROALBUMIN  09/15/2020   HEMOGLOBIN A1C  09/16/2020   COLONOSCOPY  11/07/2021   DEXA SCAN  Completed   COVID-19 Vaccine  Completed   PNA vac Low Risk Adult  Completed    Cancer Screenings: Lung: Low Dose CT Chest recommended if Age 37-80 years, 30 pack-year currently smoking OR have quit w/in 15years. Patient does not qualify. Breast:  Up to date on Mammogram? Yes   Up to date of Bone Density/Dexa? Yes Colorectal: Yes  Additional Screenings: Hepatitis C Screening: never done    Plan:     Reviewed health maintenance screenings with patient today and relevant education, vaccines, and/or referrals were provided.    Continue doing brain stimulating activities (puzzles, reading, adult coloring books, staying active) to keep memory sharp.    Continue to eat heart healthy diet (full of fruits, vegetables, whole grains, lean protein, water--limit salt, fat, and sugar intake) and increase physical activity as tolerated.   I have personally reviewed and noted the following in the patient's chart:   Medical and social history Use of alcohol, tobacco or illicit drugs  Current medications and supplements Functional ability and status Nutritional status Physical activity Advanced directives List of other physicians Hospitalizations, surgeries, and ER visits in previous 12 months Vitals Screenings to include cognitive, depression, and  falls Referrals and appointments  In addition, I have reviewed and discussed with patient certain preventive protocols, quality metrics, and best practice recommendations. A written personalized care plan for preventive services as well as general preventive health recommendations were provided to patient.     Sheral Flow, LPN  0/07/711  Nurse Health Advisor  Medical screening examination/treatment/procedure(s) were performed by non-physician practitioner and as supervising physician I was immediately available for consultation/collaboration.  I agree with above. Lew Dawes, MD

## 2020-03-25 ENCOUNTER — Other Ambulatory Visit: Payer: Self-pay

## 2020-03-25 ENCOUNTER — Telehealth: Payer: Self-pay | Admitting: Internal Medicine

## 2020-03-25 ENCOUNTER — Ambulatory Visit (HOSPITAL_COMMUNITY)
Admission: RE | Admit: 2020-03-25 | Discharge: 2020-03-25 | Disposition: A | Discharge status: Home / Self Care | Payer: Medicare HMO | Source: Ambulatory Visit | Attending: Interventional Radiology | Admitting: Interventional Radiology

## 2020-03-25 DIAGNOSIS — N2889 Other specified disorders of kidney and ureter: Secondary | ICD-10-CM | POA: Diagnosis present

## 2020-03-25 DIAGNOSIS — C642 Malignant neoplasm of left kidney, except renal pelvis: Secondary | ICD-10-CM | POA: Diagnosis not present

## 2020-03-25 LAB — POCT I-STAT CREATININE: Creatinine, Ser: 1.2 mg/dL — ABNORMAL HIGH (ref 0.44–1.00)

## 2020-03-25 MED ORDER — IOHEXOL 300 MG/ML  SOLN
100.0000 mL | Freq: Once | INTRAMUSCULAR | Status: AC | PRN
  Administered 2020-03-25: 100 mL via INTRAVENOUS

## 2020-03-25 MED ORDER — SODIUM CHLORIDE (PF) 0.9 % IJ SOLN
INTRAMUSCULAR | Status: AC
  Filled 2020-03-25: qty 50

## 2020-03-25 NOTE — Telephone Encounter (Signed)
    Patient wants to know how long should she stay on pantoprazole (PROTONIX) 40 MG tablet She is concerned about long term use; kidney damage

## 2020-03-25 NOTE — Telephone Encounter (Signed)
Gi would be the one to ask but given the findings on most recent study I would recommend likely lifelong.

## 2020-03-25 NOTE — Telephone Encounter (Signed)
Called pt, LVM.   

## 2020-03-29 ENCOUNTER — Ambulatory Visit: Payer: Medicare HMO | Admitting: Internal Medicine

## 2020-03-30 ENCOUNTER — Encounter: Payer: Self-pay | Admitting: *Deleted

## 2020-03-30 ENCOUNTER — Other Ambulatory Visit: Payer: Self-pay

## 2020-03-30 ENCOUNTER — Ambulatory Visit
Admission: RE | Admit: 2020-03-30 | Discharge: 2020-03-30 | Disposition: A | Discharge status: Home / Self Care | Payer: Medicare HMO | Source: Ambulatory Visit | Attending: Interventional Radiology | Admitting: Interventional Radiology

## 2020-03-30 DIAGNOSIS — D49512 Neoplasm of unspecified behavior of left kidney: Secondary | ICD-10-CM | POA: Diagnosis not present

## 2020-03-30 DIAGNOSIS — Z9889 Other specified postprocedural states: Secondary | ICD-10-CM | POA: Diagnosis not present

## 2020-03-30 DIAGNOSIS — N2889 Other specified disorders of kidney and ureter: Secondary | ICD-10-CM

## 2020-03-30 HISTORY — PX: IR RADIOLOGIST EVAL & MGMT: IMG5224

## 2020-03-30 NOTE — Progress Notes (Signed)
Chief Complaint: Patient was consulted remotely today (TeleHealth) for follow up after ablation of a left renal mass.  History of Present Illness: Jenny Madden is a 79 y.o. female status post percutaneous cryoablation of a left renal mass on 01/18/2016. Jenny Madden has been doing well and has no complaints. She denies any urinary symptoms.  She is status post endoscopic dilatation of a stricture related to a Schatzki's ring at the GE junction in March by Dr. Ardis Hughs.  Symptoms of dysphagia improved after the procedure.  Past Medical History:  Diagnosis Date   Allergic rhinitis, cause unspecified    Anxiety state, unspecified    none recent   Benign neoplasm of colon    Cancer ([place])    renal mass   Cataract    Chronic kidney disease 01/2016   Tumor left side   Diverticulosis of colon (without mention of hemorrhage)    Dysphagia    Family history of adverse reaction to anesthesia 3 yrs ago   slow to awaken    Family history of malignant neoplasm of gastrointestinal tract    Heart murmur    Hypertension    Nonspecific (abnormal) findings on radiological and other examination of other intrathoracic organs    Nonspecific abnormal electrocardiogram (ECG) (EKG)    Osteoporosis    Other dysphagia    Pure hypercholesterolemia    Type II or unspecified type diabetes mellitus without mention of complication, not stated as uncontrolled    Unspecified essential hypertension    Unspecified tinnitus years ago    Past Surgical History:  Procedure Laterality Date   ABLATION Left 01/2016   Renal mass   CATARACT EXTRACTION     CESAREAN SECTION     x 1   COLONOSCOPY     colonscopy and endoscopy  5 yrs ago   FOOT SURGERY     bilateral for hammer toes   IR GENERIC HISTORICAL  12/06/2015   IR RADIOLOGIST EVAL & MGMT 12/06/2015 Aletta Edouard, MD [facility] INTERV RAD   IR GENERIC HISTORICAL  05/03/2016   IR RADIOLOGIST EVAL & MGMT 05/03/2016 Aletta Edouard,  MD [facility] INTER[facility] RAD   IR GENERIC HISTORICAL  02/09/2016   IR RADIOLOGIST EVAL & MGMT 02/09/2016 [facility] INTER[facility]   IR RADIOLOGIST EVAL & MGMT  01/09/2017   IR RADIOLOGIST EVAL & MGMT  01/14/2018   IR RADIOLOGIST EVAL & MGMT  03/31/2019   UPPER GASTROINTESTINAL ENDOSCOPY      Allergies: Patient has no known allergies.  Medications: Prior to Admission medications   Medication Sig Start Date End Date Taking? Authorizing Provider  acetaminophen (TYLENOL) 325 MG tablet Take 2 tablets (650 mg total) by mouth every 6 (six) hours as needed for moderate pain. 12/24/18   Nils Flack, Mina A, PA-C  amLODipine (NORVASC) 10 MG tablet TAKE 1 TABLET DAILY 12/01/19   Hoyt Koch, MD  aspirin 81 MG tablet Take 81 mg by mouth every morning.     [provider]  atorvastatin (LIPITOR) 20 MG tablet Take 1 tablet (20 mg total) by mouth daily. 09/28/19   Hoyt Koch, MD  blood glucose meter kit and supplies Dispense based on patient and insurance preference. Use up to four times daily as directed. (E11.9). 05/16/18   Hoyt Koch, MD  Blood Glucose Monitoring Suppl (ONE TOUCH ULTRA 2) w/Device KIT USE TO CHECK BLOOD SUGAR   TWO TIMES A DAY 02/09/20   Hoyt Koch, MD  Cholecalciferol (VITAMIN D) 2000 UNITS  CAPS Take 2,000 Units by mouth daily.     [provider]  famotidine (PEPCID) 40 MG tablet Take 1 tablet (40 mg total) by mouth at bedtime. 02/02/20   Milus Banister, MD  fexofenadine (ALLEGRA) 180 MG tablet Take 1 tablet (180 mg total) by mouth daily. Take 1 tablet by mouth once daily as needed for allergies 07/14/18   Hoyt Koch, MD  fluticasone [facility]) 50 MCG/ACT nasal spray Place 2 sprays into both nostrils daily. 09/16/19   Hoyt Koch, MD  ondansetron (ZOFRAN ODT) 4 MG disintegrating tablet Take 1 tablet (4 mg total) by mouth every 8 (eight) hours as needed for nausea or vomiting. 12/30/19   Horton, Barbette Hair, MD  ONE TOUCH LANCETS MISC  Used to check blood sugars twice daily 05/26/18   Hoyt Koch, MD  [facility] ULTRA test strip CHECK BLOOD SUGAR TWO TIMESA DAY 02/05/20   Hoyt Koch, MD  pantoprazole (PROTONIX) 40 MG tablet Take 1 tablet (40 mg total) by mouth 2 (two) times daily before a meal. 03/17/20   Hoyt Koch, MD     Family History  Problem Relation Age of Onset   Colon cancer Brother 51   Esophageal cancer Neg Hx    Rectal cancer Neg Hx    Stomach cancer Neg Hx     Social History   Socioeconomic History   Marital status: Widowed    Spouse name: Jonathon Bellows x 46 yrs   Number of children: 1   Years of education: 14   Highest education level: High school graduate  Occupational History   Occupation: Retired  Tobacco Use   Smoking status: Former Smoker    Packs/day: 0.50    Years: 30.00    Pack years: 15.00    Types: Cigarettes    Quit date: 10/09/1999    Years since quitting: 20.4   Smokeless tobacco: Never Used  Scientific laboratory technician Use: Never used  Substance and Sexual Activity   Alcohol use: Yes    Alcohol/week: 1.0 standard drink    Types: 1 Standard drinks or equivalent per week    Comment: social use   Drug use: No   Sexual activity: Never    Comment: 1st intercourse- 21, partners- 3, widow   Other Topics Concern   Not on file  Social History Narrative   Widowed, Chiropodist x 65 yrs   1 child, daughter, 2 Financial planner school education   Live alone; 1 story home   Social Determinants of Health   Financial Resource Strain:    Difficulty of Paying Living Expenses:   Food Insecurity:    Worried About Charity fundraiser in the Last Year:    Arboriculturist in the Last Year:   Transportation Needs:    Film/video editor (Medical):    Lack of Transportation (Non-Medical):   Physical Activity:    Days of Exercise per Week:    Minutes of Exercise per Session:   Stress:    Feeling of Stress :   Social Connections:    Frequency of  Communication with Friends and Family:    Frequency of Social Gatherings with Friends and Family:    Attends Religious Services:    Active Member of Clubs or Organizations:    Attends Archivist Meetings:    Marital Status:     Review of Systems  Constitutional: Negative.   Respiratory: Negative.   Cardiovascular: Negative.  Gastrointestinal: Negative.   Genitourinary: Negative.   Musculoskeletal: Negative.   Neurological: Negative.     Review of Systems: A 12 point ROS discussed and pertinent positives are indicated in the HPI above.  All other systems are negative.  Physical Exam No direct physical exam was performed (except for noted visual exam findings with Video Visits).   Vital Signs: There were no vitals taken for this visit.  Imaging: CT ABDOMEN W WO CONTRAST  Result Date: 03/25/2020 CLINICAL DATA:  Left renal cell carcinoma. Status post cryoablation 01/18/2016. EXAM: CT ABDOMEN WITHOUT AND WITH CONTRAST TECHNIQUE: Multidetector CT imaging of the abdomen was performed following the standard protocol before and following the bolus administration of intravenous contrast. CONTRAST:  124m OMNIPAQUE IOHEXOL 300 MG/ML  SOLN COMPARISON:  03/19/2019 FINDINGS: Lower chest: Unremarkable Hepatobiliary: No suspicious focal abnormality within the liver parenchyma. There is no evidence for gallstones, gallbladder wall thickening, or pericholecystic fluid. No intrahepatic or extrahepatic biliary dilation. Pancreas: No focal mass lesion. No dilatation of the main duct. No intraparenchymal cyst. No peripancreatic edema. Spleen: No splenomegaly. No focal mass lesion. Adrenals/Urinary Tract: No adrenal nodule or mass. Right kidney unremarkable. Posterior interpolar ablation defect again identified left kidney. Appearance is stable in the interval with scar measuring 1.1 x 0.8 cm today compared to 1.2 x 0.8 cm previously. No suspicious/abnormal enhancement or progressive soft  tissue to suggest local recurrence. Stable dominant exophytic upper pole cyst measuring 5.9 cm today. 11 mm subcapsular cyst in the lower pole left kidney is also unchanged. Stomach/Bowel: Stomach is unremarkable. No gastric wall thickening. No evidence of outlet obstruction. Duodenum is normally positioned as is the ligament of Treitz. Duodenal diverticulum noted. No small bowel or colonic dilatation within the visualized abdomen. Scattered diverticular changes noted in the abdominal segments of the colon. Vascular/Lymphatic: There is abdominal aortic atherosclerosis without aneurysm. Small gastrohepatic ligament lymph nodes are stable. There is no gastrohepatic or hepatoduodenal ligament lymphadenopathy. No retroperitoneal or mesenteric lymphadenopathy. Other: No intraperitoneal free fluid. Musculoskeletal: No worrisome lytic or sclerotic osseous abnormality. IMPRESSION: 1. Stable exam. No new or progressive findings to suggest local recurrence or metastatic disease in the abdomen. 2. Left renal cysts. 3. Aortic Atherosclerosis (ICD10-I70.0). Electronically Signed   By: EMisty StanleyM.D.   On: 03/25/2020 10:03    Labs:  CBC: Recent Labs    09/16/19 1050 12/05/19 0326 12/29/19 2312  WBC 6.9 6.7 7.3  HGB 13.4 13.4 13.1  HCT 41.5 44.5 43.2  PLT 249.0 276 283    COAGS: Recent Labs    12/05/19 0326  INR 1.0    BMP: Recent Labs    04/01/19 1041 04/01/19 1041 09/16/19 1050 12/05/19 0326 12/29/19 2312 03/25/20 0900  NA 140  --  139 142 141  --   K 4.0  --  3.9 4.2 3.7  --   CL 106  --  104 107 107  --   CO2 25  --  _0 --   GLUCOSE 95  --  96 117* 107*  --   BUN 12  --  _1 --   CALCIUM 9.5  --  10.2 9.9 9.9  --   CREATININE 1.04   < > 1.08 1.15* 1.12* 1.20*  GFRNONAA  --   --   --  45* 47*  --   GFRAA  --   --   --  52* 54*  --    < > = values in this interval  not displayed.    LIVER FUNCTION TESTS: Recent Labs    04/01/19 1041 09/16/19 1050 12/29/19 2312    BILITOT 0.3 0.3 0.5  AST _0 ALT _1 ALKPHOS 70 77 80  PROT 7.5 8.2 7.8  ALBUMIN 4.2 4.5 4.2     Assessment and Plan:  I reviewed a follow-up CT exam from 03/25/2020 with Mrs. Cheuvront.  This demonstrates a stable to slightly smaller ablation defect along the medial aspect of the interpolar left kidney with no evidence of enhancement to suggest recurrent tumor.  No new renal lesions are identified.  There is no evidence of tumor recurrence 4 years after cryoablation of the left renal tumor which initially measured approximately 1.6 cm.  I recommended that we discontinue routine follow-up imaging given appearance of the ablation defect by CT.  I recommended continued routine health maintenance with Dr. Sharlet Salina and her other providers.  Electronically Signed: Azzie Roup 03/30/2020, 9:08 AM     I spent a total of 10 Minutes in remote  clinical consultation, greater than 50% of which was counseling/coordinating care status post ablation of a left renal mass.    Visit type: Audio and video Neurosurgeon).   Alternative for in-person consultation at [facility], [address], [place], [place]. This visit type was conducted due to national recommendations for restrictions regarding the COVID-19 Pandemic (e.g. social distancing).  This format is felt to be most appropriate for this patient at this time.  All issues noted in this document were discussed and addressed.

## 2020-04-01 NOTE — Progress Notes (Signed)
Patient ID: Jenny Madden, female   DOB: 16-Oct-1940, 79 y.o.   MRN: [id number] Medical screening examination/treatment/procedure(s) were performed by non-physician practitioner and as supervising physician I was immediately available for consultation/collaboration. I agree with above. Cathlean Cower, MD

## 2020-04-08 DIAGNOSIS — D49512 Neoplasm of unspecified behavior of left kidney: Secondary | ICD-10-CM | POA: Diagnosis not present

## 2020-04-26 DIAGNOSIS — R69 Illness, unspecified: Secondary | ICD-10-CM | POA: Diagnosis not present

## 2020-05-10 ENCOUNTER — Telehealth: Payer: Self-pay

## 2020-05-10 NOTE — Telephone Encounter (Signed)
New message    [facility] calling to clarify prescription famotidine (PEPCID) 40 MG tablet

## 2020-05-13 DIAGNOSIS — Z809 Family history of malignant neoplasm, unspecified: Secondary | ICD-10-CM | POA: Diagnosis not present

## 2020-05-13 DIAGNOSIS — E785 Hyperlipidemia, unspecified: Secondary | ICD-10-CM | POA: Diagnosis not present

## 2020-05-13 DIAGNOSIS — Z8249 Family history of ischemic heart disease and other diseases of the circulatory system: Secondary | ICD-10-CM | POA: Diagnosis not present

## 2020-05-13 DIAGNOSIS — J302 Other seasonal allergic rhinitis: Secondary | ICD-10-CM | POA: Diagnosis not present

## 2020-05-13 DIAGNOSIS — I10 Essential (primary) hypertension: Secondary | ICD-10-CM | POA: Diagnosis not present

## 2020-05-13 DIAGNOSIS — K08409 Partial loss of teeth, unspecified cause, unspecified class: Secondary | ICD-10-CM | POA: Diagnosis not present

## 2020-05-13 DIAGNOSIS — G3184 Mild cognitive impairment, so stated: Secondary | ICD-10-CM | POA: Diagnosis not present

## 2020-05-13 DIAGNOSIS — R32 Unspecified urinary incontinence: Secondary | ICD-10-CM | POA: Diagnosis not present

## 2020-05-13 DIAGNOSIS — M1990 Unspecified osteoarthritis, unspecified site: Secondary | ICD-10-CM | POA: Diagnosis not present

## 2020-05-13 DIAGNOSIS — K219 Gastro-esophageal reflux disease without esophagitis: Secondary | ICD-10-CM | POA: Diagnosis not present

## 2020-06-09 ENCOUNTER — Other Ambulatory Visit: Payer: Self-pay | Admitting: Internal Medicine

## 2020-06-12 ENCOUNTER — Encounter: Payer: Self-pay | Admitting: Internal Medicine

## 2020-06-14 ENCOUNTER — Other Ambulatory Visit: Payer: Self-pay | Admitting: Internal Medicine

## 2020-06-14 DIAGNOSIS — R69 Illness, unspecified: Secondary | ICD-10-CM | POA: Diagnosis not present

## 2020-06-16 ENCOUNTER — Encounter: Payer: Self-pay | Admitting: Obstetrics & Gynecology

## 2020-06-16 ENCOUNTER — Other Ambulatory Visit: Payer: Self-pay

## 2020-06-16 ENCOUNTER — Ambulatory Visit (INDEPENDENT_AMBULATORY_CARE_PROVIDER_SITE_OTHER): Payer: Medicare HMO | Admitting: Obstetrics & Gynecology

## 2020-06-16 VITALS — BP 124/70 | Ht 63.5 in | Wt 148.0 lb

## 2020-06-16 DIAGNOSIS — Z01419 Encounter for gynecological examination (general) (routine) without abnormal findings: Secondary | ICD-10-CM | POA: Diagnosis not present

## 2020-06-16 DIAGNOSIS — Z780 Asymptomatic menopausal state: Secondary | ICD-10-CM

## 2020-06-16 DIAGNOSIS — M8589 Other specified disorders of bone density and structure, multiple sites: Secondary | ICD-10-CM

## 2020-06-16 NOTE — Progress Notes (Signed)
Jenny Madden 1941/07/05 [id number]   History:    79 y.o. G3P1A2L1 Widowed x 3 yrs.  RP:  Established patient presenting for annual gyn exam   HPI: Menopause, well on no hormone replacement therapy.  No postmenopausal bleeding.  No pelvic pain.  Abstinent.  Urine and bowel movements normal.  Breasts normal.  Body mass index improved to 25.81.  Walking.  Health labs with family physician. [place] 2018.  Past medical history,surgical history, family history and social history were all reviewed and documented in the EPIC chart.  Gynecologic History No LMP recorded. Patient is postmenopausal.  Obstetric History OB History  Gravida Para Term Preterm AB Living  3 1 1   2 1   SAB TAB Ectopic Multiple Live Births          1    # Outcome Date GA Lbr Len/2nd Weight Sex Delivery Anes PTL Lv  3 AB           2 AB           1 Term     F CS-Unspec  N LIV     ROS: A ROS was performed and pertinent positives and negatives are included in the history.  GENERAL: No fevers or chills. HEENT: No change in vision, no earache, sore throat or sinus congestion. NECK: No pain or stiffness. CARDIOVASCULAR: No chest pain or pressure. No palpitations. PULMONARY: No shortness of breath, cough or wheeze. GASTROINTESTINAL: No abdominal pain, nausea, vomiting or diarrhea, melena or bright red blood per rectum. GENITOURINARY: No urinary frequency, urgency, hesitancy or dysuria. MUSCULOSKELETAL: No joint or muscle pain, no back pain, no recent trauma. DERMATOLOGIC: No rash, no itching, no lesions. ENDOCRINE: No polyuria, polydipsia, no heat or cold intolerance. No recent change in weight. HEMATOLOGICAL: No anemia or easy bruising or bleeding. NEUROLOGIC: No headache, seizures, numbness, tingling or weakness. PSYCHIATRIC: No depression, no loss of interest in normal activity or change in sleep pattern.     Exam:   BP 124/70   Ht 5' 3.5" (1.613 m)   Wt 148 lb (67.1 kg)   BMI 25.81 kg/m   Body mass index  is 25.81 kg/m.  General appearance : Well developed well nourished female. No acute distress HEENT: Eyes: no retinal hemorrhage or exudates,  Neck supple, trachea midline, no carotid bruits, no thyroidmegaly Lungs: Clear to auscultation, no rhonchi or wheezes, or rib retractions  Heart: Regular rate and rhythm, no murmurs or gallops Breast:Examined in sitting and supine position were symmetrical in appearance, no palpable masses or tenderness,  no skin retraction, no nipple inversion, no nipple discharge, no skin discoloration, no axillary or supraclavicular lymphadenopathy Abdomen: no palpable masses or tenderness, no rebound or guarding Extremities: no edema or skin discoloration or tenderness  Pelvic: Vulva: Normal             Vagina: No gross lesions or discharge  Cervix: No gross lesions or discharge  Uterus  AV, normal size, shape and consistency, non-tender and mobile  Adnexa  Without masses or tenderness  Anus: Normal   Assessment/Plan:  79 y.o. female for annual exam   1. Well female exam with routine gynecological exam Normal gynecologic exam in menopause.  Pap test negative in 2019, no indication to repeat a Pap test.  Breast exam normal.  Screening mammogram November 2020 was negative.  Colonoscopy in 2018.  Health labs with family physician.  Good body mass index at 25.81.  Continue with walking and healthy nutrition.  2. Postmenopause Post menopause.  Well on no hormone replacement therapy.  No postmenopausal bleeding.  3. Osteopenia of multiple sites Due for a bone density.  Will schedule through her family physician at [facility].  Vitamin D supplements, calcium intake of 1500 mg daily and regular weightbearing physical activities.  Princess Bruins MD, 10:01 AM 06/16/2020

## 2020-06-24 DIAGNOSIS — R69 Illness, unspecified: Secondary | ICD-10-CM | POA: Diagnosis not present

## 2020-06-29 ENCOUNTER — Ambulatory Visit: Payer: Medicare HMO

## 2020-06-30 DIAGNOSIS — H02826 Cysts of left eye, unspecified eyelid: Secondary | ICD-10-CM | POA: Diagnosis not present

## 2020-06-30 DIAGNOSIS — H40023 Open angle with borderline findings, high risk, bilateral: Secondary | ICD-10-CM | POA: Diagnosis not present

## 2020-06-30 DIAGNOSIS — Z961 Presence of intraocular lens: Secondary | ICD-10-CM | POA: Diagnosis not present

## 2020-06-30 DIAGNOSIS — E119 Type 2 diabetes mellitus without complications: Secondary | ICD-10-CM | POA: Diagnosis not present

## 2020-07-11 ENCOUNTER — Telehealth: Payer: Self-pay | Admitting: Cardiovascular Disease

## 2020-07-11 NOTE — Telephone Encounter (Signed)
Jenny Blanks, MD  02/18/2020 10:53 AM EDT     Her heart is strong. Her heart muscle is thicker than normal and this creates her murmur. Her valves are ok. She was feeling well when I saw her so no further workup at this time. [name]     The patient has been notified of the result and verbalized understanding.  All questions (if any) were answered. Nuala Alpha, LPN [id number]/[id number] 9:48 PM

## 2020-07-11 NOTE — Telephone Encounter (Signed)
Pt would like a Nurse to go over the results of her Echocardiogram with her. The patient said Dr. Angelena Form mentioned something about the wall of her heart but she would like more clarification. Please advise

## 2020-07-14 ENCOUNTER — Other Ambulatory Visit: Payer: Self-pay | Admitting: Internal Medicine

## 2020-07-14 DIAGNOSIS — Z1231 Encounter for screening mammogram for malignant neoplasm of breast: Secondary | ICD-10-CM

## 2020-07-19 DIAGNOSIS — R69 Illness, unspecified: Secondary | ICD-10-CM | POA: Diagnosis not present

## 2020-07-26 DIAGNOSIS — R69 Illness, unspecified: Secondary | ICD-10-CM | POA: Diagnosis not present

## 2020-08-11 ENCOUNTER — Telehealth: Payer: Self-pay | Admitting: Internal Medicine

## 2020-08-11 NOTE — Telephone Encounter (Signed)
Left message for patient with recommendations.

## 2020-08-11 NOTE — Telephone Encounter (Signed)
Should take medication 30 min prior to a meal.   Ok to take pepto bismol on occasion, but if she needs it on a regular basis we need to re-evaluate her current medication

## 2020-08-11 NOTE — Telephone Encounter (Signed)
pantoprazole (PROTONIX) 40 MG tablet Patient says when she takes this before she eats once she eats it makes her have acid reflux and she was wondering if it was okay to take pepto bismol when that happens. Patient # [phone]

## 2020-08-23 ENCOUNTER — Encounter (HOSPITAL_COMMUNITY): Payer: Self-pay

## 2020-08-23 ENCOUNTER — Encounter: Payer: Self-pay | Admitting: Internal Medicine

## 2020-08-23 ENCOUNTER — Other Ambulatory Visit: Payer: Self-pay

## 2020-08-23 ENCOUNTER — Emergency Department (HOSPITAL_COMMUNITY)
Admission: EM | Admit: 2020-08-23 | Discharge: 2020-08-23 | Disposition: A | Discharge status: Home / Self Care | Payer: Medicare HMO | Attending: Emergency Medicine | Admitting: Emergency Medicine

## 2020-08-23 DIAGNOSIS — R110 Nausea: Secondary | ICD-10-CM

## 2020-08-23 DIAGNOSIS — Z79899 Other long term (current) drug therapy: Secondary | ICD-10-CM | POA: Diagnosis not present

## 2020-08-23 DIAGNOSIS — Z87891 Personal history of nicotine dependence: Secondary | ICD-10-CM | POA: Insufficient documentation

## 2020-08-23 DIAGNOSIS — E119 Type 2 diabetes mellitus without complications: Secondary | ICD-10-CM | POA: Insufficient documentation

## 2020-08-23 DIAGNOSIS — Z7982 Long term (current) use of aspirin: Secondary | ICD-10-CM | POA: Diagnosis not present

## 2020-08-23 DIAGNOSIS — I129 Hypertensive chronic kidney disease with stage 1 through stage 4 chronic kidney disease, or unspecified chronic kidney disease: Secondary | ICD-10-CM | POA: Diagnosis not present

## 2020-08-23 DIAGNOSIS — N189 Chronic kidney disease, unspecified: Secondary | ICD-10-CM | POA: Insufficient documentation

## 2020-08-23 LAB — URINALYSIS, ROUTINE W REFLEX MICROSCOPIC
Bilirubin Urine: NEGATIVE
Glucose, UA: NEGATIVE mg/dL
Hgb urine dipstick: NEGATIVE
Ketones, ur: NEGATIVE mg/dL
Leukocytes,Ua: NEGATIVE
Nitrite: NEGATIVE
Protein, ur: NEGATIVE mg/dL
Specific Gravity, Urine: 1.003 — ABNORMAL LOW (ref 1.005–1.030)
pH: 7 (ref 5.0–8.0)

## 2020-08-23 LAB — CBC
HCT: 43.4 % (ref 36.0–46.0)
Hemoglobin: 13.3 g/dL (ref 12.0–15.0)
MCH: 26.9 pg (ref 26.0–34.0)
MCHC: 30.6 g/dL (ref 30.0–36.0)
MCV: 87.7 fL (ref 80.0–100.0)
Platelets: 254 10*3/uL (ref 150–400)
RBC: 4.95 MIL/uL (ref 3.87–5.11)
RDW: 13 % (ref 11.5–15.5)
WBC: 7.2 10*3/uL (ref 4.0–10.5)
nRBC: 0 % (ref 0.0–0.2)

## 2020-08-23 LAB — COMPREHENSIVE METABOLIC PANEL
ALT: 15 U/L (ref 0–44)
AST: 23 U/L (ref 15–41)
Albumin: 4.7 g/dL (ref 3.5–5.0)
Alkaline Phosphatase: 81 U/L (ref 38–126)
Anion gap: 10 (ref 5–15)
BUN: 14 mg/dL (ref 8–23)
CO2: 26 mmol/L (ref 22–32)
Calcium: 9.8 mg/dL (ref 8.9–10.3)
Chloride: 104 mmol/L (ref 98–111)
Creatinine, Ser: 1 mg/dL (ref 0.44–1.00)
GFR, Estimated: 57 mL/min — ABNORMAL LOW (ref >60)
Glucose, Bld: 104 mg/dL — ABNORMAL HIGH (ref 70–99)
Potassium: 3.7 mmol/L (ref 3.5–5.1)
Sodium: 140 mmol/L (ref 135–145)
Total Bilirubin: 0.5 mg/dL (ref 0.3–1.2)
Total Protein: 8.2 g/dL — ABNORMAL HIGH (ref 6.5–8.1)

## 2020-08-23 LAB — LIPASE, BLOOD: Lipase: 91 U/L — ABNORMAL HIGH (ref 11–51)

## 2020-08-23 NOTE — Telephone Encounter (Signed)
error 

## 2020-08-23 NOTE — ED Provider Notes (Signed)
[facility] EMERGENCY DEPARTMENT Provider Note  CSN: [id number] Arrival date & time: 08/23/20 1539    History Chief Complaint  Patient presents with  . Abdominal Pain  . Dizziness  . Hypertension    HPI  SHAANA ACOCELLA is a 79 y.o. female patient with history of HTN, DM and GERD reports about 2 hours ago she began to feel 'blah' and thought that her glucose might be low. She ate something and when she checked her sugar it was 87. She felt some nausea at her epigastric area and a mild pain in her L periumbilical region that resolved after she took a Protonix. She is currently symptom free.    Past Medical History:  Diagnosis Date  . Allergic rhinitis, cause unspecified   . Anxiety state, unspecified    none recent  . Benign neoplasm of colon   . Cancer ([place])    renal mass  . Cataract   . Chronic kidney disease 01/2016   Tumor left side  . Diverticulosis of colon (without mention of hemorrhage)   . Dysphagia   . Family history of adverse reaction to anesthesia 3 yrs ago   slow to awaken   . Family history of malignant neoplasm of gastrointestinal tract   . Heart murmur   . Hypertension   . Nonspecific (abnormal) findings on radiological and other examination of other intrathoracic organs   . Nonspecific abnormal electrocardiogram (ECG) (EKG)   . Osteoporosis   . Other dysphagia   . Pure hypercholesterolemia   . Type II or unspecified type diabetes mellitus without mention of complication, not stated as uncontrolled   . Unspecified essential hypertension   . Unspecified tinnitus years ago    Past Surgical History:  Procedure Laterality Date  . ABLATION Left 01/2016   Renal mass  . CATARACT EXTRACTION    . CESAREAN SECTION     x 1  . COLONOSCOPY    . colonscopy and endoscopy  5 yrs ago  . FOOT SURGERY     bilateral for hammer toes  . IR GENERIC HISTORICAL  12/06/2015   IR RADIOLOGIST EVAL & MGMT 12/06/2015 Aletta Edouard, MD [facility] INTERV RAD  . IR  GENERIC HISTORICAL  05/03/2016   IR RADIOLOGIST EVAL & MGMT 05/03/2016 Aletta Edouard, MD [facility] INTERV RAD  . IR GENERIC HISTORICAL  02/09/2016   IR RADIOLOGIST EVAL & MGMT 02/09/2016 [facility] INTERV RAD  . IR RADIOLOGIST EVAL & MGMT  01/09/2017  . IR RADIOLOGIST EVAL & MGMT  01/14/2018  . IR RADIOLOGIST EVAL & MGMT  03/31/2019  . IR RADIOLOGIST EVAL & MGMT  03/30/2020  . UPPER GASTROINTESTINAL ENDOSCOPY      Family History  Problem Relation Age of Onset  . Colon cancer Brother 63  . Esophageal cancer Neg Hx   . Rectal cancer Neg Hx   . Stomach cancer Neg Hx     Social History   Tobacco Use  . Smoking status: Former Smoker    Packs/day: 0.50    Years: 30.00    Pack years: 15.00    Types: Cigarettes    Quit date: 10/09/1999    Years since quitting: 20.8  . Smokeless tobacco: Never Used  Vaping Use  . Vaping Use: Never used  Substance Use Topics  . Alcohol use: Yes    Alcohol/week: 1.0 standard drink    Types: 1 Standard drinks or equivalent per week    Comment: social use  . Drug use: No  Home Medications Prior to Admission medications   Medication Sig Start Date End Date Taking? Authorizing Provider  acetaminophen (TYLENOL) 325 MG tablet Take 2 tablets (650 mg total) by mouth every 6 (six) hours as needed for moderate pain. 12/24/18   Nils Flack, Mina A, PA-C  amLODipine (NORVASC) 10 MG tablet TAKE 1 TABLET DAILY 06/09/20   Hoyt Koch, MD  aspirin 81 MG tablet Take 81 mg by mouth every morning.     [provider]  atorvastatin (LIPITOR) 20 MG tablet TAKE 1 TABLET DAILY 06/14/20   Hoyt Koch, MD  blood glucose meter kit and supplies Dispense based on patient and insurance preference. Use up to four times daily as directed. (E11.9). 05/16/18   Hoyt Koch, MD  Blood Glucose Monitoring Suppl (ONE TOUCH ULTRA 2) w/Device KIT USE TO CHECK BLOOD SUGAR   TWO TIMES A DAY 02/09/20   Hoyt Koch, MD  Cholecalciferol (VITAMIN D) 2000 UNITS CAPS Take  2,000 Units by mouth daily.     [provider]  famotidine (PEPCID) 40 MG tablet Take 1 tablet (40 mg total) by mouth at bedtime. 02/02/20   Milus Banister, MD  fexofenadine (ALLEGRA) 180 MG tablet Take 1 tablet (180 mg total) by mouth daily. Take 1 tablet by mouth once daily as needed for allergies 07/14/18   Hoyt Koch, MD  fluticasone [facility]) 50 MCG/ACT nasal spray Place 2 sprays into both nostrils daily. 09/16/19   Hoyt Koch, MD  ONE TOUCH LANCETS MISC Used to check blood sugars twice daily 05/26/18   Hoyt Koch, MD  [facility] ULTRA test strip CHECK BLOOD SUGAR TWO TIMESA DAY 06/14/20   Hoyt Koch, MD  pantoprazole (PROTONIX) 40 MG tablet Take 1 tablet (40 mg total) by mouth 2 (two) times daily before a meal. 03/17/20   Hoyt Koch, MD     Allergies    Patient has no known allergies.   Review of Systems   Review of Systems A comprehensive review of systems was completed and negative except as noted in HPI.    Physical Exam BP (!) 161/80   Pulse 90   Temp 98.2 F (36.8 C) (Oral)   Resp 18   Ht 5' 3.5" (1.613 m)   Wt 68.5 kg   SpO2 100%   BMI 26.33 kg/m   Physical Exam Vitals and nursing note reviewed.  Constitutional:      Appearance: Normal appearance.  HENT:     Head: Normocephalic and atraumatic.     Nose: Nose normal.     Mouth/Throat:     Mouth: Mucous membranes are moist.  Eyes:     Extraocular Movements: Extraocular movements intact.     Conjunctiva/sclera: Conjunctivae normal.  Cardiovascular:     Rate and Rhythm: Normal rate.     Heart sounds: Murmur heard.   Pulmonary:     Effort: Pulmonary effort is normal.     Breath sounds: Normal breath sounds.  Abdominal:     General: Abdomen is flat.     Palpations: Abdomen is soft.     Tenderness: There is no abdominal tenderness. There is no guarding. Negative signs include Murphy's sign and McBurney's sign.  Musculoskeletal:        General: No  swelling. Normal range of motion.     Cervical back: Neck supple.  Skin:    General: Skin is warm and dry.  Neurological:     General: No focal deficit present.  Mental Status: She is alert.  Psychiatric:        Mood and Affect: Mood normal.      ED Results / Procedures / Treatments   Labs (all labs ordered are listed, but only abnormal results are displayed) Labs Reviewed  LIPASE, BLOOD - Abnormal; Notable for the following components:      Result Value   Lipase 91 (*)    All other components within normal limits  COMPREHENSIVE METABOLIC PANEL - Abnormal; Notable for the following components:   Glucose, Bld 104 (*)    Total Protein 8.2 (*)    GFR, Estimated 57 (*)    All other components within normal limits  URINALYSIS, ROUTINE W REFLEX MICROSCOPIC - Abnormal; Notable for the following components:   Color, Urine COLORLESS (*)    Specific Gravity, Urine 1.003 (*)    All other components within normal limits  CBC    EKG None  Radiology No results found.  Procedures Procedures  Medications Ordered in the ED Medications - No data to display   MDM Rules/Calculators/A&P MDM Patient with vague symptoms starting 2 hours ago have since resolved. She is feeling better now. Will check basic labs and reassess for dispo.  ED Course  I have reviewed the triage vital signs and the nursing notes.  Pertinent labs & imaging results that were available during my care of the patient were reviewed by me and considered in my medical decision making (see chart for details).  Clinical Course as of Aug 23 1717  Tue Aug 23, 2020  1623 UA and CBC are normal.    [CS]  1658 CMP unremarkable. Lipase mildly elevated, similar to multiple previous.    [CS]  8347 Labs unremarkable. Patient remains asymptomatic and eager to go home. Advised to continue meds, monitor glucose at home, follow up with PCP. RTED for any other concerns.    [CS]    Clinical Course User Index [CS] Truddie Hidden, MD    Final Clinical Impression(s) / ED Diagnoses Final diagnoses:  Nausea    Rx / DC Orders ED Discharge Orders    None       Truddie Hidden, MD 08/23/20 1718

## 2020-08-23 NOTE — ED Triage Notes (Signed)
Patient reports that she has had elevated BP, lightheadedness, and mid abdominal pain that started approx 1 1/2 hours ago. Patient states she has had some intermittent nausea today as well.

## 2020-08-25 ENCOUNTER — Ambulatory Visit: Payer: Medicare HMO | Admitting: Internal Medicine

## 2020-08-29 ENCOUNTER — Telehealth: Payer: Self-pay | Admitting: Gastroenterology

## 2020-08-29 NOTE — Telephone Encounter (Signed)
The pt has no trouble swallowing solids or liquids.

## 2020-08-29 NOTE — Telephone Encounter (Signed)
The pt complains of nausea and GERD.  She is taking Pepcid at bedtime and pantoprazole BID.  She is following anti reflux precautions.  She has been scheduled for January and will call to see if we have any cancellations.

## 2020-09-06 ENCOUNTER — Ambulatory Visit
Admission: RE | Admit: 2020-09-06 | Discharge: 2020-09-06 | Disposition: A | Discharge status: Home / Self Care | Payer: Medicare HMO | Source: Ambulatory Visit | Attending: Internal Medicine | Admitting: Internal Medicine

## 2020-09-06 ENCOUNTER — Other Ambulatory Visit: Payer: Self-pay

## 2020-09-06 DIAGNOSIS — R69 Illness, unspecified: Secondary | ICD-10-CM | POA: Diagnosis not present

## 2020-09-06 DIAGNOSIS — Z1231 Encounter for screening mammogram for malignant neoplasm of breast: Secondary | ICD-10-CM

## 2020-09-07 ENCOUNTER — Ambulatory Visit: Payer: Medicare HMO

## 2020-09-14 ENCOUNTER — Encounter: Payer: Self-pay | Admitting: Internal Medicine

## 2020-09-14 ENCOUNTER — Telehealth (INDEPENDENT_AMBULATORY_CARE_PROVIDER_SITE_OTHER): Payer: Medicare HMO | Admitting: Internal Medicine

## 2020-09-14 DIAGNOSIS — R110 Nausea: Secondary | ICD-10-CM | POA: Insufficient documentation

## 2020-09-14 MED ORDER — ONDANSETRON HCL 4 MG PO TABS: 4.0000 mg | ORAL_TABLET | Freq: Three times a day (TID) | ORAL | 0 refills | Status: DC | PRN

## 2020-09-14 NOTE — Progress Notes (Signed)
Virtual Visit via Video Note  I connected with Jenny Madden on 09/14/20 at  8:40 AM EST by a video enabled telemedicine application and verified that I am speaking with the correct person using two identifiers.  The patient and the provider were at separate locations throughout the entire encounter. Patient location: home, Provider location: work   I discussed the limitations of evaluation and management by telemedicine and the availability of in person appointments. The patient expressed understanding and agreed to proceed. The patient and the provider were the only parties present for the visit unless noted in HPI below.  History of Present Illness: The patient is a 79 y.o. female with visit for ER follow up (seen about 2-3 weeks ago for nausea). She is eating and drinking okay without pain. She is still having the nausea. This is usually worse when she has not eaten in awhile. She feels she is eating less lately. She did eat a lot of banana pudding which upset her stomach recently. Is constipated and about the same as normal. She does take something to help only when needed, milk of magnesia. Maybe 2-3 times per month. Denies blood in stool. She is still taking protonix BID and pepcid at night without missing. Overall it is intermittent but stable.   Observations/Objective: Voice strong, A and O times 3, no coughing or dyspnea during visit  Assessment and Plan: See problem oriented charting  Follow Up Instructions: keep visit with GI and [place] lately, add boost/ensure daily, rx zofran to use if needed  I discussed the assessment and treatment plan with the patient. The patient was provided an opportunity to ask questions and all were answered. The patient agreed with the plan and demonstrated an understanding of the instructions.   The patient was advised to call back or seek an in-person evaluation if the symptoms worsen or if the condition fails to improve as anticipated.  Hoyt Koch, MD

## 2020-09-14 NOTE — Assessment & Plan Note (Signed)
Refill zofran prn. Keep taking protonix BID and pepcid. Try milk of magnesia several days in a row to help with chronic constipation and potentially high stool burden. Keep visit with [place] next week and GI in January. Elevated lipase noted but chronic and prior CT abdomen without pancreatic changes or inflammation in setting of similar levels.

## 2020-09-19 ENCOUNTER — Other Ambulatory Visit: Payer: Self-pay

## 2020-09-20 ENCOUNTER — Ambulatory Visit (INDEPENDENT_AMBULATORY_CARE_PROVIDER_SITE_OTHER): Payer: Medicare HMO | Admitting: Internal Medicine

## 2020-09-20 ENCOUNTER — Encounter: Payer: Self-pay | Admitting: Internal Medicine

## 2020-09-20 VITALS — BP 136/70 | HR 73 | Temp 98.3°F | Ht 63.0 in | Wt 143.8 lb

## 2020-09-20 DIAGNOSIS — Z0000 Encounter for general adult medical examination without abnormal findings: Secondary | ICD-10-CM | POA: Diagnosis not present

## 2020-09-20 DIAGNOSIS — K219 Gastro-esophageal reflux disease without esophagitis: Secondary | ICD-10-CM | POA: Diagnosis not present

## 2020-09-20 DIAGNOSIS — I10 Essential (primary) hypertension: Secondary | ICD-10-CM

## 2020-09-20 DIAGNOSIS — E1169 Type 2 diabetes mellitus with other specified complication: Secondary | ICD-10-CM

## 2020-09-20 DIAGNOSIS — E559 Vitamin D deficiency, unspecified: Secondary | ICD-10-CM | POA: Diagnosis not present

## 2020-09-20 DIAGNOSIS — R110 Nausea: Secondary | ICD-10-CM

## 2020-09-20 DIAGNOSIS — E785 Hyperlipidemia, unspecified: Secondary | ICD-10-CM | POA: Diagnosis not present

## 2020-09-20 DIAGNOSIS — E118 Type 2 diabetes mellitus with unspecified complications: Secondary | ICD-10-CM | POA: Diagnosis not present

## 2020-09-20 LAB — LIPID PANEL
Cholesterol: 155 mg/dL (ref 0–200)
HDL: 69 mg/dL (ref >39.00)
LDL Cholesterol: 75 mg/dL (ref 0–99)
NonHDL: 85.92
Total CHOL/HDL Ratio: 2
Triglycerides: 54 mg/dL (ref 0.0–149.0)
VLDL: 10.8 mg/dL (ref 0.0–40.0)

## 2020-09-20 LAB — MICROALBUMIN / CREATININE URINE RATIO
Creatinine,U: 20.2 mg/dL
Microalb Creat Ratio: 3.5 mg/g (ref 0.0–30.0)
Microalb, Ur: <0.7 mg/dL (ref 0.0–1.9)

## 2020-09-20 LAB — VITAMIN B12: Vitamin B-12: 267 pg/mL (ref 211–911)

## 2020-09-20 LAB — HEMOGLOBIN A1C: Hgb A1c MFr Bld: 5.8 % (ref 4.6–6.5)

## 2020-09-20 LAB — TSH: TSH: 1.66 u[IU]/mL (ref 0.35–4.50)

## 2020-09-20 LAB — VITAMIN D 25 HYDROXY (VIT D DEFICIENCY, FRACTURES): VITD: 53.03 ng/mL (ref 30.00–100.00)

## 2020-09-20 MED ORDER — FEXOFENADINE HCL 180 MG PO TABS: 180.0000 mg | ORAL_TABLET | Freq: Every day | ORAL | 3 refills | Status: AC

## 2020-09-20 MED ORDER — ATORVASTATIN CALCIUM 20 MG PO TABS
20.0000 mg | ORAL_TABLET | Freq: Every day | ORAL | 0 refills | Status: DC
Start: 2020-09-20 — End: 2020-12-06

## 2020-09-20 MED ORDER — MIRTAZAPINE 15 MG PO TABS: 15.0000 mg | ORAL_TABLET | Freq: Every day | ORAL | 6 refills | Status: DC

## 2020-09-20 MED ORDER — POLYETHYLENE GLYCOL 3350 17 GM/SCOOP PO POWD: 17.0000 g | Freq: Two times a day (BID) | ORAL | 1 refills | Status: DC | PRN

## 2020-09-20 MED ORDER — AMLODIPINE BESYLATE 10 MG PO TABS
10.0000 mg | ORAL_TABLET | Freq: Every day | ORAL | 1 refills | Status: DC
Start: 2020-09-20 — End: 2020-11-17

## 2020-09-20 MED ORDER — PANTOPRAZOLE SODIUM 40 MG PO TBEC
40.0000 mg | DELAYED_RELEASE_TABLET | Freq: Two times a day (BID) | ORAL | 3 refills | Status: DC
Start: 2020-09-20 — End: 2020-11-28

## 2020-09-20 NOTE — Assessment & Plan Note (Signed)
Checking lipid panel and adjust lipitor 20 mg daily as needed. 

## 2020-09-20 NOTE — Assessment & Plan Note (Signed)
Taking protonix BID and refilled today.

## 2020-09-20 NOTE — Assessment & Plan Note (Signed)
Flu shot completed for season. Covid-19 up to date including booster. Pneumonia complete. Shingrix counseled. Tetanus due declines. Colonoscopy due 2023 depending on health status. Mammogram aged out, pap smear aged out and dexa due 2022. Counseled about sun safety and mole surveillance. Counseled about the dangers of distracted driving. Given 10 year screening recommendations.

## 2020-09-20 NOTE — Progress Notes (Signed)
   Subjective:   Patient ID: Jenny Madden, female    DOB: 10/23/1940, 79 y.o.   MRN: [id number]  HPI The patient is an 79 YO female coming in for physical.   PMH, [place], social history reviewed and updated  Review of Systems  Constitutional: Negative.   HENT: Negative.   Eyes: Negative.   Respiratory: Negative for cough, chest tightness and shortness of breath.   Cardiovascular: Negative for chest pain, palpitations and leg swelling.  Gastrointestinal: Positive for nausea. Negative for abdominal distention, abdominal pain, constipation, diarrhea and vomiting.  Musculoskeletal: Negative.   Skin: Negative.   Neurological: Negative.   Psychiatric/Behavioral: Negative.     Objective:  Physical Exam Constitutional:      Appearance: She is well-developed and well-nourished.  HENT:     Head: Normocephalic and atraumatic.  Eyes:     Extraocular Movements: EOM normal.  Cardiovascular:     Rate and Rhythm: Normal rate and regular rhythm.     Heart sounds: Murmur heard.      Comments: Systolic murmur Pulmonary:     Effort: Pulmonary effort is normal. No respiratory distress.     Breath sounds: Normal breath sounds. No wheezing or rales.  Abdominal:     General: Bowel sounds are normal. There is no distension.     Palpations: Abdomen is soft.     Tenderness: There is no abdominal tenderness. There is no rebound.  Musculoskeletal:        General: No edema.     Cervical back: Normal range of motion.  Skin:    General: Skin is warm and dry.     Comments: Foot exam done  Neurological:     Mental Status: She is alert and oriented to person, place, and time.     Coordination: Coordination normal.  Psychiatric:        Mood and Affect: Mood and affect normal.     Vitals:   09/20/20 0947  BP: 136/70  Pulse: 73  Temp: 98.3 F (36.8 C)  TempSrc: Oral  SpO2: 99%  Weight: 143 lb 12.8 oz (65.2 kg)  Height: 5\' 3"  (1.6 m)    This visit occurred during the SARS-CoV-2 public  health emergency.  Safety protocols were in place, including screening questions prior to the visit, additional usage of staff PPE, and extensive cleaning of exam room while observing appropriate contact time as indicated for disinfecting solutions.   Assessment & Plan:

## 2020-09-20 NOTE — Assessment & Plan Note (Signed)
Taking amlodipine and checking CMP and adjust as needed. BP at goal.

## 2020-09-20 NOTE — Assessment & Plan Note (Signed)
Checking HgA1c, microalbumin to creatinine ratio. Reminded about eye exam. Foot exam done. Diet controlled currently with intermittent morning sugar monitoring which is at goal. Adjust as needed.

## 2020-09-20 NOTE — Assessment & Plan Note (Signed)
Rx miralax for daily use to help with constipation to see if this will ease symptoms.

## 2020-09-20 NOTE — Patient Instructions (Addendum)
We have sent in remeron to take in the evening to help with sleep and depression. Give this 3-4 weeks to work and let us know how it is doing.   Health Maintenance, Female Adopting a healthy lifestyle and getting preventive care are important in promoting health and wellness. Ask your health care provider about:  The right schedule for you to have regular tests and exams.  Things you can do on your own to prevent diseases and keep yourself healthy. What should I know about diet, weight, and exercise? Eat a healthy diet   Eat a diet that includes plenty of vegetables, fruits, low-fat dairy products, and lean protein.  Do not eat a lot of foods that are high in solid fats, added sugars, or sodium. Maintain a healthy weight Body mass index (BMI) is used to identify weight problems. It estimates body fat based on height and weight. Your health care provider can help determine your BMI and help you achieve or maintain a healthy weight. Get regular exercise Get regular exercise. This is one of the most important things you can do for your health. Most adults should:  Exercise for at least 150 minutes each week. The exercise should increase your heart rate and make you sweat (moderate-intensity exercise).  Do strengthening exercises at least twice a week. This is in addition to the moderate-intensity exercise.  Spend less time sitting. Even light physical activity can be beneficial. Watch cholesterol and blood lipids Have your blood tested for lipids and cholesterol at 80 years of age, then have this test every 5 years. Have your cholesterol levels checked more often if:  Your lipid or cholesterol levels are high.  You are older than 80 years of age.  You are at high risk for heart disease. What should I know about cancer screening? Depending on your health history and family history, you may need to have cancer screening at various ages. This may include screening for:  Breast  cancer.  Cervical cancer.  Colorectal cancer.  Skin cancer.  Lung cancer. What should I know about heart disease, diabetes, and high blood pressure? Blood pressure and heart disease  High blood pressure causes heart disease and increases the risk of stroke. This is more likely to develop in people who have high blood pressure readings, are of African descent, or are overweight.  Have your blood pressure checked: ? Every 3-5 years if you are 54-67 years of age. ? Every year if you are 4 years old or older. Diabetes Have regular diabetes screenings. This checks your fasting blood sugar level. Have the screening done:  Once every three years after age 33 if you are at a normal weight and have a low risk for diabetes.  More often and at a younger age if you are overweight or have a high risk for diabetes. What should I know about preventing infection? Hepatitis B If you have a higher risk for hepatitis B, you should be screened for this virus. Talk with your health care provider to find out if you are at risk for hepatitis B infection. Hepatitis C Testing is recommended for:  Everyone born from [birth year] through 1966.  Anyone with known risk factors for hepatitis C. Sexually transmitted infections (STIs)  Get screened for STIs, including gonorrhea and chlamydia, if: ? You are sexually active and are younger than 80 years of age. ? You are older than 80 years of age and your health care provider tells you that you are at risk  for this type of infection. ? Your sexual activity has changed since you were last screened, and you are at increased risk for chlamydia or gonorrhea. Ask your health care provider if you are at risk.  Ask your health care provider about whether you are at high risk for HIV. Your health care provider may recommend a prescription medicine to help prevent HIV infection. If you choose to take medicine to prevent HIV, you should first get tested for HIV. You should  then be tested every 3 months for as long as you are taking the medicine. Pregnancy  If you are about to stop having your period (premenopausal) and you may become pregnant, seek counseling before you get pregnant.  Take 400 to 800 micrograms (mcg) of folic acid every day if you become pregnant.  Ask for birth control (contraception) if you want to prevent pregnancy. Osteoporosis and menopause Osteoporosis is a disease in which the bones lose minerals and strength with aging. This can result in bone fractures. If you are 31 years old or older, or if you are at risk for osteoporosis and fractures, ask your health care provider if you should:  Be screened for bone loss.  Take a calcium or vitamin D supplement to lower your risk of fractures.  Be given hormone replacement therapy (HRT) to treat symptoms of menopause. Follow these instructions at home: Lifestyle  Do not use any products that contain nicotine or tobacco, such as cigarettes, e-cigarettes, and chewing tobacco. If you need help quitting, ask your health care provider.  Do not use street drugs.  Do not share needles.  Ask your health care provider for help if you need support or information about quitting drugs. Alcohol use  Do not drink alcohol if: ? Your health care provider tells you not to drink. ? You are pregnant, may be pregnant, or are planning to become pregnant.  If you drink alcohol: ? Limit how much you use to 0-1 drink a day. ? Limit intake if you are breastfeeding.  Be aware of how much alcohol is in your drink. In the [place] one drink equals one 12 oz bottle of beer (355 mL), one 5 oz glass of wine (148 mL), or one 1 oz glass of hard liquor (44 mL). General instructions  Schedule regular health, dental, and eye exams.  Stay current with your vaccines.  Tell your health care provider if: ? You often feel depressed. ? You have ever been abused or do not feel safe at home. Summary  Adopting a  healthy lifestyle and getting preventive care are important in promoting health and wellness.  Follow your health care provider's instructions about healthy diet, exercising, and getting tested or screened for diseases.  Follow your health care provider's instructions on monitoring your cholesterol and blood pressure. This information is not intended to replace advice given to you by your health care provider. Make sure you discuss any questions you have with your health care provider. Document Revised: 09/17/2018 Document Reviewed: 09/17/2018 Elsevier Patient Education  2020 [employer].

## 2020-09-23 ENCOUNTER — Telehealth: Payer: Self-pay | Admitting: Internal Medicine

## 2020-09-23 NOTE — Telephone Encounter (Signed)
1000 mcg daily which is the same as 100 mg daily. Sometimes it is labeled either way.

## 2020-09-23 NOTE — Telephone Encounter (Signed)
Patient calling back to check on status, informed her of Dr. Charlynne Cousins recommendation

## 2020-09-23 NOTE — Telephone Encounter (Signed)
Patient wondering what dosage of Vitamin B12 OTC she needs to get [phone]

## 2020-09-26 ENCOUNTER — Other Ambulatory Visit: Payer: Self-pay | Admitting: Internal Medicine

## 2020-09-26 DIAGNOSIS — R69 Illness, unspecified: Secondary | ICD-10-CM | POA: Diagnosis not present

## 2020-09-26 NOTE — Telephone Encounter (Signed)
Can take B12 and protonix with or without food and can take together. Vitamin D 2000 units daily is recommended for bone strength and can continue.

## 2020-09-26 NOTE — Telephone Encounter (Signed)
Patient states she is supposed to take her Vitamin b12 with food and also her  pantoprazole (PROTONIX) 40 MG tablet with food so she is wondering if she can take those at the same time or if she needs to space them out. Patient also wondering if she still needs to be taking her vitamin D

## 2020-09-26 NOTE — Telephone Encounter (Signed)
Pt informed of below.  

## 2020-10-28 ENCOUNTER — Ambulatory Visit: Payer: Medicare HMO | Admitting: Gastroenterology

## 2020-11-04 ENCOUNTER — Telehealth: Payer: Self-pay | Admitting: Internal Medicine

## 2020-11-04 ENCOUNTER — Encounter: Payer: Self-pay | Admitting: Internal Medicine

## 2020-11-04 NOTE — Telephone Encounter (Signed)
Patient said she has been taking B12 and she said for the last few days she has been having all over body cramps. She said that she had read somewhere that B12 could cause this. She was wondering if she could start taking it differently. She can be reached at [phone].

## 2020-11-04 NOTE — Telephone Encounter (Signed)
Patient called again

## 2020-11-04 NOTE — Telephone Encounter (Signed)
See below

## 2020-11-04 NOTE — Telephone Encounter (Signed)
It should not cause cramps but okay to stop and see if they improve.

## 2020-11-07 NOTE — Telephone Encounter (Signed)
Patient has been talking sending mychart messages in regards to this matter.

## 2020-11-15 ENCOUNTER — Telehealth: Payer: Self-pay | Admitting: Internal Medicine

## 2020-11-15 NOTE — Telephone Encounter (Signed)
   Patient requesting refill for amLODipine (NORVASC) 10 MG tablet and ONETOUCH ULTRA test strip  Pharmacy  [facility] [place] [address] to [address] [place] [address] Phone:  [phone]

## 2020-11-17 MED ORDER — AMLODIPINE BESYLATE 10 MG PO TABS
10.0000 mg | ORAL_TABLET | Freq: Every day | ORAL | 1 refills | Status: DC
Start: 2020-11-17 — End: 2020-12-27

## 2020-11-17 MED ORDER — ONETOUCH ULTRA VI STRP: ORAL_STRIP | 2 refills | Status: DC

## 2020-11-17 NOTE — Telephone Encounter (Signed)
[facility]

## 2020-11-28 ENCOUNTER — Telehealth: Payer: Self-pay | Admitting: Internal Medicine

## 2020-11-28 MED ORDER — PANTOPRAZOLE SODIUM 40 MG PO TBEC
40.0000 mg | DELAYED_RELEASE_TABLET | Freq: Two times a day (BID) | ORAL | 3 refills | Status: DC
Start: 2020-11-28 — End: 2021-02-08

## 2020-11-28 NOTE — Telephone Encounter (Signed)
Please send order to [facility] for pantoprazole (PROTONIX) 40 MG tablet Pharmacy calling to request refill

## 2020-11-28 NOTE — Telephone Encounter (Signed)
Medication has been sent to the patient's pharmacy.  

## 2020-11-30 ENCOUNTER — Telehealth: Payer: Self-pay | Admitting: Internal Medicine

## 2020-11-30 NOTE — Progress Notes (Signed)
°  Chronic Care Management   Note  11/30/2020 Name: Jenny Madden MRN: [id number] DOB: 01/15/1941  Jenny Madden is a 80 y.o. year old female who is a primary care patient of Hoyt Koch, MD. I reached out to Darlin Drop by phone today in response to a referral sent by [name] PCP, Hoyt Koch, MD.   [name] was given information about Chronic Care Management services today including:  1. CCM service includes personalized support from designated clinical staff supervised by her physician, including individualized plan of care and coordination with other care providers 2. 24/7 contact phone numbers for assistance for urgent and routine care needs. 3. Service will only be billed when office clinical staff spend 20 minutes or more in a month to coordinate care. 4. Only one practitioner may furnish and bill the service in a calendar month. 5. The patient may stop CCM services at any time (effective at the end of the month) by phone call to the office staff.   Patient agreed to services and verbal consent obtained.   Follow up plan:   Carley Perdue [employer] Scheduler

## 2020-12-06 ENCOUNTER — Telehealth: Payer: Self-pay | Admitting: Internal Medicine

## 2020-12-06 MED ORDER — ATORVASTATIN CALCIUM 20 MG PO TABS
20.0000 mg | ORAL_TABLET | Freq: Every day | ORAL | 0 refills | Status: DC
Start: 2020-12-06 — End: 2021-02-21

## 2020-12-06 NOTE — Telephone Encounter (Signed)
Lipitor has been sent to the pharmacy. GI prescribed her pepcid.

## 2020-12-06 NOTE — Telephone Encounter (Signed)
Patient requesting refill for atorvastatin (LIPITOR) 20 MG tablet and famotidine (PEPCID) 40 MG tablet   Pharmacy  [facility]

## 2020-12-09 ENCOUNTER — Telehealth: Payer: Self-pay | Admitting: Cardiovascular Disease

## 2020-12-09 NOTE — Telephone Encounter (Signed)
Patient called and stated that she needs a CT scheduled. Wants Dr. Angelena Form or nurse to give him a call back

## 2020-12-12 NOTE — Telephone Encounter (Signed)
Left message for patient to call back  

## 2020-12-14 ENCOUNTER — Ambulatory Visit: Payer: Medicare HMO | Admitting: Gastroenterology

## 2020-12-15 NOTE — Telephone Encounter (Signed)
Called patient.  She said this morning the bruise is gone.  She is not sure how she got it.  Discussed taking aspirin and easier bruising due to this.  She doesn't think she really bumps into anything and bruises just show up.  She is going to discuss with her primary care doctor.

## 2020-12-15 NOTE — Telephone Encounter (Signed)
The patient brought her friend to the office last week for a CT scan to look for plaque in her arteries and she wants to know if she should get one too.   She has follow up with Gerrianne Scale, PA-C next month and will discuss with her at that time.

## 2020-12-16 ENCOUNTER — Encounter: Payer: Self-pay | Admitting: Internal Medicine

## 2020-12-19 ENCOUNTER — Other Ambulatory Visit: Payer: Self-pay

## 2020-12-20 ENCOUNTER — Ambulatory Visit (INDEPENDENT_AMBULATORY_CARE_PROVIDER_SITE_OTHER): Payer: Medicare HMO | Admitting: Internal Medicine

## 2020-12-20 ENCOUNTER — Encounter: Payer: Self-pay | Admitting: Internal Medicine

## 2020-12-20 VITALS — BP 130/74 | HR 88 | Temp 98.2°F | Resp 18 | Ht 63.0 in | Wt 147.2 lb

## 2020-12-20 DIAGNOSIS — R238 Other skin changes: Secondary | ICD-10-CM

## 2020-12-20 DIAGNOSIS — R233 Spontaneous ecchymoses: Secondary | ICD-10-CM | POA: Insufficient documentation

## 2020-12-20 LAB — CBC WITH DIFFERENTIAL/PLATELET
Basophils Absolute: 0 10*3/uL (ref 0.0–0.1)
Basophils Relative: 0.6 % (ref 0.0–3.0)
Eosinophils Absolute: 0 10*3/uL (ref 0.0–0.7)
Eosinophils Relative: 0.3 % (ref 0.0–5.0)
HCT: 37.7 % (ref 36.0–46.0)
Hemoglobin: 12.4 g/dL (ref 12.0–15.0)
Lymphocytes Relative: 28.8 % (ref 12.0–46.0)
Lymphs Abs: 1.8 10*3/uL (ref 0.7–4.0)
MCHC: 32.8 g/dL (ref 30.0–36.0)
MCV: 83 fl (ref 78.0–100.0)
Monocytes Absolute: 0.6 10*3/uL (ref 0.1–1.0)
Monocytes Relative: 9.2 % (ref 3.0–12.0)
Neutro Abs: 3.9 10*3/uL (ref 1.4–7.7)
Neutrophils Relative %: 61.1 % (ref 43.0–77.0)
Platelets: 237 10*3/uL (ref 150.0–400.0)
RBC: 4.54 Mil/uL (ref 3.87–5.11)
RDW: 13.4 % (ref 11.5–15.5)
WBC: 6.3 10*3/uL (ref 4.0–10.5)

## 2020-12-20 LAB — APTT: aPTT: 28.6 s (ref 23.4–32.7)

## 2020-12-20 LAB — PROTIME-INR
INR: 1.1 ratio — ABNORMAL HIGH (ref 0.8–1.0)
Prothrombin Time: 12.4 s (ref 9.6–13.1)

## 2020-12-20 MED ORDER — FAMOTIDINE 40 MG PO TABS
40.0000 mg | ORAL_TABLET | Freq: Every day | ORAL | 3 refills | Status: DC
Start: 2020-12-20 — End: 2021-02-08

## 2020-12-20 NOTE — Progress Notes (Signed)
   Subjective:   Patient ID: Jenny Madden, female    DOB: 10/22/1940, 80 y.o.   MRN: [id number]  HPI The patient is an 80 YO female coming in for easy bruising. Noticed in the last few months. Does take aspirin 81 mg daily but this is not new. Denies blood in stool. Some constipation which is chronic. No joint swelling or pain. No recent falls or injury. Bruises are not where there is any trauma. One on the inner thigh right which is currently gone. Some on right upper arm which has not resolved.   Review of Systems  Constitutional: Negative.   HENT: Negative.   Eyes: Negative.   Respiratory: Negative for cough, chest tightness and shortness of breath.   Cardiovascular: Negative for chest pain, palpitations and leg swelling.  Gastrointestinal: Positive for constipation. Negative for abdominal distention, abdominal pain, diarrhea, nausea and vomiting.  Musculoskeletal: Negative.   Skin: Negative.   Neurological: Negative.   Hematological: Bruises/bleeds easily.  Psychiatric/Behavioral: Negative.     Objective:  Physical Exam Constitutional:      Appearance: She is well-developed.  HENT:     Head: Normocephalic and atraumatic.  Cardiovascular:     Rate and Rhythm: Normal rate and regular rhythm.     Heart sounds: Murmur heard.    Pulmonary:     Effort: Pulmonary effort is normal. No respiratory distress.     Breath sounds: Normal breath sounds. No wheezing or rales.  Abdominal:     General: Bowel sounds are normal. There is no distension.     Palpations: Abdomen is soft.     Tenderness: There is no abdominal tenderness. There is no rebound.  Musculoskeletal:     Cervical back: Normal range of motion.  Skin:    General: Skin is warm and dry.  Neurological:     Mental Status: She is alert and oriented to person, place, and time.     Coordination: Coordination normal.     Vitals:   12/20/20 0936  BP: 130/74  Pulse: 88  Resp: 18  Temp: 98.2 F (36.8 C)  TempSrc:  Oral  SpO2: 99%  Weight: 147 lb 3.2 oz (66.8 kg)  Height: 5\' 3"  (1.6 m)    This visit occurred during the SARS-CoV-2 public health emergency.  Safety protocols were in place, including screening questions prior to the visit, additional usage of staff PPE, and extensive cleaning of exam room while observing appropriate contact time as indicated for disinfecting solutions.   Assessment & Plan:

## 2020-12-20 NOTE — Patient Instructions (Signed)
We will check the labs today. 

## 2020-12-20 NOTE — Assessment & Plan Note (Signed)
Checking CBC with diff, PT/INR, and PTT to assess. May be related to aspirin usage.

## 2020-12-21 ENCOUNTER — Encounter: Payer: Self-pay | Admitting: Internal Medicine

## 2020-12-27 ENCOUNTER — Other Ambulatory Visit: Payer: Self-pay

## 2020-12-27 ENCOUNTER — Ambulatory Visit (INDEPENDENT_AMBULATORY_CARE_PROVIDER_SITE_OTHER): Payer: Medicare HMO | Admitting: Internal Medicine

## 2020-12-27 ENCOUNTER — Encounter: Payer: Self-pay | Admitting: Internal Medicine

## 2020-12-27 DIAGNOSIS — I10 Essential (primary) hypertension: Secondary | ICD-10-CM

## 2020-12-27 MED ORDER — HYDROCHLOROTHIAZIDE 12.5 MG PO CAPS: 12.5000 mg | ORAL_CAPSULE | Freq: Every day | ORAL | 3 refills | Status: DC

## 2020-12-27 NOTE — Patient Instructions (Signed)
We will have you stop taking the amlodipine and start taking hctz 1 pill daily.

## 2020-12-27 NOTE — Progress Notes (Unsigned)
   Subjective:   Patient ID: Jenny Madden, female    DOB: [birth year], 80 y.o.   MRN: [id number]  HPI The patient is a new 80 YO female coming in for concerns about swelling in her ankles. Left more than right. No changes in diet or exercise. Still taking her amlodipine 10 mg daily. Denies missing or change in does. Denies fevers or chills. No SOB or chest pains. Has not tried elevating legs. Stable swelling throughout the day.   Review of Systems  Constitutional: Negative.   HENT: Negative.   Eyes: Negative.   Respiratory: Negative for cough, chest tightness and shortness of breath.   Cardiovascular: Positive for leg swelling. Negative for chest pain and palpitations.  Gastrointestinal: Negative for abdominal distention, abdominal pain, constipation, diarrhea, nausea and vomiting.  Musculoskeletal: Negative.   Skin: Negative.   Neurological: Negative.   Psychiatric/Behavioral: Negative.     Objective:  Physical Exam Constitutional:      Appearance: She is well-developed.  HENT:     Head: Normocephalic and atraumatic.  Cardiovascular:     Rate and Rhythm: Normal rate and regular rhythm.  Pulmonary:     Effort: Pulmonary effort is normal. No respiratory distress.     Breath sounds: Normal breath sounds. No wheezing or rales.  Abdominal:     General: Bowel sounds are normal. There is no distension.     Palpations: Abdomen is soft.     Tenderness: There is no abdominal tenderness. There is no rebound.  Musculoskeletal:     Cervical back: Normal range of motion.     Comments: Swelling in the left and right ankle, left more than right, pitting 1+  Skin:    General: Skin is warm and dry.  Neurological:     Mental Status: She is alert and oriented to person, place, and time.     Coordination: Coordination normal.     Vitals:   12/27/20 1426  BP: 130/78  Pulse: 75  Resp: 18  Temp: 97.8 F (36.6 C)  TempSrc: Oral  SpO2: 100%  Weight: 145 lb 3.2 oz (65.9 kg)  Height:  5\' 3"  (1.6 m)    This visit occurred during the SARS-CoV-2 public health emergency.  Safety protocols were in place, including screening questions prior to the visit, additional usage of staff PPE, and extensive cleaning of exam room while observing appropriate contact time as indicated for disinfecting solutions.   Assessment & Plan:

## 2020-12-29 ENCOUNTER — Encounter: Payer: Self-pay | Admitting: Internal Medicine

## 2020-12-29 NOTE — Assessment & Plan Note (Addendum)
Amlodipine could be the cause of her swelling. We talked about monitoring or change and she would like to change. Stop amlodipine 10 mg daily and start hctz 12.5 mg daily instead. Follow up in 1-2 months for BP recheck. Prior echo normal and labs recently without kidney or liver dysfunction.

## 2021-01-02 ENCOUNTER — Encounter: Payer: Self-pay | Admitting: Internal Medicine

## 2021-01-03 DIAGNOSIS — Z7982 Long term (current) use of aspirin: Secondary | ICD-10-CM | POA: Diagnosis not present

## 2021-01-03 DIAGNOSIS — I10 Essential (primary) hypertension: Secondary | ICD-10-CM | POA: Diagnosis not present

## 2021-01-03 DIAGNOSIS — E785 Hyperlipidemia, unspecified: Secondary | ICD-10-CM | POA: Diagnosis not present

## 2021-01-03 DIAGNOSIS — Z87891 Personal history of nicotine dependence: Secondary | ICD-10-CM | POA: Diagnosis not present

## 2021-01-03 DIAGNOSIS — K219 Gastro-esophageal reflux disease without esophagitis: Secondary | ICD-10-CM | POA: Diagnosis not present

## 2021-01-03 DIAGNOSIS — Z85528 Personal history of other malignant neoplasm of kidney: Secondary | ICD-10-CM | POA: Diagnosis not present

## 2021-01-10 ENCOUNTER — Encounter: Payer: Self-pay | Admitting: Internal Medicine

## 2021-01-10 MED ORDER — ONDANSETRON HCL 4 MG PO TABS: 4.0000 mg | ORAL_TABLET | Freq: Three times a day (TID) | ORAL | 0 refills | Status: DC | PRN

## 2021-01-17 NOTE — Progress Notes (Signed)
Cardiology Office Note    Date:  01/24/2021   ID:  Jenny, Madden 11/28/1940, MRN [id number]   PCP:  Hoyt Koch, MD   [place]  Cardiologist:  Lauree Chandler, MD  Advanced Practice Provider:  No care team member to display Electrophysiologist:  None   [id number]   Chief Complaint  Patient presents with  . Follow-up    History of Present Illness:  Jenny Madden is a 80 y.o. female with history of hypertension, HLD, DM, CKD, renal CA.  Patient saw Dr. Angelena Form 01/25/2020 for evaluation of murmur and palpitations.  2D echo 02/17/2020 showed normal LVEF 60 to 65% with dynamic LVOT gradient 27 mmHg at rest increased to 75 mmHg with Valsalva consistent with hypertrophic obstructive cardiomyopathy.  Dr. Angelena Form did not recommend any further work-up.  Patient comes in for yearly f/u. PCP stopped her amlodipine and put her on HCTZ for leg swelling. BP up today but not usually high at home-120-130/69. Walks 30 min daily. Denies chest pain, dyspnea, palpitations. Occasionally has shooting pain up her right arm with twinge in her right chest. Goes away in seconds and doesn't happen often. Right hand is stiff.   Past Medical History:  Diagnosis Date  . Allergic rhinitis, cause unspecified   . Anxiety state, unspecified    none recent  . Benign neoplasm of colon   . Cancer ([place])    renal mass  . Cataract   . Chronic kidney disease 01/2016   Tumor left side  . Diverticulosis of colon (without mention of hemorrhage)   . Dysphagia   . Family history of adverse reaction to anesthesia 3 yrs ago   slow to awaken   . Family history of malignant neoplasm of gastrointestinal tract   . Heart murmur   . Hypertension   . Nonspecific (abnormal) findings on radiological and other examination of other intrathoracic organs   . Nonspecific abnormal electrocardiogram (ECG) (EKG)   . Osteoporosis   . Other dysphagia   . Pure  hypercholesterolemia   . Type II or unspecified type diabetes mellitus without mention of complication, not stated as uncontrolled   . Unspecified essential hypertension   . Unspecified tinnitus years ago    Past Surgical History:  Procedure Laterality Date  . ABLATION Left 01/2016   Renal mass  . CATARACT EXTRACTION    . CESAREAN SECTION     x 1  . COLONOSCOPY    . colonscopy and endoscopy  5 yrs ago  . FOOT SURGERY     bilateral for hammer toes  . IR GENERIC HISTORICAL  12/06/2015   IR RADIOLOGIST EVAL & MGMT 12/06/2015 Aletta Edouard, MD [facility] INTERV RAD  . IR GENERIC HISTORICAL  05/03/2016   IR RADIOLOGIST EVAL & MGMT 05/03/2016 Aletta Edouard, MD [facility] INTERV RAD  . IR GENERIC HISTORICAL  02/09/2016   IR RADIOLOGIST EVAL & MGMT 02/09/2016 [facility] INTERV RAD  . IR RADIOLOGIST EVAL & MGMT  01/09/2017  . IR RADIOLOGIST EVAL & MGMT  01/14/2018  . IR RADIOLOGIST EVAL & MGMT  03/31/2019  . IR RADIOLOGIST EVAL & MGMT  03/30/2020  . UPPER GASTROINTESTINAL ENDOSCOPY      Current Medications: Current Meds  Medication Sig  . acetaminophen (TYLENOL) 325 MG tablet Take 2 tablets (650 mg total) by mouth every 6 (six) hours as needed for moderate pain.  Marland Kitchen aspirin 81 MG tablet Take 81 mg by mouth every morning.  Marland Kitchen atorvastatin (LIPITOR) 20  MG tablet Take 1 tablet (20 mg total) by mouth daily.  . blood glucose meter kit and supplies Dispense based on patient and insurance preference. Use up to four times daily as directed. (E11.9).  Marland Kitchen Blood Glucose Monitoring Suppl (ONE TOUCH ULTRA 2) w/Device KIT USE TO CHECK BLOOD SUGAR   TWO TIMES A DAY  . Cholecalciferol (VITAMIN D) 2000 UNITS CAPS Take 2,000 Units by mouth daily.  . famotidine (PEPCID) 40 MG tablet Take 1 tablet (40 mg total) by mouth at bedtime.  . fexofenadine (ALLEGRA) 180 MG tablet Take 1 tablet (180 mg total) by mouth daily. Take 1 tablet by mouth once daily as needed for allergies  . fluticasone (FLONASE) 50 MCG/ACT nasal spray Place 2  sprays into both nostrils daily.  Marland Kitchen glucose blood (ONETOUCH ULTRA) test strip CHECK BLOOD SUGAR TWO TIMESA DAY  . hydrochlorothiazide (MICROZIDE) 12.5 MG capsule Take 1 capsule (12.5 mg total) by mouth daily.  . Lancets (ONETOUCH DELICA PLUS [id number]) MISC CHECK BLOOD SUGAR TWO TIMESA DAY.  . pantoprazole (PROTONIX) 40 MG tablet Take 1 tablet (40 mg total) by mouth 2 (two) times daily before a meal.  . polyethylene glycol powder (GLYCOLAX/MIRALAX) 17 GM/SCOOP powder Take 17 g by mouth 2 (two) times daily as needed.     Allergies:   Patient has no known allergies.   Social History   Socioeconomic History  . Marital status: Widowed    Spouse name: Jonathon Bellows x 46 yrs  . Number of children: 1  . Years of education: 46  . Highest education level: High school graduate  Occupational History  . Occupation: Retired  Tobacco Use  . Smoking status: Former Smoker    Packs/day: 0.50    Years: 30.00    Pack years: 15.00    Types: Cigarettes    Quit date: 10/09/1999    Years since quitting: 21.3  . Smokeless tobacco: Never Used  Vaping Use  . Vaping Use: Never used  Substance and Sexual Activity  . Alcohol use: Yes    Alcohol/week: 1.0 standard drink    Types: 1 Standard drinks or equivalent per week    Comment: social use  . Drug use: No  . Sexual activity: Not Currently    Comment: 1st intercourse- 21, partners- 3, widow   Other Topics Concern  . Not on file  Social History Narrative   Widowed, Jonathon Bellows x 79 yrs   1 child, daughter, 2 grandddaughter   High school education   Live alone; 1 story home   Social Determinants of Health   Financial Resource Strain: Not on file  Food Insecurity: Not on file  Transportation Needs: Not on file  Physical Activity: Not on file  Stress: Not on file  Social Connections: Not on file     Family History:  The patient's family history includes Colon cancer (age of onset: 31) in her brother.   ROS:   Please see the history of present illness.     ROS All other systems reviewed and are negative.   PHYSICAL EXAM:   VS:  BP (!) 150/82   Pulse 80   Ht '5\' 3"'  (1.6 m)   Wt 146 lb 12.8 oz (66.6 kg)   SpO2 99%   BMI 26.00 kg/m   Physical Exam  GEN: Well nourished, well developed, in no acute distress  Neck: no JVD, carotid bruits, or masses Cardiac:RRR; no murmurs, rubs, or gallops  Respiratory:  clear to auscultation bilaterally, normal work of breathing GI:  soft, nontender, nondistended, + BS Ext: without cyanosis, clubbing, or edema, Good distal pulses bilaterally Neuro:  Alert and Oriented x 3 Psych: euthymic mood, full affect  Wt Readings from Last 3 Encounters:  01/24/21 146 lb 12.8 oz (66.6 kg)  12/27/20 145 lb 3.2 oz (65.9 kg)  12/20/20 147 lb 3.2 oz (66.8 kg)      Studies/Labs Reviewed:   EKG:  EKG is  ordered today.  The ekg ordered today demonstrates NSR, poor R wave progression, no changes.  Recent Labs: 08/23/2020: ALT 15; BUN 14; Creatinine, Ser 1.00; Potassium 3.7; Sodium 140 09/20/2020: TSH 1.66 12/20/2020: Hemoglobin 12.4; Platelets 237.0   Lipid Panel    Component Value Date/Time   CHOL 155 09/20/2020 1018   TRIG 54.0 09/20/2020 1018   HDL 69.00 09/20/2020 1018   CHOLHDL 2 09/20/2020 1018   VLDL 10.8 09/20/2020 1018   LDLCALC 75 09/20/2020 1018    Additional studies/ records that were reviewed today include:  2D echo 02/17/2020 IMPRESSIONS     1. Dynamic LVOT gradient - 27 mmHg rest, increased to 75 mmHg with  valsalva. Left ventricular ejection fraction, by estimation, is 60 to 65%.  The left ventricle has normal function. The left ventricle has no regional  wall motion abnormalities. There is  severe left ventricular hypertrophy of the basal-septal segment. Left  ventricular diastolic parameters are consistent with Grade I diastolic  dysfunction (impaired relaxation). Elevated left ventricular end-diastolic  pressure.   2. Right ventricular systolic function is normal. The right  ventricular  size is normal. There is normal pulmonary artery systolic pressure.   3. Left atrial size was severely dilated.   4. Systolic anterior motion of the anterior leaflet (SAM) noted. The  mitral valve is abnormal. Trivial mitral valve regurgitation.   5. The aortic valve is tricuspid. Aortic valve regurgitation is not  visualized. Mild aortic valve sclerosis is present, with no evidence of  aortic valve stenosis.   6. The inferior vena cava is normal in size with greater than 50%  respiratory variability, suggesting right atrial pressure of 3 mmHg.   Conclusion(s)/Recommendation(s): Findings consistent with hypertrophic  obstructive cardiomyopathy.     Risk Assessment/Calculations:         ASSESSMENT:    1. HOCM (hypertrophic obstructive cardiomyopathy) ([place])   2. Essential hypertension   3. Mixed hyperlipidemia      PLAN:  In order of problems listed above:  HOCM on echo 02/17/2020-no symptoms.  Hypertension BP running high today. Will restart amlodipine at lower dose-5 mg once daily. 2 gm sodium diet. She'll keep track of BP and let us know how it's doing.  Hyperlipidemia  LDL 75 09/2020 continue lipitor  Shared Decision Making/Informed Consent        Medication Adjustments/Labs and Tests Ordered: Current medicines are reviewed at length with the patient today.  Concerns regarding medicines are outlined above.  Medication changes, Labs and Tests ordered today are listed in the Patient Instructions below. There are no Patient Instructions on file for this visit.   Sumner Boast, PA-C  01/24/2021 11:13 AM    [facility] [address], [place], [place]  [address] Phone: [phone]; Fax: [phone]

## 2021-01-24 ENCOUNTER — Ambulatory Visit: Payer: Medicare HMO | Admitting: Physician Assistant

## 2021-01-24 ENCOUNTER — Other Ambulatory Visit: Payer: Self-pay

## 2021-01-24 ENCOUNTER — Telehealth: Payer: Self-pay

## 2021-01-24 ENCOUNTER — Encounter: Payer: Self-pay | Admitting: Physician Assistant

## 2021-01-24 VITALS — BP 150/82 | HR 80 | Ht 63.0 in | Wt 146.8 lb

## 2021-01-24 DIAGNOSIS — E782 Mixed hyperlipidemia: Secondary | ICD-10-CM | POA: Diagnosis not present

## 2021-01-24 DIAGNOSIS — I421 Obstructive hypertrophic cardiomyopathy: Secondary | ICD-10-CM

## 2021-01-24 DIAGNOSIS — I10 Essential (primary) hypertension: Secondary | ICD-10-CM

## 2021-01-24 MED ORDER — AMLODIPINE BESYLATE 5 MG PO TABS: 5.0000 mg | ORAL_TABLET | Freq: Every day | ORAL | 3 refills | Status: DC

## 2021-01-24 NOTE — Progress Notes (Signed)
Chronic Care Management Pharmacy Assistant   Name: Jenny Madden  MRN: [id number] DOB: Feb 13, 1941  Reason for Encounter: Initial Questions Appointments: Telephone 01/25/21 @ 11 am (Patient rescheduled)   Recent office visits:  09/14/20 Lambert Keto (PCP) - start Ondansetron HCl 4 mg daily.Keep visit with GI, add boost/ensure daily, rx zofran to use if needed  09/20/20 Theo Dills (PCP) - Start Mirtazapine 15 mg bedtime, polyethylene 3350 2x daily prn.  12/20/20 Theo Dills (PCP)   12/27/20 Theo Dills (PCP) - stop Amlodipine besylate 10 mg start HCTZ 12.5 mg daily.  Recent consult visits:  01/24/21 [facility] (Cardiology) - restart Amlodipine 5 mg daily   Hospital visits:  Medication Reconciliation was completed by comparing discharge summary, patient's EMR and Pharmacy list, and upon discussion with patient.  Admitted to the hospital on 08/23/20 due Nausea. Discharge date was 08/23/20. Discharged from [facility].    Medications that remain the same after Hospital Discharge:??  -All other medications will remain the same.    Medications: Outpatient Encounter Medications as of 01/24/2021  Medication Sig  . acetaminophen (TYLENOL) 325 MG tablet Take 2 tablets (650 mg total) by mouth every 6 (six) hours as needed for moderate pain.  Marland Kitchen amLODipine (NORVASC) 5 MG tablet Take 1 tablet (5 mg total) by mouth daily.  Marland Kitchen aspirin 81 MG tablet Take 81 mg by mouth every morning.  Marland Kitchen atorvastatin (LIPITOR) 20 MG tablet Take 1 tablet (20 mg total) by mouth daily.  . blood glucose meter kit and supplies Dispense based on patient and insurance preference. Use up to four times daily as directed. (E11.9).  Marland Kitchen Blood Glucose Monitoring Suppl (ONE TOUCH ULTRA 2) w/Device KIT USE TO CHECK BLOOD SUGAR   TWO TIMES A DAY  . Cholecalciferol (VITAMIN D) 2000 UNITS CAPS Take 2,000 Units by mouth daily.  . famotidine (PEPCID) 40 MG tablet Take 1 tablet (40 mg total) by mouth at bedtime.  .  fexofenadine (ALLEGRA) 180 MG tablet Take 1 tablet (180 mg total) by mouth daily. Take 1 tablet by mouth once daily as needed for allergies  . fluticasone (FLONASE) 50 MCG/ACT nasal spray Place 2 sprays into both nostrils daily.  Marland Kitchen glucose blood (ONETOUCH ULTRA) test strip CHECK BLOOD SUGAR TWO TIMESA DAY  . hydrochlorothiazide (MICROZIDE) 12.5 MG capsule Take 1 capsule (12.5 mg total) by mouth daily.  . Lancets (ONETOUCH DELICA PLUS [id number]) MISC CHECK BLOOD SUGAR TWO TIMESA DAY.  . pantoprazole (PROTONIX) 40 MG tablet Take 1 tablet (40 mg total) by mouth 2 (two) times daily before a meal.  . polyethylene glycol powder (GLYCOLAX/MIRALAX) 17 GM/SCOOP powder Take 17 g by mouth 2 (two) times daily as needed.   No facility-administered encounter medications on file as of 01/24/2021.    Have you seen any other providers since your last visit?  Patient states that she seen her cardiologists today.  Any changes in your medications or health?  Yes. Patient seen cardiologists today and her BP was elevated. Her provider restarted her Amlodipine at a lower dose of 5 mg daily.  Any side effects from any medications?  Patient states she experienced feet swelling and was taking off of Amlodipine 10 mg.  Do you have an symptoms or problems not managed by your medications? Yes. Patient states she sometimes has tingling in your right arm.  Any concerns about your health right now? Patient states has leg pain sometimes at night and not sure if its arthritis.   Has  your provider asked that you check blood pressure, blood sugar, or follow special diet at home?  Patient states she does check her BP daily. Today is was 130/69 at home and 150/82 at the doctor.  Do you get any type of exercise on a regular basis?Patient states she walks her neighborhood when weather permits and sometimes goes to the [facility].  Can you think of a goal you would like to reach for your health?  Patient states she just want to  feel good all the time.  Do you have any problems getting your medications?  Patient states not at this time.  Is there anything that you would like to discuss during the appointment?  Patient states not at this time.  Please bring medications and supplements to appointment   Star Rating Drugs: Atorvastatin - 12/06/20 [place], RMA Clinical Pharmacists Assistant [phone]  Patient rescheduled this appointment. Time Spent: 60

## 2021-01-24 NOTE — Patient Instructions (Signed)
Medication Instructions:  Your physician has recommended you make the following change in your medication:   START: Amlodipine 5mg  daily  *If you need a refill on your cardiac medications before your next appointment, please call your pharmacy*   Lab Work: None If you have labs (blood work) drawn today and your tests are completely normal, you will receive your results only by: Marland Kitchen MyChart Message (if you have MyChart) OR . A paper copy in the mail If you have any lab test that is abnormal or we need to change your treatment, we will call you to review the results.  Follow-Up: At [facility], you and your health needs are our priority.  As part of our continuing mission to provide you with exceptional heart care, we have created designated Provider Care Teams.  These Care Teams include your primary Cardiologist (physician) and Advanced Practice Providers (APPs -  Physician Assistants and Nurse Practitioners) who all work together to provide you with the care you need, when you need it.  Your next appointment:   1 year(s)  The format for your next appointment:   In Person  Provider:   You may see Lauree Chandler, MD or one of the following Advanced Practice Providers on your designated Care Team:    Melina Copa, PA-C  Ermalinda Barrios, PA-C    Other Instructions Two Gram Sodium Diet 2000 mg  What is Sodium? Sodium is a mineral found naturally in many foods. The most significant source of sodium in the diet is table salt, which is about 40% sodium.  Processed, convenience, and preserved foods also contain a large amount of sodium.  The body needs only 500 mg of sodium daily to function,  A normal diet provides more than enough sodium even if you do not use salt.  Why Limit Sodium? A build up of sodium in the body can cause thirst, increased blood pressure, shortness of breath, and water retention.  Decreasing sodium in the diet can reduce edema and risk of heart attack or stroke  associated with high blood pressure.  Keep in mind that there are many other factors involved in these health problems.  Heredity, obesity, lack of exercise, cigarette smoking, stress and what you eat all play a role.  General Guidelines:  Do not add salt at the table or in cooking.  One teaspoon of salt contains over 2 grams of sodium.  Read food labels  Avoid processed and convenience foods  Ask your dietitian before eating any foods not dicussed in the menu planning guidelines  Consult your physician if you wish to use a salt substitute or a sodium containing medication such as antacids.  Limit milk and milk products to 16 oz (2 cups) per day.  Shopping Hints:  READ LABELS!! "Dietetic" does not necessarily mean low sodium.  Salt and other sodium ingredients are often added to foods during processing.   Menu Planning Guidelines Food Group Choose More Often Avoid  Beverages (see also the milk group All fruit juices, low-sodium, salt-free vegetables juices, low-sodium carbonated beverages Regular vegetable or tomato juices, commercially softened water used for drinking or cooking  Breads and Cereals Enriched white, wheat, rye and pumpernickel bread, hard rolls and dinner rolls; muffins, cornbread and waffles; most dry cereals, cooked cereal without added salt; unsalted crackers and breadsticks; low sodium or homemade bread crumbs Bread, rolls and crackers with salted tops; quick breads; instant hot cereals; pancakes; commercial bread stuffing; self-rising flower and biscuit mixes; regular bread crumbs or  cracker crumbs  Desserts and Sweets Desserts and sweets mad with mild should be within allowance Instant pudding mixes and cake mixes  Fats Butter or margarine; vegetable oils; unsalted salad dressings, regular salad dressings limited to 1 Tbs; light, sour and heavy cream Regular salad dressings containing bacon fat, bacon bits, and salt pork; snack dips made with instant soup mixes or  processed cheese; salted nuts  Fruits Most fresh, frozen and canned fruits Fruits processed with salt or sodium-containing ingredient (some dried fruits are processed with sodium sulfites        Vegetables Fresh, frozen vegetables and low- sodium canned vegetables Regular canned vegetables, sauerkraut, pickled vegetables, and others prepared in brine; frozen vegetables in sauces; vegetables seasoned with ham, bacon or salt pork  Condiments, Sauces, Miscellaneous  Salt substitute with physician's approval; pepper, herbs, spices; vinegar, lemon or lime juice; hot pepper sauce; garlic powder, onion powder, low sodium soy sauce (1 Tbs.); low sodium condiments (ketchup, chili sauce, mustard) in limited amounts (1 tsp.) fresh ground horseradish; unsalted tortilla chips, pretzels, potato chips, popcorn, salsa (1/4 cup) Any seasoning made with salt including garlic salt, celery salt, onion salt, and seasoned salt; sea salt, rock salt, kosher salt; meat tenderizers; monosodium glutamate; mustard, regular soy sauce, barbecue, sauce, chili sauce, teriyaki sauce, steak sauce, Worcestershire sauce, and most flavored vinegars; canned gravy and mixes; regular condiments; salted snack foods, olives, picles, relish, horseradish sauce, catsup   Food preparation: Try these seasonings Meats:    Pork Sage, onion Serve with applesauce  Chicken Poultry seasoning, thyme, parsley Serve with cranberry sauce  Lamb Curry powder, rosemary, garlic, thyme Serve with mint sauce or jelly  Veal Marjoram, basil Serve with current jelly, cranberry sauce  Beef Pepper, bay leaf Serve with dry mustard, unsalted chive butter  Fish [place] leaf, dill Serve with unsalted lemon butter, unsalted parsley butter  Vegetables:    Asparagus Lemon juice   Broccoli Lemon juice   Carrots Mustard dressing parsley, mint, nutmeg, glazed with unsalted butter and sugar   Green beans Marjoram, lemon juice, nutmeg,dill seed   Tomatoes Basil, marjoram,  onion   Spice /blend for [employer]" 4 tsp ground thyme 1 tsp ground sage 3 tsp ground rosemary 4 tsp ground marjoram   Test your knowledge 1. A product that says "Salt Free" may still contain sodium. True or False 2. Garlic Powder and Hot Pepper Sauce an be used as alternative seasonings.True or False 3. Processed foods have more sodium than fresh foods.  True or False 4. Canned Vegetables have less sodium than froze True or False  WAYS TO DECREASE YOUR SODIUM INTAKE 1. Avoid the use of added salt in cooking and at the table.  Table salt (and other prepared seasonings which contain salt) is probably one of the greatest sources of sodium in the diet.  Unsalted foods can gain flavor from the sweet, sour, and butter taste sensations of herbs and spices.  Instead of using salt for seasoning, try the following seasonings with the foods listed.  Remember: how you use them to enhance natural food flavors is limited only by your creativity... Allspice-Meat, fish, eggs, fruit, peas, red and yellow vegetables Almond Extract-Fruit baked goods Anise Seed-Sweet breads, fruit, carrots, beets, cottage cheese, cookies (tastes like licorice) Basil-Meat, fish, eggs, vegetables, rice, vegetables salads, soups, sauces Bay Leaf-Meat, fish, stews, poultry Burnet-Salad, vegetables (cucumber-like flavor) Caraway Seed-Bread, cookies, cottage cheese, meat, vegetables, cheese, rice Cardamon-Baked goods, fruit, soups Celery Powder or seed-Salads, salad dressings, sauces, meatloaf, soup,  bread.Do not use  celery salt Chervil-Meats, salads, fish, eggs, vegetables, cottage cheese (parsley-like flavor) Chili Power-Meatloaf, chicken cheese, corn, eggplant, egg dishes Chives-Salads cottage cheese, egg dishes, soups, vegetables, sauces Cilantro-Salsa, casseroles Cinnamon-Baked goods, fruit, pork, lamb, chicken, carrots Cloves-Fruit, baked goods, fish, pot roast, green beans, beets, carrots Coriander-Pastry, cookies,  meat, salads, cheese (lemon-orange flavor) Cumin-Meatloaf, fish,cheese, eggs, cabbage,fruit pie (caraway flavor) [employer], fruit, eggs, fish, poultry, cottage cheese, vegetables Dill Seed-Meat, cottage cheese, poultry, vegetables, fish, salads, bread Fennel Seed-Bread, cookies, apples, pork, eggs, fish, beets, cabbage, cheese, Licorice-like flavor Garlic-(buds or powder) Salads, meat, poultry, fish, bread, butter, vegetables, potatoes.Do not  use garlic salt Ginger-Fruit, vegetables, baked goods, meat, fish, poultry Horseradish Root-Meet, vegetables, butter Lemon Juice or Extract-Vegetables, fruit, tea, baked goods, fish salads Mace-Baked goods fruit, vegetables, fish, poultry (taste like nutmeg) Maple Extract-Syrups Marjoram-Meat, chicken, fish, vegetables, breads, green salads (taste like Sage) Mint-Tea, lamb, sherbet, vegetables, desserts, carrots, cabbage Mustard, Dry or Seed-Cheese, eggs, meats, vegetables, poultry Nutmeg-Baked goods, fruit, chicken, eggs, vegetables, desserts Onion Powder-Meat, fish, poultry, vegetables, cheese, eggs, bread, rice salads (Do not use   Onion salt) Orange Extract-Desserts, baked goods Oregano-Pasta, eggs, cheese, onions, pork, lamb, fish, chicken, vegetables, green salads Paprika-Meat, fish, poultry, eggs, cheese, vegetables Parsley Flakes-Butter, vegetables, meat fish, poultry, eggs, bread, salads (certain forms may   Contain sodium Pepper-Meat fish, poultry, vegetables, eggs Peppermint Extract-Desserts, baked goods Poppy Seed-Eggs, bread, cheese, fruit dressings, baked goods, noodles, vegetables, cottage  [employer], poultry, meat, fish, cauliflower, turnips,eggs bread Saffron-Rice, bread, veal, chicken, fish, eggs Sage-Meat, fish, poultry, onions, eggplant, tomateos, pork, stews Savory-Eggs, salads, poultry, meat, rice, vegetables, soups, pork Tarragon-Meat, poultry, fish, eggs, butter, vegetables  (licorice-like flavor)  Thyme-Meat, poultry, fish, eggs, vegetables, (clover-like flavor), sauces, soups Tumeric-Salads, butter, eggs, fish, rice, vegetables (saffron-like flavor) Vanilla Extract-Baked goods, candy Vinegar-Salads, vegetables, meat marinades Walnut Extract-baked goods, candy  2. Choose your Foods Wisely   The following is a list of foods to avoid which are high in sodium:  Meats-Avoid all smoked, canned, salt cured, dried and kosher meat and fish as well as Anchovies   [employer] meats:Bologna, Liverwurst, Pastrami Canned meat or fish  Marinated herring Caviar    Pepperoni Corned Beef   Pizza Dried chipped beef  Salami Frozen breaded fish or meat Salt pork Frankfurters or hot dogs  Sardines Gefilte fish   Sausage Ham (boiled ham, Proscuitto Smoked butt    spiced ham)   Spam      TV Dinners Vegetables Canned vegetables (Regular) Relish Canned mushrooms  Sauerkraut Olives    Tomato juice Pickles  Bakery and Dessert Products Canned puddings  Cream pies Cheesecake   Decorated cakes Cookies  Beverages/Juices Tomato juice, regular  Gatorade   V-8 vegetable juice, regular  Breads and Cereals Biscuit mixes   Salted potato chips, corn chips, pretzels Bread stuffing mixes  Salted crackers and rolls Pancake and waffle mixes Self-rising flour  Seasonings Accent    Meat sauces Barbecue sauce  Meat tenderizer Catsup    Monosodium glutamate (MSG) Celery salt   Onion salt Chili sauce   Prepared mustard Garlic salt   Salt, seasoned salt, sea salt Gravy mixes   Soy sauce Horseradish   Steak sauce Ketchup   Tartar sauce Lite salt    Teriyaki sauce Marinade mixes   Worcestershire sauce  Others Baking powder   Cocoa and cocoa mixes Baking soda   Commercial casserole mixes Candy-caramels, chocolate  Dehydrated soups  Bars, fudge,nougats  Instant rice and pasta mixes Canned broth or soup  Maraschino cherries Cheese, aged and processed cheese and  cheese spreads  Learning Assessment Quiz  Indicated T (for True) or F (for False) for each of the following statements:  1. _____ Fresh fruits and vegetables and unprocessed grains are generally low in sodium 2. _____ Water may contain a considerable amount of sodium, depending on the source 3. _____ You can always tell if a food is high in sodium by tasting it 4. _____ Certain laxatives my be high in sodium and should be avoided unless prescribed   by a physician or pharmacist 5. _____ Salt substitutes may be used freely by anyone on a sodium restricted diet 6. _____ Sodium is present in table salt, food additives and as a natural component of   most foods 7. _____ Table salt is approximately 90% sodium 8. _____ Limiting sodium intake may help prevent excess fluid accumulation in the body 9. _____ On a sodium-restricted diet, seasonings such as bouillon soy sauce, and    cooking wine should be used in place of table salt 10. _____ On an ingredient list, a product which lists monosodium glutamate as the first   ingredient is an appropriate food to include on a low sodium diet  Circle the best answer(s) to the following statements (Hint: there may be more than one correct answer)  11. On a low-sodium diet, some acceptable snack items are:    A. Olives  F. Bean dip   K. Grapefruit juice    B. Salted Pretzels G. Commercial Popcorn   L. Canned peaches    C. Carrot Sticks  H. Bouillon   M. Unsalted nuts   D. [place] fries  I. Peanut butter crackers N. Salami   E. Sweet pickles J. Tomato Juice   O. Pizza  12.  Seasonings that may be used freely on a reduced - sodium diet include   A. Lemon wedges F.Monosodium glutamate K. Celery seed    B.Soysauce   G. Pepper   L. Mustard powder   C. Sea salt  H. Cooking wine  M. Onion flakes   D. Vinegar  E. Prepared horseradish N. Salsa   E. Sage   J. Worcestershire sauce  O. Chutney

## 2021-01-25 ENCOUNTER — Telehealth: Payer: Medicare HMO

## 2021-01-26 ENCOUNTER — Telehealth: Payer: Self-pay | Admitting: Internal Medicine

## 2021-01-26 DIAGNOSIS — I4891 Unspecified atrial fibrillation: Secondary | ICD-10-CM | POA: Diagnosis not present

## 2021-01-26 DIAGNOSIS — R0602 Shortness of breath: Secondary | ICD-10-CM | POA: Diagnosis not present

## 2021-01-26 DIAGNOSIS — I959 Hypotension, unspecified: Secondary | ICD-10-CM | POA: Diagnosis not present

## 2021-01-26 DIAGNOSIS — R42 Dizziness and giddiness: Secondary | ICD-10-CM | POA: Diagnosis not present

## 2021-01-26 NOTE — Telephone Encounter (Signed)
---  Caller states she called the paramedics for symptoms of a low BP. She was dizzy & her BP was 84/64. She thinks it was because she stood up too fast. They did an EKG. It was okay. Her BP, HR & BS was good. She took MOM last night & they thought maybe her electrolytes were off. She did not want to go to the ER. Her BP came up to 134/70. She does have HTN & takes medication. Her BP normally runs 114-120/68-70.  Advised to see PCP within 3 days

## 2021-01-26 NOTE — Telephone Encounter (Signed)
[facility]   Caller states that her blood pressure is 84/64 and would like to speak the nurse and should like to know if she should go to ED. Caller states that she does not feel well  Advised to call EMS 911 now.  CALL EMS 911 NOW: * Immediate medical attention is needed. You need to hang up and call 911 (or an ambulance). * Triager Discretion: I'll call you back in a few minutes to be sure you were able to reach them. FIRST AID - LIE DOWN FOR SHOCK: * Lie down with feet elevated. * Reason: Treatment for shock. CARE ADVICE given per Low Blood Pressure (Adult) guideline  Patient understood but was undecided. Attempted to contact the patient's daughter but unable to reach her.

## 2021-01-30 ENCOUNTER — Encounter: Payer: Self-pay | Admitting: Internal Medicine

## 2021-01-30 ENCOUNTER — Other Ambulatory Visit: Payer: Self-pay

## 2021-01-30 ENCOUNTER — Ambulatory Visit (INDEPENDENT_AMBULATORY_CARE_PROVIDER_SITE_OTHER): Payer: Medicare HMO | Admitting: Internal Medicine

## 2021-01-30 DIAGNOSIS — I10 Essential (primary) hypertension: Secondary | ICD-10-CM | POA: Diagnosis not present

## 2021-01-30 NOTE — Progress Notes (Signed)
   Subjective:   Patient ID: Jenny Madden, female    DOB: [birth year], 80 y.o.   MRN: [id number]  HPI The patient is an 80 YO female coming in for episode of dizziness and low BP over the weekend. She recently saw her cardiologist and they added amlodipine 5 mg daily but she had not started taking that yet. She is concerned that the hctz has been affecting her with some dizziness. She denies headaches or chest pains. She is feeling well today. She has taken the amlodipine the last 2 days with hctz and no dizziness. Denies change in diet or exercise before the low BP episode.   Review of Systems  Constitutional: Negative.   HENT: Negative.   Eyes: Negative.   Respiratory: Negative for cough, chest tightness and shortness of breath.   Cardiovascular: Negative for chest pain, palpitations and leg swelling.  Gastrointestinal: Negative for abdominal distention, abdominal pain, constipation, diarrhea, nausea and vomiting.  Musculoskeletal: Negative.   Skin: Negative.   Neurological: Positive for dizziness and light-headedness.  Psychiatric/Behavioral: Negative.     Objective:  Physical Exam Constitutional:      Appearance: She is well-developed.  HENT:     Head: Normocephalic and atraumatic.  Cardiovascular:     Rate and Rhythm: Normal rate and regular rhythm.  Pulmonary:     Effort: Pulmonary effort is normal. No respiratory distress.     Breath sounds: Normal breath sounds. No wheezing or rales.  Abdominal:     General: Bowel sounds are normal. There is no distension.     Palpations: Abdomen is soft.     Tenderness: There is no abdominal tenderness. There is no rebound.  Musculoskeletal:     Cervical back: Normal range of motion.  Skin:    General: Skin is warm and dry.  Neurological:     Mental Status: She is alert and oriented to person, place, and time.     Coordination: Coordination normal.     Vitals:   01/30/21 1034  BP: 134/74  Pulse: 72  Resp: 18  Temp: 98.4  F (36.9 C)  TempSrc: Oral  SpO2: 100%  Weight: 143 lb 3.2 oz (65 kg)  Height: 5\' 3"  (1.6 m)    This visit occurred during the SARS-CoV-2 public health emergency.  Safety protocols were in place, including screening questions prior to the visit, additional usage of staff PPE, and extensive cleaning of exam room while observing appropriate contact time as indicated for disinfecting solutions.   Assessment & Plan:

## 2021-01-30 NOTE — Patient Instructions (Signed)
We will have you stop taking the diuretic hctz (hydrochlorothiazide) and go back to amlodipine 10 mg daily (2 of the 5 mg pills).

## 2021-01-31 ENCOUNTER — Encounter: Payer: Self-pay | Admitting: Internal Medicine

## 2021-02-01 ENCOUNTER — Encounter: Payer: Self-pay | Admitting: Internal Medicine

## 2021-02-01 MED ORDER — AMLODIPINE BESYLATE 10 MG PO TABS: 10.0000 mg | ORAL_TABLET | Freq: Every day | ORAL | 3 refills | Status: DC

## 2021-02-01 NOTE — Assessment & Plan Note (Signed)
Unclear etiology of low BP. She would like to resume amlodipine 10 mg daily which had previously been effective for BP without side effects (we had concerns leg swelling may have been caused by amlodipine but has not changed off medication). Stop hctz and resume amlodipine 10 mg daily (2 of the 5 mg tablets she just picked up).

## 2021-02-08 ENCOUNTER — Encounter: Payer: Self-pay | Admitting: Nurse Practitioner

## 2021-02-08 ENCOUNTER — Ambulatory Visit: Payer: Medicare HMO | Admitting: Nurse Practitioner

## 2021-02-08 VITALS — BP 126/52 | HR 96 | Ht 63.0 in

## 2021-02-08 DIAGNOSIS — K219 Gastro-esophageal reflux disease without esophagitis: Secondary | ICD-10-CM | POA: Diagnosis not present

## 2021-02-08 MED ORDER — FAMOTIDINE 40 MG PO TABS
40.0000 mg | ORAL_TABLET | Freq: Every day | ORAL | 3 refills | Status: DC
Start: 2021-02-08 — End: 2022-01-01

## 2021-02-08 MED ORDER — PANTOPRAZOLE SODIUM 40 MG PO TBEC
40.0000 mg | DELAYED_RELEASE_TABLET | Freq: Two times a day (BID) | ORAL | 3 refills | Status: DC
Start: 2021-02-08 — End: 2022-01-01

## 2021-02-08 NOTE — Patient Instructions (Signed)
If you are age 81 or older, your body mass index should be between 23-30. Your Body mass index is 25.37 kg/m. If this is out of the aforementioned range listed, please consider follow up with your Primary Care Provider.  If you are age 74 or younger, your body mass index should be between 19-25. Your Body mass index is 25.37 kg/m. If this is out of the aformentioned range listed, please consider follow up with your Primary Care Provider.   Please continue current medications.  It was a pleasure to see you today!  Thank you for trusting me with your gastrointestinal care!

## 2021-02-08 NOTE — Progress Notes (Signed)
I agree with the above note, plan 

## 2021-02-08 NOTE — Progress Notes (Signed)
ASSESSMENT AND PLAN    # GERD/history of Schatzki's ring.  Recent flare in reflux symptoms after starting HCTZ.  GERD symptoms now back under control after discontinuation of HCTZ. --Anti-reflux measures discussed including avoidance of evening meals and caffeine -- Continue current antireflux medications including twice daily pantoprazole and Pepcid at bedtime --follow up as needed   # History of adenomatous colon polyps.  2 small tubular adenomas removed at time of last colonoscopy January 2018.  Given age, patient no longer undergoing polyp surveillance colonoscopies  HISTORY OF PRESENT ILLNESS    Chief Complaint : reflux flare, better now.   Jenny Madden is a 80 y.o. female known to Dr. Ardis Hughs for a history of diverticulosis, adenomatous colon polyps, hiatal hernia , GERD / Schatzki's ring. She also has a past medical history of hypertension, hypertrophic obstructive cardiomyopathy,  hyperlipidemia, CKD, renal cancer. See PMH below for any additional medical problems.    Jenny Madden made this appointment a few weeks ago when she was having a flare in her reflux symptoms.  She attributes the flare to the addition of HCTZ to home medications.  Patient discussed this with her PCP, HCT was discontinued and she was started on amlodipine.  Since discontinuation of HCTZ her reflux symptoms are back under good control with pantoprazole and Pepcid. She has no other GI complaints.  She has not had any recurrent dysphagia  DATA REVIEWED: 12/20/2020 CBC normal  PREVIOUS ENDOSCOPIC EVALUATIONS / PERTINENT STUDIES:   January 2018 screening colonoscopy increased risk with [facility] of colon cancer in brother -- Complete exam, good prep -- 2 sessile polyps 5 to 8 mm in size removed.  Left-sided diverticulosis.  Path-tubular adenomas x 09 December 2019 EGD for dysphagia -- Benign-appearing, intrinsic mild stenosis at GE junction.  Dilation with 18, 19 and 20 mm balloon dilator was performed.   Exam otherwise normal    Past Medical History:  Diagnosis Date  . Allergic rhinitis, cause unspecified   . Anxiety state, unspecified    none recent  . Benign neoplasm of colon   . Cancer ([place])    renal mass  . Cataract   . Chronic kidney disease 01/2016   Tumor left side  . Diverticulosis of colon (without mention of hemorrhage)   . Family history of adverse reaction to anesthesia 3 yrs ago   slow to awaken   . Family history of malignant neoplasm of gastrointestinal tract   . Heart murmur   . Hypertension   . Nonspecific (abnormal) findings on radiological and other examination of other intrathoracic organs   . Nonspecific abnormal electrocardiogram (ECG) (EKG)   . Osteoporosis   . Pure hypercholesterolemia   . Type II or unspecified type diabetes mellitus without mention of complication, not stated as uncontrolled   . Unspecified essential hypertension   . Unspecified tinnitus years ago    Current Medications, Allergies, Past Surgical History, Family History and Social History were reviewed in [employer] record.   Current Outpatient Medications  Medication Sig Dispense Refill  . acetaminophen (TYLENOL) 325 MG tablet Take 2 tablets (650 mg total) by mouth every 6 (six) hours as needed for moderate pain. 30 tablet 0  . amLODipine (NORVASC) 10 MG tablet Take 1 tablet (10 mg total) by mouth daily. 90 tablet 3  . aspirin 81 MG tablet Take 81 mg by mouth every morning.    Marland Kitchen atorvastatin (LIPITOR) 20 MG tablet Take 1 tablet (20 mg total) by  mouth daily. 90 tablet 0  . blood glucose meter kit and supplies Dispense based on patient and insurance preference. Use up to four times daily as directed. (E11.9). 1 each 0  . Blood Glucose Monitoring Suppl (ONE TOUCH ULTRA 2) w/Device KIT USE TO CHECK BLOOD SUGAR   TWO TIMES A DAY 1 kit 0  . Cholecalciferol (VITAMIN D) 2000 UNITS CAPS Take 2,000 Units by mouth daily.    . famotidine (PEPCID) 40 MG tablet Take 1  tablet (40 mg total) by mouth at bedtime. 90 tablet 3  . fexofenadine (ALLEGRA) 180 MG tablet Take 1 tablet (180 mg total) by mouth daily. Take 1 tablet by mouth once daily as needed for allergies 90 tablet 3  . fluticasone (FLONASE) 50 MCG/ACT nasal spray Place 2 sprays into both nostrils daily. 48 g 3  . glucose blood (ONETOUCH ULTRA) test strip CHECK BLOOD SUGAR TWO TIMESA DAY 100 strip 2  . Lancets (ONETOUCH DELICA PLUS [id number]) MISC CHECK BLOOD SUGAR TWO TIMESA DAY. 200 each 1  . pantoprazole (PROTONIX) 40 MG tablet Take 1 tablet (40 mg total) by mouth 2 (two) times daily before a meal. 180 tablet 3  . polyethylene glycol powder (GLYCOLAX/MIRALAX) 17 GM/SCOOP powder Take 17 g by mouth 2 (two) times daily as needed. 3350 g 1   No current facility-administered medications for this visit.    Review of Systems: No chest pain. No shortness of breath. No urinary complaints.   PHYSICAL EXAM :    Wt Readings from Last 3 Encounters:  01/30/21 143 lb 3.2 oz (65 kg)  01/24/21 146 lb 12.8 oz (66.6 kg)  12/27/20 145 lb 3.2 oz (65.9 kg)    BP (!) 126/52   Pulse 96   Ht _0  (1.6 m)   BMI 25.37 kg/m  Constitutional:  Pleasant female in no acute distress. Psychiatric: Normal mood and affect. Behavior is normal. EENT: Pupils normal.  Conjunctivae are normal. No scleral icterus. Neck supple.  Cardiovascular: Normal rate, regular rhythm, murmur present, 1+ BLE edema Pulmonary/chest: Effort normal and breath sounds normal. No wheezing, rales or rhonchi. Abdominal: Soft, nondistended, nontender. Bowel sounds active throughout. There are no masses palpable. No hepatomegaly. Neurological: Alert and oriented to person place and time. Skin: Skin is warm and dry. No rashes noted.  Jenny Savoy, NP  02/08/2021, 9:47 AM

## 2021-02-21 ENCOUNTER — Other Ambulatory Visit: Payer: Self-pay | Admitting: Internal Medicine

## 2021-03-21 ENCOUNTER — Encounter: Payer: Self-pay | Admitting: Internal Medicine

## 2021-03-21 ENCOUNTER — Other Ambulatory Visit: Payer: Self-pay

## 2021-03-21 ENCOUNTER — Ambulatory Visit (INDEPENDENT_AMBULATORY_CARE_PROVIDER_SITE_OTHER): Payer: Medicare HMO | Admitting: Internal Medicine

## 2021-03-21 VITALS — BP 136/72 | HR 89 | Temp 98.0°F | Ht 63.0 in | Wt 148.2 lb

## 2021-03-21 DIAGNOSIS — I10 Essential (primary) hypertension: Secondary | ICD-10-CM | POA: Diagnosis not present

## 2021-03-21 DIAGNOSIS — I700 Atherosclerosis of aorta: Secondary | ICD-10-CM

## 2021-03-21 DIAGNOSIS — E118 Type 2 diabetes mellitus with unspecified complications: Secondary | ICD-10-CM | POA: Diagnosis not present

## 2021-03-21 LAB — POCT GLYCOSYLATED HEMOGLOBIN (HGB A1C): Hemoglobin A1C: 5.9 % — AB (ref 4.0–5.6)

## 2021-03-21 NOTE — Progress Notes (Signed)
   Subjective:   Patient ID: Jenny Madden, female    DOB: Jul 06, 1941, 80 y.o.   MRN: [id number]  HPI The patient is an 80 YO female coming in for follow up diabetes. Diet controlled and no low or high sugar symptoms noted. Denies new numbness or tingling in feet. Some mild swelling in the feet/ankles stable from amlodipine. Doing well with amlodipine 10 mg daily on BP.   Review of Systems  Constitutional: Negative.   HENT: Negative.    Eyes: Negative.   Respiratory:  Negative for cough and chest tightness.   Cardiovascular:  Negative for leg swelling.  Gastrointestinal:  Negative for abdominal distention, abdominal pain, constipation, diarrhea, nausea and vomiting.  Musculoskeletal: Negative.   Skin: Negative.   Neurological: Negative.   Psychiatric/Behavioral: Negative.     Objective:  Physical Exam Constitutional:      Appearance: She is well-developed.  HENT:     Head: Normocephalic and atraumatic.  Cardiovascular:     Rate and Rhythm: Normal rate and regular rhythm.  Pulmonary:     Effort: Pulmonary effort is normal. No respiratory distress.     Breath sounds: Normal breath sounds. No wheezing or rales.  Abdominal:     General: Bowel sounds are normal. There is no distension.     Palpations: Abdomen is soft.     Tenderness: There is no abdominal tenderness. There is no rebound.  Musculoskeletal:     Cervical back: Normal range of motion.  Skin:    General: Skin is warm and dry.  Neurological:     Mental Status: She is alert and oriented to person, place, and time.     Coordination: Coordination normal.    Vitals:   03/21/21 0959  BP: 136/72  Pulse: 89  Temp: 98 F (36.7 C)  TempSrc: Oral  SpO2: 97%  Weight: 148 lb 3.2 oz (67.2 kg)  Height: 5\' 3"  (1.6 m)    This visit occurred during the SARS-CoV-2 public health emergency.  Safety protocols were in place, including screening questions prior to the visit, additional usage of staff PPE, and extensive  cleaning of exam room while observing appropriate contact time as indicated for disinfecting solutions.   Assessment & Plan:

## 2021-03-21 NOTE — Assessment & Plan Note (Signed)
Noted on CT abdomen 2021. On lipitor 20 mg daily and will continue.

## 2021-03-21 NOTE — Assessment & Plan Note (Signed)
Doing well on amlodipine 10 mg daily. No more episodes of dizziness and BP at goal. Still some rare BP 110/60 but without symptoms.

## 2021-03-21 NOTE — Patient Instructions (Addendum)
We will check the HgA1c today and it is 5.9.

## 2021-03-21 NOTE — Assessment & Plan Note (Signed)
POC HgA1c done at 5.9. Diet controlled currently. On lipitor and monitoring yearly microalbumin to creatinine ratio as not on ACE-I or ARB.

## 2021-04-05 ENCOUNTER — Telehealth: Payer: Self-pay | Admitting: Internal Medicine

## 2021-04-05 MED ORDER — ONETOUCH ULTRA 2 W/DEVICE KIT
PACK | 0 refills | Status: DC
Start: 2021-04-05 — End: 2023-12-10

## 2021-04-05 NOTE — Telephone Encounter (Signed)
Medication was sent to the patient's pharmacy.

## 2021-04-05 NOTE — Telephone Encounter (Signed)
1.Medication Requested: Blood Glucose Monitoring Suppl (ONE TOUCH ULTRA 2) w/Device KIT   2. Pharmacy (Name, Street, [place]): [place], [place] to [facility]istered [facility]  3. On Med List: yes   4. Last Visit with PCP: 03-21-21  5. Next visit date with PCP: 09-26-21   Agent: Please be advised that RX refills may take up to 3 business days. We ask that you follow-up with your pharmacy.

## 2021-04-13 ENCOUNTER — Ambulatory Visit (HOSPITAL_COMMUNITY)
Admission: RE | Admit: 2021-04-13 | Discharge: 2021-04-13 | Disposition: A | Discharge status: Home / Self Care | Payer: Medicare HMO | Source: Ambulatory Visit | Attending: Urology | Admitting: Urology

## 2021-04-13 ENCOUNTER — Other Ambulatory Visit: Payer: Self-pay

## 2021-04-13 ENCOUNTER — Other Ambulatory Visit (HOSPITAL_COMMUNITY): Payer: Self-pay | Admitting: Urology

## 2021-04-13 DIAGNOSIS — D49512 Neoplasm of unspecified behavior of left kidney: Secondary | ICD-10-CM | POA: Diagnosis not present

## 2021-04-13 DIAGNOSIS — J929 Pleural plaque without asbestos: Secondary | ICD-10-CM | POA: Diagnosis not present

## 2021-04-13 DIAGNOSIS — C642 Malignant neoplasm of left kidney, except renal pelvis: Secondary | ICD-10-CM | POA: Diagnosis not present

## 2021-04-13 DIAGNOSIS — M419 Scoliosis, unspecified: Secondary | ICD-10-CM | POA: Diagnosis not present

## 2021-04-13 DIAGNOSIS — R918 Other nonspecific abnormal finding of lung field: Secondary | ICD-10-CM | POA: Diagnosis not present

## 2021-04-19 DIAGNOSIS — M4186 Other forms of scoliosis, lumbar region: Secondary | ICD-10-CM | POA: Diagnosis not present

## 2021-04-19 DIAGNOSIS — D49512 Neoplasm of unspecified behavior of left kidney: Secondary | ICD-10-CM | POA: Diagnosis not present

## 2021-04-19 DIAGNOSIS — K5730 Diverticulosis of large intestine without perforation or abscess without bleeding: Secondary | ICD-10-CM | POA: Diagnosis not present

## 2021-04-19 DIAGNOSIS — M47816 Spondylosis without myelopathy or radiculopathy, lumbar region: Secondary | ICD-10-CM | POA: Diagnosis not present

## 2021-04-19 DIAGNOSIS — K449 Diaphragmatic hernia without obstruction or gangrene: Secondary | ICD-10-CM | POA: Diagnosis not present

## 2021-04-21 DIAGNOSIS — D49512 Neoplasm of unspecified behavior of left kidney: Secondary | ICD-10-CM | POA: Diagnosis not present

## 2021-05-14 ENCOUNTER — Other Ambulatory Visit: Payer: Self-pay | Admitting: Internal Medicine

## 2021-05-19 ENCOUNTER — Telehealth: Payer: Self-pay | Admitting: Internal Medicine

## 2021-05-19 MED ORDER — ONETOUCH ULTRA VI STRP
ORAL_STRIP | 2 refills | Status: DC
Start: 2021-05-19 — End: 2021-12-21

## 2021-05-19 NOTE — Telephone Encounter (Signed)
1.Medication Requested: glucose blood (ONETOUCH ULTRA) test strip  2. Pharmacy (Name, Street, [place]): [place], [place] to Registered [place] Sites  Phone:  [phone] Fax:  [phone]   3. On Med List: yes  4. Last Visit with PCP: 06.14.22  5. Next visit date with PCP: 12.20.22   Agent: Please be advised that RX refills may take up to 3 business days. We ask that you follow-up with your pharmacy.

## 2021-05-19 NOTE — Telephone Encounter (Signed)
Medication has been sent to the patient's pharmacy.  

## 2021-06-27 ENCOUNTER — Encounter: Payer: Self-pay | Admitting: Internal Medicine

## 2021-07-04 DIAGNOSIS — H02826 Cysts of left eye, unspecified eyelid: Secondary | ICD-10-CM | POA: Diagnosis not present

## 2021-07-04 DIAGNOSIS — E119 Type 2 diabetes mellitus without complications: Secondary | ICD-10-CM | POA: Diagnosis not present

## 2021-07-04 DIAGNOSIS — H524 Presbyopia: Secondary | ICD-10-CM | POA: Diagnosis not present

## 2021-07-04 DIAGNOSIS — Z961 Presence of intraocular lens: Secondary | ICD-10-CM | POA: Diagnosis not present

## 2021-07-04 DIAGNOSIS — H40023 Open angle with borderline findings, high risk, bilateral: Secondary | ICD-10-CM | POA: Diagnosis not present

## 2021-07-06 ENCOUNTER — Telehealth: Payer: Self-pay | Admitting: Internal Medicine

## 2021-07-06 NOTE — Telephone Encounter (Signed)
Left message for patient to call me back at [phone] to schedule Medicare Annual Wellness Visit   Last AWV  03/23/20  Please schedule at anytime with [facility] if patient calls the office back.    40 Minutes appointment   Any questions, please call me at [phone]

## 2021-07-18 ENCOUNTER — Ambulatory Visit (INDEPENDENT_AMBULATORY_CARE_PROVIDER_SITE_OTHER): Payer: Medicare HMO

## 2021-07-18 DIAGNOSIS — Z1239 Encounter for other screening for malignant neoplasm of breast: Secondary | ICD-10-CM | POA: Diagnosis not present

## 2021-07-18 DIAGNOSIS — Z0000 Encounter for general adult medical examination without abnormal findings: Secondary | ICD-10-CM | POA: Diagnosis not present

## 2021-07-18 NOTE — Patient Instructions (Signed)
Jenny Madden , Thank you for taking time to come for your Medicare Wellness Visit. I appreciate your ongoing commitment to your health goals. Please review the following plan we discussed and let me know if I can assist you in the future.   Screening recommendations/referrals: Colonoscopy: Not a candidate for screening due to age 80: order placed 07/18/2021 Bone Density: 10/03/2017; no longer recommended Recommended yearly ophthalmology/optometry visit for glaucoma screening and checkup Recommended yearly dental visit for hygiene and checkup  Vaccinations: Influenza vaccine: 06/2021 per patient Pneumococcal vaccine: 06/28/2009, 01/12/2015 Tdap vaccine: 01/08/2020; due every 10 years Shingles vaccine: never done   Covid-19: 11/13/2019, 12/08/2019, 02/20/2021  Advanced directives: Yes; documents on file  Conditions/risks identified: Yes; Client understands the importance of follow-up with providers by attending scheduled visits and discussed goals to eat healthier, increase physical activity, exercise the brain, socialize more, get enough sleep and make time for laughter.  Next appointment: Please schedule your next Medicare Wellness Visit with your Nurse Health Advisor in 1 year by calling [phone].   Preventive Care 81 Years and Older, Female Preventive care refers to lifestyle choices and visits with your health care provider that can promote health and wellness. What does preventive care include? A yearly physical exam. This is also called an annual well check. Dental exams once or twice a year. Routine eye exams. Ask your health care provider how often you should have your eyes checked. Personal lifestyle choices, including: Daily care of your teeth and gums. Regular physical activity. Eating a healthy diet. Avoiding tobacco and drug use. Limiting alcohol use. Practicing safe sex. Taking low-dose aspirin every day. Taking vitamin and mineral supplements as recommended by your  health care provider. What happens during an annual well check? The services and screenings done by your health care provider during your annual well check will depend on your age, overall health, lifestyle risk factors, and family history of disease. Counseling  Your health care provider may ask you questions about your: Alcohol use. Tobacco use. Drug use. Emotional well-being. Home and relationship well-being. Sexual activity. Eating habits. History of falls. Memory and ability to understand (cognition). Work and work Statistician. Reproductive health. Screening  You may have the following tests or measurements: Height, weight, and BMI. Blood pressure. Lipid and cholesterol levels. These may be checked every 5 years, or more frequently if you are over 50 years old. Skin check. Lung cancer screening. You may have this screening every year starting at age 64 if you have a 30-pack-year history of smoking and currently smoke or have quit within the past 15 years. Fecal occult blood test (FOBT) of the stool. You may have this test every year starting at age 80. Flexible sigmoidoscopy or colonoscopy. You may have a sigmoidoscopy every 5 years or a colonoscopy every 10 years starting at age 67. Hepatitis C blood test. Hepatitis B blood test. Sexually transmitted disease (STD) testing. Diabetes screening. This is done by checking your blood sugar (glucose) after you have not eaten for a while (fasting). You may have this done every 1-3 years. Bone density scan. This is done to screen for osteoporosis. You may have this done starting at age 74. Mammogram. This may be done every 1-2 years. Talk to your health care provider about how often you should have regular mammograms. Talk with your health care provider about your test results, treatment options, and if necessary, the need for more tests. Vaccines  Your health care provider may recommend certain vaccines, such as: Influenza  vaccine.  This is recommended every year. Tetanus, diphtheria, and acellular pertussis (Tdap, Td) vaccine. You may need a Td booster every 10 years. Zoster vaccine. You may need this after age 16. Pneumococcal 13-valent conjugate (PCV13) vaccine. One dose is recommended after age 81. Pneumococcal polysaccharide (PPSV23) vaccine. One dose is recommended after age 81. Talk to your health care provider about which screenings and vaccines you need and how often you need them. This information is not intended to replace advice given to you by your health care provider. Make sure you discuss any questions you have with your health care provider. Document Released: 10/21/2015 Document Revised: 06/13/2016 Document Reviewed: 07/26/2015 Elsevier Interactive Patient Education  2017 [place] Prevention in the Home Falls can cause injuries. They can happen to people of all ages. There are many things you can do to make your home safe and to help prevent falls. What can I do on the outside of my home? Regularly fix the edges of walkways and driveways and fix any cracks. Remove anything that might make you trip as you walk through a door, such as a raised step or threshold. Trim any bushes or trees on the path to your home. Use bright outdoor lighting. Clear any walking paths of anything that might make someone trip, such as rocks or tools. Regularly check to see if handrails are loose or broken. Make sure that both sides of any steps have handrails. Any raised decks and porches should have guardrails on the edges. Have any leaves, snow, or ice cleared regularly. Use sand or salt on walking paths during winter. Clean up any spills in your garage right away. This includes oil or grease spills. What can I do in the bathroom? Use night lights. Install grab bars by the toilet and in the tub and shower. Do not use towel bars as grab bars. Use non-skid mats or decals in the tub or shower. If you need to sit  down in the shower, use a plastic, non-slip stool. Keep the floor dry. Clean up any water that spills on the floor as soon as it happens. Remove soap buildup in the tub or shower regularly. Attach bath mats securely with double-sided non-slip rug tape. Do not have throw rugs and other things on the floor that can make you trip. What can I do in the bedroom? Use night lights. Make sure that you have a light by your bed that is easy to reach. Do not use any sheets or blankets that are too big for your bed. They should not hang down onto the floor. Have a firm chair that has side arms. You can use this for support while you get dressed. Do not have throw rugs and other things on the floor that can make you trip. What can I do in the kitchen? Clean up any spills right away. Avoid walking on wet floors. Keep items that you use a lot in easy-to-reach places. If you need to reach something above you, use a strong step stool that has a grab bar. Keep electrical cords out of the way. Do not use floor polish or wax that makes floors slippery. If you must use wax, use non-skid floor wax. Do not have throw rugs and other things on the floor that can make you trip. What can I do with my stairs? Do not leave any items on the stairs. Make sure that there are handrails on both sides of the stairs and use them. Fix  handrails that are broken or loose. Make sure that handrails are as long as the stairways. Check any carpeting to make sure that it is firmly attached to the stairs. Fix any carpet that is loose or worn. Avoid having throw rugs at the top or bottom of the stairs. If you do have throw rugs, attach them to the floor with carpet tape. Make sure that you have a light switch at the top of the stairs and the bottom of the stairs. If you do not have them, ask someone to add them for you. What else can I do to help prevent falls? Wear shoes that: Do not have high heels. Have rubber bottoms. Are  comfortable and fit you well. Are closed at the toe. Do not wear sandals. If you use a stepladder: Make sure that it is fully opened. Do not climb a closed stepladder. Make sure that both sides of the stepladder are locked into place. Ask someone to hold it for you, if possible. Clearly mark and make sure that you can see: Any grab bars or handrails. First and last steps. Where the edge of each step is. Use tools that help you move around (mobility aids) if they are needed. These include: Canes. Walkers. Scooters. Crutches. Turn on the lights when you go into a dark area. Replace any light bulbs as soon as they burn out. Set up your furniture so you have a clear path. Avoid moving your furniture around. If any of your floors are uneven, fix them. If there are any pets around you, be aware of where they are. Review your medicines with your doctor. Some medicines can make you feel dizzy. This can increase your chance of falling. Ask your doctor what other things that you can do to help prevent falls. This information is not intended to replace advice given to you by your health care provider. Make sure you discuss any questions you have with your health care provider. Document Released: 07/21/2009 Document Revised: 03/01/2016 Document Reviewed: 10/29/2014 Elsevier Interactive Patient Education  2017 [employer].

## 2021-07-18 NOTE — Progress Notes (Signed)
I connected with Jenny Madden today by telephone and verified that I am speaking with the correct person using two identifiers. Location patient: home Location provider: work Persons participating in the virtual visit: patient, provider.   I discussed the limitations, risks, security and privacy concerns of performing an evaluation and management service by telephone and the availability of in person appointments. I also discussed with the patient that there may be a patient responsible charge related to this service. The patient expressed understanding and verbally consented to this telephonic visit.    Interactive audio and video telecommunications were attempted between this provider and patient, however failed, due to patient having technical difficulties OR patient did not have access to video capability.  We continued and completed visit with audio only.  Some vital signs may be absent or patient reported.   Time Spent with patient on telephone encounter: 40 minutes  Subjective:   Jenny Madden is a 80 y.o. female who presents for Medicare Annual (Subsequent) preventive examination.  Review of Systems     Cardiac Risk Factors include: advanced age (>60mn, >>42women);diabetes mellitus;dyslipidemia;hypertension     Objective:    There were no vitals filed for this visit. There is no height or weight on file to calculate BMI.  Advanced Directives 07/18/2021 08/23/2020 03/23/2020 12/29/2019 12/05/2019 02/05/2019 12/24/2018  Does Patient Have a Medical Advance Directive? Yes Yes Yes No No Yes No  Type of Advance Directive - [place] will - - - H[place]omiLiving will -  Does patient want to make changes to medical advance directive? No - Patient declined - No - Patient declined - - - -  Copy of HPress photographerin Chart? - - - - - No - copy requested -  Would patient like information on creating a medical advance directive?  - - - No - Patient declined No - Patient declined - No - Patient declined    Current Medications (verified) Outpatient Encounter Medications as of 07/18/2021  Medication Sig   acetaminophen (TYLENOL) 325 MG tablet Take 2 tablets (650 mg total) by mouth every 6 (six) hours as needed for moderate pain.   amLODipine (NORVASC) 10 MG tablet Take 1 tablet (10 mg total) by mouth daily.   aspirin 81 MG tablet Take 81 mg by mouth every morning.   atorvastatin (LIPITOR) 20 MG tablet TAKE 1 TABLET DAILY   blood glucose meter kit and supplies Dispense based on patient and insurance preference. Use up to four times daily as directed. (E11.9).   Blood Glucose Monitoring Suppl (ONE TOUCH ULTRA 2) w/Device KIT USE TO CHECK BLOOD SUGAR   TWO TIMES A DAY   Cholecalciferol (VITAMIN D) 2000 UNITS CAPS Take 2,000 Units by mouth daily.   famotidine (PEPCID) 40 MG tablet Take 1 tablet (40 mg total) by mouth at bedtime.   fexofenadine (ALLEGRA) 180 MG tablet Take 1 tablet (180 mg total) by mouth daily. Take 1 tablet by mouth once daily as needed for allergies (Patient taking differently: Take 180 mg by mouth daily as needed.)   fluticasone (FLONASE) 50 MCG/ACT nasal spray Place 2 sprays into both nostrils daily.   glucose blood (ONETOUCH ULTRA) test strip CHECK BLOOD SUGAR TWO TIMESA DAY   Lancets (ONETOUCH DELICA PLUS [id number] MISC CHECK BLOOD SUGAR TWO TIMESA DAY.   pantoprazole (PROTONIX) 40 MG tablet Take 1 tablet (40 mg total) by mouth 2 (two) times daily before a meal.   polyethylene glycol powder (GLYCOLAX/MIRALAX)  17 GM/SCOOP powder Take 17 g by mouth 2 (two) times daily as needed.   No facility-administered encounter medications on file as of 07/18/2021.    Allergies (verified) Patient has no known allergies.   History: Past Medical History:  Diagnosis Date   Allergic rhinitis, cause unspecified    Anxiety state, unspecified    none recent   Benign neoplasm of colon    Cancer ([place])    renal  mass   Cataract    Chronic kidney disease 01/2016   Tumor left side   Diverticulosis of colon (without mention of hemorrhage)    Dysphagia    Family history of adverse reaction to anesthesia 3 yrs ago   slow to awaken    Family history of malignant neoplasm of gastrointestinal tract    Heart murmur    Hypertension    Nonspecific (abnormal) findings on radiological and other examination of other intrathoracic organs    Nonspecific abnormal electrocardiogram (ECG) (EKG)    Osteoporosis    Other dysphagia    Pure hypercholesterolemia    Type II or unspecified type diabetes mellitus without mention of complication, not stated as uncontrolled    Unspecified essential hypertension    Unspecified tinnitus years ago   Past Surgical History:  Procedure Laterality Date   CATARACT EXTRACTION     CESAREAN SECTION     x 1   COLONOSCOPY     colonscopy and endoscopy  5 yrs ago   FOOT SURGERY     bilateral for hammer toes   IR GENERIC HISTORICAL  12/06/2015   IR RADIOLOGIST EVAL & MGMT 12/06/2015 Aletta Edouard, MD [facility] INTERV RAD   IR GENERIC HISTORICAL  05/03/2016   IR RADIOLOGIST EVAL & MGMT 05/03/2016 Aletta Edouard, MD [facility] INTERV RAD   IR GENERIC HISTORICAL  02/09/2016   IR RADIOLOGIST EVAL & MGMT 02/09/2016 [facility] INTERV RAD   IR RADIOLOGIST EVAL & MGMT  01/09/2017   IR RADIOLOGIST EVAL & MGMT  01/14/2018   IR RADIOLOGIST EVAL & MGMT  03/31/2019   IR RADIOLOGIST EVAL & MGMT  03/30/2020   RADIOFREQUENCY ABLATION KIDNEY Left 01/2016   Renal mass   UPPER GASTROINTESTINAL ENDOSCOPY     Family History  Problem Relation Age of Onset   Colon cancer Brother 33   Esophageal cancer Neg Hx    Rectal cancer Neg Hx    Stomach cancer Neg Hx    Social History   Socioeconomic History   Marital status: Widowed    Spouse name: Jonathon Bellows x 46 yrs   Number of children: 1   Years of education: 14   Highest education level: High school graduate  Occupational History   Occupation: Retired  Tobacco Use    Smoking status: Former    Packs/day: 0.50    Years: 30.00    Pack years: 15.00    Types: Cigarettes    Quit date: 10/09/1999    Years since quitting: 21.7   Smokeless tobacco: Never  Vaping Use   Vaping Use: Never used  Substance and Sexual Activity   Alcohol use: Yes    Alcohol/week: 1.0 standard drink    Types: 1 Standard drinks or equivalent per week    Comment: social use   Drug use: No   Sexual activity: Not Currently    Comment: 1st intercourse- 21, partners- 3, widow   Other Topics Concern   Not on file  Social History Narrative   Widowed, Alonzo x 46 yrs   1 child,  daughter, 2 grandddaughter   High school education   Live alone; 1 story home   Social Determinants of Health   Financial Resource Strain: Low Risk    Difficulty of Paying Living Expenses: Not hard at all  Food Insecurity: No Food Insecurity   Worried About Charity fundraiser in the Last Year: Never true   Arboriculturist in the Last Year: Never true  Transportation Needs: No Transportation Needs   Lack of Transportation (Medical): No   Lack of Transportation (Non-Medical): No  Physical Activity: Sufficiently Active   Days of Exercise per Week: 5 days   Minutes of Exercise per Session: 30 min  Stress: No Stress Concern Present   Feeling of Stress : Not at all  Social Connections: Moderately Integrated   Frequency of Communication with Friends and Family: More than three times a week   Frequency of Social Gatherings with Friends and Family: More than three times a week   Attends Religious Services: More than 4 times per year   Active Member of Clubs or Organizations: No   Attends Music therapist: More than 4 times per year   Marital Status: Widowed    Tobacco Counseling Counseling given: Not Answered   Clinical Intake:  Pre-visit preparation completed: Yes  Pain : No/denies pain     Nutritional Risks: None Diabetes: Yes CBG done?: No Did pt. bring in CBG monitor from  home?: No  How often do you need to have someone help you when you read instructions, pamphlets, or other written materials from your doctor or pharmacy?: 1 - Never What is the last grade level you completed in school?: 2 years of college  Diabetic? yes  Interpreter Needed?: No  Information entered by :: Lisette Abu, LPN   Activities of Daily Living In your present state of health, do you have any difficulty performing the following activities: 07/18/2021  Hearing? N  Vision? N  Difficulty concentrating or making decisions? N  Walking or climbing stairs? N  Dressing or bathing? N  Doing errands, shopping? N  Preparing Food and eating ? N  Using the Toilet? N  In the past six months, have you accidently leaked urine? N  Do you have problems with loss of bowel control? N  Managing your Medications? N  Managing your Finances? N  Housekeeping or managing your Housekeeping? N  Some recent data might be hidden    Patient Care Team: Hoyt Koch, MD as PCP - General (Internal Medicine) [name], MD as PCP - Cardiology (Cardiology) Milus Banister, MD as Attending Physician (Gastroenterology) Konrad Felix, MD as Referring Physician (Ophthalmology) Thamas Jaegers, RMA Charlton Haws, [facility] as Pharmacist (Pharmacist) Princess Bruins, MD as Consulting Physician (Obstetrics and Gynecology)  Indicate any recent Medical Services you may have received from other than [facility] providers in the past year (date may be approximate).     Assessment:   This is a routine wellness examination for Jenny Madden.  Hearing/Vision screen Hearing Screening - Comments:: Patient denied any hearing difficulty.   No hearing aids.  Vision Screening - Comments:: Patient wears readers for close up vision.  Eye exam done annually by: [facility]  Dietary issues and exercise activities discussed: Current Exercise Habits: Home exercise routine, Type of exercise:  walking, Time (Minutes): 30, Frequency (Times/Week): 5, Weekly Exercise (Minutes/Week): 150, Intensity: Moderate, Exercise limited by: None identified   Goals Addressed   None   Depression Screen PHQ  2/9 Scores 07/18/2021 03/21/2021 03/23/2020 03/17/2020 02/05/2019 09/10/2018 09/03/2017  PHQ - 2 Score 0 0 0 0 1 0 1    Fall Risk Fall Risk  07/18/2021 03/21/2021 03/23/2020 03/17/2020 02/05/2019  Falls in the past year? 0 1 0 0 0  Number falls in past yr: 0 0 0 - 0  Injury with Fall? 0 0 0 - -  Risk for fall due to : No Fall Risks No Fall Risks No Fall Risks - -  Follow up Falls evaluation completed Falls evaluation completed Falls evaluation completed;Education provided - Falls prevention discussed    FALL RISK PREVENTION PERTAINING TO THE HOME:  Any stairs in or around the home? No  If so, are there any without handrails? No  Home free of loose throw rugs in walkways, pet beds, electrical cords, etc? Yes  Adequate lighting in your home to reduce risk of falls? Yes   ASSISTIVE DEVICES UTILIZED TO PREVENT FALLS:  Life alert? No  Use of a cane, walker or w/c? No  Grab bars in the bathroom? Yes  Shower chair or bench in shower? No  Elevated toilet seat or a handicapped toilet? Yes   TIMED UP AND GO:  Was the test performed? No .  Length of time to ambulate 10 feet: n/a sec.   Gait steady and fast without use of assistive device  Cognitive Function: Normal cognitive status assessed by direct observation by this Nurse Health Advisor. No abnormalities found.       6CIT Screen 03/23/2020  What Year? 0 points  What month? 0 points  What time? 0 points  Count back from 20 0 points  Months in reverse 0 points  Repeat phrase 0 points  Total Score 0    Immunizations Immunization History  Administered Date(s) Administered   Fluad Quad(high Dose 65+) 06/11/2019   H1N1 09/30/2008   Influenza Split 06/28/2011, 07/02/2012   Influenza Whole 06/30/2008, 07/15/2009, 06/28/2010    Influenza, High Dose Seasonal PF 06/21/2016, 07/05/2017, 06/24/2020   Influenza,inj,Quad PF,6+ Mos 07/07/2013, 07/09/2014, 07/13/2015   Influenza-Unspecified 06/02/2018   PFIZER(Purple Top)SARS-COV-2 Vaccination 11/13/2019, 12/08/2019, 07/21/2020   Pneumococcal Conjugate-13 01/12/2015   Pneumococcal Polysaccharide-23 06/28/2009   Td 12/28/2009   Tdap 01/08/2020    TDAP status: Up to date  Flu Vaccine status: Up to date  Pneumococcal vaccine status: Up to date  Covid-19 vaccine status: Completed vaccines  Qualifies for Shingles Vaccine? Yes   Zostavax completed No   Shingrix Completed?: No.    Education has been provided regarding the importance of this vaccine. Patient has been advised to call insurance company to determine out of pocket expense if they have not yet received this vaccine. Advised may also receive vaccine at local pharmacy or Health Dept. Verbalized acceptance and understanding.  Screening Tests Health Maintenance  Topic Date Due   Zoster Vaccines- Shingrix (1 of 2) Never done   COVID-19 Vaccine (4 - Booster for Pfizer series) 11/21/2020   INFLUENZA VACCINE  05/08/2021   OPHTHALMOLOGY EXAM  06/28/2021   FOOT EXAM  09/20/2021   HEMOGLOBIN A1C  09/20/2021   URINE MICROALBUMIN  09/20/2021   COLONOSCOPY (Pts 45-34yr Insurance coverage will need to be confirmed)  11/07/2021   TETANUS/TDAP  01/07/2030   DEXA SCAN  Completed   HPV VACCINES  Aged Out    Health Maintenance  Health Maintenance Due  Topic Date Due   Zoster Vaccines- Shingrix (1 of 2) Never done   COVID-19 Vaccine (4 - Booster for [employer])  11/21/2020   INFLUENZA VACCINE  05/08/2021   OPHTHALMOLOGY EXAM  06/28/2021    Colorectal cancer screening: No longer required.   Mammogram status: Ordered 07/18/2021. Pt provided with contact info and advised to call to schedule appt.   Bone Density status: Completed 10/03/2017. Results reflect: Bone density results: OSTEOPENIA. Repeat every 2-3  years.  Lung Cancer Screening: (Low Dose CT Chest recommended if Age 1-80 years, 30 pack-year currently smoking OR have quit w/in 15years.) does not qualify.   Lung Cancer Screening Referral: no  Additional Screening:  Hepatitis C Screening: does not qualify; Completed no  Vision Screening: Recommended annual ophthalmology exams for early detection of glaucoma and other disorders of the eye. Is the patient up to date with their annual eye exam?  Yes  Who is the provider or what is the name of the office in which the patient attends annual eye exams? [facility] If pt is not established with a provider, would they like to be referred to a provider to establish care? No .   Dental Screening: Recommended annual dental exams for proper oral hygiene  Community Resource Referral / Chronic Care Management: CRR required this visit?  No   CCM required this visit?  No      Plan:     I have personally reviewed and noted the following in the patient's chart:   Medical and social history Use of alcohol, tobacco or illicit drugs  Current medications and supplements including opioid prescriptions.  Functional ability and status Nutritional status Physical activity Advanced directives List of other physicians Hospitalizations, surgeries, and ER visits in previous 12 months Vitals Screenings to include cognitive, depression, and falls Referrals and appointments  In addition, I have reviewed and discussed with patient certain preventive protocols, quality metrics, and best practice recommendations. A written personalized care plan for preventive services as well as general preventive health recommendations were provided to patient.     Sheral Flow, LPN   [id number]   Nurse Notes: Patient is cogitatively intact. There were no vitals filed for this visit. There is no height or weight on file to calculate BMI. Patient stated that she has no issues with gait or  balance; does not use any assistive devices.

## 2021-08-12 ENCOUNTER — Other Ambulatory Visit: Payer: Self-pay | Admitting: Internal Medicine

## 2021-08-22 ENCOUNTER — Other Ambulatory Visit: Payer: Self-pay

## 2021-08-22 ENCOUNTER — Encounter: Payer: Self-pay | Admitting: Obstetrics & Gynecology

## 2021-08-22 ENCOUNTER — Ambulatory Visit (INDEPENDENT_AMBULATORY_CARE_PROVIDER_SITE_OTHER): Payer: Medicare HMO | Admitting: Obstetrics & Gynecology

## 2021-08-22 VITALS — BP 120/62 | HR 82 | Resp 16 | Ht 62.25 in | Wt 148.0 lb

## 2021-08-22 DIAGNOSIS — M8589 Other specified disorders of bone density and structure, multiple sites: Secondary | ICD-10-CM

## 2021-08-22 DIAGNOSIS — R102 Pelvic and perineal pain: Secondary | ICD-10-CM | POA: Diagnosis not present

## 2021-08-22 DIAGNOSIS — Z9189 Other specified personal risk factors, not elsewhere classified: Secondary | ICD-10-CM

## 2021-08-22 DIAGNOSIS — Z780 Asymptomatic menopausal state: Secondary | ICD-10-CM

## 2021-08-22 DIAGNOSIS — Z01419 Encounter for gynecological examination (general) (routine) without abnormal findings: Secondary | ICD-10-CM

## 2021-08-22 LAB — URINALYSIS, COMPLETE W/RFL CULTURE
Bacteria, UA: NONE SEEN /HPF
Bilirubin Urine: NEGATIVE
Glucose, UA: NEGATIVE
Hgb urine dipstick: NEGATIVE
Hyaline Cast: NONE SEEN /LPF
Ketones, ur: NEGATIVE
Leukocyte Esterase: NEGATIVE
Nitrites, Initial: NEGATIVE
Protein, ur: NEGATIVE
RBC / HPF: NONE SEEN /HPF (ref 0–2)
Specific Gravity, Urine: 1.01 (ref 1.001–1.035)
WBC, UA: NONE SEEN /HPF (ref 0–5)
pH: 7 (ref 5.0–8.0)

## 2021-08-22 LAB — NO CULTURE INDICATED

## 2021-08-22 NOTE — Progress Notes (Signed)
Jenny Madden 01-22-1941 [id number]   History:    80 y.o. G3P1A2L1 Widowed x 3 yrs.   RP:  Established patient presenting for annual gyn exam    HPI: Postmenopause, well on no hormone replacement therapy.  No postmenopausal bleeding.  No pelvic pain.  Abstinent.  Last Pap 01/2018 Neg.  Mild suprapubic discomfort, no UTI Sx otherwise.  Bowel movements normal.  Breasts normal.  Screening mammo scheduled for 09/2021.  Body mass index improved to 26.85.  Walking.  Health labs with family physician. Colono 2018.  BD Osteopenia in 09/2017, will schedule at [place] thru her Fam MD.   Past medical history,surgical history, family history and social history were all reviewed and documented in the EPIC chart.  Gynecologic History No LMP recorded. Patient is postmenopausal.  Obstetric History OB History  Gravida Para Term Preterm AB Living  3 1 1   2 1   SAB IAB Ectopic Multiple Live Births          1    # Outcome Date GA Lbr Len/2nd Weight Sex Delivery Anes PTL Lv  3 AB           2 AB           1 Term     F CS-Unspec  N LIV     ROS: A ROS was performed and pertinent positives and negatives are included in the history.  GENERAL: No fevers or chills. HEENT: No change in vision, no earache, sore throat or sinus congestion. NECK: No pain or stiffness. CARDIOVASCULAR: No chest pain or pressure. No palpitations. PULMONARY: No shortness of breath, cough or wheeze. GASTROINTESTINAL: No abdominal pain, nausea, vomiting or diarrhea, melena or bright red blood per rectum. GENITOURINARY: No urinary frequency, urgency, hesitancy or dysuria. MUSCULOSKELETAL: No joint or muscle pain, no back pain, no recent trauma. DERMATOLOGIC: No rash, no itching, no lesions. ENDOCRINE: No polyuria, polydipsia, no heat or cold intolerance. No recent change in weight. HEMATOLOGICAL: No anemia or easy bruising or bleeding. NEUROLOGIC: No headache, seizures, numbness, tingling or weakness. PSYCHIATRIC: No depression, no  loss of interest in normal activity or change in sleep pattern.     Exam:   BP 120/62   Pulse 82   Resp 16   Ht 5' 2.25" (1.581 m)   Wt 148 lb (67.1 kg)   BMI 26.85 kg/m   Body mass index is 26.85 kg/m.  General appearance : Well developed well nourished female. No acute distress HEENT: Eyes: no retinal hemorrhage or exudates,  Neck supple, trachea midline, no carotid bruits, no thyroidmegaly Lungs: Clear to auscultation, no rhonchi or wheezes, or rib retractions  Heart: Regular rate and rhythm, no murmurs or gallops Breast:Examined in sitting and supine position were symmetrical in appearance, no palpable masses or tenderness,  no skin retraction, no nipple inversion, no nipple discharge, no skin discoloration, no axillary or supraclavicular lymphadenopathy Abdomen: no palpable masses or tenderness, no rebound or guarding Extremities: no edema or skin discoloration or tenderness  Pelvic: Vulva: Normal             Vagina: No gross lesions or discharge  Cervix: No gross lesions or discharge  Uterus  AV, normal size, shape and consistency, non-tender and mobile  Adnexa  Without masses or tenderness  Anus: Normal  U/A Negative   Assessment/Plan:  80 y.o. female for annual exam   1. Well female exam with routine gynecological exam Postmenopause, well on no hormone replacement therapy.  No postmenopausal  bleeding.  No pelvic pain.  Abstinent.  Last Pap 01/2018 Neg.  Mild suprapubic discomfort, no UTI Sx otherwise.  Bowel movements normal.  Breasts normal.  Screening mammo scheduled for 09/2021.  Body mass index improved to 26.85.  Walking.  Health labs with family physician. Colono 2018.  BD Osteopenia in 09/2017, will schedule at [facility] thru her Fam MD.  2. At risk for complication after fracture  3. Postmenopause Postmenopause, well on no hormone replacement therapy.  No postmenopausal bleeding.   4. Osteopenia of multiple sites  BD Osteopenia in 09/2017, will schedule at  [facility] thru her Fam MD.  5. Pelvic pain - Urinalysis,Complete w/RFL Culture   Princess Bruins MD, 11:21 AM 08/22/2021

## 2021-09-12 ENCOUNTER — Ambulatory Visit
Admission: RE | Admit: 2021-09-12 | Discharge: 2021-09-12 | Disposition: A | Discharge status: Home / Self Care | Payer: Medicare HMO | Source: Ambulatory Visit | Attending: Internal Medicine | Admitting: Internal Medicine

## 2021-09-12 DIAGNOSIS — Z1231 Encounter for screening mammogram for malignant neoplasm of breast: Secondary | ICD-10-CM | POA: Diagnosis not present

## 2021-09-12 DIAGNOSIS — Z1239 Encounter for other screening for malignant neoplasm of breast: Secondary | ICD-10-CM

## 2021-09-26 ENCOUNTER — Other Ambulatory Visit: Payer: Self-pay

## 2021-09-26 ENCOUNTER — Encounter: Payer: Self-pay | Admitting: Internal Medicine

## 2021-09-26 ENCOUNTER — Ambulatory Visit (INDEPENDENT_AMBULATORY_CARE_PROVIDER_SITE_OTHER): Payer: Medicare HMO | Admitting: Internal Medicine

## 2021-09-26 VITALS — BP 124/80 | HR 94 | Resp 18 | Ht 62.25 in | Wt 150.8 lb

## 2021-09-26 DIAGNOSIS — E118 Type 2 diabetes mellitus with unspecified complications: Secondary | ICD-10-CM

## 2021-09-26 DIAGNOSIS — I10 Essential (primary) hypertension: Secondary | ICD-10-CM | POA: Diagnosis not present

## 2021-09-26 DIAGNOSIS — K219 Gastro-esophageal reflux disease without esophagitis: Secondary | ICD-10-CM | POA: Diagnosis not present

## 2021-09-26 DIAGNOSIS — Z0000 Encounter for general adult medical examination without abnormal findings: Secondary | ICD-10-CM

## 2021-09-26 DIAGNOSIS — E1169 Type 2 diabetes mellitus with other specified complication: Secondary | ICD-10-CM | POA: Diagnosis not present

## 2021-09-26 DIAGNOSIS — M8589 Other specified disorders of bone density and structure, multiple sites: Secondary | ICD-10-CM | POA: Diagnosis not present

## 2021-09-26 DIAGNOSIS — E785 Hyperlipidemia, unspecified: Secondary | ICD-10-CM

## 2021-09-26 LAB — LIPID PANEL
Cholesterol: 171 mg/dL (ref 0–200)
HDL: 76.5 mg/dL (ref >39.00)
LDL Cholesterol: 83 mg/dL (ref 0–99)
NonHDL: 94.55
Total CHOL/HDL Ratio: 2
Triglycerides: 59 mg/dL (ref 0.0–149.0)
VLDL: 11.8 mg/dL (ref 0.0–40.0)

## 2021-09-26 LAB — CBC
HCT: 38 % (ref 36.0–46.0)
Hemoglobin: 12.2 g/dL (ref 12.0–15.0)
MCHC: 32 g/dL (ref 30.0–36.0)
MCV: 82.5 fl (ref 78.0–100.0)
Platelets: 220 10*3/uL (ref 150.0–400.0)
RBC: 4.61 Mil/uL (ref 3.87–5.11)
RDW: 13.6 % (ref 11.5–15.5)
WBC: 5.6 10*3/uL (ref 4.0–10.5)

## 2021-09-26 LAB — COMPREHENSIVE METABOLIC PANEL
ALT: 11 U/L (ref 0–35)
AST: 20 U/L (ref 0–37)
Albumin: 4.1 g/dL (ref 3.5–5.2)
Alkaline Phosphatase: 73 U/L (ref 39–117)
BUN: 15 mg/dL (ref 6–23)
CO2: 26 mEq/L (ref 19–32)
Calcium: 9.7 mg/dL (ref 8.4–10.5)
Chloride: 106 mEq/L (ref 96–112)
Creatinine, Ser: 1.13 mg/dL (ref 0.40–1.20)
GFR: 45.75 mL/min — ABNORMAL LOW (ref >60.00)
Glucose, Bld: 82 mg/dL (ref 70–99)
Potassium: 4.2 mEq/L (ref 3.5–5.1)
Sodium: 139 mEq/L (ref 135–145)
Total Bilirubin: 0.3 mg/dL (ref 0.2–1.2)
Total Protein: 7.6 g/dL (ref 6.0–8.3)

## 2021-09-26 LAB — MICROALBUMIN / CREATININE URINE RATIO
Creatinine,U: 39.4 mg/dL
Microalb Creat Ratio: 3.1 mg/g (ref 0.0–30.0)
Microalb, Ur: 1.2 mg/dL (ref 0.0–1.9)

## 2021-09-26 LAB — HEMOGLOBIN A1C: Hgb A1c MFr Bld: 6 % (ref 4.6–6.5)

## 2021-09-26 NOTE — Assessment & Plan Note (Signed)
Foot exam done. Checking lipid panel and microalbumin to creatinine ratio. Diet controlled currently. On statin, not on ACE-I or ARB. Adjust as needed. Reminded about need for yearly eye exam.

## 2021-09-26 NOTE — Assessment & Plan Note (Signed)
Protonix is still helping. Will continue.

## 2021-09-26 NOTE — Progress Notes (Signed)
° °  Subjective:   Patient ID: Jenny Madden, female    DOB: 02/08/1941, 79 y.o.   MRN: [id number]  HPI The patient is an 80 YO female coming in for physical.   PMH, [place], social history reviewed and updated  Review of Systems  Constitutional: Negative.   HENT: Negative.    Eyes: Negative.   Respiratory:  Negative for cough, chest tightness and shortness of breath.   Cardiovascular:  Negative for chest pain, palpitations and leg swelling.  Gastrointestinal:  Negative for abdominal distention, abdominal pain, constipation, diarrhea, nausea and vomiting.  Musculoskeletal: Negative.   Skin: Negative.   Neurological: Negative.   Psychiatric/Behavioral: Negative.     Objective:  Physical Exam Constitutional:      Appearance: She is well-developed.  HENT:     Head: Normocephalic and atraumatic.  Cardiovascular:     Rate and Rhythm: Normal rate and regular rhythm.  Pulmonary:     Effort: Pulmonary effort is normal. No respiratory distress.     Breath sounds: Normal breath sounds. No wheezing or rales.  Abdominal:     General: Bowel sounds are normal. There is no distension.     Palpations: Abdomen is soft.     Tenderness: There is no abdominal tenderness. There is no rebound.  Musculoskeletal:     Cervical back: Normal range of motion.  Skin:    General: Skin is warm and dry.     Comments: Foot exam done  Neurological:     Mental Status: She is alert and oriented to person, place, and time.     Coordination: Coordination normal.    Vitals:   09/26/21 0952  BP: 124/80  Pulse: 94  Resp: 18  SpO2: 98%  Weight: 150 lb 12.8 oz (68.4 kg)  Height: 5' 2.25" (1.581 m)    This visit occurred during the SARS-CoV-2 public health emergency.  Safety protocols were in place, including screening questions prior to the visit, additional usage of staff PPE, and extensive cleaning of exam room while observing appropriate contact time as indicated for disinfecting solutions.    Assessment & Plan:

## 2021-09-26 NOTE — Assessment & Plan Note (Signed)
BP at goal on amlodipine 10 mg daily. Minimal ankle edema tolerable. Checking CMP and CBC and adjust as needed.

## 2021-09-26 NOTE — Assessment & Plan Note (Signed)
Flu shot up to date. Covid-19 up to date. Pneumonia complete. Shingrix advised to get at pharmacy. Tetanus due 2031. Colonoscopy aged out, due 2023 if she desired counseled about this today. Mammogram aged out, pap smear aged out and dexa due 2023. Counseled about sun safety and mole surveillance. Counseled about the dangers of distracted driving. Given 10 year screening recommendations.

## 2021-09-26 NOTE — Assessment & Plan Note (Signed)
Repeat DEXA due 2023.

## 2021-09-26 NOTE — Patient Instructions (Signed)
I would recommend that you are safe to not do the colonoscopy. If you do want to do this let us know I think that would also be okay.

## 2021-09-26 NOTE — Assessment & Plan Note (Signed)
Checking lipid panel and adjust lipitor 20 mg daily as needed for LDL goal <100.  

## 2021-10-13 ENCOUNTER — Ambulatory Visit: Payer: Medicare HMO | Admitting: Internal Medicine

## 2021-10-23 ENCOUNTER — Ambulatory Visit: Payer: Medicare HMO | Admitting: Physician Assistant

## 2021-10-27 ENCOUNTER — Ambulatory Visit: Payer: Medicare HMO | Admitting: Nurse Practitioner

## 2021-10-27 ENCOUNTER — Encounter: Payer: Self-pay | Admitting: Nurse Practitioner

## 2021-10-27 VITALS — BP 140/60 | HR 93 | Ht 62.25 in | Wt 149.0 lb

## 2021-10-27 DIAGNOSIS — K5900 Constipation, unspecified: Secondary | ICD-10-CM

## 2021-10-27 DIAGNOSIS — K222 Esophageal obstruction: Secondary | ICD-10-CM

## 2021-10-27 DIAGNOSIS — R1310 Dysphagia, unspecified: Secondary | ICD-10-CM | POA: Diagnosis not present

## 2021-10-27 NOTE — Patient Instructions (Signed)
PROCEDURES: You have been scheduled for a EGD. Please follow the written instructions given to you at your visit today. If you use inhalers (even only as needed), please bring them with you on the day of your procedure.  RECOMMENDATIONS:  Fiber and stool softner daily. Milk of Magnesia 2 tablespoons every 3rd night as needed.  It was great seeing you today! Thank you for entrusting me with your care and choosing [facility].  Noralyn Pick, CRNP  The [facility] providers would like to encourage you to use [facility] to communicate with providers for non-urgent requests or questions.  Due to long hold times on the telephone, sending your provider a message by [facility] may be faster and more efficient way to get a response. Please allow 48 business hours for a response.  Please remember that this is for non-urgent requests/questions.  If you are age 65 or older, your body mass index should be between 23-30. Your Body mass index is 27.03 kg/m. If this is out of the aforementioned range listed, please consider follow up with your Primary Care Provider.  If you are age 19 or younger, your body mass index should be between 19-25. Your Body mass index is 27.03 kg/m. If this is out of the aformentioned range listed, please consider follow up with your Primary Care Provider.

## 2021-10-27 NOTE — Progress Notes (Signed)
I agree with the above note, plan 

## 2021-10-27 NOTE — Progress Notes (Signed)
10/27/2021 Jenny Madden [id number] 1941-01-17   Chief Complaint: Difficulty swallowing  History of Present Illness: Jenny Madden. [name] is a 81 year old female with a past medical history of anxiety, hypertension, DM II diet controlled, CKD, left renal mass s/p percutaneous cryoablation 2017 (most likely renal cell carcinoma, due to the relatively small size the neoplasm at the time of his treatment a biopsy could not be performed at the time of cryoablation by Dr. Kathlene Cote), dysphagia, Schatzki's ring and colon polyps.  She is followed by Dr. Ardis Hughs.  She presents her office today for further evaluation regarding recurrent dysphagia.  She complains of food getting stuck to the mid esophagus which occurs several days weekly for the past month.  She drinks a lot of water and the food eventually passes down the esophagus.  She vomited once or twice to expel the stuck food.  She remains on pantoprazole 40 mg twice daily and famotidine 40 mg at bedtime.  She has heartburn if she eats sugary foods.  Her most recent EGD was 12/21/2019 which showed a minor Schatzki's ring which was dilated to 20 mm.  Her dysphagia symptoms significantly improved after having her esophagus dilated.  She has mild lower abdominal discomfort which resolves after she urinates and passes a bowel movement.  She has intermittent constipation for which she takes milk of magnesia once every 2 weeks.  She typically passes a normal brown bowel movement every day or 2.  MiraLAX was ineffective when taken in the past.  She has a history of a heart murmur and hypertrophic obstructive cardiomyopathy followed by cardiologist Dr. Angelena Form. Patient saw Dr. Angelena Form 01/25/2020 for evaluation of murmur and palpitations. An ECHO 02/17/2020 showed normal LVEF 60 to 65% with dynamic LVOT gradient 27 mmHg at rest increased to 75 mmHg with Valsalva consistent with hypertrophic obstructive cardiomyopathy.  Dr. Angelena Form did not recommend any further  work-up.  She denies having any chest pain, palpitations or shortness of breath.  EGD 12/21/2019: - Minor Schatzki's ring, dilated to 38mm. - The examination was otherwise normal.  EGD 01/10/2018 EGD by Dr. Ardis Hughs: - Thin Schatzkis' ring, dilated to 76mm. - Mild gastritis, biopsies showed chronic gastritis.  - The examination was otherwise normal.   Colonoscopy 11/07/2016 by Dr. Ardis Hughs: - Two 5 to 8 mm tubular adenomatous polyps in the descending colon and in the ascending colon, removed with a cold snare. Resected and retrieved. - Diverticulosis in the left colon. - The examination was otherwise normal on direct and retroflexion views. -No further colon polyp surveillance colonoscopies recommended.  Letter sent to patient per Dr. Ardis Hughs as follows: At least one of the polyps removed during your recent procedure was proven to be adenomatous.Usually, routine screening/surveillance colonoscopy would be recommended to be done in 5 years.  However since colon cancer screening tests generally stop between ages 54 and 46, I will leave it to you and your primary care physician to contact my office at that time if it is felt that colon cancer screening is still an important issue for you.   CBC Latest Ref Rng & Units 09/26/2021 12/20/2020 08/23/2020  WBC 4.0 - 10.5 K/uL 5.6 6.3 7.2  Hemoglobin 12.0 - 15.0 g/dL 12.2 12.4 13.3  Hematocrit 36.0 - 46.0 % 38.0 37.7 43.4  Platelets 150.0 - 400.0 K/uL 220.0 237.0 254    CMP Latest Ref Rng & Units 09/26/2021 08/23/2020 03/25/2020  Glucose 70 - 99 mg/dL 82 104(H) -  BUN 6 - 23  mg/dL 15 14 -  Creatinine 0.40 - 1.20 mg/dL 1.13 1.00 1.20(H)  Sodium 135 - 145 mEq/L 139 140 -  Potassium 3.5 - 5.1 mEq/L 4.2 3.7 -  Chloride 96 - 112 mEq/L 106 104 -  CO2 19 - 32 mEq/L 26 26 -  Calcium 8.4 - 10.5 mg/dL 9.7 9.8 -  Total Protein 6.0 - 8.3 g/dL 7.6 8.2(H) -  Total Bilirubin 0.2 - 1.2 mg/dL 0.3 0.5 -  Alkaline Phos 39 - 117 U/L 73 81 -  AST 0 - 37 U/L 20 23 -   ALT 0 - 35 U/L 11 15 -     Current Medications, Allergies, Past Medical History, Past Surgical History, Family History and Social History were reviewed in [employer] record.  Review of Systems:   Constitutional: Negative for fever, sweats, chills or weight loss.  Respiratory: Negative for shortness of breath.   Cardiovascular: + Ankle swelling.  Gastrointestinal: See HPI.  Musculoskeletal: Negative for back pain or muscle aches.  Neurological: Negative for dizziness, headaches or paresthesias.   Physical Exam: BP 140/60    Pulse 93    Ht 5' 2.25" (1.581 m)    Wt 149 lb (67.6 kg)    SpO2 97%    BMI 27.03 kg/m  General: 81 year old female in no acute distress. Head: Normocephalic and atraumatic. Eyes: No scleral icterus. Conjunctiva pink . Ears: Normal auditory acuity. Mouth: Partial upper plate.  No ulcers or lesions.  Lungs: Clear throughout to auscultation. Heart: Regular rate and rhythm, II/VI murmur.  Abdomen: Soft, nontender and nondistended. No masses or hepatomegaly. Normal bowel sounds x 4 quadrants.  Rectal: Deferred. Musculoskeletal: Symmetrical with no gross deformities. Extremities: Left ankle with trace edema.  Neurological: Alert oriented x 4. No focal deficits.  Psychological: Alert and cooperative. Normal mood and affect  Assessment and Recommendations:  35) 81 year old female with a history of GERD and a Schatzki's ring presents with recurrent dysphagia. S/P EGD 12/21/2019 identified a minor Schatzki's ring which was dilated to 20 mm and her dysphagia symptoms improved then recurred 4 weeks ago. -EGD with esophageal dilatation -Continue pantoprazole 40 mg daily and famotidine nightly for now -Avoid GERD food triggers -Patient to contact office if her symptoms worsen prior to her EGD date  2) Constipation -We will softener, fiber as tolerated -Okay to take milk of magnesia 2 tablespoons once or twice weekly as needed  3) Two tubular  adenomatous polyps removed from the colon per colonoscopy 11/07/2016.   -No further colon polyp surveillance colonoscopies recommended due to age  20)  History of a heart murmur and hypertrophic obstructive cardiomyopathy followed by cardiologist Dr. Angelena Form. ECHO 02/17/2020 showed normal LVEF 60 to 65%.  On Amlodipine, ASA, Lipitor.  Further recommendations to be determined after the above evaluation completed

## 2021-11-01 ENCOUNTER — Encounter: Payer: Self-pay | Admitting: Internal Medicine

## 2021-11-06 ENCOUNTER — Other Ambulatory Visit: Payer: Self-pay | Admitting: Internal Medicine

## 2021-11-17 ENCOUNTER — Encounter: Payer: Self-pay | Admitting: Gastroenterology

## 2021-11-17 ENCOUNTER — Ambulatory Visit (AMBULATORY_SURGERY_CENTER): Payer: Medicare HMO | Admitting: Gastroenterology

## 2021-11-17 VITALS — BP 138/69 | HR 79 | Temp 96.2°F | Resp 22 | Ht 62.0 in | Wt 149.0 lb

## 2021-11-17 DIAGNOSIS — K222 Esophageal obstruction: Secondary | ICD-10-CM | POA: Diagnosis not present

## 2021-11-17 DIAGNOSIS — R1310 Dysphagia, unspecified: Secondary | ICD-10-CM

## 2021-11-17 MED ORDER — SODIUM CHLORIDE 0.9 % IV SOLN: 500.0000 mL | Freq: Once | INTRAVENOUS | Status: DC

## 2021-11-17 NOTE — Progress Notes (Signed)
Pt's states no medical or surgical changes since previsit or office visit.  ° °VS DT °

## 2021-11-17 NOTE — Patient Instructions (Signed)
Information on hiatal hernias given to you today.  Resume previous diet and medications.  Eat slowly, take small bites and chew your food well.    YOU HAD AN ENDOSCOPIC PROCEDURE TODAY AT [facility]:   Refer to the procedure report that was given to you for any specific questions about what was found during the examination.  If the procedure report does not answer your questions, please call your gastroenterologist to clarify.  If you requested that your care partner not be given the details of your procedure findings, then the procedure report has been included in a sealed envelope for you to review at your convenience later.  YOU SHOULD EXPECT: Some feelings of bloating in the abdomen. Passage of more gas than usual.  Walking can help get rid of the air that was put into your GI tract during the procedure and reduce the bloating. If you had a lower endoscopy (such as a colonoscopy or flexible sigmoidoscopy) you may notice spotting of blood in your stool or on the toilet paper. If you underwent a bowel prep for your procedure, you may not have a normal bowel movement for a few days.  Please Note:  You might notice some irritation and congestion in your nose or some drainage.  This is from the oxygen used during your procedure.  There is no need for concern and it should clear up in a day or so.  SYMPTOMS TO REPORT IMMEDIATELY:   Following upper endoscopy (EGD)  Vomiting of blood or coffee ground material  New chest pain or pain under the shoulder blades  Painful or persistently difficult swallowing  New shortness of breath  Fever of 100F or higher  Black, tarry-looking stools  For urgent or emergent issues, a gastroenterologist can be reached at any hour by calling [phone]. Do not use MyChart messaging for urgent concerns.    DIET:  We do recommend a small meal at first, but then you may proceed to your regular diet.  Drink plenty of fluids but you should avoid  alcoholic beverages for 24 hours.  ACTIVITY:  You should plan to take it easy for the rest of today and you should NOT DRIVE or use heavy machinery until tomorrow (because of the sedation medicines used during the test).    FOLLOW UP: Our staff will call the number listed on your records 48-72 hours following your procedure to check on you and address any questions or concerns that you may have regarding the information given to you following your procedure. If we do not reach you, we will leave a message.  We will attempt to reach you two times.  During this call, we will ask if you have developed any symptoms of COVID 19. If you develop any symptoms (ie: fever, flu-like symptoms, shortness of breath, cough etc.) before then, please call [phone].  If you test positive for Covid 19 in the 2 weeks post procedure, please call and report this information to [place].    If any biopsies were taken you will be contacted by phone or by letter within the next 1-3 weeks.  Please call us at [phone] if you have not heard about the biopsies in 3 weeks.    SIGNATURES/CONFIDENTIALITY: You and/or your care partner have signed paperwork which will be entered into your electronic medical record.  These signatures attest to the fact that that the information above on your After Visit Summary has been reviewed and is understood.  Full  responsibility of the confidentiality of this discharge information lies with you and/or your care-partner.

## 2021-11-17 NOTE — Progress Notes (Signed)
Called to room to assist during endoscopic procedure.  Patient ID and intended procedure confirmed with present staff. Received instructions for my participation in the procedure from the performing physician.  

## 2021-11-17 NOTE — Progress Notes (Signed)
A and O x3. Report to RN. Tolerated MAC anesthesia well.Teeth unchanged after procedure. 

## 2021-11-17 NOTE — Op Note (Signed)
[facility] Patient Name: Jenny Madden Procedure Date: 11/17/2021 3:05 PM MRN: [id number] Endoscopist: Milus Banister , MD Age: 81 Referring MD:  Date of Birth: 27-Feb-1941 Gender: Female Account #: [id number] Procedure:                Upper GI endoscopy Indications:              Dysphagia Medicines:                Monitored Anesthesia Care Procedure:                Pre-Anesthesia Assessment:                           - Prior to the procedure, a History and Physical                            was performed, and patient medications and                            allergies were reviewed. The patient's tolerance of                            previous anesthesia was also reviewed. The risks                            and benefits of the procedure and the sedation                            options and risks were discussed with the patient.                            All questions were answered, and informed consent                            was obtained. Prior Anticoagulants: The patient has                            taken no previous anticoagulant or antiplatelet                            agents. ASA Grade Assessment: III - A patient with                            severe systemic disease. After reviewing the risks                            and benefits, the patient was deemed in                            satisfactory condition to undergo the procedure.                           After obtaining informed consent, the endoscope was  passed under direct vision. Throughout the                            procedure, the patient's blood pressure, pulse, and                            oxygen saturations were monitored continuously. The                            GIF [id number] #[id number] was introduced through the                            mouth, and advanced to the second part of duodenum.                            The upper GI endoscopy was accomplished  without                            difficulty. The patient tolerated the procedure                            well. Scope In: Scope Out: Findings:                 One benign-appearing, intrinsic stenosis was found                            at the gastroesophageal junction (thin Schatzki's                            ring) above a small hiatal hernia. I dilated the                            narrowing using a 27mm TTS balloon held inflated                            for 90 seconds.                           The exam was otherwise without abnormality. Complications:            No immediate complications. Estimated blood loss:                            None. Estimated Blood Loss:     Estimated blood loss: none. Impression:               - One benign-appearing, intrinsic stenosis was                            found at the gastroesophageal junction (thin                            Schatzki's ring) above a small hiatal hernia. I  dilated the narrowing using a 2mm TTS balloon held                            inflated for 90 seconds.                           - Small hiatal hernia.                           - The examination was otherwise normal. Recommendation:           - Patient has a contact number available for                            emergencies. The signs and symptoms of potential                            delayed complications were discussed with the                            patient. Return to normal activities tomorrow.                            Written discharge instructions were provided to the                            patient.                           - Resume previous diet.                           - Continue present medications.                           - Eat slowly, take small bites and chew your food                            well. Milus Banister, MD 11/17/2021 3:28:08 PM This report has been signed electronically.

## 2021-11-17 NOTE — Progress Notes (Signed)
°  The recent H&P (dated 10/27/2021) was reviewed, the patient was examined and there is no change in the patients condition since that H&P was completed.   Jenny Madden  11/17/2021, 3:03 PM

## 2021-11-21 ENCOUNTER — Telehealth: Payer: Self-pay

## 2021-11-21 NOTE — Telephone Encounter (Signed)
°  Follow up Call-  Call back number 11/17/2021 12/21/2019  Post procedure Call Back phone  # [phone]  Permission to leave phone message Yes Yes  Some recent data might be hidden     Patient questions:  Do you have a fever, pain , or abdominal swelling? No. Pain Score  0 *  Have you tolerated food without any problems? Yes.    Have you been able to return to your normal activities? Yes.    Do you have any questions about your discharge instructions: Diet   No. Medications  No. Follow up visit  No.  Do you have questions or concerns about your Care? No.  Actions: * If pain score is 4 or above: No action needed, pain <4.

## 2021-12-12 DIAGNOSIS — E119 Type 2 diabetes mellitus without complications: Secondary | ICD-10-CM | POA: Diagnosis not present

## 2021-12-12 DIAGNOSIS — Z7982 Long term (current) use of aspirin: Secondary | ICD-10-CM | POA: Diagnosis not present

## 2021-12-12 DIAGNOSIS — R32 Unspecified urinary incontinence: Secondary | ICD-10-CM | POA: Diagnosis not present

## 2021-12-12 DIAGNOSIS — Z85528 Personal history of other malignant neoplasm of kidney: Secondary | ICD-10-CM | POA: Diagnosis not present

## 2021-12-12 DIAGNOSIS — Z809 Family history of malignant neoplasm, unspecified: Secondary | ICD-10-CM | POA: Diagnosis not present

## 2021-12-12 DIAGNOSIS — K5900 Constipation, unspecified: Secondary | ICD-10-CM | POA: Diagnosis not present

## 2021-12-12 DIAGNOSIS — Z87891 Personal history of nicotine dependence: Secondary | ICD-10-CM | POA: Diagnosis not present

## 2021-12-12 DIAGNOSIS — K219 Gastro-esophageal reflux disease without esophagitis: Secondary | ICD-10-CM | POA: Diagnosis not present

## 2021-12-12 DIAGNOSIS — R69 Illness, unspecified: Secondary | ICD-10-CM | POA: Diagnosis not present

## 2021-12-12 DIAGNOSIS — I10 Essential (primary) hypertension: Secondary | ICD-10-CM | POA: Diagnosis not present

## 2021-12-12 DIAGNOSIS — E785 Hyperlipidemia, unspecified: Secondary | ICD-10-CM | POA: Diagnosis not present

## 2021-12-12 DIAGNOSIS — M8580 Other specified disorders of bone density and structure, unspecified site: Secondary | ICD-10-CM | POA: Diagnosis not present

## 2021-12-19 ENCOUNTER — Other Ambulatory Visit: Payer: Self-pay | Admitting: Internal Medicine

## 2021-12-29 ENCOUNTER — Other Ambulatory Visit: Payer: Self-pay | Admitting: Internal Medicine

## 2022-03-08 NOTE — Progress Notes (Unsigned)
No chief complaint on file.  History of Present Illness: 81 yo female with history of renal cancer s/p ablation, CKD, HTN, HLD and DM who is here today for follow up. I saw her as a new consult for evaluation of a cardiac murmur and palpitations in April 2021. Echo May 2021 with LVEF=60-65%. Severe basal septal hypertrophy with dynamic LVOT gradient consistent with HOCM. Normal RV size and function. No significant valve disease. She was asymptomatic so no changes were made.   She is here today for follow up. The patient denies any chest pain, dyspnea, palpitations, lower extremity edema, orthopnea, PND, dizziness, near syncope or syncope.    Primary Care Physician: Hoyt Koch, MD  Past Medical History:  Diagnosis Date   Allergic rhinitis, cause unspecified    Anxiety state, unspecified    none recent   Benign neoplasm of colon    Cancer ([place])    renal mass   Cataract    Chronic kidney disease 01/2016   Tumor left side   Diverticulosis of colon (without mention of hemorrhage)    Dysphagia    Family history of adverse reaction to anesthesia 3 yrs ago   slow to awaken    Family history of malignant neoplasm of gastrointestinal tract    Heart murmur    Hypertension    Nonspecific (abnormal) findings on radiological and other examination of other intrathoracic organs    Nonspecific abnormal electrocardiogram (ECG) (EKG)    Osteoporosis    Other dysphagia    Pure hypercholesterolemia    Type II or unspecified type diabetes mellitus without mention of complication, not stated as uncontrolled    Unspecified essential hypertension    Unspecified tinnitus years ago    Past Surgical History:  Procedure Laterality Date   CATARACT EXTRACTION     CESAREAN SECTION     x 1   COLONOSCOPY     colonscopy and endoscopy  5 yrs ago   FOOT SURGERY     bilateral for hammer toes   IR GENERIC HISTORICAL  12/06/2015   IR RADIOLOGIST EVAL & MGMT 12/06/2015 Aletta Edouard, MD [facility]  INTERV RAD   IR GENERIC HISTORICAL  05/03/2016   IR RADIOLOGIST EVAL & MGMT 05/03/2016 Aletta Edouard, MD [facility] INTERV RAD   IR GENERIC HISTORICAL  02/09/2016   IR RADIOLOGIST EVAL & MGMT 02/09/2016 [facility] INTERV RAD   IR RADIOLOGIST EVAL & MGMT  01/09/2017   IR RADIOLOGIST EVAL & MGMT  01/14/2018   IR RADIOLOGIST EVAL & MGMT  03/31/2019   IR RADIOLOGIST EVAL & MGMT  03/30/2020   RADIOFREQUENCY ABLATION KIDNEY Left 01/2016   Renal mass   UPPER GASTROINTESTINAL ENDOSCOPY      Current Outpatient Medications  Medication Sig Dispense Refill   acetaminophen (TYLENOL) 325 MG tablet Take 2 tablets (650 mg total) by mouth every 6 (six) hours as needed for moderate pain. 30 tablet 0   amLODipine (NORVASC) 10 MG tablet TAKE 1 TABLET DAILY 90 tablet 3   aspirin 81 MG tablet Take 81 mg by mouth every morning.     atorvastatin (LIPITOR) 20 MG tablet TAKE 1 TABLET DAILY 90 tablet 0   blood glucose meter kit and supplies Dispense based on patient and insurance preference. Use up to four times daily as directed. (E11.9). 1 each 0   Blood Glucose Monitoring Suppl (ONE TOUCH ULTRA 2) w/Device KIT USE TO CHECK BLOOD SUGAR   TWO TIMES A DAY 1 kit 0   Cholecalciferol (  VITAMIN D) 2000 UNITS CAPS Take 2,000 Units by mouth daily.     famotidine (PEPCID) 40 MG tablet TAKE 1 TABLET AT BEDTIME 90 tablet 3   fexofenadine (ALLEGRA) 180 MG tablet Take 1 tablet (180 mg total) by mouth daily. Take 1 tablet by mouth once daily as needed for allergies (Patient taking differently: Take 180 mg by mouth daily as needed.) 90 tablet 3   fluticasone (FLONASE) 50 MCG/ACT nasal spray Place 2 sprays into both nostrils daily. (Patient taking differently: Place 2 sprays into both nostrils as needed.) 48 g 3   Lancets (ONETOUCH DELICA PLUS [id number]) MISC CHECK BLOOD SUGAR TWO TIMESA DAY. 200 each 1   ONETOUCH ULTRA test strip CHECK BLOOD SUGAR TWO TIMESA DAY 100 strip 2   pantoprazole (PROTONIX) 40 MG tablet TAKE 1 TABLET TWICE DAILY  BEFORE  MEALS 180 tablet 3   polyethylene glycol powder (GLYCOLAX/MIRALAX) 17 GM/SCOOP powder Take 17 g by mouth 2 (two) times daily as needed. 3350 g 1   No current facility-administered medications for this visit.    No Known Allergies  Social History   Socioeconomic History   Marital status: Widowed    Spouse name: Jonathon Bellows x 46 yrs   Number of children: 1   Years of education: 14   Highest education level: High school graduate  Occupational History   Occupation: Retired  Tobacco Use   Smoking status: Former    Packs/day: 0.50    Years: 30.00    Pack years: 15.00    Types: Cigarettes    Quit date: 10/09/1999    Years since quitting: 22.4   Smokeless tobacco: Never  Vaping Use   Vaping Use: Never used  Substance and Sexual Activity   Alcohol use: Yes    Alcohol/week: 1.0 standard drink    Types: 1 Standard drinks or equivalent per week    Comment: social use   Drug use: No   Sexual activity: Not Currently    Partners: Male    Birth control/protection: Post-menopausal    Comment: 1st intercourse- 21, partners- 3  Other Topics Concern   Not on file  Social History Narrative   Widowed, Chiropodist x 33 yrs   1 child, daughter, 2 grandddaughter   High school education   Live alone; 1 story home   Social Determinants of Health   Financial Resource Strain: Low Risk    Difficulty of Paying Living Expenses: Not hard at all  Food Insecurity: No Food Insecurity   Worried About Charity fundraiser in the Last Year: Never true   Arboriculturist in the Last Year: Never true  Transportation Needs: No Transportation Needs   Lack of Transportation (Medical): No   Lack of Transportation (Non-Medical): No  Physical Activity: Sufficiently Active   Days of Exercise per Week: 5 days   Minutes of Exercise per Session: 30 min  Stress: No Stress Concern Present   Feeling of Stress : Not at all  Social Connections: Moderately Integrated   Frequency of Communication with Friends and Family:  More than three times a week   Frequency of Social Gatherings with Friends and Family: More than three times a week   Attends Religious Services: More than 4 times per year   Active Member of Clubs or Organizations: No   Attends Music therapist: More than 4 times per year   Marital Status: Widowed  Intimate Partner Violence: Not At Risk   Fear of Current or Ex-Partner: No  Emotionally Abused: No   Physically Abused: No   Sexually Abused: No    Family History  Problem Relation Age of Onset   Colon cancer Brother 71   Esophageal cancer Neg Hx    Rectal cancer Neg Hx    Stomach cancer Neg Hx     Review of Systems:  As stated in the HPI and otherwise negative.   There were no vitals taken for this visit.  Physical Examination: General: Well developed, well nourished, NAD  HEENT: OP clear, mucus membranes moist  SKIN: warm, dry. No rashes. Neuro: No focal deficits  Musculoskeletal: Muscle strength 5/5 all ext  Psychiatric: Mood and affect normal  Neck: No JVD, no carotid bruits, no thyromegaly, no lymphadenopathy.  Lungs:Clear bilaterally, no wheezes, rhonci, crackles Cardiovascular: Regular rate and rhythm. No murmurs, gallops or rubs. Abdomen:Soft. Bowel sounds present. Non-tender.  Extremities: No lower extremity edema. Pulses are 2 + in the bilateral DP/PT.  EKG:  EKG *** ordered today. The ekg ordered today demonstrates   Recent Labs: 09/26/2021: ALT 11; BUN 15; Creatinine, Ser 1.13; Hemoglobin 12.2; Platelets 220.0; Potassium 4.2; Sodium 139   Lipid Panel    Component Value Date/Time   CHOL 171 09/26/2021 1020   TRIG 59.0 09/26/2021 1020   HDL 76.50 09/26/2021 1020   CHOLHDL 2 09/26/2021 1020   VLDL 11.8 09/26/2021 1020   LDLCALC 83 09/26/2021 1020     Wt Readings from Last 3 Encounters:  11/17/21 149 lb (67.6 kg)  10/27/21 149 lb (67.6 kg)  09/26/21 150 lb 12.8 oz (68.4 kg)     Assessment and Plan:   1. HOCM:   2. HTN:   3.  Hyperlipidemia:   Current medicines are reviewed at length with the patient today.  The patient does not have concerns regarding medicines.  The following changes have been made:  no change  Labs/ tests ordered today include:   No orders of the defined types were placed in this encounter.    Disposition:   FU with me in one year.    Signed, Lauree Chandler, MD 03/08/2022 11:59 AM    [facility] [place], [place], [place]  [address] Phone: [phone]; Fax: [phone]

## 2022-03-09 ENCOUNTER — Encounter: Payer: Self-pay | Admitting: Cardiovascular Disease

## 2022-03-09 ENCOUNTER — Ambulatory Visit: Payer: Medicare HMO | Admitting: Cardiovascular Disease

## 2022-03-09 VITALS — BP 128/62 | HR 82 | Ht 62.0 in | Wt 145.6 lb

## 2022-03-09 DIAGNOSIS — I421 Obstructive hypertrophic cardiomyopathy: Secondary | ICD-10-CM

## 2022-03-09 DIAGNOSIS — I10 Essential (primary) hypertension: Secondary | ICD-10-CM

## 2022-03-09 NOTE — Patient Instructions (Signed)
Medication Instructions:  No changes *If you need a refill on your cardiac medications before your next appointment, please call your pharmacy*   Lab Work: none If you have labs (blood work) drawn today and your tests are completely normal, you will receive your results only by: MyChart Message (if you have MyChart) OR A paper copy in the mail If you have any lab test that is abnormal or we need to change your treatment, we will call you to review the results.   Testing/Procedures: none   Follow-Up: At [facility], you and your health needs are our priority.  As part of our continuing mission to provide you with exceptional heart care, we have created designated Provider Care Teams.  These Care Teams include your primary Cardiologist (physician) and Advanced Practice Providers (APPs -  Physician Assistants and Nurse Practitioners) who all work together to provide you with the care you need, when you need it.  We recommend signing up for the patient portal called "MyChart".  Sign up information is provided on this After Visit Summary.  MyChart is used to connect with patients for Virtual Visits (Telemedicine).  Patients are able to view lab/test results, encounter notes, upcoming appointments, etc.  Non-urgent messages can be sent to your provider as well.   To learn more about what you can do with MyChart, go to [email or web address].    Your next appointment:   12 month(s)  The format for your next appointment:   In Person  Provider:   Christopher McAlhany, MD     Other Instructions   Important Information About Sugar       

## 2022-03-26 ENCOUNTER — Inpatient Hospital Stay (HOSPITAL_COMMUNITY): Payer: Medicare HMO

## 2022-03-26 ENCOUNTER — Encounter (HOSPITAL_COMMUNITY): Payer: Self-pay

## 2022-03-26 ENCOUNTER — Other Ambulatory Visit: Payer: Self-pay

## 2022-03-26 ENCOUNTER — Inpatient Hospital Stay (HOSPITAL_COMMUNITY)
Admission: EM | Admit: 2022-03-26 | Discharge: 2022-04-02 | DRG: 336 | Disposition: A | Discharge status: Home / Self Care | Payer: Medicare HMO | Attending: Student | Admitting: Student

## 2022-03-26 ENCOUNTER — Emergency Department (HOSPITAL_COMMUNITY): Payer: Medicare HMO

## 2022-03-26 DIAGNOSIS — E663 Overweight: Secondary | ICD-10-CM | POA: Diagnosis not present

## 2022-03-26 DIAGNOSIS — I5032 Chronic diastolic (congestive) heart failure: Secondary | ICD-10-CM

## 2022-03-26 DIAGNOSIS — K5900 Constipation, unspecified: Secondary | ICD-10-CM | POA: Diagnosis present

## 2022-03-26 DIAGNOSIS — K56609 Unspecified intestinal obstruction, unspecified as to partial versus complete obstruction: Secondary | ICD-10-CM | POA: Diagnosis not present

## 2022-03-26 DIAGNOSIS — Z4682 Encounter for fitting and adjustment of non-vascular catheter: Secondary | ICD-10-CM | POA: Diagnosis not present

## 2022-03-26 DIAGNOSIS — K5651 Intestinal adhesions [bands], with partial obstruction: Secondary | ICD-10-CM | POA: Diagnosis not present

## 2022-03-26 DIAGNOSIS — I10 Essential (primary) hypertension: Secondary | ICD-10-CM | POA: Diagnosis not present

## 2022-03-26 DIAGNOSIS — D649 Anemia, unspecified: Secondary | ICD-10-CM | POA: Diagnosis not present

## 2022-03-26 DIAGNOSIS — Z85528 Personal history of other malignant neoplasm of kidney: Secondary | ICD-10-CM | POA: Diagnosis not present

## 2022-03-26 DIAGNOSIS — E1165 Type 2 diabetes mellitus with hyperglycemia: Secondary | ICD-10-CM | POA: Diagnosis not present

## 2022-03-26 DIAGNOSIS — R002 Palpitations: Secondary | ICD-10-CM | POA: Diagnosis not present

## 2022-03-26 DIAGNOSIS — Z7982 Long term (current) use of aspirin: Secondary | ICD-10-CM

## 2022-03-26 DIAGNOSIS — I110 Hypertensive heart disease with heart failure: Secondary | ICD-10-CM | POA: Diagnosis present

## 2022-03-26 DIAGNOSIS — R7303 Prediabetes: Secondary | ICD-10-CM | POA: Diagnosis present

## 2022-03-26 DIAGNOSIS — Z800 Family history of malignant neoplasm of digestive organs: Secondary | ICD-10-CM

## 2022-03-26 DIAGNOSIS — E785 Hyperlipidemia, unspecified: Secondary | ICD-10-CM | POA: Diagnosis present

## 2022-03-26 DIAGNOSIS — K6389 Other specified diseases of intestine: Secondary | ICD-10-CM | POA: Diagnosis not present

## 2022-03-26 DIAGNOSIS — K7689 Other specified diseases of liver: Secondary | ICD-10-CM | POA: Diagnosis not present

## 2022-03-26 DIAGNOSIS — N179 Acute kidney failure, unspecified: Secondary | ICD-10-CM | POA: Diagnosis present

## 2022-03-26 DIAGNOSIS — E559 Vitamin D deficiency, unspecified: Secondary | ICD-10-CM | POA: Diagnosis not present

## 2022-03-26 DIAGNOSIS — E118 Type 2 diabetes mellitus with unspecified complications: Secondary | ICD-10-CM | POA: Diagnosis present

## 2022-03-26 DIAGNOSIS — K2100 Gastro-esophageal reflux disease with esophagitis, without bleeding: Secondary | ICD-10-CM | POA: Diagnosis not present

## 2022-03-26 DIAGNOSIS — F411 Generalized anxiety disorder: Secondary | ICD-10-CM | POA: Diagnosis present

## 2022-03-26 DIAGNOSIS — K565 Intestinal adhesions [bands], unspecified as to partial versus complete obstruction: Secondary | ICD-10-CM | POA: Diagnosis not present

## 2022-03-26 DIAGNOSIS — J309 Allergic rhinitis, unspecified: Secondary | ICD-10-CM | POA: Diagnosis not present

## 2022-03-26 DIAGNOSIS — R112 Nausea with vomiting, unspecified: Secondary | ICD-10-CM | POA: Diagnosis not present

## 2022-03-26 DIAGNOSIS — J029 Acute pharyngitis, unspecified: Secondary | ICD-10-CM

## 2022-03-26 DIAGNOSIS — E7800 Pure hypercholesterolemia, unspecified: Secondary | ICD-10-CM | POA: Diagnosis present

## 2022-03-26 DIAGNOSIS — Z87891 Personal history of nicotine dependence: Secondary | ICD-10-CM | POA: Diagnosis not present

## 2022-03-26 DIAGNOSIS — N390 Urinary tract infection, site not specified: Secondary | ICD-10-CM | POA: Diagnosis not present

## 2022-03-26 DIAGNOSIS — Z79899 Other long term (current) drug therapy: Secondary | ICD-10-CM

## 2022-03-26 DIAGNOSIS — N281 Cyst of kidney, acquired: Secondary | ICD-10-CM | POA: Diagnosis not present

## 2022-03-26 DIAGNOSIS — R1010 Upper abdominal pain, unspecified: Secondary | ICD-10-CM | POA: Diagnosis not present

## 2022-03-26 DIAGNOSIS — K5730 Diverticulosis of large intestine without perforation or abscess without bleeding: Secondary | ICD-10-CM | POA: Diagnosis not present

## 2022-03-26 DIAGNOSIS — R1319 Other dysphagia: Secondary | ICD-10-CM | POA: Diagnosis present

## 2022-03-26 DIAGNOSIS — K5909 Other constipation: Secondary | ICD-10-CM | POA: Diagnosis not present

## 2022-03-26 DIAGNOSIS — E860 Dehydration: Secondary | ICD-10-CM | POA: Diagnosis not present

## 2022-03-26 DIAGNOSIS — Z0389 Encounter for observation for other suspected diseases and conditions ruled out: Secondary | ICD-10-CM | POA: Diagnosis not present

## 2022-03-26 DIAGNOSIS — I700 Atherosclerosis of aorta: Secondary | ICD-10-CM | POA: Diagnosis not present

## 2022-03-26 DIAGNOSIS — E1169 Type 2 diabetes mellitus with other specified complication: Secondary | ICD-10-CM | POA: Diagnosis present

## 2022-03-26 DIAGNOSIS — Z6826 Body mass index (BMI) 26.0-26.9, adult: Secondary | ICD-10-CM

## 2022-03-26 DIAGNOSIS — M810 Age-related osteoporosis without current pathological fracture: Secondary | ICD-10-CM | POA: Diagnosis present

## 2022-03-26 DIAGNOSIS — K219 Gastro-esophageal reflux disease without esophagitis: Secondary | ICD-10-CM | POA: Diagnosis present

## 2022-03-26 DIAGNOSIS — K5669 Other partial intestinal obstruction: Secondary | ICD-10-CM | POA: Diagnosis not present

## 2022-03-26 DIAGNOSIS — E782 Mixed hyperlipidemia: Secondary | ICD-10-CM | POA: Diagnosis present

## 2022-03-26 DIAGNOSIS — Z743 Need for continuous supervision: Secondary | ICD-10-CM | POA: Diagnosis not present

## 2022-03-26 HISTORY — DX: Chronic diastolic (congestive) heart failure: I50.32

## 2022-03-26 LAB — CBC WITH DIFFERENTIAL/PLATELET
Abs Immature Granulocytes: 0.05 10*3/uL (ref 0.00–0.07)
Basophils Absolute: 0 10*3/uL (ref 0.0–0.1)
Basophils Relative: 0 %
Eosinophils Absolute: 0 10*3/uL (ref 0.0–0.5)
Eosinophils Relative: 0 %
HCT: 41.6 % (ref 36.0–46.0)
Hemoglobin: 12.6 g/dL (ref 12.0–15.0)
Immature Granulocytes: 1 %
Lymphocytes Relative: 11 %
Lymphs Abs: 1.2 10*3/uL (ref 0.7–4.0)
MCH: 26.6 pg (ref 26.0–34.0)
MCHC: 30.3 g/dL (ref 30.0–36.0)
MCV: 87.8 fL (ref 80.0–100.0)
Monocytes Absolute: 0.6 10*3/uL (ref 0.1–1.0)
Monocytes Relative: 5 %
Neutro Abs: 8.9 10*3/uL — ABNORMAL HIGH (ref 1.7–7.7)
Neutrophils Relative %: 83 %
Platelets: 231 10*3/uL (ref 150–400)
RBC: 4.74 MIL/uL (ref 3.87–5.11)
RDW: 13.4 % (ref 11.5–15.5)
WBC: 10.8 10*3/uL — ABNORMAL HIGH (ref 4.0–10.5)
nRBC: 0 % (ref 0.0–0.2)

## 2022-03-26 LAB — COMPREHENSIVE METABOLIC PANEL
ALT: 13 U/L (ref 0–44)
AST: 24 U/L (ref 15–41)
Albumin: 4.2 g/dL (ref 3.5–5.0)
Alkaline Phosphatase: 64 U/L (ref 38–126)
Anion gap: 10 (ref 5–15)
BUN: 13 mg/dL (ref 8–23)
CO2: 22 mmol/L (ref 22–32)
Calcium: 9.3 mg/dL (ref 8.9–10.3)
Chloride: 108 mmol/L (ref 98–111)
Creatinine, Ser: 1.01 mg/dL — ABNORMAL HIGH (ref 0.44–1.00)
GFR, Estimated: 56 mL/min — ABNORMAL LOW (ref >60)
Glucose, Bld: 144 mg/dL — ABNORMAL HIGH (ref 70–99)
Potassium: 4.1 mmol/L (ref 3.5–5.1)
Sodium: 140 mmol/L (ref 135–145)
Total Bilirubin: 0.5 mg/dL (ref 0.3–1.2)
Total Protein: 7.6 g/dL (ref 6.5–8.1)

## 2022-03-26 LAB — ECHOCARDIOGRAM COMPLETE
AR max vel: 2.39 cm2
AV Area VTI: 2.92 cm2
AV Area mean vel: 2.45 cm2
AV Mean grad: 12 mmHg
AV Peak grad: 24.4 mmHg
Ao pk vel: 2.47 m/s
Area-P 1/2: 3.19 cm2
Height: 62 in
S' Lateral: 2.1 cm
Weight: 2328.06 oz

## 2022-03-26 LAB — URINALYSIS, ROUTINE W REFLEX MICROSCOPIC
Bilirubin Urine: NEGATIVE
Glucose, UA: NEGATIVE mg/dL
Ketones, ur: NEGATIVE mg/dL
Nitrite: POSITIVE — AB
Protein, ur: NEGATIVE mg/dL
Specific Gravity, Urine: 1.01 (ref 1.005–1.030)
pH: 8 (ref 5.0–8.0)

## 2022-03-26 LAB — BASIC METABOLIC PANEL
Anion gap: 10 (ref 5–15)
BUN: 11 mg/dL (ref 8–23)
CO2: 23 mmol/L (ref 22–32)
Calcium: 9.5 mg/dL (ref 8.9–10.3)
Chloride: 105 mmol/L (ref 98–111)
Creatinine, Ser: 0.88 mg/dL (ref 0.44–1.00)
GFR, Estimated: >60 mL/min (ref >60)
Glucose, Bld: 152 mg/dL — ABNORMAL HIGH (ref 70–99)
Potassium: 3.7 mmol/L (ref 3.5–5.1)
Sodium: 138 mmol/L (ref 135–145)

## 2022-03-26 LAB — CBC
HCT: 43.2 % (ref 36.0–46.0)
Hemoglobin: 13.3 g/dL (ref 12.0–15.0)
MCH: 26.1 pg (ref 26.0–34.0)
MCHC: 30.8 g/dL (ref 30.0–36.0)
MCV: 84.9 fL (ref 80.0–100.0)
Platelets: 241 10*3/uL (ref 150–400)
RBC: 5.09 MIL/uL (ref 3.87–5.11)
RDW: 13.2 % (ref 11.5–15.5)
WBC: 9.8 10*3/uL (ref 4.0–10.5)
nRBC: 0 % (ref 0.0–0.2)

## 2022-03-26 LAB — CBG MONITORING, ED: Glucose-Capillary: 127 mg/dL — ABNORMAL HIGH (ref 70–99)

## 2022-03-26 LAB — LACTIC ACID, PLASMA: Lactic Acid, Venous: 1.4 mmol/L (ref 0.5–1.9)

## 2022-03-26 LAB — MAGNESIUM: Magnesium: 2.5 mg/dL — ABNORMAL HIGH (ref 1.7–2.4)

## 2022-03-26 LAB — LIPASE, BLOOD: Lipase: 46 U/L (ref 11–51)

## 2022-03-26 MED ORDER — ONDANSETRON HCL 4 MG PO TABS: 4.0000 mg | ORAL_TABLET | Freq: Four times a day (QID) | ORAL | Status: DC | PRN

## 2022-03-26 MED ORDER — SODIUM CHLORIDE 0.9 % IV SOLN
1.0000 g | INTRAVENOUS | Status: AC
  Administered 2022-03-27 – 2022-03-28 (×2): 1 g via INTRAVENOUS
  Filled 2022-03-26 (×2): qty 10

## 2022-03-26 MED ORDER — ACETAMINOPHEN 650 MG RE SUPP: 650.0000 mg | Freq: Four times a day (QID) | RECTAL | Status: DC | PRN

## 2022-03-26 MED ORDER — ONDANSETRON HCL 4 MG/2ML IJ SOLN
4.0000 mg | Freq: Four times a day (QID) | INTRAMUSCULAR | Status: DC | PRN
  Administered 2022-03-26: 4 mg via INTRAVENOUS
  Filled 2022-03-26: qty 2

## 2022-03-26 MED ORDER — LACTATED RINGERS IV SOLN: INTRAVENOUS | Status: DC

## 2022-03-26 MED ORDER — ENOXAPARIN SODIUM 40 MG/0.4ML IJ SOSY
40.0000 mg | PREFILLED_SYRINGE | INTRAMUSCULAR | Status: DC
  Administered 2022-03-26 – 2022-04-01 (×6): 40 mg via SUBCUTANEOUS
  Filled 2022-03-26 (×6): qty 0.4

## 2022-03-26 MED ORDER — IOHEXOL 300 MG/ML  SOLN
100.0000 mL | Freq: Once | INTRAMUSCULAR | Status: AC | PRN
  Administered 2022-03-26: 100 mL via INTRAVENOUS

## 2022-03-26 MED ORDER — METOPROLOL TARTRATE 5 MG/5ML IV SOLN
5.0000 mg | Freq: Two times a day (BID) | INTRAVENOUS | Status: DC
  Administered 2022-03-26 – 2022-03-27 (×2): 5 mg via INTRAVENOUS
  Filled 2022-03-26 (×5): qty 5

## 2022-03-26 MED ORDER — SODIUM CHLORIDE 0.9 % IV SOLN
1.0000 g | Freq: Once | INTRAVENOUS | Status: AC
  Administered 2022-03-26: 1 g via INTRAVENOUS
  Filled 2022-03-26: qty 10

## 2022-03-26 MED ORDER — DIATRIZOATE MEGLUMINE & SODIUM 66-10 % PO SOLN
90.0000 mL | Freq: Once | ORAL | Status: AC
Start: 2022-03-26 — End: 2022-03-26
  Administered 2022-03-26: 90 mL via NASOGASTRIC
  Filled 2022-03-26: qty 90

## 2022-03-26 MED ORDER — PANTOPRAZOLE SODIUM 40 MG IV SOLR
40.0000 mg | INTRAVENOUS | Status: DC
Start: 2022-03-26 — End: 2022-04-02
  Administered 2022-03-26 – 2022-04-02 (×8): 40 mg via INTRAVENOUS
  Filled 2022-03-26 (×8): qty 10

## 2022-03-26 MED ORDER — PROCHLORPERAZINE EDISYLATE 10 MG/2ML IJ SOLN
7.5000 mg | Freq: Four times a day (QID) | INTRAMUSCULAR | Status: DC | PRN
  Administered 2022-03-26 – 2022-03-30 (×4): 7.5 mg via INTRAVENOUS
  Filled 2022-03-26 (×4): qty 2

## 2022-03-26 MED ORDER — SODIUM CHLORIDE 0.9 % IV BOLUS
500.0000 mL | Freq: Once | INTRAVENOUS | Status: AC
Start: 2022-03-26 — End: 2022-03-26
  Administered 2022-03-26: 500 mL via INTRAVENOUS

## 2022-03-26 MED ORDER — ACETAMINOPHEN 325 MG PO TABS: 650.0000 mg | ORAL_TABLET | Freq: Four times a day (QID) | ORAL | Status: DC | PRN

## 2022-03-26 MED ORDER — ONDANSETRON HCL 4 MG/2ML IJ SOLN
4.0000 mg | Freq: Once | INTRAMUSCULAR | Status: AC
  Administered 2022-03-26: 4 mg via INTRAVENOUS
  Filled 2022-03-26: qty 2

## 2022-03-26 MED ORDER — POTASSIUM CHLORIDE IN NACL 20-0.9 MEQ/L-% IV SOLN
INTRAVENOUS | Status: DC
  Filled 2022-03-26 (×8): qty 1000

## 2022-03-26 MED ORDER — FENTANYL CITRATE PF 50 MCG/ML IJ SOSY: 25.0000 ug | PREFILLED_SYRINGE | INTRAMUSCULAR | Status: DC | PRN

## 2022-03-26 MED ORDER — FENTANYL CITRATE PF 50 MCG/ML IJ SOSY
25.0000 ug | PREFILLED_SYRINGE | Freq: Once | INTRAMUSCULAR | Status: AC
  Administered 2022-03-26: 25 ug via INTRAVENOUS
  Filled 2022-03-26: qty 1

## 2022-03-26 MED ORDER — LACTATED RINGERS IV BOLUS
500.0000 mL | Freq: Once | INTRAVENOUS | Status: AC
  Administered 2022-03-26: 500 mL via INTRAVENOUS

## 2022-03-26 NOTE — ED Triage Notes (Signed)
BIBA with upper abd pain x12hrs with N/V emesis x6. Milk of Mag taken around 1900. Hx HTN, vertigo, GERD.

## 2022-03-26 NOTE — Consult Note (Signed)
Reason for Consult:ab pain Referring Physician: Dr Rometta Emery is an 81 y.o. female.  HPI: 52 yof with only prior surgery of c section, has lower ab pain for past 24 hours. Not getting better at home. Has some n/v. Had stool yesterday, no flatus.  Right now no ab pain. She has no urinary complaints. Has had rf of kidney lesion before and utd on csc.  She had ct scan that shows sbo and I was asked to see her.   Past Medical History:  Diagnosis Date   Allergic rhinitis, cause unspecified    Anxiety state, unspecified    none recent   Benign neoplasm of colon    Cancer ([place])    renal mass   Cataract    Chronic kidney disease 01/2016   Tumor left side   Diverticulosis of colon (without mention of hemorrhage)    Dysphagia    Family history of adverse reaction to anesthesia 3 yrs ago   slow to awaken    Family history of malignant neoplasm of gastrointestinal tract    Heart murmur    Hypertension    Nonspecific (abnormal) findings on radiological and other examination of other intrathoracic organs    Nonspecific abnormal electrocardiogram (ECG) (EKG)    Osteoporosis    Other dysphagia    Pure hypercholesterolemia    Type II or unspecified type diabetes mellitus without mention of complication, not stated as uncontrolled    Unspecified essential hypertension    Unspecified tinnitus years ago    Past Surgical History:  Procedure Laterality Date   CATARACT EXTRACTION     CESAREAN SECTION     x 1   COLONOSCOPY     colonscopy and endoscopy  5 yrs ago   FOOT SURGERY     bilateral for hammer toes   IR GENERIC HISTORICAL  12/06/2015   IR RADIOLOGIST EVAL & MGMT 12/06/2015 Aletta Edouard, MD [facility] INTERV RAD   IR GENERIC HISTORICAL  05/03/2016   IR RADIOLOGIST EVAL & MGMT 05/03/2016 Aletta Edouard, MD [facility] INTERV RAD   IR GENERIC HISTORICAL  02/09/2016   IR RADIOLOGIST EVAL & MGMT 02/09/2016 [facility] INTERV RAD   IR RADIOLOGIST EVAL & MGMT  01/09/2017   IR RADIOLOGIST EVAL &  MGMT  01/14/2018   IR RADIOLOGIST EVAL & MGMT  03/31/2019   IR RADIOLOGIST EVAL & MGMT  03/30/2020   RADIOFREQUENCY ABLATION KIDNEY Left 01/2016   Renal mass   UPPER GASTROINTESTINAL ENDOSCOPY      Family History  Problem Relation Age of Onset   Colon cancer Brother 2   Esophageal cancer Neg Hx    Rectal cancer Neg Hx    Stomach cancer Neg Hx     Social History:  reports that she quit smoking about 22 years ago. Her smoking use included cigarettes. She has a 15.00 pack-year smoking history. She has never used smokeless tobacco. She reports current alcohol use of about 1.0 standard drink of alcohol per week. She reports that she does not use drugs.  Allergies: No Known Allergies  Medications: I have reviewed the patient's current medications. No current facility-administered medications on file prior to encounter.   Current Outpatient Medications on File Prior to Encounter  Medication Sig Dispense Refill   acetaminophen (TYLENOL) 325 MG tablet Take 2 tablets (650 mg total) by mouth every 6 (six) hours as needed for moderate pain. 30 tablet 0   amLODipine (NORVASC) 10 MG tablet TAKE 1 TABLET DAILY (Patient taking differently:  Take 10 mg by mouth daily.) 90 tablet 3   aspirin 81 MG tablet Take 81 mg by mouth every morning.     atorvastatin (LIPITOR) 20 MG tablet TAKE 1 TABLET DAILY (Patient taking differently: Take 20 mg by mouth daily.) 90 tablet 0   Cholecalciferol (VITAMIN D) 2000 UNITS CAPS Take 2,000 Units by mouth daily.     famotidine (PEPCID) 40 MG tablet TAKE 1 TABLET AT BEDTIME (Patient taking differently: Take 40 mg by mouth at bedtime.) 90 tablet 3   fexofenadine (ALLEGRA) 180 MG tablet Take 1 tablet (180 mg total) by mouth daily. Take 1 tablet by mouth once daily as needed for allergies (Patient taking differently: Take 180 mg by mouth daily as needed.) 90 tablet 3   pantoprazole (PROTONIX) 40 MG tablet TAKE 1 TABLET TWICE DAILY  BEFORE MEALS (Patient taking differently: 40 mg  2 (two) times daily before a meal.) 180 tablet 3   blood glucose meter kit and supplies Dispense based on patient and insurance preference. Use up to four times daily as directed. (E11.9). 1 each 0   Blood Glucose Monitoring Suppl (ONE TOUCH ULTRA 2) w/Device KIT USE TO CHECK BLOOD SUGAR   TWO TIMES A DAY 1 kit 0   fluticasone (FLONASE) 50 MCG/ACT nasal spray Place 2 sprays into both nostrils daily. (Patient not taking: Reported on 03/26/2022) 48 g 3   Lancets (ONETOUCH DELICA PLUS [id number]) MISC CHECK BLOOD SUGAR TWO TIMESA DAY. 200 each 1   ONETOUCH ULTRA test strip CHECK BLOOD SUGAR TWO TIMESA DAY 100 strip 2   polyethylene glycol powder (GLYCOLAX/MIRALAX) 17 GM/SCOOP powder Take 17 g by mouth 2 (two) times daily as needed. (Patient not taking: Reported on 03/26/2022) 3350 g 1    Results for orders placed or performed during the hospital encounter of 03/26/22 (from the past 48 hour(s))  Comprehensive metabolic panel     Status: Abnormal   Collection Time: 03/26/22  1:39 AM  Result Value Ref Range   Sodium 140 135 - 145 mmol/L   Potassium 4.1 3.5 - 5.1 mmol/L   Chloride 108 98 - 111 mmol/L   CO2 22 22 - 32 mmol/L   Glucose, Bld 144 (H) 70 - 99 mg/dL    Comment: Glucose reference range applies only to samples taken after fasting for at least 8 hours.   BUN 13 8 - 23 mg/dL   Creatinine, Ser 1.01 (H) 0.44 - 1.00 mg/dL   Calcium 9.3 8.9 - 10.3 mg/dL   Total Protein 7.6 6.5 - 8.1 g/dL   Albumin 4.2 3.5 - 5.0 g/dL   AST 24 15 - 41 U/L   ALT 13 0 - 44 U/L   Alkaline Phosphatase 64 38 - 126 U/L   Total Bilirubin 0.5 0.3 - 1.2 mg/dL   GFR, Estimated 56 (L) >60 mL/min    Comment: (NOTE) Calculated using the CKD-EPI Creatinine Equation (2021)    Anion gap 10 5 - 15    Comment: Performed at [facility], [place] [address], [place], [place] [address]  Lipase, blood     Status: None   Collection Time: 03/26/22  1:39 AM  Result Value Ref Range   Lipase 46 11 - 51 U/L     Comment: Performed at [facility], [place] [address] [place], [place] [address]  CBC with Differential     Status: Abnormal   Collection Time: 03/26/22  1:39 AM  Result Value Ref Range   WBC 10.8 (  H) 4.0 - 10.5 K/uL   RBC 4.74 3.87 - 5.11 MIL/uL   Hemoglobin 12.6 12.0 - 15.0 g/dL   HCT 41.6 36.0 - 46.0 %   MCV 87.8 80.0 - 100.0 fL   MCH 26.6 26.0 - 34.0 pg   MCHC 30.3 30.0 - 36.0 g/dL   RDW 13.4 11.5 - 15.5 %   Platelets 231 150 - 400 K/uL   nRBC 0.0 0.0 - 0.2 %   Neutrophils Relative % 83 %   Neutro Abs 8.9 (H) 1.7 - 7.7 K/uL   Lymphocytes Relative 11 %   Lymphs Abs 1.2 0.7 - 4.0 K/uL   Monocytes Relative 5 %   Monocytes Absolute 0.6 0.1 - 1.0 K/uL   Eosinophils Relative 0 %   Eosinophils Absolute 0.0 0.0 - 0.5 K/uL   Basophils Relative 0 %   Basophils Absolute 0.0 0.0 - 0.1 K/uL   Immature Granulocytes 1 %   Abs Immature Granulocytes 0.05 0.00 - 0.07 K/uL    Comment: Performed at [facility], [place] [address] [place], [place] [address]  Urinalysis, Routine w reflex microscopic Urine, Clean Catch     Status: Abnormal   Collection Time: 03/26/22  1:56 AM  Result Value Ref Range   Color, Urine YELLOW YELLOW   APPearance CLEAR CLEAR   Specific Gravity, Urine 1.010 1.005 - 1.030   pH 8.0 5.0 - 8.0   Glucose, UA NEGATIVE NEGATIVE mg/dL   Hgb urine dipstick MODERATE (A) NEGATIVE   Bilirubin Urine NEGATIVE NEGATIVE   Ketones, ur NEGATIVE NEGATIVE mg/dL   Protein, ur NEGATIVE NEGATIVE mg/dL   Nitrite POSITIVE (A) NEGATIVE   Leukocytes,Ua MODERATE (A) NEGATIVE   RBC / HPF 0-5 0 - 5 RBC/hpf   WBC, UA 21-50 0 - 5 WBC/hpf   Bacteria, UA RARE (A) NONE SEEN   Squamous Epithelial / LPF 0-5 0 - 5    Comment: Performed at [facility], [place] [address] [place], [place] [address]  Basic metabolic panel     Status: Abnormal   Collection Time: 03/26/22  8:20 AM  Result Value Ref Range   Sodium 138 135 - 145 mmol/L   Potassium 3.7  3.5 - 5.1 mmol/L   Chloride 105 98 - 111 mmol/L   CO2 23 22 - 32 mmol/L   Glucose, Bld 152 (H) 70 - 99 mg/dL    Comment: Glucose reference range applies only to samples taken after fasting for at least 8 hours.   BUN 11 8 - 23 mg/dL   Creatinine, Ser 0.88 0.44 - 1.00 mg/dL   Calcium 9.5 8.9 - 10.3 mg/dL   GFR, Estimated >60 >60 mL/min    Comment: (NOTE) Calculated using the CKD-EPI Creatinine Equation (2021)    Anion gap 10 5 - 15    Comment: Performed at [facility], [place] [address] [place], [place] [address]  Magnesium     Status: Abnormal   Collection Time: 03/26/22  8:20 AM  Result Value Ref Range   Magnesium 2.5 (H) 1.7 - 2.4 mg/dL    Comment: Performed at [facility], [place] [address], [place], [place] [address]  CBC     Status: None   Collection Time: 03/26/22  8:20 AM  Result Value Ref Range   WBC 9.8 4.0 - 10.5 K/uL   RBC 5.09 3.87 - 5.11 MIL/uL   Hemoglobin 13.3 12.0 - 15.0 g/dL   HCT 43.2 36.0 - 46.0 %  MCV 84.9 80.0 - 100.0 fL   MCH 26.1 26.0 - 34.0 pg   MCHC 30.8 30.0 - 36.0 g/dL   RDW 13.2 11.5 - 15.5 %   Platelets 241 150 - 400 K/uL   nRBC 0.0 0.0 - 0.2 %    Comment: Performed at [facility], [place] [address], [place], [place] [address]  Lactic acid, plasma     Status: None   Collection Time: 03/26/22  8:54 AM  Result Value Ref Range   Lactic Acid, Venous 1.4 0.5 - 1.9 mmol/L    Comment: Performed at [facility], [place] [address] [place], [place] [address]    DG Abd Portable 1V-Small Bowel Protocol-Position Verification  Result Date: 03/26/2022 CLINICAL DATA:  Upper abdominal pain. EXAM: PORTABLE ABDOMEN - 1 VIEW COMPARISON:  December 30, 2019. FINDINGS: The bowel gas pattern is normal. Distal tip of nasogastric tube is seen in expected position of distal stomach. Residual contrast is noted in the urinary bladder. No radio-opaque calculi or other significant radiographic abnormality are  seen. IMPRESSION: No abnormal bowel dilatation is noted. Electronically Signed   By: Marijo Conception M.D.   On: 03/26/2022 09:19   CT ABDOMEN PELVIS W CONTRAST  Result Date: 03/26/2022 CLINICAL DATA:  Nausea vomiting with right lower quadrant abdominal pain. EXAM: CT ABDOMEN AND PELVIS WITH CONTRAST TECHNIQUE: Multidetector CT imaging of the abdomen and pelvis was performed using the standard protocol following bolus administration of intravenous contrast. RADIATION DOSE REDUCTION: This exam was performed according to the departmental dose-optimization program which includes automated exposure control, adjustment of the mA and/or kV according to patient size and/or use of iterative reconstruction technique. CONTRAST:  14m OMNIPAQUE IOHEXOL 300 MG/ML  SOLN COMPARISON:  04/19/2021 FINDINGS: Lower chest: Chest basilar atelectasis noted both lower lobes. Hepatobiliary: No suspicious focal abnormality within the liver parenchyma. There is no evidence for gallstones, gallbladder wall thickening, or pericholecystic fluid. No intrahepatic or extrahepatic biliary dilation. Pancreas: No focal mass lesion. No dilatation of the main duct. No intraparenchymal cyst. No peripancreatic edema. Spleen: No splenomegaly. No focal mass lesion. Adrenals/Urinary Tract: No adrenal nodule or mass. Right kidney unremarkable. 5.8 cm simple cyst noted upper pole left kidney, similar to prior. No evidence for hydroureter. The urinary bladder shows no wall thickening although contour is distorted by dilated small bowel in the anterior right pelvis. Stomach/Bowel: Stomach is mildly distended and fluid-filled. Duodenum is normally positioned as is the ligament of Treitz. Proximal small bowel unremarkable. Mid small bowel is dilated and fluid-filled, measuring up to 3 cm diameter. An abrupt transition zone is identified in the anterior right pelvis, immediately adjacent to the distorted urinary bladder (axial image 60/series 2). Small bowel  distal to this location than become slightly more distended before a second transition zone is identified in the anterior right lower quadrant on image 61/2. Small volume interloop mesenteric fluid identified in the region of the right lower quadrant and small bowel distal to this region is completely decompressed (see terminal ileum on image 41/2). The appendix is normal. No gross colonic mass. No colonic wall thickening. Diverticuli are seen scattered along the entire length of the colon without CT findings of diverticulitis. Vascular/Lymphatic: There is moderate atherosclerotic calcification of the abdominal aorta without aneurysm. There is no gastrohepatic or hepatoduodenal ligament lymphadenopathy. No retroperitoneal or mesenteric lymphadenopathy. No pelvic sidewall lymphadenopathy. Reproductive: Unremarkable. Other: None. Musculoskeletal: No worrisome lytic or sclerotic osseous abnormality. Degenerative disc disease noted lumbar spine. IMPRESSION: 1. Findings  consistent with small bowel obstruction with 2 apparent transition points identified in the anterior right pelvis. There is a small volume interloop mesenteric fluid in this region and small bowel distal to this area is completely decompressed. While appearance may be related to adhesions, internal hernia is not excluded. 2. Colonic diverticulosis without diverticulitis. 3. Aortic Atherosclerosis (ICD10-I70.0). Electronically Signed   By: Misty Stanley M.D.   On: 03/26/2022 07:14    Review of Systems  Gastrointestinal:  Positive for abdominal distention, abdominal pain, nausea and vomiting.  All other systems reviewed and are negative.  Blood pressure (!) 143/68, pulse 87, temperature 98.9 F (37.2 C), resp. rate (!) 23, height _0  (1.575 m), weight 66 kg, SpO2 93 %. Physical Exam Vitals reviewed.  Constitutional:      Appearance: She is well-developed.  Eyes:     General: No scleral icterus.    Pupils: Pupils are equal, round, and  reactive to light.  Cardiovascular:     Rate and Rhythm: Normal rate and regular rhythm.  Pulmonary:     Effort: Pulmonary effort is normal.     Breath sounds: Normal breath sounds.  Abdominal:     General: Bowel sounds are normal. There is no distension.     Palpations: Abdomen is soft.     Tenderness: There is abdominal tenderness (very mild rlq with deep palpation).     Hernia: No hernia is present.  Skin:    General: Skin is warm and dry.     Capillary Refill: Capillary refill takes less than 2 seconds.  Neurological:     General: No focal deficit present.     Mental Status: She is alert.  Psychiatric:        Mood and Affect: Mood normal.        Behavior: Behavior normal.     Assessment/Plan: SBO no prior surgery -no real urgent indication for surgery clinically -lactic acid normal, vitals normal, exam without peritonitis -will do sbo protocol, has ng in place, discussed with nursing -discussed indications for surgery with her and daughter  I reviewed ED provider notes, last 24 h vitals and pain scores, last 24 h labs and trends, and last 24 h imaging results.discussed with ER provider, reviewed ct scan independently, reviewed cbc, bmet and lactic acid decision for no surgery now and admission reached  This care required high  level of medical decision making.    Rolm Bookbinder 03/26/2022, 10:17 AM

## 2022-03-26 NOTE — ED Provider Notes (Signed)
Emergency Department Provider Note   I have reviewed the triage vital signs and the nursing notes.   HISTORY  Chief Complaint Abdominal Pain   HPI Jenny Madden is a 81 y.o. female with past history reviewed below presents emergency department for evaluation of epigastric to mid abdominal pain.  Symptoms developed over the past 12 hours.  She has developed associated nausea and vomiting with approximately 6 episodes prior to arrival.  No blood in the emesis.  No diarrhea.  She is not experiencing any chest pain, tightness, pressure.  No shortness of breath. She does note some mild dysuria. No fever or chills.    Past Medical History:  Diagnosis Date   Allergic rhinitis, cause unspecified    Anxiety state, unspecified    none recent   Benign neoplasm of colon    Cancer ([place])    renal mass   Cataract    Chronic diastolic heart failure ([place]) 03/26/2022   Chronic kidney disease 01/2016   Tumor left side   Constipation 10/19/2015   Diverticulosis of colon (without mention of hemorrhage)    Dysphagia    Family history of adverse reaction to anesthesia 3 yrs ago   slow to awaken    Family history of malignant neoplasm of gastrointestinal tract    Heart murmur    Hypertension    Nonspecific (abnormal) findings on radiological and other examination of other intrathoracic organs    Nonspecific abnormal electrocardiogram (ECG) (EKG)    Osteopenia 12/27/2011   Osteoporosis    Other dysphagia    Pure hypercholesterolemia    Type II or unspecified type diabetes mellitus without mention of complication, not stated as uncontrolled    Unspecified essential hypertension    Unspecified tinnitus years ago   Vitamin D deficiency 12/27/2011    Review of Systems  Constitutional: No fever/chills Eyes: No visual changes. ENT: No sore throat. Cardiovascular: Denies chest pain. Respiratory: Denies shortness of breath. Gastrointestinal: Positive abdominal pain. Positive nausea and  vomiting.  No diarrhea.  No constipation. Genitourinary: Negative for dysuria. Musculoskeletal: Negative for back pain. Skin: Negative for rash. Neurological: Negative for headaches, focal weakness or numbness.  ____________________________________________   PHYSICAL EXAM:  VITAL SIGNS: ED Triage Vitals  Enc Vitals Group     BP 03/26/22 0142 (!) 155/68     Pulse Rate 03/26/22 0142 80     Resp 03/26/22 0142 20     Temp 03/26/22 0142 98.9 F (37.2 C)     Temp src --      SpO2 03/26/22 0139 95 %     Weight 03/26/22 0143 145 lb 8.1 oz (66 kg)     Height 03/26/22 0143 '5\' 2"'$  (1.575 m)   Constitutional: Alert and oriented. Well appearing and in no acute distress. Eyes: Conjunctivae are normal. Head: Atraumatic. Nose: No congestion/rhinnorhea. Mouth/Throat: Mucous membranes are moist.  Neck: No stridor.   Cardiovascular: Normal rate, regular rhythm. Good peripheral circulation. Grossly normal heart sounds.   Respiratory: Normal respiratory effort.  No retractions. Lungs CTAB. Gastrointestinal: Soft with mild diffuse tenderness more focal in the right lower quadrant.  No rebound. Mild distention.  Musculoskeletal: No lower extremity tenderness nor edema. No gross deformities of extremities. Neurologic:  Normal speech and language. No gross focal neurologic deficits are appreciated.  Skin:  Skin is warm, dry and intact. No rash noted.   ____________________________________________   LABS (all labs ordered are listed, but only abnormal results are displayed)  Labs Reviewed  COMPREHENSIVE METABOLIC  PANEL - Abnormal; Notable for the following components:      Result Value   Glucose, Bld 144 (*)    Creatinine, Ser 1.01 (*)    GFR, Estimated 56 (*)    All other components within normal limits  CBC WITH DIFFERENTIAL/PLATELET - Abnormal; Notable for the following components:   WBC 10.8 (*)    Neutro Abs 8.9 (*)    All other components within normal limits  URINALYSIS, ROUTINE W  REFLEX MICROSCOPIC - Abnormal; Notable for the following components:   Hgb urine dipstick MODERATE (*)    Nitrite POSITIVE (*)    Leukocytes,Ua MODERATE (*)    Bacteria, UA RARE (*)    All other components within normal limits  BASIC METABOLIC PANEL - Abnormal; Notable for the following components:   Glucose, Bld 152 (*)    All other components within normal limits  MAGNESIUM - Abnormal; Notable for the following components:   Magnesium 2.5 (*)    All other components within normal limits  CBG MONITORING, ED - Abnormal; Notable for the following components:   Glucose-Capillary 127 (*)    All other components within normal limits  URINE CULTURE  LIPASE, BLOOD  CBC  LACTIC ACID, PLASMA  CBC  COMPREHENSIVE METABOLIC PANEL   ____________________________________________  RADIOLOGY  DG Abd Portable 1V-Small Bowel Obstruction Protocol-initial, 8 hr delay  Result Date: 03/26/2022 CLINICAL DATA:  8 hour post contrast small bowel protocol EXAM: PORTABLE ABDOMEN - 1 VIEW COMPARISON:  03/26/2022 radiograph and CT FINDINGS: Moderate enteral contrast within the stomach. Esophageal tube tip overlies the distal stomach. Dilute contrast faintly visible within small bowel. No definitive colon or rectal contrast is present. There is contrast within the urinary bladder. IMPRESSION: Contrast bolus in the proximal stomach. Small amount of dilute contrast within small bowel but no definitive colon or rectal contrast is evident. Electronically Signed   By: Donavan Foil M.D.   On: 03/26/2022 21:19   ECHOCARDIOGRAM COMPLETE  Result Date: 03/26/2022    ECHOCARDIOGRAM REPORT   Patient Name:   Jenny Madden Date of Exam: 03/26/2022 Medical Rec #:  [id number]         Height:       62.0 in Accession #:    [id number]        Weight:       145.5 lb Date of Birth:  06/21/1941         BSA:          1.670 m Patient Age:    19 years          BP:           152/76 mmHg Patient Gender: F                 HR:           90  bpm. Exam Location:  Inpatient Procedure: 2D Echo, Color Doppler and Cardiac Doppler Indications:    Diastolic dysfunction  History:        Patient has prior history of Echocardiogram examinations, most                 recent 02/17/2020. Chronic diastolic heart failure,                 Signs/Symptoms:Murmur; Risk Factors:Hypertension and Diabetes.  Sonographer:    Joette Catching Referring Phys: [phone] [place]  Sonographer Comments: Technically difficult study due to poor echo windows. IMPRESSIONS  1. Left ventricular ejection fraction, by  estimation, is 65 to 70%. The left ventricle has hyperdynamic function. The left ventricle has no regional wall motion abnormalities. Left ventricular diastolic parameters are consistent with Grade I diastolic dysfunction (impaired relaxation). There is LV midcavitary obliteration in systole.  2. Right ventricular systolic function is normal. The right ventricular size is normal.  3. Left atrial size was severely dilated.  4. The mitral valve is normal in structure. No evidence of mitral valve regurgitation. No evidence of mitral stenosis. Moderate mitral annular calcification.  5. The aortic valve is normal in structure. There is mild calcification of the aortic valve. Aortic valve regurgitation is not visualized. Aortic valve sclerosis/calcification is present, without any evidence of aortic stenosis. Aortic valve area, by VTI measures 2.92 cm. Aortic valve mean gradient measures 12.0 mmHg. Aortic valve Vmax measures 2.47 m/s.  6. The inferior vena cava is normal in size with greater than 50% respiratory variability, suggesting right atrial pressure of 3 mmHg. FINDINGS  Left Ventricle: Left ventricular ejection fraction, by estimation, is 65 to 70%. The left ventricle has hyperdynamic function. The left ventricle has no regional wall motion abnormalities. The left ventricular internal cavity size was normal in size. There is no left ventricular hypertrophy. Left  ventricular diastolic parameters are consistent with Grade I diastolic dysfunction (impaired relaxation). Right Ventricle: The right ventricular size is normal. No increase in right ventricular wall thickness. Right ventricular systolic function is normal. Left Atrium: Left atrial size was severely dilated. Right Atrium: Right atrial size was normal in size. Pericardium: There is no evidence of pericardial effusion. Mitral Valve: The mitral valve is normal in structure. Moderate mitral annular calcification. No evidence of mitral valve regurgitation. No evidence of mitral valve stenosis. Tricuspid Valve: The tricuspid valve is normal in structure. Tricuspid valve regurgitation is trivial. No evidence of tricuspid stenosis. Aortic Valve: The aortic valve is normal in structure. There is mild calcification of the aortic valve. Aortic valve regurgitation is not visualized. Aortic valve sclerosis/calcification is present, without any evidence of aortic stenosis. Aortic valve mean gradient measures 12.0 mmHg. Aortic valve peak gradient measures 24.4 mmHg. Aortic valve area, by VTI measures 2.92 cm. Pulmonic Valve: The pulmonic valve was normal in structure. Pulmonic valve regurgitation is not visualized. No evidence of pulmonic stenosis. Aorta: The aortic root is normal in size and structure. Venous: The inferior vena cava is normal in size with greater than 50% respiratory variability, suggesting right atrial pressure of 3 mmHg. IAS/Shunts: No atrial level shunt detected by color flow Doppler.  LEFT VENTRICLE PLAX 2D LVIDd:         3.40 cm   Diastology LVIDs:         2.10 cm   LV e' medial:    6.09 cm/s LV PW:         0.70 cm   LV E/e' medial:  19.0 LV IVS:        1.90 cm   LV e' lateral:   5.22 cm/s LVOT diam:     1.90 cm   LV E/e' lateral: 22.2 LV SV:         92 LV SV Index:   55 LVOT Area:     2.84 cm  RIGHT VENTRICLE             IVC RV Basal diam:  3.00 cm     IVC diam: 1.20 cm RV Mid diam:    2.70 cm RV S prime:      34.40 cm/s TAPSE (M-mode): 3.5  cm LEFT ATRIUM             Index        RIGHT ATRIUM           Index LA diam:        2.90 cm 1.74 cm/m   RA Area:     10.30 cm LA Vol (A2C):   95.1 ml 56.95 ml/m  RA Volume:   18.50 ml  11.08 ml/m LA Vol (A4C):   62.1 ml 37.19 ml/m LA Biplane Vol: 77.2 ml 46.23 ml/m  AORTIC VALVE                     PULMONIC VALVE AV Area (Vmax):    2.39 cm      PV Vmax:          1.94 m/s AV Area (Vmean):   2.45 cm      PV Peak grad:     15.1 mmHg AV Area (VTI):     2.92 cm      PR End Diast Vel: 9.24 msec AV Vmax:           247.00 cm/s AV Vmean:          154.000 cm/s AV VTI:            0.314 m AV Peak Grad:      24.4 mmHg AV Mean Grad:      12.0 mmHg LVOT Vmax:         208.00 cm/s LVOT Vmean:        133.000 cm/s LVOT VTI:          0.323 m LVOT/AV VTI ratio: 1.03  AORTA Ao Root diam: 2.90 cm Ao Asc diam:  3.50 cm MITRAL VALVE                TRICUSPID VALVE MV Area (PHT): 3.19 cm     TR Peak grad:   40.2 mmHg MV Decel Time: 238 msec     TR Vmax:        317.00 cm/s MV E velocity: 116.00 cm/s MV A velocity: 213.00 cm/s  SHUNTS MV E/A ratio:  0.54         Systemic VTI:  0.32 m                             Systemic Diam: 1.90 cm Glori Bickers MD Electronically signed by Glori Bickers MD Signature Date/Time: 03/26/2022/4:19:00 PM    Final    DG Abd Portable 1V-Small Bowel Protocol-Position Verification  Result Date: 03/26/2022 CLINICAL DATA:  Upper abdominal pain. EXAM: PORTABLE ABDOMEN - 1 VIEW COMPARISON:  December 30, 2019. FINDINGS: The bowel gas pattern is normal. Distal tip of nasogastric tube is seen in expected position of distal stomach. Residual contrast is noted in the urinary bladder. No radio-opaque calculi or other significant radiographic abnormality are seen. IMPRESSION: No abnormal bowel dilatation is noted. Electronically Signed   By: Marijo Conception M.D.   On: 03/26/2022 09:19   CT ABDOMEN PELVIS W CONTRAST  Result Date: 03/26/2022 CLINICAL DATA:  Nausea vomiting with  right lower quadrant abdominal pain. EXAM: CT ABDOMEN AND PELVIS WITH CONTRAST TECHNIQUE: Multidetector CT imaging of the abdomen and pelvis was performed using the standard protocol following bolus administration of intravenous contrast. RADIATION DOSE REDUCTION: This exam was performed according to the departmental dose-optimization program which includes automated exposure control, adjustment of the mA and/or  kV according to patient size and/or use of iterative reconstruction technique. CONTRAST:  159m OMNIPAQUE IOHEXOL 300 MG/ML  SOLN COMPARISON:  04/19/2021 FINDINGS: Lower chest: Chest basilar atelectasis noted both lower lobes. Hepatobiliary: No suspicious focal abnormality within the liver parenchyma. There is no evidence for gallstones, gallbladder wall thickening, or pericholecystic fluid. No intrahepatic or extrahepatic biliary dilation. Pancreas: No focal mass lesion. No dilatation of the main duct. No intraparenchymal cyst. No peripancreatic edema. Spleen: No splenomegaly. No focal mass lesion. Adrenals/Urinary Tract: No adrenal nodule or mass. Right kidney unremarkable. 5.8 cm simple cyst noted upper pole left kidney, similar to prior. No evidence for hydroureter. The urinary bladder shows no wall thickening although contour is distorted by dilated small bowel in the anterior right pelvis. Stomach/Bowel: Stomach is mildly distended and fluid-filled. Duodenum is normally positioned as is the ligament of Treitz. Proximal small bowel unremarkable. Mid small bowel is dilated and fluid-filled, measuring up to 3 cm diameter. An abrupt transition zone is identified in the anterior right pelvis, immediately adjacent to the distorted urinary bladder (axial image 60/series 2). Small bowel distal to this location than become slightly more distended before a second transition zone is identified in the anterior right lower quadrant on image 61/2. Small volume interloop mesenteric fluid identified in the region of  the right lower quadrant and small bowel distal to this region is completely decompressed (see terminal ileum on image 41/2). The appendix is normal. No gross colonic mass. No colonic wall thickening. Diverticuli are seen scattered along the entire length of the colon without CT findings of diverticulitis. Vascular/Lymphatic: There is moderate atherosclerotic calcification of the abdominal aorta without aneurysm. There is no gastrohepatic or hepatoduodenal ligament lymphadenopathy. No retroperitoneal or mesenteric lymphadenopathy. No pelvic sidewall lymphadenopathy. Reproductive: Unremarkable. Other: None. Musculoskeletal: No worrisome lytic or sclerotic osseous abnormality. Degenerative disc disease noted lumbar spine. IMPRESSION: 1. Findings consistent with small bowel obstruction with 2 apparent transition points identified in the anterior right pelvis. There is a small volume interloop mesenteric fluid in this region and small bowel distal to this area is completely decompressed. While appearance may be related to adhesions, internal hernia is not excluded. 2. Colonic diverticulosis without diverticulitis. 3. Aortic Atherosclerosis (ICD10-I70.0). Electronically Signed   By: EMisty StanleyM.D.   On: 03/26/2022 07:14    ____________________________________________   PROCEDURES  Procedure(s) performed:   Procedures  None ____________________________________________   INITIAL IMPRESSION / ASSESSMENT AND PLAN / ED COURSE  Pertinent labs & imaging results that were available during my care of the patient were reviewed by me and considered in my medical decision making (see chart for details).   This patient is Presenting for Evaluation of abdominal pain, which does require a range of treatment options, and is a complaint that involves a high risk of morbidity and mortality.  The Differential Diagnoses includes but is not exclusive to acute cholecystitis, intrathoracic causes for epigastric  abdominal pain, gastritis, duodenitis, pancreatitis, small bowel or large bowel obstruction, abdominal aortic aneurysm, hernia, gastritis, etc.   Critical Interventions-    Medications  acetaminophen (TYLENOL) tablet 650 mg (has no administration in time range)    Or  acetaminophen (TYLENOL) suppository 650 mg (has no administration in time range)  pantoprazole (PROTONIX) injection 40 mg (40 mg Intravenous Given 03/26/22 0934)  fentaNYL (SUBLIMAZE) injection 25 mcg (has no administration in time range)  0.9 % NaCl with KCl 20 mEq/ L  infusion ( Intravenous New Bag/Given 03/26/22 2226)  prochlorperazine (COMPAZINE) injection 7.5 mg (  7.5 mg Intravenous Given 03/26/22 2230)  enoxaparin (LOVENOX) injection 40 mg (40 mg Subcutaneous Given 03/26/22 1446)  cefTRIAXone (ROCEPHIN) 1 g in sodium chloride 0.9 % 100 mL IVPB (has no administration in time range)  metoprolol tartrate (LOPRESSOR) injection 5 mg (5 mg Intravenous Given 03/26/22 2214)  sodium chloride 0.9 % bolus 500 mL (0 mLs Intravenous Stopped 03/26/22 0415)  ondansetron (ZOFRAN) injection 4 mg (4 mg Intravenous Given 03/26/22 0209)  fentaNYL (SUBLIMAZE) injection 25 mcg (25 mcg Intravenous Given 03/26/22 0209)  iohexol (OMNIPAQUE) 300 MG/ML solution 100 mL (100 mLs Intravenous Contrast Given 03/26/22 0543)  cefTRIAXone (ROCEPHIN) 1 g in sodium chloride 0.9 % 100 mL IVPB (0 g Intravenous Stopped 03/26/22 0935)  diatrizoate meglumine-sodium (GASTROGRAFIN) 66-10 % solution 90 mL (90 mLs Per NG tube Given 03/26/22 1311)  lactated ringers bolus 500 mL (500 mLs Intravenous New Bag/Given 03/26/22 1129)    Reassessment after intervention: Pain improved.    I did obtain Additional Historical Information from EMS.   I decided to review pertinent External Data, and in summary no recent abdominal imaging in our system.   Clinical Laboratory Tests Ordered, included question some element of UTI on UA.  No acute kidney injury or electrolyte disturbance.  No  leukocytosis or anemia.  Radiologic Tests Ordered, included CT A/P. I independently interpreted the images and agree with radiology interpretation.   Cardiac Monitor Tracing which shows NSR.   Social Determinants of Health Risk patient is a former smoker.   Consult complete with Hospitalist - plan for admit. No active vomiting in the ED.   Medical Decision Making: Summary:  Patient arrives emergency department with abdominal pain along with nausea/vomiting.  She does have some tenderness mainly in the right lower quadrant of the abdomen.  May be cystitis given question of UTI on UA.  Plan for pain management, CT abdomen pelvis, reassess.  Reevaluation with update and discussion with patient.  Discussed CT findings and recommended admit for bowel rest and medical management of bowel obstruction.  She reports feeling overall better and is not having vomiting. Hold on NG tube.   Disposition: admit  ____________________________________________  FINAL CLINICAL IMPRESSION(S) / ED DIAGNOSES  Final diagnoses:  SBO (small bowel obstruction) ([place])    Note:  This document was prepared using Dragon voice recognition software and may include unintentional dictation errors.  Nanda Quinton, MD, [facility]    Monterrius Cardosa, [facility], MD 03/26/22 [phone]

## 2022-03-26 NOTE — ED Notes (Signed)
Called lab to check on blood work.  

## 2022-03-26 NOTE — Progress Notes (Signed)
  Echocardiogram 2D Echocardiogram has been performed.  Joette Catching 03/26/2022, 3:44 PM

## 2022-03-26 NOTE — H&P (Signed)
History and Physical    Patient: Jenny Madden HWE:[id number] DOB: Feb 08, 1941 DOA: 03/26/2022 DOS: the patient was seen and examined on 03/26/2022 PCP: Hoyt Koch, MD  Patient coming from: Home  Chief Complaint:  Chief Complaint  Patient presents with   Abdominal Pain   HPI: Jenny Madden is a 81 y.o. female with medical history significant of allergy rhinitis, anxiety, benign neoplasm of colon, left renal cancer, cataracts, chronic diastolic heart failure, constipation, diverticulosis, dysphagia, heart murmur, hypertension, no new specific abnormal EKG, osteoporosis, hyperlipidemia, type 2 diabetes, hypertension, unspecified tinnitus, vitamin D deficiency who is coming to the emergency department due to abdominal pain that started around midnight associated with at least 5 episodes of emesis according to her daughter.  The pain is seen in her epigastric area and radiates to the right lower quadrant.  She gets frequently constipated.  No appetite changes, no weight loss, melena or hematochezia.  She denied fever, chills, rhinorrhea, sore throat, wheezing or hemoptysis.  No chest pain, palpitations, diaphoresis, PND, orthopnea or pitting edema of the lower extremities.  No flank pain, frequency or hematuria, but has been having mild dysuria for the past 2 weeks.  No polyuria, polydipsia, polyphagia or blurred vision.   ED course: Initial vital signs were temperature 98.9 F, pulse 80, respiration 20, BP 155/68 mmHg O2 sat 96% on room air.  The patient received fentanyl 25 mcg IVP, NS 500 mL bolus, ondansetron 4 mg IVP and ceftriaxone 1 g IVPB.  I changed antiemetic to prochlorperazine 7.5 mg every 6 hours as needed as Zofran was not controlling her nausea.  Lab work: Her urinalysis showed moderate hemoglobinuria, positive nitrites, moderate leukocyte esterase, 21-50 WBC with rare bacteria on microscopic examination.  CBC with a white count of 10.8, hemoglobin 12.6 g/dL platelets  231.  Lipase was normal.  CMP showed a glucose of 144 and creatinine of 1.01 mg/dL.  The rest of the CMP measurements were normal.  Repeat CBC normal.  Lactic acid normal.  Magnesium is 2.5 mg/dL.  Follow-up chemistry showing normal, but lower potassium level and glucose of 152 mg/dL.  Imaging: CT abdomen/pelvis with contrast showed findings consistent with small bowel obstruction with 2 apparent transition point identified in the anterior right pelvis.  There is a small volume interloop mesenteric fluid in these region and small bowel distal to this area is completely decompressed.  While appearance may not be related to adhesions, internal hernia is not excluded.  There is colonic diverticulosis without diverticulitis.  Aortic atherosclerosis.  Please see images and full regular report for further details.  There is bibasilar atelectasis.   Review of Systems: As mentioned in the history of present illness. All other systems reviewed and are negative. Past Medical History:  Diagnosis Date   Allergic rhinitis, cause unspecified    Anxiety state, unspecified    none recent   Benign neoplasm of colon    Cancer ([place])    renal mass   Cataract    Chronic diastolic heart failure ([place]) 03/26/2022   Chronic kidney disease 01/2016   Tumor left side   Constipation 10/19/2015   Diverticulosis of colon (without mention of hemorrhage)    Dysphagia    Family history of adverse reaction to anesthesia 3 yrs ago   slow to awaken    Family history of malignant neoplasm of gastrointestinal tract    Heart murmur    Hypertension    Nonspecific (abnormal) findings on radiological and other examination of other intrathoracic  organs    Nonspecific abnormal electrocardiogram (ECG) (EKG)    Osteopenia 12/27/2011   Osteoporosis    Other dysphagia    Pure hypercholesterolemia    Type II or unspecified type diabetes mellitus without mention of complication, not stated as uncontrolled    Unspecified essential  hypertension    Unspecified tinnitus years ago   Vitamin D deficiency 12/27/2011   Past Surgical History:  Procedure Laterality Date   CATARACT EXTRACTION     CESAREAN SECTION     x 1   COLONOSCOPY     colonscopy and endoscopy  5 yrs ago   FOOT SURGERY     bilateral for hammer toes   IR GENERIC HISTORICAL  12/06/2015   IR RADIOLOGIST EVAL & MGMT 12/06/2015 Aletta Edouard, MD [facility] INTERV RAD   IR GENERIC HISTORICAL  05/03/2016   IR RADIOLOGIST EVAL & MGMT 05/03/2016 Aletta Edouard, MD [facility] INTERV RAD   IR GENERIC HISTORICAL  02/09/2016   IR RADIOLOGIST EVAL & MGMT 02/09/2016 [facility] INTERV RAD   IR RADIOLOGIST EVAL & MGMT  01/09/2017   IR RADIOLOGIST EVAL & MGMT  01/14/2018   IR RADIOLOGIST EVAL & MGMT  03/31/2019   IR RADIOLOGIST EVAL & MGMT  03/30/2020   RADIOFREQUENCY ABLATION KIDNEY Left 01/2016   Renal mass   UPPER GASTROINTESTINAL ENDOSCOPY     Social History:  reports that she quit smoking about 22 years ago. Her smoking use included cigarettes. She has a 15.00 pack-year smoking history. She has never used smokeless tobacco. She reports current alcohol use of about 1.0 standard drink of alcohol per week. She reports that she does not use drugs.  No Known Allergies  Family History  Problem Relation Age of Onset   Colon cancer Brother 25   Esophageal cancer Neg Hx    Rectal cancer Neg Hx    Stomach cancer Neg Hx     Prior to Admission medications   Medication Sig Start Date End Date Taking? Authorizing Provider  acetaminophen (TYLENOL) 325 MG tablet Take 2 tablets (650 mg total) by mouth every 6 (six) hours as needed for moderate pain. 12/24/18  Yes Fawze, Mina A, PA-C  amLODipine (NORVASC) 10 MG tablet TAKE 1 TABLET DAILY Patient taking differently: Take 10 mg by mouth daily. 01/01/22  Yes Hoyt Koch, MD  aspirin 81 MG tablet Take 81 mg by mouth every morning.   Yes [provider]  atorvastatin (LIPITOR) 20 MG tablet TAKE 1 TABLET DAILY Patient taking  differently: Take 20 mg by mouth daily. 01/01/22  Yes Hoyt Koch, MD  Cholecalciferol (VITAMIN D) 2000 UNITS CAPS Take 2,000 Units by mouth daily.   Yes [provider]  famotidine (PEPCID) 40 MG tablet TAKE 1 TABLET AT BEDTIME Patient taking differently: Take 40 mg by mouth at bedtime. 01/01/22  Yes Hoyt Koch, MD  fexofenadine (ALLEGRA) 180 MG tablet Take 1 tablet (180 mg total) by mouth daily. Take 1 tablet by mouth once daily as needed for allergies Patient taking differently: Take 180 mg by mouth daily as needed. 09/20/20  Yes Hoyt Koch, MD  pantoprazole (PROTONIX) 40 MG tablet TAKE 1 TABLET TWICE DAILY  BEFORE MEALS Patient taking differently: 40 mg 2 (two) times daily before a meal. 01/01/22  Yes Hoyt Koch, MD  blood glucose meter kit and supplies Dispense based on patient and insurance preference. Use up to four times daily as directed. (E11.9). 05/16/18   Hoyt Koch, MD  Blood Glucose  Monitoring Suppl (ONE TOUCH ULTRA 2) w/Device KIT USE TO CHECK BLOOD SUGAR   TWO TIMES A DAY 04/05/21   Hoyt Koch, MD  fluticasone [facility]) 50 MCG/ACT nasal spray Place 2 sprays into both nostrils daily. Patient not taking: Reported on 03/26/2022 09/16/19   Hoyt Koch, MD  Lancets (ONETOUCH DELICA PLUS [id number]) [place]. 09/26/20   Hoyt Koch, MD  [facility] ULTRA test strip CHECK BLOOD SUGAR TWO TIMESA DAY 12/21/21   Hoyt Koch, MD  polyethylene glycol powder (GLYCOLAX/MIRALAX) 17 GM/SCOOP powder Take 17 g by mouth 2 (two) times daily as needed. Patient not taking: Reported on 03/26/2022 09/20/20   Hoyt Koch, MD    Physical Exam: Vitals:   03/26/22 1000 03/26/22 1015 03/26/22 1030 03/26/22 1045  BP: (!) 145/73 (!) 151/75 131/79 (!) 170/76  Pulse: 88 82 84 81  Resp: 17 18 (!) 22 16  Temp:      SpO2: 99% 98% 98% 100%  Weight:      Height:       Physical  Exam Vitals and nursing note reviewed.  Constitutional:      General: She is awake.     Appearance: She is well-developed.     Comments: Nasogastric tube in place  HENT:     Head: Normocephalic.     Mouth/Throat:     Mouth: Mucous membranes are dry.  Eyes:     General: No scleral icterus.    Pupils: Pupils are equal, round, and reactive to light.  Neck:     Vascular: No JVD.  Cardiovascular:     Rate and Rhythm: Normal rate and regular rhythm.  Pulmonary:     Effort: Pulmonary effort is normal.     Breath sounds: Normal breath sounds. No wheezing, rhonchi or rales.  Abdominal:     General: Bowel sounds are normal. There is no distension.     Palpations: Abdomen is soft.     Tenderness: There is abdominal tenderness in the right lower quadrant and epigastric area. There is no right CVA tenderness, left CVA tenderness, guarding or rebound.  Musculoskeletal:     Cervical back: Neck supple.     Right lower leg: No edema.     Left lower leg: No edema.  Skin:    General: Skin is warm and dry.  Neurological:     General: No focal deficit present.     Mental Status: She is alert and oriented to person, place, and time.  Psychiatric:        Mood and Affect: Mood normal.        Behavior: Behavior normal. Behavior is cooperative.   Data Reviewed:  Results are pending, will review when available.  Assessment and Plan: Principal Problem:   SBO (small bowel obstruction) (HCC) Observation/MedSurg. Keep NPO. Continue IV fluids. Analgesics as needed. Antiemetics as needed. Pantoprazole 40 mg IVP every 24 hours. Keep electrolytes optimized. Follow-up CBC and CMP in AM. Follow-up imaging in the morning. General surgery input appreciated.  Active Problems:   Acute UTI (urinary tract infection) Continue ceftriaxone 1 g IVPB daily. Follow-up urine culture and sensitivity.    Diabetes mellitus type 2 with complications (HCC) Currently NPO. CBG every 6 hours.    Aortic  atherosclerosis ([place])   Hyperlipidemia associated with type 2 diabetes mellitus ([place]) Resume atorvastatin once cleared for oral intake.    Essential hypertension Holding oral antihypertensives. Metoprolol injection 5 mg twice a day. Monitor blood  pressure and heart rate.    GERD Daily pantoprazole 40 mg IVP.    Constipation Consider enema if no quick resolution.    Palpitations Keep electrolytes optimized.    Chronic diastolic heart failure (HCC) No signs of decompensation at this time.    Advance Care Planning:   Code Status: Full Code   Consults: Rolm Bookbinder, MD (General surgery).  Family Communication: Her daughter was at bedside.  Severity of Illness: The appropriate patient status for this patient is INPATIENT. Inpatient status is judged to be reasonable and necessary in order to provide the required intensity of service to ensure the patient's safety. The patient's presenting symptoms, physical exam findings, and initial radiographic and laboratory data in the context of their chronic comorbidities is felt to place them at high risk for further clinical deterioration. Furthermore, it is not anticipated that the patient will be medically stable for discharge from the hospital within 2 midnights of admission.   * I certify that at the point of admission it is my clinical judgment that the patient will require inpatient hospital care spanning beyond 2 midnights from the point of admission due to high intensity of service, high risk for further deterioration and high frequency of surveillance required.*  Author: Reubin Milan, MD 03/26/2022 1:12 PM  For on call review [email or web address].   This document was prepared using Dragon voice recognition software and may contain some unintended transcription errors.

## 2022-03-27 ENCOUNTER — Encounter (HOSPITAL_COMMUNITY)
Admission: EM | Disposition: A | Discharge status: Home / Self Care | Payer: Self-pay | Source: Home / Self Care | Attending: Student

## 2022-03-27 ENCOUNTER — Encounter (HOSPITAL_COMMUNITY): Payer: Self-pay

## 2022-03-27 ENCOUNTER — Inpatient Hospital Stay (HOSPITAL_COMMUNITY): Payer: Medicare HMO

## 2022-03-27 ENCOUNTER — Inpatient Hospital Stay (HOSPITAL_COMMUNITY): Payer: Medicare HMO | Admitting: Anesthesiology

## 2022-03-27 ENCOUNTER — Ambulatory Visit: Payer: Medicare HMO | Admitting: Internal Medicine

## 2022-03-27 ENCOUNTER — Other Ambulatory Visit: Payer: Self-pay

## 2022-03-27 DIAGNOSIS — K56609 Unspecified intestinal obstruction, unspecified as to partial versus complete obstruction: Secondary | ICD-10-CM | POA: Diagnosis not present

## 2022-03-27 DIAGNOSIS — N390 Urinary tract infection, site not specified: Secondary | ICD-10-CM

## 2022-03-27 DIAGNOSIS — I10 Essential (primary) hypertension: Secondary | ICD-10-CM

## 2022-03-27 DIAGNOSIS — E1169 Type 2 diabetes mellitus with other specified complication: Secondary | ICD-10-CM

## 2022-03-27 DIAGNOSIS — E118 Type 2 diabetes mellitus with unspecified complications: Secondary | ICD-10-CM | POA: Diagnosis not present

## 2022-03-27 DIAGNOSIS — K2100 Gastro-esophageal reflux disease with esophagitis, without bleeding: Secondary | ICD-10-CM

## 2022-03-27 DIAGNOSIS — K5909 Other constipation: Secondary | ICD-10-CM | POA: Diagnosis not present

## 2022-03-27 DIAGNOSIS — R002 Palpitations: Secondary | ICD-10-CM

## 2022-03-27 DIAGNOSIS — E785 Hyperlipidemia, unspecified: Secondary | ICD-10-CM

## 2022-03-27 HISTORY — PX: LAPAROTOMY: SHX154

## 2022-03-27 LAB — COMPREHENSIVE METABOLIC PANEL
ALT: 12 U/L (ref 0–44)
AST: 21 U/L (ref 15–41)
Albumin: 3.6 g/dL (ref 3.5–5.0)
Alkaline Phosphatase: 57 U/L (ref 38–126)
Anion gap: 9 (ref 5–15)
BUN: 14 mg/dL (ref 8–23)
CO2: 23 mmol/L (ref 22–32)
Calcium: 9.1 mg/dL (ref 8.9–10.3)
Chloride: 110 mmol/L (ref 98–111)
Creatinine, Ser: 0.88 mg/dL (ref 0.44–1.00)
GFR, Estimated: >60 mL/min (ref >60)
Glucose, Bld: 121 mg/dL — ABNORMAL HIGH (ref 70–99)
Potassium: 4.1 mmol/L (ref 3.5–5.1)
Sodium: 142 mmol/L (ref 135–145)
Total Bilirubin: 0.8 mg/dL (ref 0.3–1.2)
Total Protein: 7 g/dL (ref 6.5–8.1)

## 2022-03-27 LAB — CBC
HCT: 42.4 % (ref 36.0–46.0)
Hemoglobin: 13 g/dL (ref 12.0–15.0)
MCH: 26.3 pg (ref 26.0–34.0)
MCHC: 30.7 g/dL (ref 30.0–36.0)
MCV: 85.8 fL (ref 80.0–100.0)
Platelets: 248 10*3/uL (ref 150–400)
RBC: 4.94 MIL/uL (ref 3.87–5.11)
RDW: 13.4 % (ref 11.5–15.5)
WBC: 7.4 10*3/uL (ref 4.0–10.5)
nRBC: 0 % (ref 0.0–0.2)

## 2022-03-27 LAB — GLUCOSE, CAPILLARY
Glucose-Capillary: 113 mg/dL — ABNORMAL HIGH (ref 70–99)
Glucose-Capillary: 119 mg/dL — ABNORMAL HIGH (ref 70–99)
Glucose-Capillary: 123 mg/dL — ABNORMAL HIGH (ref 70–99)
Glucose-Capillary: 131 mg/dL — ABNORMAL HIGH (ref 70–99)

## 2022-03-27 LAB — URINE CULTURE: Culture: <10000 — AB

## 2022-03-27 LAB — TYPE AND SCREEN
ABO/RH(D): B POS
Antibody Screen: NEGATIVE

## 2022-03-27 SURGERY — LAPAROTOMY, EXPLORATORY
Anesthesia: General

## 2022-03-27 MED ORDER — ACETAMINOPHEN 10 MG/ML IV SOLN
1000.0000 mg | Freq: Four times a day (QID) | INTRAVENOUS | Status: AC
  Administered 2022-03-27 – 2022-03-28 (×2): 1000 mg via INTRAVENOUS
  Filled 2022-03-27 (×4): qty 100

## 2022-03-27 MED ORDER — ACETAMINOPHEN 10 MG/ML IV SOLN
INTRAVENOUS | Status: AC
  Administered 2022-03-28: 1000 mg via INTRAVENOUS
  Filled 2022-03-27: qty 100

## 2022-03-27 MED ORDER — ROCURONIUM BROMIDE 10 MG/ML (PF) SYRINGE
PREFILLED_SYRINGE | INTRAVENOUS | Status: DC | PRN
  Administered 2022-03-27: 50 mg via INTRAVENOUS

## 2022-03-27 MED ORDER — PHENYLEPHRINE HCL-NACL 20-0.9 MG/250ML-% IV SOLN
INTRAVENOUS | Status: DC | PRN
  Administered 2022-03-27: 100 ug/min via INTRAVENOUS

## 2022-03-27 MED ORDER — PHENYLEPHRINE 80 MCG/ML (10ML) SYRINGE FOR IV PUSH (FOR BLOOD PRESSURE SUPPORT)
PREFILLED_SYRINGE | INTRAVENOUS | Status: DC | PRN
  Administered 2022-03-27: 160 ug via INTRAVENOUS
  Administered 2022-03-27: 240 ug via INTRAVENOUS
  Administered 2022-03-27 (×2): 160 ug via INTRAVENOUS
  Administered 2022-03-27: 80 ug via INTRAVENOUS

## 2022-03-27 MED ORDER — CEFAZOLIN SODIUM-DEXTROSE 2-3 GM-%(50ML) IV SOLR
INTRAVENOUS | Status: DC | PRN
  Administered 2022-03-27: 2 g via INTRAVENOUS

## 2022-03-27 MED ORDER — METHOCARBAMOL 1000 MG/10ML IJ SOLN
500.0000 mg | Freq: Three times a day (TID) | INTRAMUSCULAR | Status: DC | PRN
Start: 2022-03-27 — End: 2022-04-02

## 2022-03-27 MED ORDER — PROPOFOL 10 MG/ML IV BOLUS
INTRAVENOUS | Status: AC
  Filled 2022-03-27: qty 20

## 2022-03-27 MED ORDER — LIDOCAINE HCL (CARDIAC) PF 100 MG/5ML IV SOSY
PREFILLED_SYRINGE | INTRAVENOUS | Status: DC | PRN
  Administered 2022-03-27: 60 mg via INTRAVENOUS

## 2022-03-27 MED ORDER — PHENYLEPHRINE HCL (PRESSORS) 10 MG/ML IV SOLN
INTRAVENOUS | Status: AC
  Filled 2022-03-27: qty 1

## 2022-03-27 MED ORDER — CHLORHEXIDINE GLUCONATE CLOTH 2 % EX PADS
6.0000 | MEDICATED_PAD | CUTANEOUS | Status: AC
Start: 2022-03-27 — End: 2022-03-27
  Administered 2022-03-27: 6 via TOPICAL

## 2022-03-27 MED ORDER — LACTATED RINGERS IV SOLN: INTRAVENOUS | Status: DC

## 2022-03-27 MED ORDER — ALBUMIN HUMAN 5 % IV SOLN
INTRAVENOUS | Status: AC
  Filled 2022-03-27: qty 250

## 2022-03-27 MED ORDER — CHLORHEXIDINE GLUCONATE 0.12 % MT SOLN
15.0000 mL | Freq: Once | OROMUCOSAL | Status: AC
Start: 2022-03-27 — End: 2022-03-27
  Administered 2022-03-27: 15 mL via OROMUCOSAL

## 2022-03-27 MED ORDER — ONDANSETRON HCL 4 MG/2ML IJ SOLN
INTRAMUSCULAR | Status: DC | PRN
  Administered 2022-03-27: 4 mg via INTRAVENOUS

## 2022-03-27 MED ORDER — HYDROMORPHONE HCL 1 MG/ML IJ SOLN
INTRAMUSCULAR | Status: AC
  Filled 2022-03-27: qty 1

## 2022-03-27 MED ORDER — HYDROMORPHONE HCL 1 MG/ML IJ SOLN
0.5000 mg | INTRAMUSCULAR | Status: DC | PRN
  Administered 2022-03-27 – 2022-03-29 (×5): 1 mg via INTRAVENOUS
  Filled 2022-03-27 (×6): qty 1

## 2022-03-27 MED ORDER — SUCCINYLCHOLINE CHLORIDE 200 MG/10ML IV SOSY
PREFILLED_SYRINGE | INTRAVENOUS | Status: DC | PRN
  Administered 2022-03-27: 120 mg via INTRAVENOUS

## 2022-03-27 MED ORDER — SUGAMMADEX SODIUM 200 MG/2ML IV SOLN
INTRAVENOUS | Status: DC | PRN
  Administered 2022-03-27: 200 mg via INTRAVENOUS

## 2022-03-27 MED ORDER — ROCURONIUM BROMIDE 10 MG/ML (PF) SYRINGE
PREFILLED_SYRINGE | INTRAVENOUS | Status: AC
  Filled 2022-03-27: qty 10

## 2022-03-27 MED ORDER — DEXAMETHASONE SODIUM PHOSPHATE 10 MG/ML IJ SOLN
INTRAMUSCULAR | Status: DC | PRN
  Administered 2022-03-27: 8 mg via INTRAVENOUS

## 2022-03-27 MED ORDER — FENTANYL CITRATE (PF) 100 MCG/2ML IJ SOLN
INTRAMUSCULAR | Status: AC
  Filled 2022-03-27: qty 2

## 2022-03-27 MED ORDER — ALBUMIN HUMAN 5 % IV SOLN: INTRAVENOUS | Status: DC | PRN

## 2022-03-27 MED ORDER — FENTANYL CITRATE (PF) 100 MCG/2ML IJ SOLN
INTRAMUSCULAR | Status: DC | PRN
  Administered 2022-03-27 (×2): 50 ug via INTRAVENOUS

## 2022-03-27 MED ORDER — LIDOCAINE HCL (PF) 2 % IJ SOLN
INTRAMUSCULAR | Status: AC
  Filled 2022-03-27: qty 5

## 2022-03-27 MED ORDER — ONDANSETRON HCL 4 MG/2ML IJ SOLN
INTRAMUSCULAR | Status: AC
  Filled 2022-03-27: qty 2

## 2022-03-27 MED ORDER — DEXAMETHASONE SODIUM PHOSPHATE 10 MG/ML IJ SOLN
INTRAMUSCULAR | Status: AC
  Filled 2022-03-27: qty 1

## 2022-03-27 MED ORDER — EPHEDRINE 5 MG/ML INJ
INTRAVENOUS | Status: AC
  Filled 2022-03-27: qty 5

## 2022-03-27 MED ORDER — EPHEDRINE SULFATE-NACL 50-0.9 MG/10ML-% IV SOSY
PREFILLED_SYRINGE | INTRAVENOUS | Status: DC | PRN
  Administered 2022-03-27: 10 mg via INTRAVENOUS

## 2022-03-27 MED ORDER — HYDROMORPHONE HCL 1 MG/ML IJ SOLN
0.2500 mg | INTRAMUSCULAR | Status: DC | PRN
  Administered 2022-03-27 (×2): 0.5 mg via INTRAVENOUS

## 2022-03-27 MED ORDER — PROPOFOL 10 MG/ML IV BOLUS
INTRAVENOUS | Status: DC | PRN
  Administered 2022-03-27: 140 mg via INTRAVENOUS

## 2022-03-27 MED ORDER — ACETAMINOPHEN 10 MG/ML IV SOLN
INTRAVENOUS | Status: DC | PRN
  Administered 2022-03-27: 1000 mg via INTRAVENOUS

## 2022-03-27 MED ORDER — PHENYLEPHRINE 80 MCG/ML (10ML) SYRINGE FOR IV PUSH (FOR BLOOD PRESSURE SUPPORT)
PREFILLED_SYRINGE | INTRAVENOUS | Status: AC
  Filled 2022-03-27: qty 10

## 2022-03-27 MED ORDER — ORAL CARE MOUTH RINSE: 15.0000 mL | OROMUCOSAL | Status: DC | PRN

## 2022-03-27 MED ORDER — CEFAZOLIN SODIUM 1 G IJ SOLR
INTRAMUSCULAR | Status: AC
  Filled 2022-03-27: qty 20

## 2022-03-27 MED ORDER — SUCCINYLCHOLINE CHLORIDE 200 MG/10ML IV SOSY
PREFILLED_SYRINGE | INTRAVENOUS | Status: AC
  Filled 2022-03-27: qty 10

## 2022-03-27 SURGICAL SUPPLY — 35 items
BAG COUNTER SPONGE SURGICOUNT (BAG) ×1 IMPLANT
BLADE EXTENDED COATED 6.5IN (ELECTRODE) IMPLANT
CHLORAPREP W/TINT 26 (MISCELLANEOUS) ×2 IMPLANT
COVER MAYO STAND STRL (DRAPES) ×6 IMPLANT
COVER SURGICAL LIGHT HANDLE (MISCELLANEOUS) ×2 IMPLANT
DRAPE LAPAROSCOPIC ABDOMINAL (DRAPES) ×2 IMPLANT
DRSG OPSITE POSTOP 4X10 (GAUZE/BANDAGES/DRESSINGS) IMPLANT
DRSG OPSITE POSTOP 4X6 (GAUZE/BANDAGES/DRESSINGS) IMPLANT
DRSG OPSITE POSTOP 4X8 (GAUZE/BANDAGES/DRESSINGS) ×1 IMPLANT
ELECT REM PT RETURN 15FT ADLT (MISCELLANEOUS) ×2 IMPLANT
GAUZE SPONGE 4X4 12PLY STRL (GAUZE/BANDAGES/DRESSINGS) IMPLANT
GLOVE BIO SURGEON STRL SZ7 (GLOVE) ×4 IMPLANT
GLOVE BIOGEL PI IND STRL 7.5 (GLOVE) ×2 IMPLANT
GLOVE BIOGEL PI INDICATOR 7.5 (GLOVE) ×2
GOWN STRL REUS W/ TWL LRG LVL3 (GOWN DISPOSABLE) ×2 IMPLANT
GOWN STRL REUS W/TWL LRG LVL3 (GOWN DISPOSABLE) ×4
HANDLE SUCTION POOLE (INSTRUMENTS) IMPLANT
KIT TURNOVER KIT A (KITS) ×1 IMPLANT
PACK COLON (CUSTOM PROCEDURE TRAY) ×2 IMPLANT
PENCIL SMOKE EVACUATOR (MISCELLANEOUS) IMPLANT
STAPLER VISISTAT 35W (STAPLE) ×2 IMPLANT
SUCTION POOLE HANDLE (INSTRUMENTS) ×2
SUT NOVA NAB GS-21 0 18 T12 DT (SUTURE) IMPLANT
SUT NOVA NAB GS-21 1 T12 (SUTURE) IMPLANT
SUT PDS AB 1 TP1 96 (SUTURE) ×2 IMPLANT
SUT SILK 2 0 (SUTURE) ×2
SUT SILK 2 0 SH CR/8 (SUTURE) ×2 IMPLANT
SUT SILK 2-0 18XBRD TIE 12 (SUTURE) ×1 IMPLANT
SUT SILK 3 0 (SUTURE) ×2
SUT SILK 3 0 SH CR/8 (SUTURE) ×2 IMPLANT
SUT SILK 3-0 18XBRD TIE 12 (SUTURE) ×1 IMPLANT
TOWEL OR 17X26 10 PK STRL BLUE (TOWEL DISPOSABLE) IMPLANT
TOWEL OR NON WOVEN STRL DISP B (DISPOSABLE) ×2 IMPLANT
TRAY FOLEY MTR SLVR 14FR STAT (SET/KITS/TRAYS/PACK) IMPLANT
TRAY FOLEY MTR SLVR 16FR STAT (SET/KITS/TRAYS/PACK) IMPLANT

## 2022-03-27 NOTE — Op Note (Signed)
Preoperative diagnosis: sbo, no prior surgery Postoperative diagnosis: adhesive bowel obstruction Procedure: exploratory laparotomy with lysis of adhesions Surgeon: Dr Serita Grammes Anes general EBL minimal Complications none Specimens none Drains none Sponge and needle count correct Dispo recovery stable.  Indications: this is an 20 yof with no prior surgery presented yesterday with signs and symptoms of SBO. She did not improve overnight and contrast remained in stomach.  She was tender this am and I discussed proceeding to the OR.  Procedure: After informed consent was obtained she was taken to the operating room.  She was given antibiotics.  SCDs were placed.  She was placed under general anesthesia without complication.  She was prepped and draped in the standard sterile surgical fashion.  Surgical timeout was then performed.  I made a midline incision and entered into her peritoneal cavity.  She had a fair amount of fluid present.  I then was able to eviscerate her.  She had very dilated small bowel up into the ileum.  I was able to identify her colon and this had no lesions.  I went down into her pelvis well and felt like there was some scar tissue.  There was an adhesion from her uterus to her anterior abdominal wall.  I took this down and oversewed this with a 2-0 Vicryl suture.  This released the bowel obstruction.  There was a hemorrhagic portion of her intestine but this was viable and looked nice and pink at the completion of the operation.  I ran the entire small bowel and the obstruction was relieved.  Again the bowel was all viable.  I then placed the intestines back in the abdomen.  I placed the omentum overlying the small intestine.  Then closed this with #1 looped PDS.  Staples were used.  A dressing was placed.  She tolerated this well was extubated and transferred to recovery stable.

## 2022-03-27 NOTE — Transfer of Care (Signed)
Immediate Anesthesia Transfer of Care Note  Patient: Jenny Madden  Procedure(s) Performed: EXPLORATORY LAPAROTOMY, POSSIBLE SMALL BOWEL RESECTION  Patient Location: PACU  Anesthesia Type:General  Level of Consciousness: sedated  Airway & Oxygen Therapy: Patient Spontanous Breathing and Patient connected to face mask oxygen  Post-op Assessment: Report given to RN and Post -op Vital signs reviewed and stable  Post vital signs: Reviewed and stable  Last Vitals:  Vitals Value Taken Time  BP 129/70 03/27/22 1604  Temp    Pulse 74 03/27/22 1605  Resp 21 03/27/22 1605  SpO2 100 % 03/27/22 1605  Vitals shown include unvalidated device data.  Last Pain:  Vitals:   03/27/22 1308  TempSrc: Oral  PainSc:          Complications: No notable events documented.

## 2022-03-27 NOTE — Hospital Course (Signed)
HPI per Dr. Tennis Must on 03/26/22  Jenny Madden is a 81 y.o. female with medical history significant of allergy rhinitis, anxiety, benign neoplasm of colon, left renal cancer, cataracts, chronic diastolic heart failure, constipation, diverticulosis, dysphagia, heart murmur, hypertension, no new specific abnormal EKG, osteoporosis, hyperlipidemia, type 2 diabetes, hypertension, unspecified tinnitus, vitamin D deficiency who is coming to the emergency department due to abdominal pain that started around midnight associated with at least 5 episodes of emesis according to her daughter.  The pain is seen in her epigastric area and radiates to the right lower quadrant.  She gets frequently constipated.  No appetite changes, no weight loss, melena or hematochezia.  She denied fever, chills, rhinorrhea, sore throat, wheezing or hemoptysis.  No chest pain, palpitations, diaphoresis, PND, orthopnea or pitting edema of the lower extremities.  No flank pain, frequency or hematuria, but has been having mild dysuria for the past 2 weeks.  No polyuria, polydipsia, polyphagia or blurred vision.    ED course: Initial vital signs were temperature 98.9 F, pulse 80, respiration 20, BP 155/68 mmHg O2 sat 96% on room air.  The patient received fentanyl 25 mcg IVP, NS 500 mL bolus, ondansetron 4 mg IVP and ceftriaxone 1 g IVPB.  I changed antiemetic to prochlorperazine 7.5 mg every 6 hours as needed as Zofran was not controlling her nausea.   Lab work: Her urinalysis showed moderate hemoglobinuria, positive nitrites, moderate leukocyte esterase, 21-50 WBC with rare bacteria on microscopic examination.  CBC with a white count of 10.8, hemoglobin 12.6 g/dL platelets 231.  Lipase was normal.  CMP showed a glucose of 144 and creatinine of 1.01 mg/dL.  The rest of the CMP measurements were normal.  Repeat CBC normal.  Lactic acid normal.  Magnesium is 2.5 mg/dL.  Follow-up chemistry showing normal, but lower potassium level and  glucose of 152 mg/dL.   Imaging: CT abdomen/pelvis with contrast showed findings consistent with small bowel obstruction with 2 apparent transition point identified in the anterior right pelvis.  There is a small volume interloop mesenteric fluid in these region and small bowel distal to this area is completely decompressed.  While appearance may not be related to adhesions, internal hernia is not excluded.  There is colonic diverticulosis without diverticulitis.  Aortic atherosclerosis.  Please see images and full regular report for further details.  There is bibasilar atelectasis.  **Interim History   General surgery evaluated and felt that she is not getting any better and because she had no progress of contrast passed to the small bowel to the colon they recommended surgical intervention via laparotomy given that the film this morning actually had more of increasing bowel dilatation.

## 2022-03-27 NOTE — Progress Notes (Signed)
Surgical consent obtained and placed in patient's chart.  Patient transported to Short Stay Pre-Op area by Short Stay NT/Transport staff.

## 2022-03-27 NOTE — TOC Initial Note (Signed)
Transition of Care [facility]) - Initial/Assessment Note    Patient Details  Name: Jenny Madden MRN: [id number] Date of Birth: [birth year]  Transition of Care [facility]) CM/SW Contact:    Leeroy Cha, RN Phone Number: 03/27/2022, 7:51 AM  Clinical Narrative:                  Transition of Care [facility]) Screening Note   Patient Details  Name: Jenny Madden Date of Birth: August 14, 1941   Transition of Care [facility]) CM/SW Contact:    Leeroy Cha, RN Phone Number: 03/27/2022, 7:51 AM    Transition of Care Department [facility]) has reviewed patient and no TOC needs have been identified at this time. We will continue to monitor patient advancement through interdisciplinary progression rounds. If new patient transition needs arise, please place a TOC consult.    Expected Discharge Plan: Home/Self Care Barriers to Discharge: Continued Medical Work up   Patient Goals and CMS Choice Patient states their goals for this hospitalization and ongoing recovery are:: i would like to go back to my home CMS Medicare.gov Compare Post Acute Care list provided to:: Patient    Expected Discharge Plan and Services Expected Discharge Plan: Home/Self Care   Discharge Planning Services: CM Consult   Living arrangements for the past 2 months: Single Family Home                                      Prior Living Arrangements/Services Living arrangements for the past 2 months: Single Family Home Lives with:: Self Patient language and need for interpreter reviewed:: Yes Do you feel safe going back to the place where you live?: Yes            Criminal Activity/Legal Involvement Pertinent to Current Situation/Hospitalization: No - Comment as needed  Activities of Daily Living Home Assistive Devices/Equipment: Dentures (specify type) (upper partial plate, reading glasses) ADL Screening (condition at time of admission) Patient's cognitive ability adequate to safely complete daily  activities?: Yes Is the patient deaf or have difficulty hearing?: No Does the patient have difficulty seeing, even when wearing glasses/contacts?: No Does the patient have difficulty concentrating, remembering, or making decisions?: No Patient able to express need for assistance with ADLs?: Yes Does the patient have difficulty dressing or bathing?: No Independently performs ADLs?: Yes (appropriate for developmental age) Does the patient have difficulty walking or climbing stairs?: No Weakness of Legs: Both Weakness of Arms/Hands: Both  Permission Sought/Granted                  Emotional Assessment Appearance:: Appears stated age     Orientation: : Oriented to Self, Oriented to Place, Oriented to  Time, Oriented to Situation Alcohol / Substance Use: Not Applicable, Never Used Psych Involvement: No (comment)  Admission diagnosis:  Small bowel obstruction ([place]) [K56.609] SBO (small bowel obstruction) ([place]) [K56.609] Patient Active Problem List   Diagnosis Date Noted   SBO (small bowel obstruction) ([place]) 03/26/2022   Chronic diastolic heart failure ([place]) 03/26/2022   Acute UTI (urinary tract infection) 03/26/2022   Aortic atherosclerosis ([place]) 03/21/2021   Easy bruising 12/20/2020   Nausea 09/14/2020   Venous insufficiency 03/17/2020   Palpitations 12/08/2019   Right shoulder pain 12/16/2018   Left renal mass 01/18/2016   Schatzki's ring 10/19/2015   Constipation 10/19/2015   Routine general medical examination at a health care facility 07/13/2015   Osteopenia  12/27/2011   Vitamin D deficiency 12/27/2011   Undiagnosed cardiac murmurs 05/12/2008   Diabetes mellitus type 2 with complications ([place]) 16/42/9037   Hyperlipidemia associated with type 2 diabetes mellitus ([place]) 07/29/2007   Essential hypertension 07/29/2007   Allergic rhinitis 07/29/2007   GERD 07/29/2007   PCP:  Hoyt Koch, MD Pharmacy:   [facility] [place] [address] [facility] [address] [place] [place] [address] Phone: [phone] Fax: [phone]  [id number] [place] [id number]: [phone]: [phone]    Social Determinants of Health (SDOH) Interventions    Readmission Risk Interventions     No data to display

## 2022-03-27 NOTE — Plan of Care (Signed)

## 2022-03-27 NOTE — Progress Notes (Signed)
PROGRESS NOTE    Jenny Madden  RCB:[id number] DOB: 08-18-1941 DOA: 03/26/2022 PCP: Hoyt Koch, MD   Brief Narrative:  HPI per Dr. Tennis Must on 03/26/22  Jenny Madden is a 81 y.o. female with medical history significant of allergy rhinitis, anxiety, benign neoplasm of colon, left renal cancer, cataracts, chronic diastolic heart failure, constipation, diverticulosis, dysphagia, heart murmur, hypertension, no new specific abnormal EKG, osteoporosis, hyperlipidemia, type 2 diabetes, hypertension, unspecified tinnitus, vitamin D deficiency who is coming to the emergency department due to abdominal pain that started around midnight associated with at least 5 episodes of emesis according to her daughter.  The pain is seen in her epigastric area and radiates to the right lower quadrant.  She gets frequently constipated.  No appetite changes, no weight loss, melena or hematochezia.  She denied fever, chills, rhinorrhea, sore throat, wheezing or hemoptysis.  No chest pain, palpitations, diaphoresis, PND, orthopnea or pitting edema of the lower extremities.  No flank pain, frequency or hematuria, but has been having mild dysuria for the past 2 weeks.  No polyuria, polydipsia, polyphagia or blurred vision.    ED course: Initial vital signs were temperature 98.9 F, pulse 80, respiration 20, BP 155/68 mmHg O2 sat 96% on room air.  The patient received fentanyl 25 mcg IVP, NS 500 mL bolus, ondansetron 4 mg IVP and ceftriaxone 1 g IVPB.  I changed antiemetic to prochlorperazine 7.5 mg every 6 hours as needed as Zofran was not controlling her nausea.   Lab work: Her urinalysis showed moderate hemoglobinuria, positive nitrites, moderate leukocyte esterase, 21-50 WBC with rare bacteria on microscopic examination.  CBC with a white count of 10.8, hemoglobin 12.6 g/dL platelets 231.  Lipase was normal.  CMP showed a glucose of 144 and creatinine of 1.01 mg/dL.  The rest of the CMP measurements were  normal.  Repeat CBC normal.  Lactic acid normal.  Magnesium is 2.5 mg/dL.  Follow-up chemistry showing normal, but lower potassium level and glucose of 152 mg/dL.   Imaging: CT abdomen/pelvis with contrast showed findings consistent with small bowel obstruction with 2 apparent transition point identified in the anterior right pelvis.  There is a small volume interloop mesenteric fluid in these region and small bowel distal to this area is completely decompressed.  While appearance may not be related to adhesions, internal hernia is not excluded.  There is colonic diverticulosis without diverticulitis.  Aortic atherosclerosis.  Please see images and full regular report for further details.  There is bibasilar atelectasis.  **Interim History   General surgery evaluated and felt that she is not getting any better and because she had no progress of contrast passed to the small bowel to the colon they recommended surgical intervention via laparotomy given that the film this morning actually had more of increasing bowel dilatation.    Assessment and Plan:  SBO (small bowel obstruction) ([place]) -Observation/MedSurg changed to Inpatient Tele -Keep NPO. -Continue IV fluids. -Analgesics as needed. -Antiemetics as needed. -KUB done today worse -Pantoprazole 40 mg IVP every 24 hours. -Keep electrolytes optimized with K+ >4.0 and Mag > 2.0. -Follow-up CBC and CMP in AM. -Follow-up imaging in the morning. -General surgery input appreciated and going for surgical intervention later today  Acute UTI (urinary tract infection) -Continue ceftriaxone 1 g IVPB daily. -Urinalysis done and showed a clear appearance with yellow color urine, negative glucose, moderate hemoglobin, moderate leukocytes, positive nitrites, rare bacteria, 0-5 RBCs per high-power field, 0-5 squamous epithelial cells, 21-50 WBCs -  Urine culture done and showed less than 10,000 colony-forming units of significant growth   Diabetes mellitus  type 2 with complications (HCC) -Currently NPO. -CBG every 6 hours; CBGs ranging from 113-127   Aortic atherosclerosis (HCC) Hyperlipidemia associated with type 2 diabetes mellitus ([place]) -Resume atorvastatin once cleared for oral intake.   Essential hypertension -Holding oral antihypertensives. Metoprolol injection 5 mg twice a day. Monitor blood pressure and heart rate.   GERD -Daily Pantoprazole 40 mg IVP.   Constipation -Consider enema if no quick resolution.   Palpitations -Keep electrolytes optimized.   Chronic Diastolic Heart Failure (HCC) -No signs of decompensation at this time. -Continue to monitor for signs and symptoms of volume overload  DVT prophylaxis: enoxaparin (LOVENOX) injection 40 mg Start: 03/26/22 1400    Code Status: Full Code Family Communication: Discussed with daughter at bedside  Disposition Plan:  Level of care: Telemetry Status is: Inpatient Remains inpatient appropriate because: Needs further surgical intervention   Consultants:  General surgery  Procedures:  Going for the surgical intervention  Antimicrobials:  Anti-infectives (From admission, onward)    Start     Dose/Rate Route Frequency Ordered Stop   03/27/22 0800  [MAR Hold]  cefTRIAXone (ROCEPHIN) 1 g in sodium chloride 0.9 % 100 mL IVPB        (MAR Hold since Tue 03/27/2022 at 1236.Hold Reason: Transfer to a Procedural area)   1 g 200 mL/hr over 30 Minutes Intravenous Every 24 hours 03/26/22 1319     03/26/22 0745  cefTRIAXone (ROCEPHIN) 1 g in sodium chloride 0.9 % 100 mL IVPB        1 g 200 mL/hr over 30 Minutes Intravenous  Once 03/26/22 0732 03/26/22 0935       Subjective: Seen and examined at bedside and she is doing okay.  NG tube in place and not passing any gas.  Felt okay but did have some abdominal discomfort.  No other concerns requested this time  Objective: Vitals:   03/27/22 0546 03/27/22 1057 03/27/22 1209 03/27/22 1238  BP: 127/70 125/67 125/68   Pulse:  90 85 79   Resp: '16 18 16   '$ Temp: 99 F (37.2 C)  99.1 F (37.3 C)   TempSrc: Oral  Oral   SpO2: 98% 97% 99%   Weight:    66.8 kg  Height:    '5\' 2"'$  (1.575 m)    Intake/Output Summary (Last 24 hours) at 03/27/2022 1251 Last data filed at 03/27/2022 1234 Gross per 24 hour  Intake 2414.49 ml  Output 400 ml  Net 2014.49 ml   Filed Weights   03/26/22 0143 03/26/22 2009 03/27/22 1238  Weight: 66 kg 66.8 kg 66.8 kg   Examination: Physical Exam:  Constitutional: WN/WD overweight African-American female currently and no acute distress appears calm Respiratory: Diminished to auscultation bilaterally, no wheezing, rales, rhonchi or crackles. Normal respiratory effort and patient is not tachypenic. No accessory muscle use.  Unlabored breathing Cardiovascular: RRR, no murmurs / rubs / gallops. S1 and S2 auscultated. No extremity edema. Abdomen: Soft, slightly-tender, distended secondary body habitus. Bowel sounds positive.  GU: Deferred. Musculoskeletal: No clubbing / cyanosis of digits/nails. No joint deformity upper and lower extremities.  Neurologic: CN 2-12 grossly intact with no focal deficits. Romberg sign and cerebellar reflexes not assessed.  Psychiatric: Normal judgment and insight. Alert and oriented x 3. Normal mood and appropriate affect.   Data Reviewed: I have personally reviewed following labs and imaging studies  CBC: Recent Labs  Lab 03/26/22  0139 03/26/22 0820 03/27/22 0457  WBC 10.8* 9.8 7.4  NEUTROABS 8.9*  --   --   HGB 12.6 13.3 13.0  HCT 41.6 43.2 42.4  MCV 87.8 84.9 85.8  PLT 231 241 254   Basic Metabolic Panel: Recent Labs  Lab 03/26/22 0139 03/26/22 0820 03/27/22 0457  NA 140 138 142  K 4.1 3.7 4.1  CL 108 105 110  CO2 '22 23 23  '$ GLUCOSE 144* 152* 121*  BUN '13 11 14  '$ CREATININE 1.01* 0.88 0.88  CALCIUM 9.3 9.5 9.1  MG  --  2.5*  --    GFR: Estimated Creatinine Clearance: 45 mL/min (by C-G formula based on SCr of 0.88 mg/dL). Liver Function  Tests: Recent Labs  Lab 03/26/22 0139 03/27/22 0457  AST 24 21  ALT 13 12  ALKPHOS 64 57  BILITOT 0.5 0.8  PROT 7.6 7.0  ALBUMIN 4.2 3.6   Recent Labs  Lab 03/26/22 0139  LIPASE 46   No results for input(s): "AMMONIA" in the last 168 hours. Coagulation Profile: No results for input(s): "INR", "PROTIME" in the last 168 hours. Cardiac Enzymes: No results for input(s): "CKTOTAL", "CKMB", "CKMBINDEX", "TROPONINI" in the last 168 hours. BNP (last 3 results) No results for input(s): "PROBNP" in the last 8760 hours. HbA1C: No results for input(s): "HGBA1C" in the last 72 hours. CBG: Recent Labs  Lab 03/26/22 1905 03/27/22 0010 03/27/22 0605  GLUCAP 127* 123* 119*   Lipid Profile: No results for input(s): "CHOL", "HDL", "LDLCALC", "TRIG", "CHOLHDL", "LDLDIRECT" in the last 72 hours. Thyroid Function Tests: No results for input(s): "TSH", "T4TOTAL", "FREET4", "T3FREE", "THYROIDAB" in the last 72 hours. Anemia Panel: No results for input(s): "VITAMINB12", "FOLATE", "FERRITIN", "TIBC", "IRON", "RETICCTPCT" in the last 72 hours. Sepsis Labs: Recent Labs  Lab 03/26/22 0854  LATICACIDVEN 1.4    Recent Results (from the past 240 hour(s))  Urine Culture     Status: Abnormal   Collection Time: 03/26/22  1:19 PM   Specimen: Urine, Clean Catch  Result Value Ref Range Status   Specimen Description   Final    URINE, CLEAN CATCH Performed at [facility], [place] [address] [place], [place] [address]    Special Requests   Final    NONE Performed at [facility], [place] [address], [place], [place] [address]    Culture (A)  Final    <10,000 COLONIES/mL INSIGNIFICANT GROWTH Performed at [place] [address] [place], [place] [address]    Report Status 03/27/2022 FINAL  Final     Radiology Studies: DG Abd Portable 1V  Result Date: 03/27/2022 CLINICAL DATA:  Female at age 34 presenting for evaluation of suspected small-bowel  obstruction. EXAM: PORTABLE ABDOMEN - 1 VIEW COMPARISON:  March 26, 2022. FINDINGS: Gastric tube tip in the distal stomach. Side port well below GE junction. Retained contrast within the gastric fundus. Dilute contrast throughout small bowel loops which remain dilated in the RIGHT hemiabdomen. No signs of colonic contrast at this time. Bowel loops appear more distended than on the CT and the most recent abdominal radiograph. IMPRESSION: Gastric tube remains in place. Despite this there is increasing distension of small bowel loops over a series of prior studies. Concentrated contrast remaining in the stomach in the gastric fundus. Dilute contrast within dilated small bowel loops in the RIGHT hemiabdomen, compatible with small bowel obstruction. Electronically Signed   By: Zetta Bills M.D.   On: 03/27/2022 08:55   DG Abd  Portable 1V-Small Bowel Obstruction Protocol-initial, 8 hr delay  Result Date: 03/26/2022 CLINICAL DATA:  8 hour post contrast small bowel protocol EXAM: PORTABLE ABDOMEN - 1 VIEW COMPARISON:  03/26/2022 radiograph and CT FINDINGS: Moderate enteral contrast within the stomach. Esophageal tube tip overlies the distal stomach. Dilute contrast faintly visible within small bowel. No definitive colon or rectal contrast is present. There is contrast within the urinary bladder. IMPRESSION: Contrast bolus in the proximal stomach. Small amount of dilute contrast within small bowel but no definitive colon or rectal contrast is evident. Electronically Signed   By: Donavan Foil M.D.   On: 03/26/2022 21:19   ECHOCARDIOGRAM COMPLETE  Result Date: 03/26/2022    ECHOCARDIOGRAM REPORT   Patient Name:   SHENAYA LEBO Date of Exam: 03/26/2022 Medical Rec #:  [id number]         Height:       62.0 in Accession #:    [id number]        Weight:       145.5 lb Date of Birth:  Jul 01, 1941         BSA:          1.670 m Patient Age:    84 years          BP:           152/76 mmHg Patient Gender: F                 HR:            90 bpm. Exam Location:  Inpatient Procedure: 2D Echo, Color Doppler and Cardiac Doppler Indications:    Diastolic dysfunction  History:        Patient has prior history of Echocardiogram examinations, most                 recent 02/17/2020. Chronic diastolic heart failure,                 Signs/Symptoms:Murmur; Risk Factors:Hypertension and Diabetes.  Sonographer:    Joette Catching Referring Phys: [phone] [place]  Sonographer Comments: Technically difficult study due to poor echo windows. IMPRESSIONS  1. Left ventricular ejection fraction, by estimation, is 65 to 70%. The left ventricle has hyperdynamic function. The left ventricle has no regional wall motion abnormalities. Left ventricular diastolic parameters are consistent with Grade I diastolic dysfunction (impaired relaxation). There is LV midcavitary obliteration in systole.  2. Right ventricular systolic function is normal. The right ventricular size is normal.  3. Left atrial size was severely dilated.  4. The mitral valve is normal in structure. No evidence of mitral valve regurgitation. No evidence of mitral stenosis. Moderate mitral annular calcification.  5. The aortic valve is normal in structure. There is mild calcification of the aortic valve. Aortic valve regurgitation is not visualized. Aortic valve sclerosis/calcification is present, without any evidence of aortic stenosis. Aortic valve area, by VTI measures 2.92 cm. Aortic valve mean gradient measures 12.0 mmHg. Aortic valve Vmax measures 2.47 m/s.  6. The inferior vena cava is normal in size with greater than 50% respiratory variability, suggesting right atrial pressure of 3 mmHg. FINDINGS  Left Ventricle: Left ventricular ejection fraction, by estimation, is 65 to 70%. The left ventricle has hyperdynamic function. The left ventricle has no regional wall motion abnormalities. The left ventricular internal cavity size was normal in size. There is no left ventricular  hypertrophy. Left ventricular diastolic parameters are consistent with Grade I diastolic dysfunction (impaired relaxation). Right Ventricle:  The right ventricular size is normal. No increase in right ventricular wall thickness. Right ventricular systolic function is normal. Left Atrium: Left atrial size was severely dilated. Right Atrium: Right atrial size was normal in size. Pericardium: There is no evidence of pericardial effusion. Mitral Valve: The mitral valve is normal in structure. Moderate mitral annular calcification. No evidence of mitral valve regurgitation. No evidence of mitral valve stenosis. Tricuspid Valve: The tricuspid valve is normal in structure. Tricuspid valve regurgitation is trivial. No evidence of tricuspid stenosis. Aortic Valve: The aortic valve is normal in structure. There is mild calcification of the aortic valve. Aortic valve regurgitation is not visualized. Aortic valve sclerosis/calcification is present, without any evidence of aortic stenosis. Aortic valve mean gradient measures 12.0 mmHg. Aortic valve peak gradient measures 24.4 mmHg. Aortic valve area, by VTI measures 2.92 cm. Pulmonic Valve: The pulmonic valve was normal in structure. Pulmonic valve regurgitation is not visualized. No evidence of pulmonic stenosis. Aorta: The aortic root is normal in size and structure. Venous: The inferior vena cava is normal in size with greater than 50% respiratory variability, suggesting right atrial pressure of 3 mmHg. IAS/Shunts: No atrial level shunt detected by color flow Doppler.  LEFT VENTRICLE PLAX 2D LVIDd:         3.40 cm   Diastology LVIDs:         2.10 cm   LV e' medial:    6.09 cm/s LV PW:         0.70 cm   LV E/e' medial:  19.0 LV IVS:        1.90 cm   LV e' lateral:   5.22 cm/s LVOT diam:     1.90 cm   LV E/e' lateral: 22.2 LV SV:         92 LV SV Index:   55 LVOT Area:     2.84 cm  RIGHT VENTRICLE             IVC RV Basal diam:  3.00 cm     IVC diam: 1.20 cm RV Mid diam:     2.70 cm RV S prime:     34.40 cm/s TAPSE (M-mode): 3.5 cm LEFT ATRIUM             Index        RIGHT ATRIUM           Index LA diam:        2.90 cm 1.74 cm/m   RA Area:     10.30 cm LA Vol (A2C):   95.1 ml 56.95 ml/m  RA Volume:   18.50 ml  11.08 ml/m LA Vol (A4C):   62.1 ml 37.19 ml/m LA Biplane Vol: 77.2 ml 46.23 ml/m  AORTIC VALVE                     PULMONIC VALVE AV Area (Vmax):    2.39 cm      PV Vmax:          1.94 m/s AV Area (Vmean):   2.45 cm      PV Peak grad:     15.1 mmHg AV Area (VTI):     2.92 cm      PR End Diast Vel: 9.24 msec AV Vmax:           247.00 cm/s AV Vmean:          154.000 cm/s AV VTI:  0.314 m AV Peak Grad:      24.4 mmHg AV Mean Grad:      12.0 mmHg LVOT Vmax:         208.00 cm/s LVOT Vmean:        133.000 cm/s LVOT VTI:          0.323 m LVOT/AV VTI ratio: 1.03  AORTA Ao Root diam: 2.90 cm Ao Asc diam:  3.50 cm MITRAL VALVE                TRICUSPID VALVE MV Area (PHT): 3.19 cm     TR Peak grad:   40.2 mmHg MV Decel Time: 238 msec     TR Vmax:        317.00 cm/s MV E velocity: 116.00 cm/s MV A velocity: 213.00 cm/s  SHUNTS MV E/A ratio:  0.54         Systemic VTI:  0.32 m                             Systemic Diam: 1.90 cm Glori Bickers MD Electronically signed by Glori Bickers MD Signature Date/Time: 03/26/2022/4:19:00 PM    Final    DG Abd Portable 1V-Small Bowel Protocol-Position Verification  Result Date: 03/26/2022 CLINICAL DATA:  Upper abdominal pain. EXAM: PORTABLE ABDOMEN - 1 VIEW COMPARISON:  December 30, 2019. FINDINGS: The bowel gas pattern is normal. Distal tip of nasogastric tube is seen in expected position of distal stomach. Residual contrast is noted in the urinary bladder. No radio-opaque calculi or other significant radiographic abnormality are seen. IMPRESSION: No abnormal bowel dilatation is noted. Electronically Signed   By: Marijo Conception M.D.   On: 03/26/2022 09:19   CT ABDOMEN PELVIS W CONTRAST  Result Date: 03/26/2022 CLINICAL DATA:   Nausea vomiting with right lower quadrant abdominal pain. EXAM: CT ABDOMEN AND PELVIS WITH CONTRAST TECHNIQUE: Multidetector CT imaging of the abdomen and pelvis was performed using the standard protocol following bolus administration of intravenous contrast. RADIATION DOSE REDUCTION: This exam was performed according to the departmental dose-optimization program which includes automated exposure control, adjustment of the mA and/or kV according to patient size and/or use of iterative reconstruction technique. CONTRAST:  131m OMNIPAQUE IOHEXOL 300 MG/ML  SOLN COMPARISON:  04/19/2021 FINDINGS: Lower chest: Chest basilar atelectasis noted both lower lobes. Hepatobiliary: No suspicious focal abnormality within the liver parenchyma. There is no evidence for gallstones, gallbladder wall thickening, or pericholecystic fluid. No intrahepatic or extrahepatic biliary dilation. Pancreas: No focal mass lesion. No dilatation of the main duct. No intraparenchymal cyst. No peripancreatic edema. Spleen: No splenomegaly. No focal mass lesion. Adrenals/Urinary Tract: No adrenal nodule or mass. Right kidney unremarkable. 5.8 cm simple cyst noted upper pole left kidney, similar to prior. No evidence for hydroureter. The urinary bladder shows no wall thickening although contour is distorted by dilated small bowel in the anterior right pelvis. Stomach/Bowel: Stomach is mildly distended and fluid-filled. Duodenum is normally positioned as is the ligament of Treitz. Proximal small bowel unremarkable. Mid small bowel is dilated and fluid-filled, measuring up to 3 cm diameter. An abrupt transition zone is identified in the anterior right pelvis, immediately adjacent to the distorted urinary bladder (axial image 60/series 2). Small bowel distal to this location than become slightly more distended before a second transition zone is identified in the anterior right lower quadrant on image 61/2. Small volume interloop mesenteric fluid  identified in the region of the right  lower quadrant and small bowel distal to this region is completely decompressed (see terminal ileum on image 41/2). The appendix is normal. No gross colonic mass. No colonic wall thickening. Diverticuli are seen scattered along the entire length of the colon without CT findings of diverticulitis. Vascular/Lymphatic: There is moderate atherosclerotic calcification of the abdominal aorta without aneurysm. There is no gastrohepatic or hepatoduodenal ligament lymphadenopathy. No retroperitoneal or mesenteric lymphadenopathy. No pelvic sidewall lymphadenopathy. Reproductive: Unremarkable. Other: None. Musculoskeletal: No worrisome lytic or sclerotic osseous abnormality. Degenerative disc disease noted lumbar spine. IMPRESSION: 1. Findings consistent with small bowel obstruction with 2 apparent transition points identified in the anterior right pelvis. There is a small volume interloop mesenteric fluid in this region and small bowel distal to this area is completely decompressed. While appearance may be related to adhesions, internal hernia is not excluded. 2. Colonic diverticulosis without diverticulitis. 3. Aortic Atherosclerosis (ICD10-I70.0). Electronically Signed   By: Misty Stanley M.D.   On: 03/26/2022 07:14    Scheduled Meds:  [MAR Hold] enoxaparin (LOVENOX) injection  40 mg Subcutaneous Q24H   [MAR Hold] metoprolol tartrate  5 mg Intravenous Q12H   [MAR Hold] pantoprazole (PROTONIX) IV  40 mg Intravenous Q24H   Continuous Infusions:  0.9 % NaCl with KCl 20 mEq / L Stopped (03/27/22 1223)   [MAR Hold] cefTRIAXone (ROCEPHIN)  IV Stopped (03/27/22 0929)    LOS: 1 day   Kerney Elbe, DO [facility] Available via Epic secure chat 7am-7pm After these hours, please refer to coverage provider listed on [email or web address] 03/27/2022, 12:51 PM

## 2022-03-27 NOTE — Anesthesia Procedure Notes (Signed)
Procedure Name: Intubation Date/Time: 03/27/2022 2:55 PM  Performed by: Lind Covert, CRNAPre-anesthesia Checklist: Patient identified, Emergency Drugs available, Suction available, Patient being monitored and Timeout performed Patient Re-evaluated:Patient Re-evaluated prior to induction Oxygen Delivery Method: Circle system utilized Preoxygenation: Pre-oxygenation with 100% oxygen Induction Type: IV induction, Rapid sequence and Cricoid Pressure applied Laryngoscope Size: Mac and 3 Grade View: Grade I Tube type: Oral Tube size: 7.0 mm Number of attempts: 1 Airway Equipment and Method: Stylet Placement Confirmation: ETT inserted through vocal cords under direct vision, positive ETCO2 and breath sounds checked- equal and bilateral Secured at: 22 cm Tube secured with: Tape Dental Injury: Teeth and Oropharynx as per pre-operative assessment

## 2022-03-27 NOTE — Anesthesia Preprocedure Evaluation (Addendum)
Anesthesia Evaluation  Patient identified by MRN, date of birth, ID band Patient awake    Reviewed: Allergy & Precautions, H&P , NPO status , Patient's Chart, lab work & pertinent test results  Airway Mallampati: II  TM Distance: >3 FB Neck ROM: Full    Dental no notable dental hx. (+) Teeth Intact, Dental Advisory Given   Pulmonary neg pulmonary ROS, former smoker,    Pulmonary exam normal breath sounds clear to auscultation       Cardiovascular hypertension, Pt. on medications  Rhythm:Regular Rate:Normal     Neuro/Psych Anxiety negative neurological ROS     GI/Hepatic Neg liver ROS, GERD  Medicated,  Endo/Other  diabetes, Well Controlled  Renal/GU Renal disease  negative genitourinary   Musculoskeletal   Abdominal   Peds  Hematology negative hematology ROS (+)   Anesthesia Other Findings   Reproductive/Obstetrics negative OB ROS                            Anesthesia Physical Anesthesia Plan  ASA: 2  Anesthesia Plan: General   Post-op Pain Management: Ofirmev IV (intra-op)*   Induction: Intravenous, Rapid sequence and Cricoid pressure planned  PONV Risk Score and Plan: 4 or greater and Ondansetron, Dexamethasone and Treatment may vary due to age or medical condition  Airway Management Planned: Oral ETT  Additional Equipment:   Intra-op Plan:   Post-operative Plan: Extubation in OR  Informed Consent: I have reviewed the patients History and Physical, chart, labs and discussed the procedure including the risks, benefits and alternatives for the proposed anesthesia with the patient or authorized representative who has indicated his/her understanding and acceptance.     Dental advisory given  Plan Discussed with: CRNA  Anesthesia Plan Comments:         Anesthesia Quick Evaluation

## 2022-03-27 NOTE — Progress Notes (Signed)
Subjective: Patient states she feels better today with less abdominal pain.  No bowel function.  Essentially no NGT output for unclear reasons.   Objective: Vital signs in last 24 hours: Temp:  [98.7 F (37.1 C)-99.6 F (37.6 C)] 99 F (37.2 C) (06/20 0546) Pulse Rate:  [79-101] 90 (06/20 0546) Resp:  [15-31] 16 (06/20 0546) BP: (127-170)/(66-82) 127/70 (06/20 0546) SpO2:  [95 %-100 %] 98 % (06/20 0546) Weight:  [66.8 kg] 66.8 kg (06/19 2009) Last BM Date : 03/25/22  Intake/Output from previous day: 06/19 0701 - 06/20 0700 In: 1759.7 [I.V.:1759.7] Out: -  Intake/Output this shift: Total I/O In: 220.4 [I.V.:220.4] Out: -   PE: Gen: NAD Heart: regular Lungs: CTAB Abd: soft, some distention, mildly tender on right side of abdomen, more in upper abdomen today, NGT flushes well, but with  minimal output.  Lab Results:  Recent Labs    03/26/22 0820 03/27/22 0457  WBC 9.8 7.4  HGB 13.3 13.0  HCT 43.2 42.4  PLT 241 248   BMET Recent Labs    03/26/22 0820 03/27/22 0457  NA 138 142  K 3.7 4.1  CL 105 110  CO2 23 23  GLUCOSE 152* 121*  BUN 11 14  CREATININE 0.88 0.88  CALCIUM 9.5 9.1   PT/INR No results for input(s): "LABPROT", "INR" in the last 72 hours. CMP     Component Value Date/Time   NA 142 03/27/2022 0457   K 4.1 03/27/2022 0457   CL 110 03/27/2022 0457   CO2 23 03/27/2022 0457   GLUCOSE 121 (H) 03/27/2022 0457   BUN 14 03/27/2022 0457   CREATININE 0.88 03/27/2022 0457   CREATININE 1.00 (H) 01/01/2017 0954   CALCIUM 9.1 03/27/2022 0457   PROT 7.0 03/27/2022 0457   ALBUMIN 3.6 03/27/2022 0457   AST 21 03/27/2022 0457   ALT 12 03/27/2022 0457   ALKPHOS 57 03/27/2022 0457   BILITOT 0.8 03/27/2022 0457   GFRNONAA >60 03/27/2022 0457   GFRNONAA 55 (L) 01/01/2017 0954   GFRAA 54 (L) 12/29/2019 2312   GFRAA 63 01/01/2017 0954   Lipase     Component Value Date/Time   LIPASE 46 03/26/2022 0139       Studies/Results: DG Abd  Portable 1V  Result Date: 03/27/2022 CLINICAL DATA:  Female at age 81 presenting for evaluation of suspected small-bowel obstruction. EXAM: PORTABLE ABDOMEN - 1 VIEW COMPARISON:  March 26, 2022. FINDINGS: Gastric tube tip in the distal stomach. Side port well below GE junction. Retained contrast within the gastric fundus. Dilute contrast throughout small bowel loops which remain dilated in the RIGHT hemiabdomen. No signs of colonic contrast at this time. Bowel loops appear more distended than on the CT and the most recent abdominal radiograph. IMPRESSION: Gastric tube remains in place. Despite this there is increasing distension of small bowel loops over a series of prior studies. Concentrated contrast remaining in the stomach in the gastric fundus. Dilute contrast within dilated small bowel loops in the RIGHT hemiabdomen, compatible with small bowel obstruction. Electronically Signed   By: Zetta Bills M.D.   On: 03/27/2022 08:55   DG Abd Portable 1V-Small Bowel Obstruction Protocol-initial, 8 hr delay  Result Date: 03/26/2022 CLINICAL DATA:  8 hour post contrast small bowel protocol EXAM: PORTABLE ABDOMEN - 1 VIEW COMPARISON:  03/26/2022 radiograph and CT FINDINGS: Moderate enteral contrast within the stomach. Esophageal tube tip overlies the distal stomach. Dilute contrast faintly visible within small bowel. No definitive  colon or rectal contrast is present. There is contrast within the urinary bladder. IMPRESSION: Contrast bolus in the proximal stomach. Small amount of dilute contrast within small bowel but no definitive colon or rectal contrast is evident. Electronically Signed   By: Donavan Foil M.D.   On: 03/26/2022 21:19   ECHOCARDIOGRAM COMPLETE  Result Date: 03/26/2022    ECHOCARDIOGRAM REPORT   Patient Name:   CARINA CHAPLIN Date of Exam: 03/26/2022 Medical Rec #:  [id number]         Height:       62.0 in Accession #:    [id number]        Weight:       145.5 lb Date of Birth:  03-20-1941          BSA:          1.670 m Patient Age:    27 years          BP:           152/76 mmHg Patient Gender: F                 HR:           90 bpm. Exam Location:  Inpatient Procedure: 2D Echo, Color Doppler and Cardiac Doppler Indications:    Diastolic dysfunction  History:        Patient has prior history of Echocardiogram examinations, most                 recent 02/17/2020. Chronic diastolic heart failure,                 Signs/Symptoms:Murmur; Risk Factors:Hypertension and Diabetes.  Sonographer:    Joette Catching Referring Phys: [phone] [place]  Sonographer Comments: Technically difficult study due to poor echo windows. IMPRESSIONS  1. Left ventricular ejection fraction, by estimation, is 65 to 70%. The left ventricle has hyperdynamic function. The left ventricle has no regional wall motion abnormalities. Left ventricular diastolic parameters are consistent with Grade I diastolic dysfunction (impaired relaxation). There is LV midcavitary obliteration in systole.  2. Right ventricular systolic function is normal. The right ventricular size is normal.  3. Left atrial size was severely dilated.  4. The mitral valve is normal in structure. No evidence of mitral valve regurgitation. No evidence of mitral stenosis. Moderate mitral annular calcification.  5. The aortic valve is normal in structure. There is mild calcification of the aortic valve. Aortic valve regurgitation is not visualized. Aortic valve sclerosis/calcification is present, without any evidence of aortic stenosis. Aortic valve area, by VTI measures 2.92 cm. Aortic valve mean gradient measures 12.0 mmHg. Aortic valve Vmax measures 2.47 m/s.  6. The inferior vena cava is normal in size with greater than 50% respiratory variability, suggesting right atrial pressure of 3 mmHg. FINDINGS  Left Ventricle: Left ventricular ejection fraction, by estimation, is 65 to 70%. The left ventricle has hyperdynamic function. The left ventricle has no regional wall  motion abnormalities. The left ventricular internal cavity size was normal in size. There is no left ventricular hypertrophy. Left ventricular diastolic parameters are consistent with Grade I diastolic dysfunction (impaired relaxation). Right Ventricle: The right ventricular size is normal. No increase in right ventricular wall thickness. Right ventricular systolic function is normal. Left Atrium: Left atrial size was severely dilated. Right Atrium: Right atrial size was normal in size. Pericardium: There is no evidence of pericardial effusion. Mitral Valve: The mitral valve is normal in structure. Moderate mitral annular calcification.  No evidence of mitral valve regurgitation. No evidence of mitral valve stenosis. Tricuspid Valve: The tricuspid valve is normal in structure. Tricuspid valve regurgitation is trivial. No evidence of tricuspid stenosis. Aortic Valve: The aortic valve is normal in structure. There is mild calcification of the aortic valve. Aortic valve regurgitation is not visualized. Aortic valve sclerosis/calcification is present, without any evidence of aortic stenosis. Aortic valve mean gradient measures 12.0 mmHg. Aortic valve peak gradient measures 24.4 mmHg. Aortic valve area, by VTI measures 2.92 cm. Pulmonic Valve: The pulmonic valve was normal in structure. Pulmonic valve regurgitation is not visualized. No evidence of pulmonic stenosis. Aorta: The aortic root is normal in size and structure. Venous: The inferior vena cava is normal in size with greater than 50% respiratory variability, suggesting right atrial pressure of 3 mmHg. IAS/Shunts: No atrial level shunt detected by color flow Doppler.  LEFT VENTRICLE PLAX 2D LVIDd:         3.40 cm   Diastology LVIDs:         2.10 cm   LV e' medial:    6.09 cm/s LV PW:         0.70 cm   LV E/e' medial:  19.0 LV IVS:        1.90 cm   LV e' lateral:   5.22 cm/s LVOT diam:     1.90 cm   LV E/e' lateral: 22.2 LV SV:         92 LV SV Index:   55 LVOT  Area:     2.84 cm  RIGHT VENTRICLE             IVC RV Basal diam:  3.00 cm     IVC diam: 1.20 cm RV Mid diam:    2.70 cm RV S prime:     34.40 cm/s TAPSE (M-mode): 3.5 cm LEFT ATRIUM             Index        RIGHT ATRIUM           Index LA diam:        2.90 cm 1.74 cm/m   RA Area:     10.30 cm LA Vol (A2C):   95.1 ml 56.95 ml/m  RA Volume:   18.50 ml  11.08 ml/m LA Vol (A4C):   62.1 ml 37.19 ml/m LA Biplane Vol: 77.2 ml 46.23 ml/m  AORTIC VALVE                     PULMONIC VALVE AV Area (Vmax):    2.39 cm      PV Vmax:          1.94 m/s AV Area (Vmean):   2.45 cm      PV Peak grad:     15.1 mmHg AV Area (VTI):     2.92 cm      PR End Diast Vel: 9.24 msec AV Vmax:           247.00 cm/s AV Vmean:          154.000 cm/s AV VTI:            0.314 m AV Peak Grad:      24.4 mmHg AV Mean Grad:      12.0 mmHg LVOT Vmax:         208.00 cm/s LVOT Vmean:        133.000 cm/s LVOT VTI:  0.323 m LVOT/AV VTI ratio: 1.03  AORTA Ao Root diam: 2.90 cm Ao Asc diam:  3.50 cm MITRAL VALVE                TRICUSPID VALVE MV Area (PHT): 3.19 cm     TR Peak grad:   40.2 mmHg MV Decel Time: 238 msec     TR Vmax:        317.00 cm/s MV E velocity: 116.00 cm/s MV A velocity: 213.00 cm/s  SHUNTS MV E/A ratio:  0.54         Systemic VTI:  0.32 m                             Systemic Diam: 1.90 cm Glori Bickers MD Electronically signed by Glori Bickers MD Signature Date/Time: 03/26/2022/4:19:00 PM    Final    DG Abd Portable 1V-Small Bowel Protocol-Position Verification  Result Date: 03/26/2022 CLINICAL DATA:  Upper abdominal pain. EXAM: PORTABLE ABDOMEN - 1 VIEW COMPARISON:  December 30, 2019. FINDINGS: The bowel gas pattern is normal. Distal tip of nasogastric tube is seen in expected position of distal stomach. Residual contrast is noted in the urinary bladder. No radio-opaque calculi or other significant radiographic abnormality are seen. IMPRESSION: No abnormal bowel dilatation is noted. Electronically Signed   By: Marijo Conception M.D.   On: 03/26/2022 09:19   CT ABDOMEN PELVIS W CONTRAST  Result Date: 03/26/2022 CLINICAL DATA:  Nausea vomiting with right lower quadrant abdominal pain. EXAM: CT ABDOMEN AND PELVIS WITH CONTRAST TECHNIQUE: Multidetector CT imaging of the abdomen and pelvis was performed using the standard protocol following bolus administration of intravenous contrast. RADIATION DOSE REDUCTION: This exam was performed according to the departmental dose-optimization program which includes automated exposure control, adjustment of the mA and/or kV according to patient size and/or use of iterative reconstruction technique. CONTRAST:  138m OMNIPAQUE IOHEXOL 300 MG/ML  SOLN COMPARISON:  04/19/2021 FINDINGS: Lower chest: Chest basilar atelectasis noted both lower lobes. Hepatobiliary: No suspicious focal abnormality within the liver parenchyma. There is no evidence for gallstones, gallbladder wall thickening, or pericholecystic fluid. No intrahepatic or extrahepatic biliary dilation. Pancreas: No focal mass lesion. No dilatation of the main duct. No intraparenchymal cyst. No peripancreatic edema. Spleen: No splenomegaly. No focal mass lesion. Adrenals/Urinary Tract: No adrenal nodule or mass. Right kidney unremarkable. 5.8 cm simple cyst noted upper pole left kidney, similar to prior. No evidence for hydroureter. The urinary bladder shows no wall thickening although contour is distorted by dilated small bowel in the anterior right pelvis. Stomach/Bowel: Stomach is mildly distended and fluid-filled. Duodenum is normally positioned as is the ligament of Treitz. Proximal small bowel unremarkable. Mid small bowel is dilated and fluid-filled, measuring up to 3 cm diameter. An abrupt transition zone is identified in the anterior right pelvis, immediately adjacent to the distorted urinary bladder (axial image 60/series 2). Small bowel distal to this location than become slightly more distended before a second transition zone  is identified in the anterior right lower quadrant on image 61/2. Small volume interloop mesenteric fluid identified in the region of the right lower quadrant and small bowel distal to this region is completely decompressed (see terminal ileum on image 41/2). The appendix is normal. No gross colonic mass. No colonic wall thickening. Diverticuli are seen scattered along the entire length of the colon without CT findings of diverticulitis. Vascular/Lymphatic: There is moderate atherosclerotic calcification of the abdominal aorta  without aneurysm. There is no gastrohepatic or hepatoduodenal ligament lymphadenopathy. No retroperitoneal or mesenteric lymphadenopathy. No pelvic sidewall lymphadenopathy. Reproductive: Unremarkable. Other: None. Musculoskeletal: No worrisome lytic or sclerotic osseous abnormality. Degenerative disc disease noted lumbar spine. IMPRESSION: 1. Findings consistent with small bowel obstruction with 2 apparent transition points identified in the anterior right pelvis. There is a small volume interloop mesenteric fluid in this region and small bowel distal to this area is completely decompressed. While appearance may be related to adhesions, internal hernia is not excluded. 2. Colonic diverticulosis without diverticulitis. 3. Aortic Atherosclerosis (ICD10-I70.0). Electronically Signed   By: Misty Stanley M.D.   On: 03/26/2022 07:14    Anti-infectives: Anti-infectives (From admission, onward)    Start     Dose/Rate Route Frequency Ordered Stop   03/27/22 0800  cefTRIAXone (ROCEPHIN) 1 g in sodium chloride 0.9 % 100 mL IVPB        1 g 200 mL/hr over 30 Minutes Intravenous Every 24 hours 03/26/22 1319     03/26/22 0745  cefTRIAXone (ROCEPHIN) 1 g in sodium chloride 0.9 % 100 mL IVPB        1 g 200 mL/hr over 30 Minutes Intravenous  Once 03/26/22 0732 03/26/22 0935        Assessment/Plan SBO -SBO protocol with  no progress of contrast passed the small bowel into the colon.  Film  this morning actually more increase in bowel dilatation. -unclear why NGT has not more output than it does.  It is in good position, flushes well, cannister and system set up correctly... -given only surgery is c-section and with no improvement after 24 hours, we have recommended proceeding to the OR for surgical intervention via a laparotomy -the patient agrees with this and understands.  She had no other questions.  Daughter was at bedside.    -will use Rocephin as pre-op abx as well -pre-op EKG has been ordered -we can see base of lungs on CT scan -Echo yesterday with EF of 65%  FEN - NPO/NGT/IVFs VTE - Lovenox ID - Rocephin, UTI  UTI Chronic diastolic HF CKD HTN DM  I reviewed hospitalist notes, last 24 h vitals and pain scores, last 48 h intake and output, last 24 h labs and trends, and last 24 h imaging results.   LOS: 1 day    Henreitta Cea , [facility] 03/27/2022, 10:12 AM Please see Amion for pager number during day hours 7:00am-4:30pm or 7:00am -11:30am on weekends

## 2022-03-27 NOTE — Progress Notes (Signed)
Purewick was attached to low wall suction. Informed patient she can void now that it is hooked up. Patient voiced understanding.

## 2022-03-28 ENCOUNTER — Encounter (HOSPITAL_COMMUNITY): Payer: Self-pay | Admitting: General Surgery

## 2022-03-28 ENCOUNTER — Inpatient Hospital Stay (HOSPITAL_COMMUNITY): Payer: Medicare HMO

## 2022-03-28 DIAGNOSIS — I700 Atherosclerosis of aorta: Secondary | ICD-10-CM | POA: Diagnosis not present

## 2022-03-28 DIAGNOSIS — N390 Urinary tract infection, site not specified: Secondary | ICD-10-CM | POA: Diagnosis not present

## 2022-03-28 DIAGNOSIS — I5032 Chronic diastolic (congestive) heart failure: Secondary | ICD-10-CM | POA: Diagnosis not present

## 2022-03-28 DIAGNOSIS — K56609 Unspecified intestinal obstruction, unspecified as to partial versus complete obstruction: Secondary | ICD-10-CM | POA: Diagnosis not present

## 2022-03-28 LAB — CBC WITH DIFFERENTIAL/PLATELET
Abs Immature Granulocytes: 0.02 10*3/uL (ref 0.00–0.07)
Basophils Absolute: 0 10*3/uL (ref 0.0–0.1)
Basophils Relative: 0 %
Eosinophils Absolute: 0 10*3/uL (ref 0.0–0.5)
Eosinophils Relative: 0 %
HCT: 38.8 % (ref 36.0–46.0)
Hemoglobin: 11.8 g/dL — ABNORMAL LOW (ref 12.0–15.0)
Immature Granulocytes: 0 %
Lymphocytes Relative: 10 %
Lymphs Abs: 0.8 10*3/uL (ref 0.7–4.0)
MCH: 26.3 pg (ref 26.0–34.0)
MCHC: 30.4 g/dL (ref 30.0–36.0)
MCV: 86.6 fL (ref 80.0–100.0)
Monocytes Absolute: 0.8 10*3/uL (ref 0.1–1.0)
Monocytes Relative: 10 %
Neutro Abs: 6.7 10*3/uL (ref 1.7–7.7)
Neutrophils Relative %: 80 %
Platelets: 205 10*3/uL (ref 150–400)
RBC: 4.48 MIL/uL (ref 3.87–5.11)
RDW: 13.5 % (ref 11.5–15.5)
WBC: 8.4 10*3/uL (ref 4.0–10.5)
nRBC: 0 % (ref 0.0–0.2)

## 2022-03-28 LAB — COMPREHENSIVE METABOLIC PANEL
ALT: 10 U/L (ref 0–44)
AST: 20 U/L (ref 15–41)
Albumin: 3.4 g/dL — ABNORMAL LOW (ref 3.5–5.0)
Alkaline Phosphatase: 43 U/L (ref 38–126)
Anion gap: 6 (ref 5–15)
BUN: 22 mg/dL (ref 8–23)
CO2: 22 mmol/L (ref 22–32)
Calcium: 8.7 mg/dL — ABNORMAL LOW (ref 8.9–10.3)
Chloride: 113 mmol/L — ABNORMAL HIGH (ref 98–111)
Creatinine, Ser: 0.97 mg/dL (ref 0.44–1.00)
GFR, Estimated: 59 mL/min — ABNORMAL LOW (ref >60)
Glucose, Bld: 127 mg/dL — ABNORMAL HIGH (ref 70–99)
Potassium: 4.6 mmol/L (ref 3.5–5.1)
Sodium: 141 mmol/L (ref 135–145)
Total Bilirubin: 0.6 mg/dL (ref 0.3–1.2)
Total Protein: 6.4 g/dL — ABNORMAL LOW (ref 6.5–8.1)

## 2022-03-28 LAB — GLUCOSE, CAPILLARY
Glucose-Capillary: 107 mg/dL — ABNORMAL HIGH (ref 70–99)
Glucose-Capillary: 114 mg/dL — ABNORMAL HIGH (ref 70–99)
Glucose-Capillary: 141 mg/dL — ABNORMAL HIGH (ref 70–99)
Glucose-Capillary: 75 mg/dL (ref 70–99)
Glucose-Capillary: 83 mg/dL (ref 70–99)
Glucose-Capillary: 90 mg/dL (ref 70–99)

## 2022-03-28 LAB — BASIC METABOLIC PANEL
Anion gap: 7 (ref 5–15)
BUN: 22 mg/dL (ref 8–23)
CO2: 21 mmol/L — ABNORMAL LOW (ref 22–32)
Calcium: 8.8 mg/dL — ABNORMAL LOW (ref 8.9–10.3)
Chloride: 114 mmol/L — ABNORMAL HIGH (ref 98–111)
Creatinine, Ser: 0.91 mg/dL (ref 0.44–1.00)
GFR, Estimated: >60 mL/min (ref >60)
Glucose, Bld: 127 mg/dL — ABNORMAL HIGH (ref 70–99)
Potassium: 4.6 mmol/L (ref 3.5–5.1)
Sodium: 142 mmol/L (ref 135–145)

## 2022-03-28 LAB — MAGNESIUM: Magnesium: 2.5 mg/dL — ABNORMAL HIGH (ref 1.7–2.4)

## 2022-03-28 LAB — PHOSPHORUS: Phosphorus: 3.2 mg/dL (ref 2.5–4.6)

## 2022-03-28 MED ORDER — PHENOL 1.4 % MT LIQD
1.0000 | OROMUCOSAL | Status: DC | PRN
  Administered 2022-03-28 – 2022-03-30 (×2): 1 via OROMUCOSAL
  Filled 2022-03-28: qty 177

## 2022-03-28 MED ORDER — ACETAMINOPHEN 10 MG/ML IV SOLN
1000.0000 mg | Freq: Four times a day (QID) | INTRAVENOUS | Status: AC
Start: 2022-03-28 — End: 2022-03-29
  Administered 2022-03-28 – 2022-03-29 (×4): 1000 mg via INTRAVENOUS
  Filled 2022-03-28 (×4): qty 100

## 2022-03-28 NOTE — Progress Notes (Signed)
PROGRESS NOTE  Jenny Madden JGG:[id number] DOB: 1941-04-27   PCP: Hoyt Koch, MD  Patient is from: Home  DOA: 03/26/2022 LOS: 2  Chief complaints Chief Complaint  Patient presents with   Abdominal Pain     Brief Narrative / Interim history: 81 year old F with PMH of left RCC, DM-2, diastolic CHF, constipation, HTN, anxiety and allergic rhinitis presenting with abdominal pain radiating from epigastric area to RLQ, and vomiting and found to have small bowel obstruction for which she underwent ex lap with lysis of adhesion on 03/27/2021 by Dr. Donne Hazel.  There was also concern about UTI for which she was started on IV ceftriaxone.  Urine culture with 10,000 colonies of insignificant growth.  Subjective: Seen and examined earlier this morning.  No major events overnight of this morning.  Pain fairly controlled.  No complaints other than some mild dry cough likely from intubation.  She denies chest pain or shortness of breath.  She denies nausea or vomiting.  No flatus or bowel movement yet.  She denies UTI symptoms.  Objective: Vitals:   03/28/22 0128 03/28/22 0550 03/28/22 0922 03/28/22 1025  BP: 114/60 (!) 120/55 (!) 113/55 117/66  Pulse: 71 71 73 69  Resp: '16 20 15   '$ Temp: 98.2 F (36.8 C) 98.1 F (36.7 C) 98.6 F (37 C)   TempSrc:   Oral   SpO2: 93% 95% 93% 94%  Weight:      Height:        Examination:  GENERAL: No apparent distress.  Nontoxic. HEENT: MMM.  Vision and hearing grossly intact.  NECK: Supple.  No apparent JVD.  RESP:  No IWOB.  Fair aeration bilaterally. CVS:  RRR. Heart sounds normal.  ABD/GI/GU: BS-. Abd soft.  Mild discomfort but no significant tenderness.  Honeycomb dressing MSK/EXT:  Moves extremities. No apparent deformity. No edema.  SKIN: Dressing over laparotomy wound.  Slight blood staining NEURO: Awake, alert and oriented appropriately.  No apparent focal neuro deficit. PSYCH: Calm. Normal affect.   Procedures:  03/27/2022-ex  lap with lysis of adhesion by Dr. Donne Hazel  Microbiology summarized: Urine culture with 10,000 colonies of insignificant growth.  Assessment and plan: Principal Problem:   SBO (small bowel obstruction) (HCC) Active Problems:   Diabetes mellitus type 2 with complications (HCC)   Hyperlipidemia associated with type 2 diabetes mellitus ([place])   Essential hypertension   GERD   Constipation   Palpitations   Aortic atherosclerosis (HCC)   Chronic diastolic heart failure (HCC)   Acute UTI (urinary tract infection)   Small bowel obstruction: Presents with abdominal pain and vomiting. -s/p ex lap with lysis of adhesion on 03/27/2022 by Dr. Donne Hazel. -Analgesics, antiemetics -Mobilize.  PT/OT requested. -Continue PPI and IVF -Defer diet to the general surgery when bowel function returns.   Acute UTI?  UA with moderate LE, moderate Hgb, nitrites and rare bacteria.  Urine culture with insignificant growth. -Complete 3 days of IV ceftriaxone  Controlled NIDDM-2 with hyperglycemia and hyperlipidemia: A1c 6.0% in 09/2021.  Does not seem to be on medication at home.  CBG within acceptable range. -Check hemoglobin A1c -Continue atorvastatin once she started p.o.  Aortic atherosclerosis -Atorvastatin once she started taking p.o.   Essential hypertension: Normotensive off home antihypertensive meds.  On amlodipine at home. -Continue holding  Chronic diastolic CHF: TTE with LVEF of 65 to 70%, G1 DD, severe LAE.  Not on diuretics.  No cardiopulmonary symptoms. -Monitor fluid status   GERD -Continue IV Protonix   Palpitations:  Resolved.   Body mass index is 26.94 kg/m.           DVT prophylaxis:  enoxaparin (LOVENOX) injection 40 mg Start: 03/26/22 1400  Code Status: Full code Family Communication: Updated patient's daughter at bedside. Level of care: Telemetry Status is: Inpatient Remains inpatient appropriate because: Small bowel obstruction   Final disposition:  TBD Consultants:  General surgery  Sch Meds:  Scheduled Meds:  enoxaparin (LOVENOX) injection  40 mg Subcutaneous Q24H   metoprolol tartrate  5 mg Intravenous Q12H   pantoprazole (PROTONIX) IV  40 mg Intravenous Q24H   Continuous Infusions:  0.9 % NaCl with KCl 20 mEq / L 100 mL/hr at 03/28/22 1054   acetaminophen 1,000 mg (03/28/22 1031)   cefTRIAXone (ROCEPHIN)  IV 1 g (03/28/22 0804)   methocarbamol (ROBAXIN) IV     PRN Meds:.fentaNYL (SUBLIMAZE) injection, HYDROmorphone (DILAUDID) injection, methocarbamol (ROBAXIN) IV, mouth rinse, prochlorperazine  Antimicrobials: Anti-infectives (From admission, onward)    Start     Dose/Rate Route Frequency Ordered Stop   03/27/22 0800  cefTRIAXone (ROCEPHIN) 1 g in sodium chloride 0.9 % 100 mL IVPB        1 g 200 mL/hr over 30 Minutes Intravenous Every 24 hours 03/26/22 1319     03/26/22 0745  cefTRIAXone (ROCEPHIN) 1 g in sodium chloride 0.9 % 100 mL IVPB        1 g 200 mL/hr over 30 Minutes Intravenous  Once 03/26/22 0732 03/26/22 0935        I have personally reviewed the following labs and images: CBC: Recent Labs  Lab 03/26/22 0139 03/26/22 0820 03/27/22 0457 03/28/22 0528  WBC 10.8* 9.8 7.4 8.4  NEUTROABS 8.9*  --   --  6.7  HGB 12.6 13.3 13.0 11.8*  HCT 41.6 43.2 42.4 38.8  MCV 87.8 84.9 85.8 86.6  PLT 231 241 248 205   BMP &GFR Recent Labs  Lab 03/26/22 0139 03/26/22 0820 03/27/22 0457 03/28/22 0528  NA 140 138 142 141  142  K 4.1 3.7 4.1 4.6  4.6  CL 108 105 110 113*  114*  CO2 '22 23 23 22  '$ 21*  GLUCOSE 144* 152* 121* 127*  127*  BUN '13 11 14 22  22  '$ CREATININE 1.01* 0.88 0.88 0.97  0.91  CALCIUM 9.3 9.5 9.1 8.7*  8.8*  MG  --  2.5*  --  2.5*  PHOS  --   --   --  3.2   Estimated Creatinine Clearance: 40.8 mL/min (by C-G formula based on SCr of 0.97 mg/dL). Liver & Pancreas: Recent Labs  Lab 03/26/22 0139 03/27/22 0457 03/28/22 0528  AST '24 21 20  '$ ALT '13 12 10  '$ ALKPHOS 64 57 43   BILITOT 0.5 0.8 0.6  PROT 7.6 7.0 6.4*  ALBUMIN 4.2 3.6 3.4*   Recent Labs  Lab 03/26/22 0139  LIPASE 46   No results for input(s): "AMMONIA" in the last 168 hours. Diabetic: No results for input(s): "HGBA1C" in the last 72 hours. Recent Labs  Lab 03/27/22 1606 03/28/22 0007 03/28/22 0548 03/28/22 0737 03/28/22 1119  GLUCAP 131* 141* 114* 107* 90   Cardiac Enzymes: No results for input(s): "CKTOTAL", "CKMB", "CKMBINDEX", "TROPONINI" in the last 168 hours. No results for input(s): "PROBNP" in the last 8760 hours. Coagulation Profile: No results for input(s): "INR", "PROTIME" in the last 168 hours. Thyroid Function Tests: No results for input(s): "TSH", "T4TOTAL", "FREET4", "T3FREE", "THYROIDAB" in the last 72 hours. Lipid Profile: No  results for input(s): "CHOL", "HDL", "LDLCALC", "TRIG", "CHOLHDL", "LDLDIRECT" in the last 72 hours. Anemia Panel: No results for input(s): "VITAMINB12", "FOLATE", "FERRITIN", "TIBC", "IRON", "RETICCTPCT" in the last 72 hours. Urine analysis:    Component Value Date/Time   COLORURINE YELLOW 03/26/2022 0156   APPEARANCEUR CLEAR 03/26/2022 0156   LABSPEC 1.010 03/26/2022 0156   PHURINE 8.0 03/26/2022 0156   GLUCOSEU NEGATIVE 03/26/2022 0156   GLUCOSEU NEGATIVE 07/31/2012 1102   HGBUR MODERATE (A) 03/26/2022 0156   BILIRUBINUR NEGATIVE 03/26/2022 0156   KETONESUR NEGATIVE 03/26/2022 0156   PROTEINUR NEGATIVE 03/26/2022 0156   UROBILINOGEN 0.2 12/21/2018 1200   NITRITE POSITIVE (A) 03/26/2022 0156   LEUKOCYTESUR MODERATE (A) 03/26/2022 0156   Sepsis Labs: Invalid input(s): "PROCALCITONIN", "LACTICIDVEN"  Microbiology: Recent Results (from the past 240 hour(s))  Urine Culture     Status: Abnormal   Collection Time: 03/26/22  1:19 PM   Specimen: Urine, Clean Catch  Result Value Ref Range Status   Specimen Description   Final    URINE, CLEAN CATCH Performed at [facility], [place] [address] [place], [place]  [address]    Special Requests   Final    NONE Performed at [facility], [place] [address], [place], [place] [address]    Culture (A)  Final    <10,000 COLONIES/mL INSIGNIFICANT GROWTH Performed at [place] [address] [place],  [address]    Report Status 03/27/2022 FINAL  Final    Radiology Studies: No results found.    Iyad Deroo T. Turner  If 7PM-7AM, please contact night-coverage [email or web address] 03/28/2022, 11:59 AM

## 2022-03-28 NOTE — Progress Notes (Signed)
1 Day Post-Op  Subjective: CC: Patient reports some soreness around her incision.  No nausea.  NG tube in place with bilious output in canister, 650cc/24 hours.  No flatus or BM.  Has not been out of bed.  Voiding.  Seen at the same time as TRH.  Patient's daughter at bedside  Objective: Vital signs in last 24 hours: Temp:  [97.5 F (36.4 C)-99.1 F (37.3 C)] 98.6 F (37 C) (06/21 0922) Pulse Rate:  [68-85] 73 (06/21 0922) Resp:  [13-21] 15 (06/21 0922) BP: (107-129)/(55-71) 113/55 (06/21 0922) SpO2:  [93 %-99 %] 93 % (06/21 0922) Weight:  [66.8 kg] 66.8 kg (06/20 1238) Last BM Date : 03/25/22  Intake/Output from previous day: 06/20 0701 - 06/21 0700 In: 3524.2 [I.V.:2724.2; IV Piggyback:800] Out: 1700 [Urine:1050; Emesis/NG output:650] Intake/Output this shift: Total I/O In: -  Out: 950 [Urine:300; Emesis/NG output:650]  PE: Gen:  Alert, NAD, pleasant Pulm: Rate and effort normal Abd: Soft, mild distension, appropriately tender, midline wound with honeycomb dressing in place - cdi with staples below. Slightly hypoactive bowel sounds. NGT in place with bilous like fluid in cannister.  Psych: A&Ox3   Lab Results:  Recent Labs    03/27/22 0457 03/28/22 0528  WBC 7.4 8.4  HGB 13.0 11.8*  HCT 42.4 38.8  PLT 248 205   BMET Recent Labs    03/27/22 0457 03/28/22 0528  NA 142 141  142  K 4.1 4.6  4.6  CL 110 113*  114*  CO2 23 22  21*  GLUCOSE 121* 127*  127*  BUN '14 22  22  '$ CREATININE 0.88 0.97  0.91  CALCIUM 9.1 8.7*  8.8*   PT/INR No results for input(s): "LABPROT", "INR" in the last 72 hours. CMP     Component Value Date/Time   NA 141 03/28/2022 0528   NA 142 03/28/2022 0528   K 4.6 03/28/2022 0528   K 4.6 03/28/2022 0528   CL 113 (H) 03/28/2022 0528   CL 114 (H) 03/28/2022 0528   CO2 22 03/28/2022 0528   CO2 21 (L) 03/28/2022 0528   GLUCOSE 127 (H) 03/28/2022 0528   GLUCOSE 127 (H) 03/28/2022 0528   BUN 22 03/28/2022 0528   BUN 22  03/28/2022 0528   CREATININE 0.97 03/28/2022 0528   CREATININE 0.91 03/28/2022 0528   CREATININE 1.00 (H) 01/01/2017 0954   CALCIUM 8.7 (L) 03/28/2022 0528   CALCIUM 8.8 (L) 03/28/2022 0528   PROT 6.4 (L) 03/28/2022 0528   ALBUMIN 3.4 (L) 03/28/2022 0528   AST 20 03/28/2022 0528   ALT 10 03/28/2022 0528   ALKPHOS 43 03/28/2022 0528   BILITOT 0.6 03/28/2022 0528   GFRNONAA 59 (L) 03/28/2022 0528   GFRNONAA >60 03/28/2022 0528   GFRNONAA 55 (L) 01/01/2017 0954   GFRAA 54 (L) 12/29/2019 2312   GFRAA 63 01/01/2017 0954   Lipase     Component Value Date/Time   LIPASE 46 03/26/2022 0139    Studies/Results: DG Abd Portable 1V  Result Date: 03/27/2022 CLINICAL DATA:  Female at age 77 presenting for evaluation of suspected small-bowel obstruction. EXAM: PORTABLE ABDOMEN - 1 VIEW COMPARISON:  March 26, 2022. FINDINGS: Gastric tube tip in the distal stomach. Side port well below GE junction. Retained contrast within the gastric fundus. Dilute contrast throughout small bowel loops which remain dilated in the RIGHT hemiabdomen. No signs of colonic contrast at this time. Bowel loops appear more distended than on the CT and the most recent  abdominal radiograph. IMPRESSION: Gastric tube remains in place. Despite this there is increasing distension of small bowel loops over a series of prior studies. Concentrated contrast remaining in the stomach in the gastric fundus. Dilute contrast within dilated small bowel loops in the RIGHT hemiabdomen, compatible with small bowel obstruction. Electronically Signed   By: Zetta Bills M.D.   On: 03/27/2022 08:55   DG Abd Portable 1V-Small Bowel Obstruction Protocol-initial, 8 hr delay  Result Date: 03/26/2022 CLINICAL DATA:  8 hour post contrast small bowel protocol EXAM: PORTABLE ABDOMEN - 1 VIEW COMPARISON:  03/26/2022 radiograph and CT FINDINGS: Moderate enteral contrast within the stomach. Esophageal tube tip overlies the distal stomach. Dilute contrast  faintly visible within small bowel. No definitive colon or rectal contrast is present. There is contrast within the urinary bladder. IMPRESSION: Contrast bolus in the proximal stomach. Small amount of dilute contrast within small bowel but no definitive colon or rectal contrast is evident. Electronically Signed   By: Donavan Foil M.D.   On: 03/26/2022 21:19   ECHOCARDIOGRAM COMPLETE  Result Date: 03/26/2022    ECHOCARDIOGRAM REPORT   Patient Name:   Jenny Madden Date of Exam: 03/26/2022 Medical Rec #:  [id number]         Height:       62.0 in Accession #:    [id number]        Weight:       145.5 lb Date of Birth:  03-08-1941         BSA:          1.670 m Patient Age:    81 years          BP:           152/76 mmHg Patient Gender: F                 HR:           90 bpm. Exam Location:  Inpatient Procedure: 2D Echo, Color Doppler and Cardiac Doppler Indications:    Diastolic dysfunction  History:        Patient has prior history of Echocardiogram examinations, most                 recent 02/17/2020. Chronic diastolic heart failure,                 Signs/Symptoms:Murmur; Risk Factors:Hypertension and Diabetes.  Sonographer:    Joette Catching Referring Phys: [phone] [place]  Sonographer Comments: Technically difficult study due to poor echo windows. IMPRESSIONS  1. Left ventricular ejection fraction, by estimation, is 65 to 70%. The left ventricle has hyperdynamic function. The left ventricle has no regional wall motion abnormalities. Left ventricular diastolic parameters are consistent with Grade I diastolic dysfunction (impaired relaxation). There is LV midcavitary obliteration in systole.  2. Right ventricular systolic function is normal. The right ventricular size is normal.  3. Left atrial size was severely dilated.  4. The mitral valve is normal in structure. No evidence of mitral valve regurgitation. No evidence of mitral stenosis. Moderate mitral annular calcification.  5. The aortic valve is  normal in structure. There is mild calcification of the aortic valve. Aortic valve regurgitation is not visualized. Aortic valve sclerosis/calcification is present, without any evidence of aortic stenosis. Aortic valve area, by VTI measures 2.92 cm. Aortic valve mean gradient measures 12.0 mmHg. Aortic valve Vmax measures 2.47 m/s.  6. The inferior vena cava is normal in size with greater than 50% respiratory  variability, suggesting right atrial pressure of 3 mmHg. FINDINGS  Left Ventricle: Left ventricular ejection fraction, by estimation, is 65 to 70%. The left ventricle has hyperdynamic function. The left ventricle has no regional wall motion abnormalities. The left ventricular internal cavity size was normal in size. There is no left ventricular hypertrophy. Left ventricular diastolic parameters are consistent with Grade I diastolic dysfunction (impaired relaxation). Right Ventricle: The right ventricular size is normal. No increase in right ventricular wall thickness. Right ventricular systolic function is normal. Left Atrium: Left atrial size was severely dilated. Right Atrium: Right atrial size was normal in size. Pericardium: There is no evidence of pericardial effusion. Mitral Valve: The mitral valve is normal in structure. Moderate mitral annular calcification. No evidence of mitral valve regurgitation. No evidence of mitral valve stenosis. Tricuspid Valve: The tricuspid valve is normal in structure. Tricuspid valve regurgitation is trivial. No evidence of tricuspid stenosis. Aortic Valve: The aortic valve is normal in structure. There is mild calcification of the aortic valve. Aortic valve regurgitation is not visualized. Aortic valve sclerosis/calcification is present, without any evidence of aortic stenosis. Aortic valve mean gradient measures 12.0 mmHg. Aortic valve peak gradient measures 24.4 mmHg. Aortic valve area, by VTI measures 2.92 cm. Pulmonic Valve: The pulmonic valve was normal in  structure. Pulmonic valve regurgitation is not visualized. No evidence of pulmonic stenosis. Aorta: The aortic root is normal in size and structure. Venous: The inferior vena cava is normal in size with greater than 50% respiratory variability, suggesting right atrial pressure of 3 mmHg. IAS/Shunts: No atrial level shunt detected by color flow Doppler.  LEFT VENTRICLE PLAX 2D LVIDd:         3.40 cm   Diastology LVIDs:         2.10 cm   LV e' medial:    6.09 cm/s LV PW:         0.70 cm   LV E/e' medial:  19.0 LV IVS:        1.90 cm   LV e' lateral:   5.22 cm/s LVOT diam:     1.90 cm   LV E/e' lateral: 22.2 LV SV:         92 LV SV Index:   55 LVOT Area:     2.84 cm  RIGHT VENTRICLE             IVC RV Basal diam:  3.00 cm     IVC diam: 1.20 cm RV Mid diam:    2.70 cm RV S prime:     34.40 cm/s TAPSE (M-mode): 3.5 cm LEFT ATRIUM             Index        RIGHT ATRIUM           Index LA diam:        2.90 cm 1.74 cm/m   RA Area:     10.30 cm LA Vol (A2C):   95.1 ml 56.95 ml/m  RA Volume:   18.50 ml  11.08 ml/m LA Vol (A4C):   62.1 ml 37.19 ml/m LA Biplane Vol: 77.2 ml 46.23 ml/m  AORTIC VALVE                     PULMONIC VALVE AV Area (Vmax):    2.39 cm      PV Vmax:          1.94 m/s AV Area (Vmean):   2.45 cm  PV Peak grad:     15.1 mmHg AV Area (VTI):     2.92 cm      PR End Diast Vel: 9.24 msec AV Vmax:           247.00 cm/s AV Vmean:          154.000 cm/s AV VTI:            0.314 m AV Peak Grad:      24.4 mmHg AV Mean Grad:      12.0 mmHg LVOT Vmax:         208.00 cm/s LVOT Vmean:        133.000 cm/s LVOT VTI:          0.323 m LVOT/AV VTI ratio: 1.03  AORTA Ao Root diam: 2.90 cm Ao Asc diam:  3.50 cm MITRAL VALVE                TRICUSPID VALVE MV Area (PHT): 3.19 cm     TR Peak grad:   40.2 mmHg MV Decel Time: 238 msec     TR Vmax:        317.00 cm/s MV E velocity: 116.00 cm/s MV A velocity: 213.00 cm/s  SHUNTS MV E/A ratio:  0.54         Systemic VTI:  0.32 m                             Systemic Diam:  1.90 cm Glori Bickers MD Electronically signed by Glori Bickers MD Signature Date/Time: 03/26/2022/4:19:00 PM    Final     Anti-infectives: Anti-infectives (From admission, onward)    Start     Dose/Rate Route Frequency Ordered Stop   03/27/22 0800  cefTRIAXone (ROCEPHIN) 1 g in sodium chloride 0.9 % 100 mL IVPB        1 g 200 mL/hr over 30 Minutes Intravenous Every 24 hours 03/26/22 1319     03/26/22 0745  cefTRIAXone (ROCEPHIN) 1 g in sodium chloride 0.9 % 100 mL IVPB        1 g 200 mL/hr over 30 Minutes Intravenous  Once 03/26/22 0732 03/26/22 0935        Assessment/Plan POD 1 s/p exploratory laparotomy with lysis of adhesions for SBO by Dr. Donne Hazel on 03/27/22 - AROBF. Cont NGT - Mobilize, PT - Pulm toilet   FEN - NPO/NGT/IVFs VTE - Lovenox ID - Rocephin for UTI   UTI Chronic diastolic HF CKD HTN DM   LOS: 2 days    Jillyn Ledger , [facility] 03/28/2022, 10:19 AM Please see Amion for pager number during day hours 7:00am-4:30pm

## 2022-03-28 NOTE — Plan of Care (Signed)

## 2022-03-28 NOTE — Anesthesia Postprocedure Evaluation (Signed)
Anesthesia Post Note  Patient: Jenny Madden  Procedure(s) Performed: EXPLORATORY LAPAROTOMY, POSSIBLE SMALL BOWEL RESECTION     Patient location during evaluation: Other Anesthesia Type: General Level of consciousness: awake and alert Pain management: pain level controlled Vital Signs Assessment: post-procedure vital signs reviewed and stable Respiratory status: spontaneous breathing, nonlabored ventilation, respiratory function stable and patient connected to nasal cannula oxygen Cardiovascular status: blood pressure returned to baseline and stable Postop Assessment: no apparent nausea or vomiting Anesthetic complications: no   No notable events documented.  Last Vitals:  Vitals:   03/28/22 1025 03/28/22 1321  BP: 117/66 (!) 134/57  Pulse: 69 68  Resp:  16  Temp:  36.8 C  SpO2: 94% 93%    Last Pain:  Vitals:   03/28/22 0922  TempSrc: Oral  PainSc:                  [name],W. EDMOND

## 2022-03-28 NOTE — Evaluation (Signed)
Physical Therapy Evaluation Patient Details Name: Jenny Madden MRN: [id number] DOB: 30-Apr-1941 Today's Date: 03/28/2022  History of Present Illness  Jenny Madden is a 81 y.o. female  presents with abdominal pain. Pt s/p exlap with lysis of adhesions for SBO on 6/20. PMH: allergy rhinitis, anxiety, benign neoplasm of colon, left renal cancer, cataracts, CHF, constipation, diverticulosis, dysphagia, heart murmur, HTN, osteoporosis, hyperlipidemia, type 2 diabetes, unspecified tinnitus, vitamin D deficiency  Clinical Impression  Pt admitted with above diagnosis. Pt ind at baseline, not using AD, denies falls, drives. Pt currently requires min A for log rolling, cued for sequencing and avoiding breath holding. Min guard to power to stand and pivot to recliner, able to take steps at bedside, limited by NG suction to wall. Pt up in chair, excited to ambulate, RN waiting on clarification on clamping NG suction, aware pt up in recliner. Plan to continue progressing pt back to baseline to return home with family support. DME needs TBD. Pt currently with functional limitations due to the deficits listed below (see PT Problem List). Pt will benefit from skilled PT to increase their independence and safety with mobility to allow discharge to the venue listed below.          Recommendations for follow up therapy are one component of a multi-disciplinary discharge planning process, led by the attending physician.  Recommendations may be updated based on patient status, additional functional criteria and insurance authorization.  Follow Up Recommendations No PT follow up      Assistance Recommended at Discharge PRN  Patient can return home with the following  Assist for transportation;Help with stairs or ramp for entrance;Assistance with cooking/housework    Equipment Recommendations Other (comment) (TBD)  Recommendations for Other Services       Functional Status Assessment Patient has had a  recent decline in their functional status and demonstrates the ability to make significant improvements in function in a reasonable and predictable amount of time.     Precautions / Restrictions Precautions Precautions: Other (comment) Precaution Comments: NG tube Restrictions Weight Bearing Restrictions: No      Mobility  Bed Mobility Overal bed mobility: Needs Assistance Bed Mobility: Rolling, Sidelying to Sit Rolling: Min guard Sidelying to sit: Min assist  General bed mobility comments: VC for log roll technique, hand placement, sequencing and avoiding breath tholding wiht mobility, min A to upright trunk into sitting    Transfers Overall transfer level: Needs assistance Equipment used: None Transfers: Sit to/from Stand, Bed to chair/wheelchair/BSC Sit to Stand: Min guard  General transfer comment: slow to power to stand, no physical assistance, min guard for safety    Ambulation/Gait  General Gait Details: pt able to take steps at bedside, limited by NG to suction  Stairs            Wheelchair Mobility    Modified Rankin (Stroke Patients Only)       Balance Overall balance assessment: No apparent balance deficits (not formally assessed)       Pertinent Vitals/Pain Pain Assessment Pain Assessment: No/denies pain    Home Living Family/patient expects to be discharged to:: Private residence Living Arrangements: Alone Available Help at Discharge: Family;Available PRN/intermittently Type of Home: House Home Access: Stairs to enter Entrance Stairs-Rails: Psychiatric nurse of Steps: 3 on front, 2 on back   Home Layout: One level Home Equipment: None      Prior Function Prior Level of Function : Independent/Modified Independent;Driving      Hand Dominance  Extremity/Trunk Assessment   Upper Extremity Assessment Upper Extremity Assessment: Overall WFL for tasks assessed    Lower Extremity Assessment Lower Extremity  Assessment: Overall WFL for tasks assessed (symmetrical, AROM WNL, strength grossly 3+/5, denies numbness/tingling)    Cervical / Trunk Assessment Cervical / Trunk Assessment: Normal  Communication   Communication: No difficulties  Cognition Arousal/Alertness: Awake/alert Behavior During Therapy: WFL for tasks assessed/performed Overall Cognitive Status: Within Functional Limits for tasks assessed     General Comments      Exercises     Assessment/Plan    PT Assessment Patient needs continued PT services  PT Problem List Decreased strength;Decreased activity tolerance;Decreased balance;Decreased mobility;Decreased knowledge of use of DME;Decreased knowledge of precautions;Decreased skin integrity;Pain       PT Treatment Interventions DME instruction;Gait training;Stair training;Functional mobility training;Therapeutic activities;Therapeutic exercise;Balance training;Patient/family education    PT Goals (Current goals can be found in the Care Plan section)  Acute Rehab PT Goals Patient Stated Goal: return home PT Goal Formulation: With patient/family Time For Goal Achievement: 04/11/22 Potential to Achieve Goals: Good    Frequency Min 3X/week     Co-evaluation               AM-PAC PT "6 Clicks" Mobility  Outcome Measure Help needed turning from your back to your side while in a flat bed without using bedrails?: A Little Help needed moving from lying on your back to sitting on the side of a flat bed without using bedrails?: A Little Help needed moving to and from a bed to a chair (including a wheelchair)?: A Little Help needed standing up from a chair using your arms (e.g., wheelchair or bedside chair)?: A Little Help needed to walk in hospital room?: A Little Help needed climbing 3-5 steps with a railing? : A Lot 6 Click Score: 17    End of Session   Activity Tolerance: Patient tolerated treatment well Patient left: in chair;with call bell/phone within  reach;with family/visitor present Nurse Communication: Mobility status PT Visit Diagnosis: Other abnormalities of gait and mobility (R26.89);Pain Pain - part of body:  (abdominal)    Time: 1202-1220 PT Time Calculation (min) (ACUTE ONLY): 18 min   Charges:   PT Evaluation $PT Eval Low Complexity: 1 Low           Tori Poppi Scantling PT, DPT 03/28/22, 1:04 PM

## 2022-03-29 DIAGNOSIS — N390 Urinary tract infection, site not specified: Secondary | ICD-10-CM | POA: Diagnosis not present

## 2022-03-29 DIAGNOSIS — I700 Atherosclerosis of aorta: Secondary | ICD-10-CM | POA: Diagnosis not present

## 2022-03-29 DIAGNOSIS — K56609 Unspecified intestinal obstruction, unspecified as to partial versus complete obstruction: Secondary | ICD-10-CM | POA: Diagnosis not present

## 2022-03-29 DIAGNOSIS — I5032 Chronic diastolic (congestive) heart failure: Secondary | ICD-10-CM | POA: Diagnosis not present

## 2022-03-29 LAB — CBC
HCT: 39.8 % (ref 36.0–46.0)
Hemoglobin: 11.1 g/dL — ABNORMAL LOW (ref 12.0–15.0)
MCH: 26.6 pg (ref 26.0–34.0)
MCHC: 27.9 g/dL — ABNORMAL LOW (ref 30.0–36.0)
MCV: 95.2 fL (ref 80.0–100.0)
Platelets: 182 10*3/uL (ref 150–400)
RBC: 4.18 MIL/uL (ref 3.87–5.11)
RDW: 13.8 % (ref 11.5–15.5)
WBC: 9.2 10*3/uL (ref 4.0–10.5)
nRBC: 0 % (ref 0.0–0.2)

## 2022-03-29 LAB — GLUCOSE, CAPILLARY
Glucose-Capillary: 62 mg/dL — ABNORMAL LOW (ref 70–99)
Glucose-Capillary: 104 mg/dL — ABNORMAL HIGH (ref 70–99)
Glucose-Capillary: 70 mg/dL (ref 70–99)
Glucose-Capillary: 93 mg/dL (ref 70–99)

## 2022-03-29 LAB — COMPREHENSIVE METABOLIC PANEL
ALT: 11 U/L (ref 0–44)
AST: 20 U/L (ref 15–41)
Albumin: 3.2 g/dL — ABNORMAL LOW (ref 3.5–5.0)
Alkaline Phosphatase: 46 U/L (ref 38–126)
Anion gap: 7 (ref 5–15)
BUN: 15 mg/dL (ref 8–23)
CO2: 21 mmol/L — ABNORMAL LOW (ref 22–32)
Calcium: 8.7 mg/dL — ABNORMAL LOW (ref 8.9–10.3)
Chloride: 114 mmol/L — ABNORMAL HIGH (ref 98–111)
Creatinine, Ser: 0.8 mg/dL (ref 0.44–1.00)
GFR, Estimated: >60 mL/min (ref >60)
Glucose, Bld: 65 mg/dL — ABNORMAL LOW (ref 70–99)
Potassium: 4 mmol/L (ref 3.5–5.1)
Sodium: 142 mmol/L (ref 135–145)
Total Bilirubin: 0.7 mg/dL (ref 0.3–1.2)
Total Protein: 6.1 g/dL — ABNORMAL LOW (ref 6.5–8.1)

## 2022-03-29 LAB — MAGNESIUM: Magnesium: 2.4 mg/dL (ref 1.7–2.4)

## 2022-03-29 LAB — PHOSPHORUS: Phosphorus: 1.7 mg/dL — ABNORMAL LOW (ref 2.5–4.6)

## 2022-03-29 MED ORDER — KCL IN DEXTROSE-NACL 20-5-0.9 MEQ/L-%-% IV SOLN
INTRAVENOUS | Status: DC
  Filled 2022-03-29 (×10): qty 1000

## 2022-03-29 MED ORDER — DEXTROSE 50 % IV SOLN
12.5000 g | INTRAVENOUS | Status: AC
Start: 2022-03-29 — End: 2022-03-29
  Administered 2022-03-29: 12.5 g via INTRAVENOUS
  Filled 2022-03-29: qty 50

## 2022-03-29 MED ORDER — HYDRALAZINE HCL 20 MG/ML IJ SOLN: 5.0000 mg | INTRAMUSCULAR | Status: DC | PRN

## 2022-03-29 MED ORDER — METOPROLOL TARTRATE 5 MG/5ML IV SOLN: 2.5000 mg | INTRAVENOUS | Status: DC | PRN

## 2022-03-29 NOTE — Progress Notes (Signed)
Physical Therapy Treatment Patient Details Name: Jenny Madden MRN: [id number] DOB: [birth year] Today's Date: 03/29/2022   History of Present Illness Jenny Madden is a 81 y.o. female  presents with abdominal pain. Pt s/p exlap with lysis of adhesions for SBO on 6/20. PMH: allergy rhinitis, anxiety, benign neoplasm of colon, left renal cancer, cataracts, CHF, constipation, diverticulosis, dysphagia, heart murmur, HTN, osteoporosis, hyperlipidemia, type 2 diabetes, unspecified tinnitus, vitamin D deficiency    PT Comments    Patient  motivated, ambulated x 140' pushing IV pole. Continue PT.    Recommendations for follow up therapy are one component of a multi-disciplinary discharge planning process, led by the attending physician.  Recommendations may be updated based on patient status, additional functional criteria and insurance authorization.  Follow Up Recommendations  No PT follow up     Assistance Recommended at Discharge PRN  Patient can return home with the following Assist for transportation;Help with stairs or ramp for entrance;Assistance with cooking/housework   Equipment Recommendations       Recommendations for Other Services       Precautions / Restrictions Precautions Precaution Comments: NG tube     Mobility  Bed Mobility     Rolling: Min guard Sidelying to sit: Min guard       General bed mobility comments: pt does not want assistance    Transfers Overall transfer level: Needs assistance   Transfers: Sit to/from Stand Sit to Stand: Min guard           General transfer comment: slow to rise, supports on bedrail and IV pole    Ambulation/Gait Ambulation/Gait assistance: Min guard Gait Distance (Feet): 140 Feet Assistive device: IV Pole Gait Pattern/deviations: Step-through pattern       General Gait Details: slow speed, guarded   Stairs             Wheelchair Mobility    Modified Rankin (Stroke Patients Only)        Balance Overall balance assessment: Mild deficits observed, not formally tested                                          Cognition Arousal/Alertness: Awake/alert                                              Exercises      General Comments General comments (skin integrity, edema, etc.): relies on IV pole      Pertinent Vitals/Pain Pain Assessment Pain Assessment: No/denies pain    Home Living                          Prior Function            PT Goals (current goals can now be found in the care plan section) Progress towards PT goals: Progressing toward goals    Frequency    Min 3X/week      PT Plan Current plan remains appropriate    Co-evaluation              AM-PAC PT "6 Clicks" Mobility   Outcome Measure  Help needed turning from your back to your side while in a flat bed without using bedrails?: A Little Help needed moving  from lying on your back to sitting on the side of a flat bed without using bedrails?: A Little Help needed moving to and from a bed to a chair (including a wheelchair)?: A Little Help needed standing up from a chair using your arms (e.g., wheelchair or bedside chair)?: A Little Help needed to walk in hospital room?: A Little Help needed climbing 3-5 steps with a railing? : A Lot 6 Click Score: 17    End of Session   Activity Tolerance: Patient tolerated treatment well Patient left: in bed;with call bell/phone within reach;with family/visitor present;with bed alarm set Nurse Communication: Mobility status PT Visit Diagnosis: Other abnormalities of gait and mobility (R26.89);Pain     Time: 9528-4132 PT Time Calculation (min) (ACUTE ONLY): 18 min  Charges:  $Gait Training: 8-22 mins                     [place] Office [phone] Weekend [phone]    Claretha Cooper 03/29/2022, 3:07 PM

## 2022-03-29 NOTE — Progress Notes (Signed)
2 Days Post-Op  Subjective: CC: Doing well. Less abdominal pain. Still some soreness around incision. No nausea. NGT output 750cc/24 hours. No flatus or bm. About to walk with therapies. Voiding.   Objective: Vital signs in last 24 hours: Temp:  [98.1 F (36.7 C)-99 F (37.2 C)] 99 F (37.2 C) (06/22 0339) Pulse Rate:  [68-78] 77 (06/22 0339) Resp:  [15-18] 18 (06/22 0339) BP: (113-147)/(55-67) 147/64 (06/22 0339) SpO2:  [93 %-95 %] 95 % (06/22 0339) Last BM Date : 03/25/22  Intake/Output from previous day: 06/21 0701 - 06/22 0700 In: 2664.1 [I.V.:2268.7; IV Piggyback:395.4] Out: 2050 [Urine:1300; Emesis/NG output:750] Intake/Output this shift: Total I/O In: -  Out: 300 [Urine:300]  PE: Gen:  Alert, NAD, pleasant Pulm: Rate and effort normal Abd: Soft, mild distension, appropriately tender, midline wound with honeycomb dressing in place - cdi with staples below. Slightly hypoactive bowel sounds. NGT in place with small amount of bilous like fluid in cannister Psych: A&Ox3   Lab Results:  Recent Labs    03/28/22 0528 03/29/22 0509  WBC 8.4 9.2  HGB 11.8* 11.1*  HCT 38.8 39.8  PLT 205 182   BMET Recent Labs    03/28/22 0528 03/29/22 0509  NA 141  142 142  K 4.6  4.6 4.0  CL 113*  114* 114*  CO2 22  21* 21*  GLUCOSE 127*  127* 65*  BUN '22  22 15  '$ CREATININE 0.97  0.91 0.80  CALCIUM 8.7*  8.8* 8.7*   PT/INR No results for input(s): "LABPROT", "INR" in the last 72 hours. CMP     Component Value Date/Time   NA 142 03/29/2022 0509   K 4.0 03/29/2022 0509   CL 114 (H) 03/29/2022 0509   CO2 21 (L) 03/29/2022 0509   GLUCOSE 65 (L) 03/29/2022 0509   BUN 15 03/29/2022 0509   CREATININE 0.80 03/29/2022 0509   CREATININE 1.00 (H) 01/01/2017 0954   CALCIUM 8.7 (L) 03/29/2022 0509   PROT 6.1 (L) 03/29/2022 0509   ALBUMIN 3.2 (L) 03/29/2022 0509   AST 20 03/29/2022 0509   ALT 11 03/29/2022 0509   ALKPHOS 46 03/29/2022 0509   BILITOT 0.7  03/29/2022 0509   GFRNONAA >60 03/29/2022 0509   GFRNONAA 55 (L) 01/01/2017 0954   GFRAA 54 (L) 12/29/2019 2312   GFRAA 63 01/01/2017 0954   Lipase     Component Value Date/Time   LIPASE 46 03/26/2022 0139    Studies/Results: DG Abd 1 View  Result Date: 03/28/2022 CLINICAL DATA:  Enteric tube placement. EXAM: ABDOMEN - 1 VIEW COMPARISON:  Abdominal x-ray from yesterday. FINDINGS: Unchanged enteric tube in the stomach. Oral contrast within the ascending colon. Continued small bowel dilatation. IMPRESSION: 1. Unchanged enteric tube in the stomach. 2. Oral contrast now in the colon. In the setting of continued small bowel dilatation, findings are suggestive of partial small bowel obstruction. Electronically Signed   By: Titus Dubin M.D.   On: 03/28/2022 19:21    Anti-infectives: Anti-infectives (From admission, onward)    Start     Dose/Rate Route Frequency Ordered Stop   03/27/22 0800  cefTRIAXone (ROCEPHIN) 1 g in sodium chloride 0.9 % 100 mL IVPB        1 g 200 mL/hr over 30 Minutes Intravenous Every 24 hours 03/26/22 1319 03/28/22 0834   03/26/22 0745  cefTRIAXone (ROCEPHIN) 1 g in sodium chloride 0.9 % 100 mL IVPB        1 g 200 mL/hr  over 30 Minutes Intravenous  Once 03/26/22 0732 03/26/22 0935        Assessment/Plan POD 2 s/p exploratory laparotomy with lysis of adhesions for SBO by Dr. Donne Hazel on 03/27/22 - AROBF. Cont NGT - Mobilize, PT - Pulm toilet   FEN - NPO/NGT/IVFs VTE - Lovenox ID - Rocephin for UTI   UTI Chronic diastolic HF CKD HTN DM   LOS: 3 days    Jillyn Ledger , [facility] 03/29/2022, 9:12 AM Please see Amion for pager number during day hours 7:00am-4:30pm

## 2022-03-29 NOTE — Progress Notes (Signed)
PROGRESS NOTE  Jenny Madden:[id number] DOB: April 24, 1941   PCP: Hoyt Koch, MD  Patient is from: Home.  DOA: 03/26/2022 LOS: 3  Chief complaints Chief Complaint  Patient presents with   Abdominal Pain     Brief Narrative / Interim history: 81 year old F with PMH of left RCC, DM-2, diastolic CHF, constipation, HTN, anxiety and allergic rhinitis presenting with abdominal pain radiating from epigastric area to RLQ, and vomiting and found to have small bowel obstruction for which she underwent ex lap with lysis of adhesion on 03/27/2021 by Dr. Donne Hazel.  There was also concern about UTI for which she was started on IV ceftriaxone.  Urine culture with 10,000 colonies of insignificant growth.  Subjective: Seen and examined earlier this morning.  No major events overnight of this morning.  Feels hungry but has not had bowel movement or flatus yet.  Denies pain, nausea or vomiting.  Objective: Vitals:   03/28/22 1714 03/28/22 2139 03/29/22 0339 03/29/22 1247  BP: 135/67 (!) 129/55 (!) 147/64 (!) 135/54  Pulse: 72 78 77 80  Resp: '17 18 18 20  '$ Temp: 98.1 F (36.7 C) 98.3 F (36.8 C) 99 F (37.2 C) 98.5 F (36.9 C)  TempSrc: Oral Oral Oral Oral  SpO2: 93% 95% 95% 100%  Weight:      Height:        Examination:  GENERAL: No apparent distress.  Nontoxic. HEENT: MMM.  Vision and hearing grossly intact.  NECK: Supple.  No apparent JVD.  RESP:  No IWOB.  Fair aeration bilaterally. CVS:  RRR. Heart sounds normal.  ABD/GI/GU: Diminished BS.  Abd soft, NTND.  Honeycomb dressing in place. MSK/EXT:  Moves extremities. No apparent deformity. No edema.  SKIN: Honeycomb dressing in place. NEURO: Awake, alert and oriented appropriately.  No apparent focal neuro deficit. PSYCH: Calm. Normal affect.   Procedures:  03/27/2022-ex lap with lysis of adhesion by Dr. Donne Hazel  Microbiology summarized: Urine culture with 10,000 colonies of insignificant growth  Assessment and  plan: Principal Problem:   SBO (small bowel obstruction) ([place]) Active Problems:   Diabetes mellitus type 2 with complications ([place])   Hyperlipidemia associated with type 2 diabetes mellitus ([place])   Essential hypertension   GERD   Constipation   Palpitations   Aortic atherosclerosis ([facility])   Chronic diastolic heart failure ([facility])   Acute UTI (urinary tract infection)  Small bowel obstruction: abdominal pain and vomiting on presentation.  No flatus or BM yet.  -s/p ex lap with lysis of adhesion on 03/27/2022 by Dr. Donne Hazel. -Analgesics, antiemetics and IV fluid -N.p.o. pending return of bowel function. -Mobilize! -Continue PPI. -Defer diet to the general surgery when bowel function returns.   Acute UTI?  UA with moderate LE, moderate Hgb, nitrites and rare bacteria.  Urine culture with insignificant growth. -Completed 3 days of IV ceftriaxone   Controlled NIDDM-2 with hyperglycemia and hyperlipidemia: A1c 6.0% in 09/2021.  Does not seem to be on medication at home.  CBG within acceptable range. -Check hemoglobin A1c -Continue atorvastatin once she started p.o.   Aortic atherosclerosis -Atorvastatin once she started taking p.o.   Essential hypertension: Normotensive off home antihypertensive meds.  On amlodipine at home. -Continue holding   Chronic diastolic CHF: TTE with LVEF of 65 to 70%, G1 DD, severe LAE.  Not on diuretics.  No cardiopulmonary symptoms. -Monitor fluid status   GERD -Continue IV Protonix   Palpitations: Resolved.    Body mass index is 26.94 kg/m.  DVT prophylaxis:  enoxaparin (LOVENOX) injection 40 mg Start: 03/26/22 1400  Code Status: Full code Family Communication: None at bedside Level of care: Telemetry Status is: Inpatient Remains inpatient appropriate because: Small bowel obstruction   Final disposition: Home once medically cleared. Consultants:  General surgery  Sch Meds:  Scheduled Meds:  enoxaparin (LOVENOX)  injection  40 mg Subcutaneous Q24H   pantoprazole (PROTONIX) IV  40 mg Intravenous Q24H   Continuous Infusions:  dextrose 5 % and 0.9 % NaCl with KCl 20 mEq/L 100 mL/hr at 03/29/22 1125   methocarbamol (ROBAXIN) IV     PRN Meds:.fentaNYL (SUBLIMAZE) injection, hydrALAZINE, HYDROmorphone (DILAUDID) injection, methocarbamol (ROBAXIN) IV, metoprolol tartrate, mouth rinse, phenol, prochlorperazine  Antimicrobials: Anti-infectives (From admission, onward)    Start     Dose/Rate Route Frequency Ordered Stop   03/27/22 0800  cefTRIAXone (ROCEPHIN) 1 g in sodium chloride 0.9 % 100 mL IVPB        1 g 200 mL/hr over 30 Minutes Intravenous Every 24 hours 03/26/22 1319 03/28/22 0834   03/26/22 0745  cefTRIAXone (ROCEPHIN) 1 g in sodium chloride 0.9 % 100 mL IVPB        1 g 200 mL/hr over 30 Minutes Intravenous  Once 03/26/22 0732 03/26/22 0935        I have personally reviewed the following labs and images: CBC: Recent Labs  Lab 03/26/22 0139 03/26/22 0820 03/27/22 0457 03/28/22 0528 03/29/22 0509  WBC 10.8* 9.8 7.4 8.4 9.2  NEUTROABS 8.9*  --   --  6.7  --   HGB 12.6 13.3 13.0 11.8* 11.1*  HCT 41.6 43.2 42.4 38.8 39.8  MCV 87.8 84.9 85.8 86.6 95.2  PLT 231 241 248 205 182   BMP &GFR Recent Labs  Lab 03/26/22 0139 03/26/22 0820 03/27/22 0457 03/28/22 0528 03/29/22 0509  NA 140 138 142 141  142 142  K 4.1 3.7 4.1 4.6  4.6 4.0  CL 108 105 110 113*  114* 114*  CO2 '22 23 23 22  '$ 21* 21*  GLUCOSE 144* 152* 121* 127*  127* 65*  BUN '13 11 14 22  22 15  '$ CREATININE 1.01* 0.88 0.88 0.97  0.91 0.80  CALCIUM 9.3 9.5 9.1 8.7*  8.8* 8.7*  MG  --  2.5*  --  2.5* 2.4  PHOS  --   --   --  3.2 1.7*   Estimated Creatinine Clearance: 49.5 mL/min (by C-G formula based on SCr of 0.8 mg/dL). Liver & Pancreas: Recent Labs  Lab 03/26/22 0139 03/27/22 0457 03/28/22 0528 03/29/22 0509  AST '24 21 20 20  '$ ALT '13 12 10 11  '$ ALKPHOS 64 57 43 46  BILITOT 0.5 0.8 0.6 0.7  PROT 7.6 7.0  6.4* 6.1*  ALBUMIN 4.2 3.6 3.4* 3.2*   Recent Labs  Lab 03/26/22 0139  LIPASE 46   No results for input(s): "AMMONIA" in the last 168 hours. Diabetic: No results for input(s): "HGBA1C" in the last 72 hours. Recent Labs  Lab 03/28/22 1647 03/28/22 2136 03/29/22 0611 03/29/22 0647 03/29/22 1136  GLUCAP 83 75 62* 104* 70   Cardiac Enzymes: No results for input(s): "CKTOTAL", "CKMB", "CKMBINDEX", "TROPONINI" in the last 168 hours. No results for input(s): "PROBNP" in the last 8760 hours. Coagulation Profile: No results for input(s): "INR", "PROTIME" in the last 168 hours. Thyroid Function Tests: No results for input(s): "TSH", "T4TOTAL", "FREET4", "T3FREE", "THYROIDAB" in the last 72 hours. Lipid Profile: No results for input(s): "CHOL", "HDL", "LDLCALC", "TRIG", "CHOLHDL", "  LDLDIRECT" in the last 72 hours. Anemia Panel: No results for input(s): "VITAMINB12", "FOLATE", "FERRITIN", "TIBC", "IRON", "RETICCTPCT" in the last 72 hours. Urine analysis:    Component Value Date/Time   COLORURINE YELLOW 03/26/2022 0156   APPEARANCEUR CLEAR 03/26/2022 0156   LABSPEC 1.010 03/26/2022 0156   PHURINE 8.0 03/26/2022 0156   GLUCOSEU NEGATIVE 03/26/2022 0156   GLUCOSEU NEGATIVE 07/31/2012 1102   HGBUR MODERATE (A) 03/26/2022 0156   BILIRUBINUR NEGATIVE 03/26/2022 0156   KETONESUR NEGATIVE 03/26/2022 0156   PROTEINUR NEGATIVE 03/26/2022 0156   UROBILINOGEN 0.2 12/21/2018 1200   NITRITE POSITIVE (A) 03/26/2022 0156   LEUKOCYTESUR MODERATE (A) 03/26/2022 0156   Sepsis Labs: Invalid input(s): "PROCALCITONIN", "LACTICIDVEN"  Microbiology: Recent Results (from the past 240 hour(s))  Urine Culture     Status: Abnormal   Collection Time: 03/26/22  1:19 PM   Specimen: Urine, Clean Catch  Result Value Ref Range Status   Specimen Description   Final    URINE, CLEAN CATCH Performed at [facility], [place] [address], [place], [place] [address]    Special Requests   Final     NONE Performed at [facility], [place] [address] [place], [place] [address]    Culture (A)  Final    <10,000 COLONIES/mL INSIGNIFICANT GROWTH Performed at [place] [address], [place], [place] [address]    Report Status 03/27/2022 FINAL  Final    Radiology Studies: DG Abd 1 View  Result Date: 03/28/2022 CLINICAL DATA:  Enteric tube placement. EXAM: ABDOMEN - 1 VIEW COMPARISON:  Abdominal x-ray from yesterday. FINDINGS: Unchanged enteric tube in the stomach. Oral contrast within the ascending colon. Continued small bowel dilatation. IMPRESSION: 1. Unchanged enteric tube in the stomach. 2. Oral contrast now in the colon. In the setting of continued small bowel dilatation, findings are suggestive of partial small bowel obstruction. Electronically Signed   By: Titus Dubin M.D.   On: 03/28/2022 19:21      Gulianna Hornsby T. [place]  If 7PM-7AM, please contact night-coverage [email or web address] 03/29/2022, 2:28 PM

## 2022-03-29 NOTE — TOC Progression Note (Signed)
Transition of Care [facility]) - Progression Note    Patient Details  Name: HASNA STEFANIK MRN: [id number] Date of Birth: 05/24/1941  [facility] CM/SW Contact  Leeroy Cha, RN Phone Number: 03/29/2022, 7:52 AM  Clinical Narrative:   chart reviewed no toc needs.  Plan is for home with self post op care.  Pod 1.   Expected Discharge Plan: Home/Self Care Barriers to Discharge: Continued Medical Work up  Expected Discharge Plan and Services Expected Discharge Plan: Home/Self Care   Discharge Planning Services: CM Consult   Living arrangements for the past 2 months: Single Family Home                                       Social Determinants of Health (SDOH) Interventions    Readmission Risk Interventions     No data to display

## 2022-03-29 NOTE — Evaluation (Signed)
Occupational Therapy Evaluation Patient Details Name: Jenny Madden MRN: [id number] DOB: Feb 07, 1941 Today's Date: 03/29/2022   History of Present Illness Jenny Madden is a 81 y.o. female  presents with abdominal pain. Pt s/p exlap with lysis of adhesions for SBO on 6/20. PMH: allergy rhinitis, anxiety, benign neoplasm of colon, left renal cancer, cataracts, CHF, constipation, diverticulosis, dysphagia, heart murmur, HTN, osteoporosis, hyperlipidemia, type 2 diabetes, unspecified tinnitus, vitamin D deficiency   Clinical Impression   This 81 yo female admitted and underwent above presents to acute OT with PLOF of being totally independent with basic ADLs, IADLs, and driving. Currently she is setup/s-min guard A for basic ADLs. She will continue to benefit form acute OT without need for follow up.     Recommendations for follow up therapy are one component of a multi-disciplinary discharge planning process, led by the attending physician.  Recommendations may be updated based on patient status, additional functional criteria and insurance authorization.   Follow Up Recommendations  No OT follow up    Assistance Recommended at Discharge Intermittent Supervision/Assistance  Patient can return home with the following Assistance with cooking/housework    Functional Status Assessment  Patient has had a recent decline in their functional status and demonstrates the ability to make significant improvements in function in a reasonable and predictable amount of time.  Equipment Recommendations  None recommended by OT       Precautions / Restrictions Precautions Precaution Comments: NG tube Restrictions Weight Bearing Restrictions: No      Mobility Bed Mobility               General bed mobility comments: up in recliner upon arrival--RN reported she did not have to A her    Transfers Overall transfer level: Needs assistance Equipment used: None Transfers: Sit to/from  Stand Sit to Stand: Min guard (close to S)                  Balance Overall balance assessment: No apparent balance deficits (not formally assessed) (reports just feels weak from not being able to eat)                                         ADL either performed or assessed with clinical judgement   ADL                                         General ADL Comments: overall at a min guard-S level     Vision Patient Visual Report: No change from baseline              Pertinent Vitals/Pain Pain Assessment Pain Assessment: No/denies pain     Hand Dominance Right   Extremity/Trunk Assessment Upper Extremity Assessment Upper Extremity Assessment: Overall WFL for tasks assessed           Communication Communication Communication: No difficulties   Cognition Arousal/Alertness: Awake/alert Behavior During Therapy: WFL for tasks assessed/performed Overall Cognitive Status: Within Functional Limits for tasks assessed                                                  Home  Living Family/patient expects to be discharged to:: Private residence Living Arrangements: Alone Available Help at Discharge: Family;Available PRN/intermittently (says dtr can stay with her a few days) Type of Home: House Home Access: Stairs to enter [employer] of Steps: 3 on front, 2 on back Entrance Stairs-Rails: Right;Left Home Layout: One level     Bathroom Shower/Tub: Occupational psychologist: Standard     Home Equipment: None          Prior Functioning/Environment Prior Level of Function : Independent/Modified Independent;Driving                        OT Problem List: Decreased strength      OT Treatment/Interventions: Self-care/ADL training;DME and/or AE instruction;Balance training    OT Goals(Current goals can be found in the care plan section) Acute Rehab OT Goals Patient Stated Goal:  to be able to eat soon OT Goal Formulation: With patient Time For Goal Achievement: 04/12/22 Potential to Achieve Goals: Good  OT Frequency: Min 2X/week       AM-PAC OT "6 Clicks" Daily Activity     Outcome Measure Help from another person eating meals?: None Help from another person taking care of personal grooming?: A Little Help from another person toileting, which includes using toliet, bedpan, or urinal?: A Little Help from another person bathing (including washing, rinsing, drying)?: A Little Help from another person to put on and taking off regular upper body clothing?: A Little Help from another person to put on and taking off regular lower body clothing?: A Little 6 Click Score: 19   End of Session Equipment Utilized During Treatment: Gait belt Nurse Communication: Mobility status  Activity Tolerance: Patient tolerated treatment well Patient left: in chair;with call bell/phone within reach  OT Visit Diagnosis: Unsteadiness on feet (R26.81);Muscle weakness (generalized) (M62.81)                Time: 6387-5643 OT Time Calculation (min): 39 min Charges:  OT General Charges $OT Visit: 1 Visit OT Evaluation $OT Eval Moderate Complexity: 1 Mod OT Treatments $Self Care/Home Management : 23-37 mins [place], OTR/L Acute Rehab Services [facility] [phone] Office [phone]    Almon Register 03/29/2022, 9:49 AM

## 2022-03-30 DIAGNOSIS — K56609 Unspecified intestinal obstruction, unspecified as to partial versus complete obstruction: Secondary | ICD-10-CM | POA: Diagnosis not present

## 2022-03-30 DIAGNOSIS — I700 Atherosclerosis of aorta: Secondary | ICD-10-CM | POA: Diagnosis not present

## 2022-03-30 DIAGNOSIS — I5032 Chronic diastolic (congestive) heart failure: Secondary | ICD-10-CM | POA: Diagnosis not present

## 2022-03-30 DIAGNOSIS — N390 Urinary tract infection, site not specified: Secondary | ICD-10-CM | POA: Diagnosis not present

## 2022-03-30 DIAGNOSIS — J029 Acute pharyngitis, unspecified: Secondary | ICD-10-CM

## 2022-03-30 LAB — CBC
HCT: 37.4 % (ref 36.0–46.0)
Hemoglobin: 11.5 g/dL — ABNORMAL LOW (ref 12.0–15.0)
MCH: 26.8 pg (ref 26.0–34.0)
MCHC: 30.7 g/dL (ref 30.0–36.0)
MCV: 87.2 fL (ref 80.0–100.0)
Platelets: 186 10*3/uL (ref 150–400)
RBC: 4.29 MIL/uL (ref 3.87–5.11)
RDW: 13.6 % (ref 11.5–15.5)
WBC: 8.8 10*3/uL (ref 4.0–10.5)
nRBC: 0 % (ref 0.0–0.2)

## 2022-03-30 LAB — COMPREHENSIVE METABOLIC PANEL
ALT: 11 U/L (ref 0–44)
AST: 20 U/L (ref 15–41)
Albumin: 3.1 g/dL — ABNORMAL LOW (ref 3.5–5.0)
Alkaline Phosphatase: 43 U/L (ref 38–126)
Anion gap: 6 (ref 5–15)
BUN: 8 mg/dL (ref 8–23)
CO2: 22 mmol/L (ref 22–32)
Calcium: 8.6 mg/dL — ABNORMAL LOW (ref 8.9–10.3)
Chloride: 113 mmol/L — ABNORMAL HIGH (ref 98–111)
Creatinine, Ser: 0.66 mg/dL (ref 0.44–1.00)
GFR, Estimated: >60 mL/min (ref >60)
Glucose, Bld: 134 mg/dL — ABNORMAL HIGH (ref 70–99)
Potassium: 3.7 mmol/L (ref 3.5–5.1)
Sodium: 141 mmol/L (ref 135–145)
Total Bilirubin: 0.6 mg/dL (ref 0.3–1.2)
Total Protein: 6.1 g/dL — ABNORMAL LOW (ref 6.5–8.1)

## 2022-03-30 LAB — HEMOGLOBIN A1C
Hgb A1c MFr Bld: 5.6 % (ref 4.8–5.6)
Mean Plasma Glucose: 114.02 mg/dL

## 2022-03-30 LAB — GLUCOSE, CAPILLARY
Glucose-Capillary: 112 mg/dL — ABNORMAL HIGH (ref 70–99)
Glucose-Capillary: 133 mg/dL — ABNORMAL HIGH (ref 70–99)
Glucose-Capillary: 134 mg/dL — ABNORMAL HIGH (ref 70–99)
Glucose-Capillary: 169 mg/dL — ABNORMAL HIGH (ref 70–99)

## 2022-03-30 LAB — PHOSPHORUS: Phosphorus: 1.4 mg/dL — ABNORMAL LOW (ref 2.5–4.6)

## 2022-03-30 LAB — MAGNESIUM: Magnesium: 2.1 mg/dL (ref 1.7–2.4)

## 2022-03-30 MED ORDER — POTASSIUM PHOSPHATES 15 MMOLE/5ML IV SOLN
30.0000 mmol | Freq: Once | INTRAVENOUS | Status: AC
  Administered 2022-03-30: 30 mmol via INTRAVENOUS
  Filled 2022-03-30: qty 10

## 2022-03-30 MED ORDER — MAGIC MOUTHWASH W/LIDOCAINE
5.0000 mL | Freq: Four times a day (QID) | ORAL | Status: DC
Start: 2022-03-30 — End: 2022-03-31
  Administered 2022-03-30: 5 mL via ORAL
  Filled 2022-03-30 (×6): qty 5

## 2022-03-31 DIAGNOSIS — N390 Urinary tract infection, site not specified: Secondary | ICD-10-CM | POA: Diagnosis not present

## 2022-03-31 DIAGNOSIS — K56609 Unspecified intestinal obstruction, unspecified as to partial versus complete obstruction: Secondary | ICD-10-CM | POA: Diagnosis not present

## 2022-03-31 DIAGNOSIS — I5032 Chronic diastolic (congestive) heart failure: Secondary | ICD-10-CM | POA: Diagnosis not present

## 2022-03-31 DIAGNOSIS — D649 Anemia, unspecified: Secondary | ICD-10-CM

## 2022-03-31 DIAGNOSIS — I700 Atherosclerosis of aorta: Secondary | ICD-10-CM | POA: Diagnosis not present

## 2022-03-31 LAB — GLUCOSE, CAPILLARY
Glucose-Capillary: 142 mg/dL — ABNORMAL HIGH (ref 70–99)
Glucose-Capillary: 143 mg/dL — ABNORMAL HIGH (ref 70–99)
Glucose-Capillary: 148 mg/dL — ABNORMAL HIGH (ref 70–99)
Glucose-Capillary: 154 mg/dL — ABNORMAL HIGH (ref 70–99)
Glucose-Capillary: 161 mg/dL — ABNORMAL HIGH (ref 70–99)

## 2022-03-31 LAB — CBC
HCT: 37.6 % (ref 36.0–46.0)
Hemoglobin: 11.6 g/dL — ABNORMAL LOW (ref 12.0–15.0)
MCH: 26.4 pg (ref 26.0–34.0)
MCHC: 30.9 g/dL (ref 30.0–36.0)
MCV: 85.6 fL (ref 80.0–100.0)
Platelets: 200 10*3/uL (ref 150–400)
RBC: 4.39 MIL/uL (ref 3.87–5.11)
RDW: 13.4 % (ref 11.5–15.5)
WBC: 8.3 10*3/uL (ref 4.0–10.5)
nRBC: 0 % (ref 0.0–0.2)

## 2022-03-31 LAB — RENAL FUNCTION PANEL
Albumin: 3 g/dL — ABNORMAL LOW (ref 3.5–5.0)
Anion gap: 7 (ref 5–15)
BUN: 5 mg/dL — ABNORMAL LOW (ref 8–23)
CO2: 23 mmol/L (ref 22–32)
Calcium: 8.7 mg/dL — ABNORMAL LOW (ref 8.9–10.3)
Chloride: 110 mmol/L (ref 98–111)
Creatinine, Ser: 0.64 mg/dL (ref 0.44–1.00)
GFR, Estimated: >60 mL/min (ref >60)
Glucose, Bld: 137 mg/dL — ABNORMAL HIGH (ref 70–99)
Phosphorus: 2.3 mg/dL — ABNORMAL LOW (ref 2.5–4.6)
Potassium: 3.5 mmol/L (ref 3.5–5.1)
Sodium: 140 mmol/L (ref 135–145)

## 2022-03-31 LAB — MAGNESIUM: Magnesium: 2.2 mg/dL (ref 1.7–2.4)

## 2022-04-01 DIAGNOSIS — K56609 Unspecified intestinal obstruction, unspecified as to partial versus complete obstruction: Secondary | ICD-10-CM | POA: Diagnosis not present

## 2022-04-01 DIAGNOSIS — I700 Atherosclerosis of aorta: Secondary | ICD-10-CM | POA: Diagnosis not present

## 2022-04-01 DIAGNOSIS — N390 Urinary tract infection, site not specified: Secondary | ICD-10-CM | POA: Diagnosis not present

## 2022-04-01 DIAGNOSIS — I5032 Chronic diastolic (congestive) heart failure: Secondary | ICD-10-CM | POA: Diagnosis not present

## 2022-04-01 LAB — RENAL FUNCTION PANEL
Albumin: 3 g/dL — ABNORMAL LOW (ref 3.5–5.0)
Anion gap: 5 (ref 5–15)
BUN: 7 mg/dL — ABNORMAL LOW (ref 8–23)
CO2: 23 mmol/L (ref 22–32)
Calcium: 9 mg/dL (ref 8.9–10.3)
Chloride: 114 mmol/L — ABNORMAL HIGH (ref 98–111)
Creatinine, Ser: 0.74 mg/dL (ref 0.44–1.00)
GFR, Estimated: >60 mL/min (ref >60)
Glucose, Bld: 133 mg/dL — ABNORMAL HIGH (ref 70–99)
Phosphorus: 2.8 mg/dL (ref 2.5–4.6)
Potassium: 4 mmol/L (ref 3.5–5.1)
Sodium: 142 mmol/L (ref 135–145)

## 2022-04-01 LAB — CBC
HCT: 35.8 % — ABNORMAL LOW (ref 36.0–46.0)
Hemoglobin: 11 g/dL — ABNORMAL LOW (ref 12.0–15.0)
MCH: 26.5 pg (ref 26.0–34.0)
MCHC: 30.7 g/dL (ref 30.0–36.0)
MCV: 86.3 fL (ref 80.0–100.0)
Platelets: 198 10*3/uL (ref 150–400)
RBC: 4.15 MIL/uL (ref 3.87–5.11)
RDW: 13.3 % (ref 11.5–15.5)
WBC: 6.5 10*3/uL (ref 4.0–10.5)
nRBC: 0 % (ref 0.0–0.2)

## 2022-04-01 LAB — GLUCOSE, CAPILLARY
Glucose-Capillary: 136 mg/dL — ABNORMAL HIGH (ref 70–99)
Glucose-Capillary: 102 mg/dL — ABNORMAL HIGH (ref 70–99)
Glucose-Capillary: 114 mg/dL — ABNORMAL HIGH (ref 70–99)
Glucose-Capillary: 103 mg/dL — ABNORMAL HIGH (ref 70–99)

## 2022-04-01 LAB — MAGNESIUM: Magnesium: 1.3 mg/dL — ABNORMAL LOW (ref 1.7–2.4)

## 2022-04-01 MED ORDER — MAGNESIUM SULFATE 4 GM/100ML IV SOLN
4.0000 g | Freq: Once | INTRAVENOUS | Status: AC
  Administered 2022-04-01: 4 g via INTRAVENOUS
  Filled 2022-04-01: qty 100

## 2022-04-01 MED ORDER — OXYCODONE HCL 5 MG PO TABS: 5.0000 mg | ORAL_TABLET | Freq: Four times a day (QID) | ORAL | Status: DC | PRN

## 2022-04-01 MED ORDER — INSULIN ASPART 100 UNIT/ML IJ SOLN: 0.0000 [IU] | Freq: Every day | INTRAMUSCULAR | Status: DC

## 2022-04-01 MED ORDER — INSULIN ASPART 100 UNIT/ML IJ SOLN: 0.0000 [IU] | Freq: Three times a day (TID) | INTRAMUSCULAR | Status: DC

## 2022-04-02 DIAGNOSIS — K56609 Unspecified intestinal obstruction, unspecified as to partial versus complete obstruction: Secondary | ICD-10-CM | POA: Diagnosis not present

## 2022-04-02 DIAGNOSIS — N390 Urinary tract infection, site not specified: Secondary | ICD-10-CM | POA: Diagnosis not present

## 2022-04-02 DIAGNOSIS — I10 Essential (primary) hypertension: Secondary | ICD-10-CM | POA: Diagnosis not present

## 2022-04-02 DIAGNOSIS — K2100 Gastro-esophageal reflux disease with esophagitis, without bleeding: Secondary | ICD-10-CM | POA: Diagnosis not present

## 2022-04-02 DIAGNOSIS — E118 Type 2 diabetes mellitus with unspecified complications: Secondary | ICD-10-CM | POA: Diagnosis not present

## 2022-04-02 DIAGNOSIS — D649 Anemia, unspecified: Secondary | ICD-10-CM | POA: Diagnosis not present

## 2022-04-02 DIAGNOSIS — K5909 Other constipation: Secondary | ICD-10-CM | POA: Diagnosis not present

## 2022-04-02 DIAGNOSIS — E1169 Type 2 diabetes mellitus with other specified complication: Secondary | ICD-10-CM | POA: Diagnosis not present

## 2022-04-02 DIAGNOSIS — I700 Atherosclerosis of aorta: Secondary | ICD-10-CM | POA: Diagnosis not present

## 2022-04-02 DIAGNOSIS — I5032 Chronic diastolic (congestive) heart failure: Secondary | ICD-10-CM | POA: Diagnosis not present

## 2022-04-02 LAB — RENAL FUNCTION PANEL
Albumin: 3 g/dL — ABNORMAL LOW (ref 3.5–5.0)
Anion gap: 8 (ref 5–15)
BUN: 14 mg/dL (ref 8–23)
CO2: 23 mmol/L (ref 22–32)
Calcium: 8.9 mg/dL (ref 8.9–10.3)
Chloride: 109 mmol/L (ref 98–111)
Creatinine, Ser: 0.84 mg/dL (ref 0.44–1.00)
GFR, Estimated: >60 mL/min (ref >60)
Glucose, Bld: 95 mg/dL (ref 70–99)
Phosphorus: 3.9 mg/dL (ref 2.5–4.6)
Potassium: 4.1 mmol/L (ref 3.5–5.1)
Sodium: 140 mmol/L (ref 135–145)

## 2022-04-02 LAB — CBC
HCT: 37.9 % (ref 36.0–46.0)
Hemoglobin: 11.4 g/dL — ABNORMAL LOW (ref 12.0–15.0)
MCH: 26.2 pg (ref 26.0–34.0)
MCHC: 30.1 g/dL (ref 30.0–36.0)
MCV: 87.1 fL (ref 80.0–100.0)
Platelets: 205 10*3/uL (ref 150–400)
RBC: 4.35 MIL/uL (ref 3.87–5.11)
RDW: 13.5 % (ref 11.5–15.5)
WBC: 6 10*3/uL (ref 4.0–10.5)
nRBC: 0 % (ref 0.0–0.2)

## 2022-04-02 LAB — GLUCOSE, CAPILLARY
Glucose-Capillary: 108 mg/dL — ABNORMAL HIGH (ref 70–99)
Glucose-Capillary: 105 mg/dL — ABNORMAL HIGH (ref 70–99)

## 2022-04-02 LAB — CREATININE, SERUM
Creatinine, Ser: 0.87 mg/dL (ref 0.44–1.00)
GFR, Estimated: >60 mL/min (ref >60)

## 2022-04-02 LAB — MAGNESIUM: Magnesium: 2.1 mg/dL (ref 1.7–2.4)

## 2022-04-02 NOTE — Discharge Summary (Signed)
Physician Discharge Summary  Jenny Madden KVQ:[id number] DOB: 03-30-1941 DOA: 03/26/2022  PCP: Myrlene Broker, MD  Admit date: 03/26/2022 Discharge date: 04/02/2022 Admitted From: Home Disposition: Home Recommendations for Outpatient Follow-up:  Follow ups as below. Please obtain CBC and CMP at follow-up in 1 to 2 weeks Please follow up on the following pending results: None  Home Health: Not indicated Equipment/Devices: Not indicated  Discharge Condition: Stable CODE STATUS: Full code  Follow-up Information     Emelia Loron, MD Follow up in 3 week(s).   Specialty: General Surgery Why: Our office is working on this and will contact you with an appointment date and time. Contact information: [address] [place] [address] [phone]         [facility], [facility] Follow up on 04/09/2022.   Specialty: General Surgery Why: 3pm. This is a nurse visit for staple removal. Please bring a copy of your photo ID and insurance card. Please arrive 30 minutes prior to your appointment for paperwork. Contact information: [address] [place] [address] [phone]         Myrlene Broker, MD. Schedule an appointment as soon as possible for a visit in 1 week(s).   Specialty: Internal Medicine Contact information: [address] [place] [address] [phone]                 Hospital course 81 year old F with PMH of left RCC, DM-2, diastolic CHF, constipation, HTN, anxiety and allergic rhinitis presenting with abdominal pain radiating from epigastric area to RLQ, and vomiting and found to have small bowel obstruction for which she underwent ex lap with lysis of adhesion on 03/27/2021 by Dr. Dwain Sarna.  There was also concern about UTI for which she was started on IV ceftriaxone.  Urine culture with 10,000 colonies of insignificant growth.   Patient had delay in the return of bowel function after  surgery.  Eventually, she had bowel movements and tolerated soft diet.  She was cleared for discharge by general surgery for outpatient follow-up.   See individual problem list below for more.   Problems addressed during this hospitalization Principal Problem:   SBO (small bowel obstruction) (HCC) Active Problems:   Diabetes mellitus type 2 with complications (HCC)   Hyperlipidemia associated with type 2 diabetes mellitus (HCC)   Essential hypertension   GERD   Constipation   Sore throat   Palpitations   Aortic atherosclerosis (HCC)   Chronic diastolic heart failure (HCC)   Acute UTI (urinary tract infection)   Hypophosphatemia   Normocytic anemia   Hypomagnesemia    Small bowel obstruction: abdominal pain and vomiting on presentation. S/p ex lap with lysis of adhesion on 03/27/2022 by Dr. Dwain Sarna.  Delayed bowel function but eventually returned.  She had bowel movements and tolerated soft diet. -Outpatient follow-up with general surgery -Diet, wound care and postsurgical and instructions per surgery.   Pyuria: UTI ruled out.  UA with moderate LE, moderate Hgb, nitrites and rare bacteria.  Urine culture with insignificant growth.  Received IV ceftriaxone for 3 days regardless.   Controlled NIDDM-2 with hyperglycemia and hyperlipidemia: A1c 5.6%.  Does not seem to be on medication at home.  CBG within acceptable range. -Continue home Lipitor.   AKI: CKD ruled out   Aortic atherosclerosis -Continue home Lipitor   Essential hypertension: Normotensive off home antihypertensive meds.   -Continue home amlodipine   Chronic diastolic CHF: TTE with LVEF of 65 to  70%, G1 DD, severe LAE.  Not on diuretics.  No cardiopulmonary symptoms.   Normocytic anemia: H&H relatively stable. -Recheck CBC at follow-up   Hypomagnesemia/hypophosphatemia: Resolved   GERD -Continue home meds   Palpitations: Resolved.   Sore throat: Likely from intubation and NG tube.  Seems to have  resolved.   Vital signs Vitals:   04/01/22 1307 04/01/22 1757 04/01/22 1943 04/02/22 0405  BP: 124/74 102/70 127/73 131/68  Pulse: 76 79 80 70  Temp: 98.1 F (36.7 C)  98.1 F (36.7 C) 98 F (36.7 C)  Resp: 17  17 20   Height:      Weight:      SpO2: 99% 98% 97% 98%  TempSrc:    Oral  BMI (Calculated):         Discharge exam  GENERAL: No apparent distress.  Nontoxic. HEENT: MMM.  Vision and hearing grossly intact.  NECK: Supple.  No apparent JVD.  RESP:  No IWOB.  Fair aeration bilaterally. CVS:  RRR.  2/6 SEM all over. ABD/GI/GU: BS+. Abd soft, NTND.  Staples in place. MSK/EXT:  Moves extremities. No apparent deformity. No edema.  SKIN: no apparent skin lesion or wound NEURO: Awake and alert. Oriented appropriately.  No apparent focal neuro deficit. PSYCH: Calm. Normal affect.   Discharge Instructions Discharge Instructions     Call MD for:  extreme fatigue   Complete by: As directed    Call MD for:  persistant nausea and vomiting   Complete by: As directed    Call MD for:  severe uncontrolled pain   Complete by: As directed    Call MD for:  temperature >100.4   Complete by: As directed    Diet - low sodium heart healthy   Complete by: As directed    Continue soft diet per recommendation by the surgeon.   Discharge instructions   Complete by: As directed    It has been a pleasure taking care of you!  You were hospitalized with bowel obstruction for which you have been treated surgically.  Your symptoms improved to the point we think it is safe to let you go home and follow-up with your surgeon and primary care doctor.  Please follow the directions by your surgeon about diet, wound care, follow-up and return precautions.   Take care,   Discharge wound care:   Complete by: As directed    Per recommendation by your surgeon.   Increase activity slowly   Complete by: As directed       Allergies as of 04/02/2022   No Known Allergies      Medication List      TAKE these medications    acetaminophen 325 MG tablet Commonly known as: Tylenol Take 2 tablets (650 mg total) by mouth every 6 (six) hours as needed for moderate pain.   amLODipine 10 MG tablet Commonly known as: NORVASC TAKE 1 TABLET DAILY   aspirin 81 MG tablet Take 81 mg by mouth every morning.   atorvastatin 20 MG tablet Commonly known as: LIPITOR TAKE 1 TABLET DAILY   blood glucose meter kit and supplies Dispense based on patient and insurance preference. Use up to four times daily as directed. (E11.9).   famotidine 40 MG tablet Commonly known as: PEPCID TAKE 1 TABLET AT BEDTIME   fexofenadine 180 MG tablet Commonly known as: ALLEGRA Take 1 tablet (180 mg total) by mouth daily. Take 1 tablet by mouth once daily as needed for allergies What changed:  when  to take this reasons to take this additional instructions   fluticasone 50 MCG/ACT nasal spray Commonly known as: FLONASE Place 2 sprays into both nostrils daily.   ONE TOUCH ULTRA 2 w/Device Kit USE TO CHECK BLOOD SUGAR   TWO TIMES A DAY   OneTouch Delica Plus Lancet33G Misc CHECK BLOOD SUGAR TWO TIMESA DAY.   OneTouch Ultra test strip Generic drug: glucose blood CHECK BLOOD SUGAR TWO TIMESA DAY   pantoprazole 40 MG tablet Commonly known as: PROTONIX TAKE 1 TABLET TWICE DAILY  BEFORE MEALS What changed: See the new instructions.   polyethylene glycol powder 17 GM/SCOOP powder Commonly known as: GLYCOLAX/MIRALAX Take 17 g by mouth 2 (two) times daily as needed.   Vitamin D 50 MCG (2000 UT) Caps Take 2,000 Units by mouth daily.               Discharge Care Instructions  (From admission, onward)           Start     Ordered   04/02/22 0000  Discharge wound care:       Comments: Per recommendation by your surgeon.   04/02/22 1134            Consultations: General surgery  Procedures/Studies: 03/27/2022-ex lap with lysis of adhesion by Dr. Kathryne Gin Abd 1  View  Result Date: 03/28/2022 CLINICAL DATA:  Enteric tube placement. EXAM: ABDOMEN - 1 VIEW COMPARISON:  Abdominal x-ray from yesterday. FINDINGS: Unchanged enteric tube in the stomach. Oral contrast within the ascending colon. Continued small bowel dilatation. IMPRESSION: 1. Unchanged enteric tube in the stomach. 2. Oral contrast now in the colon. In the setting of continued small bowel dilatation, findings are suggestive of partial small bowel obstruction. Electronically Signed   By: Obie Dredge M.D.   On: 03/28/2022 19:21   DG Abd Portable 1V  Result Date: 03/27/2022 CLINICAL DATA:  Female at age 17 presenting for evaluation of suspected small-bowel obstruction. EXAM: PORTABLE ABDOMEN - 1 VIEW COMPARISON:  March 26, 2022. FINDINGS: Gastric tube tip in the distal stomach. Side port well below GE junction. Retained contrast within the gastric fundus. Dilute contrast throughout small bowel loops which remain dilated in the RIGHT hemiabdomen. No signs of colonic contrast at this time. Bowel loops appear more distended than on the CT and the most recent abdominal radiograph. IMPRESSION: Gastric tube remains in place. Despite this there is increasing distension of small bowel loops over a series of prior studies. Concentrated contrast remaining in the stomach in the gastric fundus. Dilute contrast within dilated small bowel loops in the RIGHT hemiabdomen, compatible with small bowel obstruction. Electronically Signed   By: Donzetta Kohut M.D.   On: 03/27/2022 08:55   DG Abd Portable 1V-Small Bowel Obstruction Protocol-initial, 8 hr delay  Result Date: 03/26/2022 CLINICAL DATA:  8 hour post contrast small bowel protocol EXAM: PORTABLE ABDOMEN - 1 VIEW COMPARISON:  03/26/2022 radiograph and CT FINDINGS: Moderate enteral contrast within the stomach. Esophageal tube tip overlies the distal stomach. Dilute contrast faintly visible within small bowel. No definitive colon or rectal contrast is present. There is  contrast within the urinary bladder. IMPRESSION: Contrast bolus in the proximal stomach. Small amount of dilute contrast within small bowel but no definitive colon or rectal contrast is evident. Electronically Signed   By: Jasmine Pang M.D.   On: 03/26/2022 21:19   ECHOCARDIOGRAM COMPLETE  Result Date: 03/26/2022    ECHOCARDIOGRAM REPORT   Patient Name:   Jenny Madden  Gagan Date of Exam: 03/26/2022 Medical Rec #:  [id number]         Height:       62.0 in Accession #:    [id number]        Weight:       145.5 lb Date of Birth:  10-15-1940         BSA:          1.670 m Patient Age:    80 years          BP:           152/76 mmHg Patient Gender: F                 HR:           90 bpm. Exam Location:  Inpatient Procedure: 2D Echo, Color Doppler and Cardiac Doppler Indications:    Diastolic dysfunction  History:        Patient has prior history of Echocardiogram examinations, most                 recent 02/17/2020. Chronic diastolic heart failure,                 Signs/Symptoms:Murmur; Risk Factors:Hypertension and Diabetes.  Sonographer:    Rodrigo Ran Referring Phys: [phone] DAVID MANUEL ORTIZ  Sonographer Comments: Technically difficult study due to poor echo windows. IMPRESSIONS  1. Left ventricular ejection fraction, by estimation, is 65 to 70%. The left ventricle has hyperdynamic function. The left ventricle has no regional wall motion abnormalities. Left ventricular diastolic parameters are consistent with Grade I diastolic dysfunction (impaired relaxation). There is LV midcavitary obliteration in systole.  2. Right ventricular systolic function is normal. The right ventricular size is normal.  3. Left atrial size was severely dilated.  4. The mitral valve is normal in structure. No evidence of mitral valve regurgitation. No evidence of mitral stenosis. Moderate mitral annular calcification.  5. The aortic valve is normal in structure. There is mild calcification of the aortic valve. Aortic valve regurgitation  is not visualized. Aortic valve sclerosis/calcification is present, without any evidence of aortic stenosis. Aortic valve area, by VTI measures 2.92 cm. Aortic valve mean gradient measures 12.0 mmHg. Aortic valve Vmax measures 2.47 m/s.  6. The inferior vena cava is normal in size with greater than 50% respiratory variability, suggesting right atrial pressure of 3 mmHg. FINDINGS  Left Ventricle: Left ventricular ejection fraction, by estimation, is 65 to 70%. The left ventricle has hyperdynamic function. The left ventricle has no regional wall motion abnormalities. The left ventricular internal cavity size was normal in size. There is no left ventricular hypertrophy. Left ventricular diastolic parameters are consistent with Grade I diastolic dysfunction (impaired relaxation). Right Ventricle: The right ventricular size is normal. No increase in right ventricular wall thickness. Right ventricular systolic function is normal. Left Atrium: Left atrial size was severely dilated. Right Atrium: Right atrial size was normal in size. Pericardium: There is no evidence of pericardial effusion. Mitral Valve: The mitral valve is normal in structure. Moderate mitral annular calcification. No evidence of mitral valve regurgitation. No evidence of mitral valve stenosis. Tricuspid Valve: The tricuspid valve is normal in structure. Tricuspid valve regurgitation is trivial. No evidence of tricuspid stenosis. Aortic Valve: The aortic valve is normal in structure. There is mild calcification of the aortic valve. Aortic valve regurgitation is not visualized. Aortic valve sclerosis/calcification is present, without any evidence of aortic stenosis. Aortic valve mean gradient measures 12.0 mmHg. Aortic  valve peak gradient measures 24.4 mmHg. Aortic valve area, by VTI measures 2.92 cm. Pulmonic Valve: The pulmonic valve was normal in structure. Pulmonic valve regurgitation is not visualized. No evidence of pulmonic stenosis. Aorta: The  aortic root is normal in size and structure. Venous: The inferior vena cava is normal in size with greater than 50% respiratory variability, suggesting right atrial pressure of 3 mmHg. IAS/Shunts: No atrial level shunt detected by color flow Doppler.  LEFT VENTRICLE PLAX 2D LVIDd:         3.40 cm   Diastology LVIDs:         2.10 cm   LV e' medial:    6.09 cm/s LV PW:         0.70 cm   LV E/e' medial:  19.0 LV IVS:        1.90 cm   LV e' lateral:   5.22 cm/s LVOT diam:     1.90 cm   LV E/e' lateral: 22.2 LV SV:         92 LV SV Index:   55 LVOT Area:     2.84 cm  RIGHT VENTRICLE             IVC RV Basal diam:  3.00 cm     IVC diam: 1.20 cm RV Mid diam:    2.70 cm RV S prime:     34.40 cm/s TAPSE (M-mode): 3.5 cm LEFT ATRIUM             Index        RIGHT ATRIUM           Index LA diam:        2.90 cm 1.74 cm/m   RA Area:     10.30 cm LA Vol (A2C):   95.1 ml 56.95 ml/m  RA Volume:   18.50 ml  11.08 ml/m LA Vol (A4C):   62.1 ml 37.19 ml/m LA Biplane Vol: 77.2 ml 46.23 ml/m  AORTIC VALVE                     PULMONIC VALVE AV Area (Vmax):    2.39 cm      PV Vmax:          1.94 m/s AV Area (Vmean):   2.45 cm      PV Peak grad:     15.1 mmHg AV Area (VTI):     2.92 cm      PR End Diast Vel: 9.24 msec AV Vmax:           247.00 cm/s AV Vmean:          154.000 cm/s AV VTI:            0.314 m AV Peak Grad:      24.4 mmHg AV Mean Grad:      12.0 mmHg LVOT Vmax:         208.00 cm/s LVOT Vmean:        133.000 cm/s LVOT VTI:          0.323 m LVOT/AV VTI ratio: 1.03  AORTA Ao Root diam: 2.90 cm Ao Asc diam:  3.50 cm MITRAL VALVE                TRICUSPID VALVE MV Area (PHT): 3.19 cm     TR Peak grad:   40.2 mmHg MV Decel Time: 238 msec     TR Vmax:        317.00  cm/s MV E velocity: 116.00 cm/s MV A velocity: 213.00 cm/s  SHUNTS MV E/A ratio:  0.54         Systemic VTI:  0.32 m                             Systemic Diam: 1.90 cm Arvilla Meres MD Electronically signed by Arvilla Meres MD Signature Date/Time:  03/26/2022/4:19:00 PM    Final    DG Abd Portable 1V-Small Bowel Protocol-Position Verification  Result Date: 03/26/2022 CLINICAL DATA:  Upper abdominal pain. EXAM: PORTABLE ABDOMEN - 1 VIEW COMPARISON:  December 30, 2019. FINDINGS: The bowel gas pattern is normal. Distal tip of nasogastric tube is seen in expected position of distal stomach. Residual contrast is noted in the urinary bladder. No radio-opaque calculi or other significant radiographic abnormality are seen. IMPRESSION: No abnormal bowel dilatation is noted. Electronically Signed   By: Lupita Raider M.D.   On: 03/26/2022 09:19   CT ABDOMEN PELVIS W CONTRAST  Result Date: 03/26/2022 CLINICAL DATA:  Nausea vomiting with right lower quadrant abdominal pain. EXAM: CT ABDOMEN AND PELVIS WITH CONTRAST TECHNIQUE: Multidetector CT imaging of the abdomen and pelvis was performed using the standard protocol following bolus administration of intravenous contrast. RADIATION DOSE REDUCTION: This exam was performed according to the departmental dose-optimization program which includes automated exposure control, adjustment of the mA and/or kV according to patient size and/or use of iterative reconstruction technique. CONTRAST:  OMNIPAQUE IOHEXOL 300 MG/ML  SOLN COMPARISON:  04/19/2021 FINDINGS: Lower chest: Chest basilar atelectasis noted both lower lobes. Hepatobiliary: No suspicious focal abnormality within the liver parenchyma. There is no evidence for gallstones, gallbladder wall thickening, or pericholecystic fluid. No intrahepatic or extrahepatic biliary dilation. Pancreas: No focal mass lesion. No dilatation of the main duct. No intraparenchymal cyst. No peripancreatic edema. Spleen: No splenomegaly. No focal mass lesion. Adrenals/Urinary Tract: No adrenal nodule or mass. Right kidney unremarkable. 5.8 cm simple cyst noted upper pole left kidney, similar to prior. No evidence for hydroureter. The urinary bladder shows no wall thickening although  contour is distorted by dilated small bowel in the anterior right pelvis. Stomach/Bowel: Stomach is mildly distended and fluid-filled. Duodenum is normally positioned as is the ligament of Treitz. Proximal small bowel unremarkable. Mid small bowel is dilated and fluid-filled, measuring up to 3 cm diameter. An abrupt transition zone is identified in the anterior right pelvis, immediately adjacent to the distorted urinary bladder (axial image 60/series 2). Small bowel distal to this location than become slightly more distended before a second transition zone is identified in the anterior right lower quadrant on image 61/2. Small volume interloop mesenteric fluid identified in the region of the right lower quadrant and small bowel distal to this region is completely decompressed (see terminal ileum on image 41/2). The appendix is normal. No gross colonic mass. No colonic wall thickening. Diverticuli are seen scattered along the entire length of the colon without CT findings of diverticulitis. Vascular/Lymphatic: There is moderate atherosclerotic calcification of the abdominal aorta without aneurysm. There is no gastrohepatic or hepatoduodenal ligament lymphadenopathy. No retroperitoneal or mesenteric lymphadenopathy. No pelvic sidewall lymphadenopathy. Reproductive: Unremarkable. Other: None. Musculoskeletal: No worrisome lytic or sclerotic osseous abnormality. Degenerative disc disease noted lumbar spine. IMPRESSION: 1. Findings consistent with small bowel obstruction with 2 apparent transition points identified in the anterior right pelvis. There is a small volume interloop mesenteric fluid in this region and small bowel distal to this  area is completely decompressed. While appearance may be related to adhesions, internal hernia is not excluded. 2. Colonic diverticulosis without diverticulitis. 3. Aortic Atherosclerosis (ICD10-I70.0). Electronically Signed   By: Kennith Center M.D.   On: 03/26/2022 07:14        The results of significant diagnostics from this hospitalization (including imaging, microbiology, ancillary and laboratory) are listed below for reference.     Microbiology: Recent Results (from the past 240 hour(s))  Urine Culture     Status: Abnormal   Collection Time: 03/26/22  1:19 PM   Specimen: Urine, Clean Catch  Result Value Ref Range Status   Specimen Description   Final    URINE, CLEAN CATCH Performed at [facility], [address] [place], [place] [address]    Special Requests   Final    NONE Performed at [facility], [address], [place], [place] [address]    Culture (A)  Final    <10,000 COLONIES/mL INSIGNIFICANT GROWTH Performed at [facility], [address] [place], [place] [address]    Report Status 03/27/2022 FINAL  Final     Labs:  CBC: Recent Labs  Lab 03/28/22 0528 03/29/22 0509 03/30/22 0531 03/31/22 0633 04/01/22 0520 04/02/22 0502  WBC 8.4 9.2 8.8 8.3 6.5 6.0  NEUTROABS 6.7  --   --   --   --   --   HGB 11.8* 11.1* 11.5* 11.6* 11.0* 11.4*  HCT 38.8 39.8 37.4 37.6 35.8* 37.9  MCV 86.6 95.2 87.2 85.6 86.3 87.1  PLT 205 182 186 200 198 205   BMP &GFR Recent Labs  Lab 03/29/22 0509 03/30/22 0531 03/31/22 0633 04/01/22 0520 04/02/22 0502  NA 142 141 140 142 140  K 4.0 3.7 3.5 4.0 4.1  CL 114* 113* 110 114* 109  CO2 21* 22 23 23 23   GLUCOSE 65* 134* 137* 133* 95  BUN 15 8 5* 7* 14  CREATININE 0.80 0.66 0.64 0.74 0.84  0.87  CALCIUM 8.7* 8.6* 8.7* 9.0 8.9  MG 2.4 2.1 2.2 1.3* 2.1  PHOS 1.7* 1.4* 2.3* 2.8 3.9   Estimated Creatinine Clearance: 45.9 mL/min (by C-G formula based on SCr of 0.87 mg/dL). Liver & Pancreas: Recent Labs  Lab 03/27/22 0457 03/28/22 0528 03/29/22 0509 03/30/22 0531 03/31/22 9323 04/01/22 0520 04/02/22 0502  AST 21 20 20 20   --   --   --   ALT 12 10 11 11   --   --   --   ALKPHOS 57 43 46 43  --   --   --   BILITOT 0.8 0.6 0.7 0.6  --   --    --   PROT 7.0 6.4* 6.1* 6.1*  --   --   --   ALBUMIN 3.6 3.4* 3.2* 3.1* 3.0* 3.0* 3.0*   No results for input(s): "LIPASE", "AMYLASE" in the last 168 hours. No results for input(s): "AMMONIA" in the last 168 hours. Diabetic: No results for input(s): "HGBA1C" in the last 72 hours. Recent Labs  Lab 04/01/22 1139 04/01/22 1754 04/01/22 2056 04/02/22 0748 04/02/22 1118  GLUCAP 102* 114* 103* 108* 105*   Cardiac Enzymes: No results for input(s): "CKTOTAL", "CKMB", "CKMBINDEX", "TROPONINI" in the last 168 hours. No results for input(s): "PROBNP" in the last 8760 hours. Coagulation Profile: No results for input(s): "INR", "PROTIME" in the last 168 hours. Thyroid Function Tests: No results for input(s): "TSH", "T4TOTAL", "FREET4", "T3FREE", "THYROIDAB" in the last 72 hours. Lipid Profile:  No results for input(s): "CHOL", "HDL", "LDLCALC", "TRIG", "CHOLHDL", "LDLDIRECT" in the last 72 hours. Anemia Panel: No results for input(s): "VITAMINB12", "FOLATE", "FERRITIN", "TIBC", "IRON", "RETICCTPCT" in the last 72 hours. Urine analysis:    Component Value Date/Time   COLORURINE YELLOW 03/26/2022 0156   APPEARANCEUR CLEAR 03/26/2022 0156   LABSPEC 1.010 03/26/2022 0156   PHURINE 8.0 03/26/2022 0156   GLUCOSEU NEGATIVE 03/26/2022 0156   GLUCOSEU NEGATIVE 07/31/2012 1102   HGBUR MODERATE (A) 03/26/2022 0156   BILIRUBINUR NEGATIVE 03/26/2022 0156   KETONESUR NEGATIVE 03/26/2022 0156   PROTEINUR NEGATIVE 03/26/2022 0156   UROBILINOGEN 0.2 12/21/2018 1200   NITRITE POSITIVE (A) 03/26/2022 0156   LEUKOCYTESUR MODERATE (A) 03/26/2022 0156   Sepsis Labs: Invalid input(s): "PROCALCITONIN", "LACTICIDVEN"   SIGNED:  Almon Hercules, MD  [facility] 04/02/2022, 12:39 PM

## 2022-04-02 NOTE — TOC Transition Note (Signed)
Transition of Care [facility]) - CM/SW Discharge Note   Patient Details  Name: Jenny Madden MRN: [id number] Date of Birth: 1941/05/16  Transition of [facility]) CM/SW Contact:  Golda Acre, RN Phone Number: 04/02/2022, 9:09 AM   Clinical Narrative:    Patient dcd to home with no toc needs.     Barriers to Discharge: Continued Medical Work up   Patient Goals and CMS Choice Patient states their goals for this hospitalization and ongoing recovery are:: i would like to go back to my home CMS Medicare.gov Compare Post Acute Care list provided to:: Patient    Discharge Placement                       Discharge Plan and Services   Discharge Planning Services: CM Consult                                 Social Determinants of Health (SDOH) Interventions     Readmission Risk Interventions     No data to display

## 2022-04-02 NOTE — TOC Progression Note (Signed)
Transition of Care [facility]) - Progression Note    Patient Details  Name: SOLANGE EWER MRN: [id number] Date of Birth: [birth year]  Transition of Care [facility]) CM/SW Contact  Golda Acre, RN Phone Number: 04/02/2022, 7:58 AM  Clinical Narrative:    [phone] chart reviewed.  Following for toc needs.   Expected Discharge Plan: Home/Self Care Barriers to Discharge: Continued Medical Work up  Expected Discharge Plan and Services Expected Discharge Plan: Home/Self Care   Discharge Planning Services: CM Consult   Living arrangements for the past 2 months: Single Family Home                                       Social Determinants of Health (SDOH) Interventions    Readmission Risk Interventions     No data to display

## 2022-04-03 ENCOUNTER — Telehealth: Payer: Self-pay

## 2022-04-11 ENCOUNTER — Encounter: Payer: Self-pay | Admitting: Internal Medicine

## 2022-04-11 ENCOUNTER — Ambulatory Visit (INDEPENDENT_AMBULATORY_CARE_PROVIDER_SITE_OTHER): Payer: Medicare HMO | Admitting: Internal Medicine

## 2022-04-11 DIAGNOSIS — R110 Nausea: Secondary | ICD-10-CM

## 2022-04-11 DIAGNOSIS — K56609 Unspecified intestinal obstruction, unspecified as to partial versus complete obstruction: Secondary | ICD-10-CM | POA: Diagnosis not present

## 2022-04-11 DIAGNOSIS — D649 Anemia, unspecified: Secondary | ICD-10-CM | POA: Diagnosis not present

## 2022-04-11 NOTE — Assessment & Plan Note (Signed)
Stable after surgery no labs needed today. No clinical signs of blood loss.

## 2022-04-11 NOTE — Assessment & Plan Note (Signed)
Improving after surgery has zofran to use as needed.

## 2022-04-11 NOTE — Assessment & Plan Note (Signed)
Resolved after surgery and doing well. Advised to gradually increase diet and activity. Advised energy may take several more weeks to return.

## 2022-04-11 NOTE — Patient Instructions (Signed)
We do not need any labs today.   

## 2022-04-11 NOTE — Progress Notes (Signed)
   Subjective:   Patient ID: Jenny Madden, female    DOB: 1941-06-21, 81 y.o.   MRN: [id number]  HPI The patient is an 81 YO female coming in for hospital follow up. Doing well and seeing surgeon in another week or so. Low energy. No nausea. Having BM without problems.   PMH, [facility], social history reviewed and updated  Review of Systems  Constitutional:  Positive for activity change, appetite change and fatigue.  HENT: Negative.    Eyes: Negative.   Respiratory:  Negative for cough, chest tightness and shortness of breath.   Cardiovascular:  Negative for chest pain, palpitations and leg swelling.  Gastrointestinal:  Negative for abdominal distention, abdominal pain, constipation, diarrhea, nausea and vomiting.  Musculoskeletal: Negative.   Skin: Negative.   Neurological: Negative.   Psychiatric/Behavioral: Negative.      Objective:  Physical Exam Constitutional:      Appearance: She is well-developed.  HENT:     Head: Normocephalic and atraumatic.  Cardiovascular:     Rate and Rhythm: Normal rate and regular rhythm.  Pulmonary:     Effort: Pulmonary effort is normal. No respiratory distress.     Breath sounds: Normal breath sounds. No wheezing or rales.  Abdominal:     General: Bowel sounds are normal. There is no distension.     Palpations: Abdomen is soft.     Tenderness: There is no abdominal tenderness. There is no rebound.  Musculoskeletal:     Cervical back: Normal range of motion.  Skin:    General: Skin is warm and dry.  Neurological:     Mental Status: She is alert and oriented to person, place, and time.     Coordination: Coordination normal.     Vitals:   04/11/22 1031  BP: 106/84  Pulse: 70  Resp: 18  SpO2: 98%  Weight: 137 lb 12.8 oz (62.5 kg)  Height: '5\' 2"'$  (1.575 m)    Assessment & Plan:

## 2022-05-01 DIAGNOSIS — H524 Presbyopia: Secondary | ICD-10-CM | POA: Diagnosis not present

## 2022-05-01 DIAGNOSIS — H52223 Regular astigmatism, bilateral: Secondary | ICD-10-CM | POA: Diagnosis not present

## 2022-05-02 ENCOUNTER — Other Ambulatory Visit: Payer: Self-pay | Admitting: Internal Medicine

## 2022-06-19 ENCOUNTER — Ambulatory Visit (INDEPENDENT_AMBULATORY_CARE_PROVIDER_SITE_OTHER): Payer: Medicare HMO | Admitting: Internal Medicine

## 2022-06-19 ENCOUNTER — Encounter: Payer: Self-pay | Admitting: Internal Medicine

## 2022-06-19 VITALS — BP 138/68 | HR 76 | Temp 98.2°F | Ht 62.0 in | Wt 137.0 lb

## 2022-06-19 DIAGNOSIS — K625 Hemorrhage of anus and rectum: Secondary | ICD-10-CM | POA: Insufficient documentation

## 2022-06-19 DIAGNOSIS — Z23 Encounter for immunization: Secondary | ICD-10-CM

## 2022-06-19 LAB — CBC
HCT: 36.8 % (ref 36.0–46.0)
Hemoglobin: 11.9 g/dL — ABNORMAL LOW (ref 12.0–15.0)
MCHC: 32.3 g/dL (ref 30.0–36.0)
MCV: 81.7 fl (ref 78.0–100.0)
Platelets: 215 10*3/uL (ref 150.0–400.0)
RBC: 4.51 Mil/uL (ref 3.87–5.11)
RDW: 14.7 % (ref 11.5–15.5)
WBC: 4.7 10*3/uL (ref 4.0–10.5)

## 2022-06-19 LAB — FERRITIN: Ferritin: 14 ng/mL (ref 10.0–291.0)

## 2022-06-19 MED ORDER — CLOTRIMAZOLE-BETAMETHASONE 1-0.05 % EX CREA: 1.0000 | TOPICAL_CREAM | Freq: Every day | CUTANEOUS | 0 refills | Status: AC

## 2022-06-19 NOTE — Patient Instructions (Signed)
We have sent in a cream to use on the foot twice a day.  We will check the labs and given you the flu shot.

## 2022-06-19 NOTE — Progress Notes (Signed)
   Subjective:   Patient ID: Jenny Madden, female    DOB: 04-15-1941, 81 y.o.   MRN: [id number]  HPI The patient is an 81 YO female coming in for blood with straining for BM.  Review of Systems  Constitutional: Negative.   HENT: Negative.    Eyes: Negative.   Respiratory:  Negative for cough, chest tightness and shortness of breath.   Cardiovascular:  Negative for chest pain, palpitations and leg swelling.  Gastrointestinal:  Positive for blood in stool. Negative for abdominal distention, abdominal pain, constipation, diarrhea, nausea and vomiting.  Musculoskeletal: Negative.   Skin: Negative.   Neurological: Negative.   Psychiatric/Behavioral: Negative.      Objective:  Physical Exam Constitutional:      Appearance: She is well-developed.  HENT:     Head: Normocephalic and atraumatic.  Cardiovascular:     Rate and Rhythm: Normal rate and regular rhythm.  Pulmonary:     Effort: Pulmonary effort is normal. No respiratory distress.     Breath sounds: Normal breath sounds. No wheezing or rales.  Abdominal:     General: Bowel sounds are normal. There is no distension.     Palpations: Abdomen is soft.     Tenderness: There is no abdominal tenderness. There is no rebound.  Musculoskeletal:     Cervical back: Normal range of motion.  Skin:    General: Skin is warm and dry.  Neurological:     Mental Status: She is alert and oriented to person, place, and time.     Coordination: Coordination normal.     Vitals:   06/19/22 0932  BP: 138/68  Pulse: 76  Temp: 98.2 F (36.8 C)  TempSrc: Oral  SpO2: 99%  Weight: 137 lb (62.1 kg)  Height: '5\' 2"'$  (1.575 m)    Assessment & Plan:  Flu shot given at visit

## 2022-06-19 NOTE — Assessment & Plan Note (Signed)
Some straining to go to bathroom with 2 episodes of blood in toilet with BM after straining. Now not present. Prior hemorrhoid bleeding at times. Checking CBC and ferritin. Advised to start stool softener to help avoid straining. She is taking milk of magnesium to help and is still having regular BM. No signs of bowel obstruction.

## 2022-06-20 ENCOUNTER — Telehealth: Payer: Self-pay | Admitting: Internal Medicine

## 2022-06-20 NOTE — Telephone Encounter (Signed)
Patient returned call from this morning & requests call back at [phone]

## 2022-06-21 NOTE — Telephone Encounter (Signed)
Spoke pt regarding stool softener.

## 2022-06-25 ENCOUNTER — Encounter: Payer: Self-pay | Admitting: Internal Medicine

## 2022-07-02 ENCOUNTER — Ambulatory Visit: Payer: Medicare HMO | Admitting: Cardiovascular Disease

## 2022-07-16 ENCOUNTER — Other Ambulatory Visit: Payer: Self-pay | Admitting: Internal Medicine

## 2022-07-16 DIAGNOSIS — Z1231 Encounter for screening mammogram for malignant neoplasm of breast: Secondary | ICD-10-CM

## 2022-07-27 ENCOUNTER — Ambulatory Visit (INDEPENDENT_AMBULATORY_CARE_PROVIDER_SITE_OTHER): Payer: Medicare HMO

## 2022-07-27 ENCOUNTER — Other Ambulatory Visit: Payer: Self-pay | Admitting: Internal Medicine

## 2022-07-27 VITALS — Ht 62.0 in | Wt 136.0 lb

## 2022-07-27 DIAGNOSIS — Z0000 Encounter for general adult medical examination without abnormal findings: Secondary | ICD-10-CM | POA: Diagnosis not present

## 2022-07-27 NOTE — Patient Instructions (Signed)
Jenny Madden , Thank you for taking time to come for your Medicare Wellness Visit. I appreciate your ongoing commitment to your health goals. Please review the following plan we discussed and let me know if I can assist you in the future.   These are the goals we discussed:  Goals       Patient Stated      Continue to stay as healthy and as independent as possible by continuing to be physically and socially active.       Patient Stated (pt-stated)      Continue to walk and be active        This is a list of the screening recommended for you and due dates:  Health Maintenance  Topic Date Due   Colon Cancer Screening  11/07/2021   COVID-19 Vaccine (5 - Pfizer series) 11/11/2021   Eye exam for diabetics  12/18/2022*   Zoster (Shingles) Vaccine (1 of 2) 06/10/2023*   Yearly kidney health urinalysis for diabetes  09/26/2022   Complete foot exam   09/26/2022   Hemoglobin A1C  09/29/2022   Yearly kidney function blood test for diabetes  04/03/2023   Tetanus Vaccine  01/07/2030   Pneumonia Vaccine  Completed   Flu Shot  Completed   DEXA scan (bone density measurement)  Completed   HPV Vaccine  Aged Out  *Topic was postponed. The date shown is not the original due date.    Advanced directives: In Chart   Conditions/risks identified: Aim for 30 minutes of exercise or brisk walking, 6-8 glasses of water, and 5 servings of fruits and vegetables each day.   Next appointment: Follow up in one year for your annual wellness visit    Preventive Care 65 Years and Older, Female Preventive care refers to lifestyle choices and visits with your health care provider that can promote health and wellness. What does preventive care include? A yearly physical exam. This is also called an annual well check. Dental exams once or twice a year. Routine eye exams. Ask your health care provider how often you should have your eyes checked. Personal lifestyle choices, including: Daily care of your teeth  and gums. Regular physical activity. Eating a healthy diet. Avoiding tobacco and drug use. Limiting alcohol use. Practicing safe sex. Taking low-dose aspirin every day. Taking vitamin and mineral supplements as recommended by your health care provider. What happens during an annual well check? The services and screenings done by your health care provider during your annual well check will depend on your age, overall health, lifestyle risk factors, and family history of disease. Counseling  Your health care provider may ask you questions about your: Alcohol use. Tobacco use. Drug use. Emotional well-being. Home and relationship well-being. Sexual activity. Eating habits. History of falls. Memory and ability to understand (cognition). Work and work Statistician. Reproductive health. Screening  You may have the following tests or measurements: Height, weight, and BMI. Blood pressure. Lipid and cholesterol levels. These may be checked every 5 years, or more frequently if you are over 3 years old. Skin check. Lung cancer screening. You may have this screening every year starting at age 21 if you have a 30-pack-year history of smoking and currently smoke or have quit within the past 15 years. Fecal occult blood test (FOBT) of the stool. You may have this test every year starting at age 29. Flexible sigmoidoscopy or colonoscopy. You may have a sigmoidoscopy every 5 years or a colonoscopy every 10 years starting at  age 48. Hepatitis C blood test. Hepatitis B blood test. Sexually transmitted disease (STD) testing. Diabetes screening. This is done by checking your blood sugar (glucose) after you have not eaten for a while (fasting). You may have this done every 1-3 years. Bone density scan. This is done to screen for osteoporosis. You may have this done starting at age 73. Mammogram. This may be done every 1-2 years. Talk to your health care provider about how often you should have regular  mammograms. Talk with your health care provider about your test results, treatment options, and if necessary, the need for more tests. Vaccines  Your health care provider may recommend certain vaccines, such as: Influenza vaccine. This is recommended every year. Tetanus, diphtheria, and acellular pertussis (Tdap, Td) vaccine. You may need a Td booster every 10 years. Zoster vaccine. You may need this after age 52. Pneumococcal 13-valent conjugate (PCV13) vaccine. One dose is recommended after age 58. Pneumococcal polysaccharide (PPSV23) vaccine. One dose is recommended after age 74. Talk to your health care provider about which screenings and vaccines you need and how often you need them. This information is not intended to replace advice given to you by your health care provider. Make sure you discuss any questions you have with your health care provider. Document Released: 10/21/2015 Document Revised: 06/13/2016 Document Reviewed: 07/26/2015 Elsevier Interactive Patient Education  2017 [place] Prevention in the Home Falls can cause injuries. They can happen to people of all ages. There are many things you can do to make your home safe and to help prevent falls. What can I do on the outside of my home? Regularly fix the edges of walkways and driveways and fix any cracks. Remove anything that might make you trip as you walk through a door, such as a raised step or threshold. Trim any bushes or trees on the path to your home. Use bright outdoor lighting. Clear any walking paths of anything that might make someone trip, such as rocks or tools. Regularly check to see if handrails are loose or broken. Make sure that both sides of any steps have handrails. Any raised decks and porches should have guardrails on the edges. Have any leaves, snow, or ice cleared regularly. Use sand or salt on walking paths during winter. Clean up any spills in your garage right away. This includes oil  or grease spills. What can I do in the bathroom? Use night lights. Install grab bars by the toilet and in the tub and shower. Do not use towel bars as grab bars. Use non-skid mats or decals in the tub or shower. If you need to sit down in the shower, use a plastic, non-slip stool. Keep the floor dry. Clean up any water that spills on the floor as soon as it happens. Remove soap buildup in the tub or shower regularly. Attach bath mats securely with double-sided non-slip rug tape. Do not have throw rugs and other things on the floor that can make you trip. What can I do in the bedroom? Use night lights. Make sure that you have a light by your bed that is easy to reach. Do not use any sheets or blankets that are too big for your bed. They should not hang down onto the floor. Have a firm chair that has side arms. You can use this for support while you get dressed. Do not have throw rugs and other things on the floor that can make you trip. What can I  do in the kitchen? Clean up any spills right away. Avoid walking on wet floors. Keep items that you use a lot in easy-to-reach places. If you need to reach something above you, use a strong step stool that has a grab bar. Keep electrical cords out of the way. Do not use floor polish or wax that makes floors slippery. If you must use wax, use non-skid floor wax. Do not have throw rugs and other things on the floor that can make you trip. What can I do with my stairs? Do not leave any items on the stairs. Make sure that there are handrails on both sides of the stairs and use them. Fix handrails that are broken or loose. Make sure that handrails are as long as the stairways. Check any carpeting to make sure that it is firmly attached to the stairs. Fix any carpet that is loose or worn. Avoid having throw rugs at the top or bottom of the stairs. If you do have throw rugs, attach them to the floor with carpet tape. Make sure that you have a light  switch at the top of the stairs and the bottom of the stairs. If you do not have them, ask someone to add them for you. What else can I do to help prevent falls? Wear shoes that: Do not have high heels. Have rubber bottoms. Are comfortable and fit you well. Are closed at the toe. Do not wear sandals. If you use a stepladder: Make sure that it is fully opened. Do not climb a closed stepladder. Make sure that both sides of the stepladder are locked into place. Ask someone to hold it for you, if possible. Clearly mark and make sure that you can see: Any grab bars or handrails. First and last steps. Where the edge of each step is. Use tools that help you move around (mobility aids) if they are needed. These include: Canes. Walkers. Scooters. Crutches. Turn on the lights when you go into a dark area. Replace any light bulbs as soon as they burn out. Set up your furniture so you have a clear path. Avoid moving your furniture around. If any of your floors are uneven, fix them. If there are any pets around you, be aware of where they are. Review your medicines with your doctor. Some medicines can make you feel dizzy. This can increase your chance of falling. Ask your doctor what other things that you can do to help prevent falls. This information is not intended to replace advice given to you by your health care provider. Make sure you discuss any questions you have with your health care provider. Document Released: 07/21/2009 Document Revised: 03/01/2016 Document Reviewed: 10/29/2014 Elsevier Interactive Patient Education  2017 [employer].

## 2022-07-27 NOTE — Progress Notes (Signed)
Subjective:   Jenny Madden is a 81 y.o. female who presents for Medicare Annual (Subsequent) preventive examination.   I connected with  Darlin Drop on 07/27/22 by a audio enabled telemedicine application and verified that I am speaking with the correct person using two identifiers.  Patient Location: Home  Provider Location: Home Office  I discussed the limitations of evaluation and management by telemedicine. The patient expressed understanding and agreed to proceed.  Review of Systems     Cardiac Risk Factors include: advanced age (>44mn, >>61women);diabetes mellitus;hypertension     Objective:    Today's Vitals   07/27/22 1311  Weight: 136 lb (61.7 kg)  Height: '5\' 2"'  (1.575 m)   Body mass index is 24.87 kg/m.     07/27/2022    1:16 PM 03/26/2022    2:30 PM 03/26/2022    1:44 AM 07/18/2021   10:48 AM 08/23/2020    3:51 PM 03/23/2020   11:19 AM 12/29/2019   11:03 PM  Advanced Directives  Does Patient Have a Medical Advance Directive? Yes Yes No Yes Yes Yes No  Type of [place] will   [place] will    Does patient want to make changes to medical advance directive? No - Patient declined No - Patient declined  No - Patient declined  No - Patient declined   Copy of [place]? Yes - validated most recent copy scanned in chart (See row information) No - copy requested       Would patient like information on creating a medical advance directive?       No - Patient declined    Current Medications (verified) Outpatient Encounter Medications as of 07/27/2022  Medication Sig   acetaminophen (TYLENOL) 325 MG tablet Take 2 tablets (650 mg total) by mouth every 6 (six) hours as needed for moderate pain.   amLODipine (NORVASC) 10 MG tablet TAKE 1 TABLET DAILY (Patient taking differently: Take 10 mg by mouth daily.)   aspirin 81 MG  tablet Take 81 mg by mouth every morning.   atorvastatin (LIPITOR) 20 MG tablet TAKE 1 TABLET DAILY   blood glucose meter kit and supplies Dispense based on patient and insurance preference. Use up to four times daily as directed. (E11.9).   Blood Glucose Monitoring Suppl (ONE TOUCH ULTRA 2) w/Device KIT USE TO CHECK BLOOD SUGAR   TWO TIMES A DAY   Cholecalciferol (VITAMIN D) 2000 UNITS CAPS Take 2,000 Units by mouth daily.   clotrimazole-betamethasone (LOTRISONE) cream Apply 1 Application topically daily.   famotidine (PEPCID) 40 MG tablet TAKE 1 TABLET AT BEDTIME (Patient taking differently: Take 40 mg by mouth at bedtime.)   fexofenadine (ALLEGRA) 180 MG tablet Take 1 tablet (180 mg total) by mouth daily. Take 1 tablet by mouth once daily as needed for allergies (Patient taking differently: Take 180 mg by mouth daily as needed.)   fluticasone (FLONASE) 50 MCG/ACT nasal spray Place 2 sprays into both nostrils daily.   Lancets (ONETOUCH DELICA PLUS [id number] MISC CHECK BLOOD SUGAR TWO TIMESA DAY.   ONETOUCH ULTRA test strip CHECK BLOOD SUGAR TWO TIMESA DAY   pantoprazole (PROTONIX) 40 MG tablet TAKE 1 TABLET TWICE DAILY  BEFORE MEALS (Patient taking differently: 40 mg 2 (two) times daily before a meal.)   polyethylene glycol powder (GLYCOLAX/MIRALAX) 17 GM/SCOOP powder Take 17 g by mouth 2 (two) times daily as needed.  No facility-administered encounter medications on file as of 07/27/2022.    Allergies (verified) Patient has no known allergies.   History: Past Medical History:  Diagnosis Date   Allergic rhinitis, cause unspecified    Anxiety state, unspecified    none recent   Benign neoplasm of colon    Cancer ([place])    renal mass   Cataract    Chronic diastolic heart failure ([place]) 03/26/2022   Chronic kidney disease 01/2016   Tumor left side   Constipation 10/19/2015   Diverticulosis of colon (without mention of hemorrhage)    Dysphagia    Family history of adverse reaction to  anesthesia 3 yrs ago   slow to awaken    Family history of malignant neoplasm of gastrointestinal tract    Heart murmur    Hypertension    Nonspecific (abnormal) findings on radiological and other examination of other intrathoracic organs    Nonspecific abnormal electrocardiogram (ECG) (EKG)    Osteopenia 12/27/2011   Osteoporosis    Other dysphagia    Pure hypercholesterolemia    Type II or unspecified type diabetes mellitus without mention of complication, not stated as uncontrolled    Unspecified essential hypertension    Unspecified tinnitus years ago   Vitamin D deficiency 12/27/2011   Past Surgical History:  Procedure Laterality Date   CATARACT EXTRACTION     CESAREAN SECTION     x 1   COLONOSCOPY     colonscopy and endoscopy  5 yrs ago   FOOT SURGERY     bilateral for hammer toes   IR GENERIC HISTORICAL  12/06/2015   IR RADIOLOGIST EVAL & MGMT 12/06/2015 Aletta Edouard, MD [facility] INTERV RAD   IR GENERIC HISTORICAL  05/03/2016   IR RADIOLOGIST EVAL & MGMT 05/03/2016 Aletta Edouard, MD [facility] INTERV RAD   IR GENERIC HISTORICAL  02/09/2016   IR RADIOLOGIST EVAL & MGMT 02/09/2016 [facility] INTER[facility]   IR RADIOLOGIST EVAL & MGMT  01/09/2017   IR RADIOLOGIST EVAL & MGMT  01/14/2018   IR RADIOLOGIST EVAL & MGMT  03/31/2019   IR RADIOLOGIST EVAL & MGMT  03/30/2020   LAPAROTOMY N/A 03/27/2022   Procedure: EXPLORATORY LAPAROTOMY, POSSIBLE SMALL BOWEL RESECTION;  Surgeon: Rolm Bookbinder, MD;  Location: [facility];  Service: General;  Laterality: N/A;   RADIOFREQUENCY ABLATION KIDNEY Left 01/2016   Renal mass   UPPER GASTROINTESTINAL ENDOSCOPY     Family History  Problem Relation Age of Onset   Colon cancer Brother 68   Esophageal cancer Neg Hx    Rectal cancer Neg Hx    Stomach cancer Neg Hx    Social History   Socioeconomic History   Marital status: Widowed    Spouse name: Jonathon Bellows x 46 yrs   Number of children: 1   Years of education: 14   Highest education level: High school graduate   Occupational History   Occupation: Retired  Tobacco Use   Smoking status: Former    Packs/day: 0.50    Years: 30.00    Total pack years: 15.00    Types: Cigarettes    Quit date: 10/09/1999    Years since quitting: 22.8   Smokeless tobacco: Never  Vaping Use   Vaping Use: Never used  Substance and Sexual Activity   Alcohol use: Yes    Alcohol/week: 1.0 standard drink of alcohol    Types: 1 Standard drinks or equivalent per week    Comment: social use   Drug use: No   Sexual  activity: Not Currently    Partners: Male    Birth control/protection: Post-menopausal    Comment: 1st intercourse- 21, partners- 3  Other Topics Concern   Not on file  Social History Narrative   Widowed, Alonzo x 33 yrs   1 child, daughter, 2 grandddaughter   High school education   Live alone; 1 story home   Social Determinants of Health   Financial Resource Strain: Low Risk  (07/27/2022)   Overall Financial Resource Strain (CARDIA)    Difficulty of Paying Living Expenses: Not hard at all  Food Insecurity: No Food Insecurity (07/27/2022)   Hunger Vital Sign    Worried About Running Out of Food in the Last Year: Never true    [place] in the Last Year: Never true  Transportation Needs: No Transportation Needs (07/27/2022)   PRAPARE - Hydrologist (Medical): No    Lack of Transportation (Non-Medical): No  Physical Activity: Insufficiently Active (07/27/2022)   Exercise Vital Sign    Days of Exercise per Week: 3 days    Minutes of Exercise per Session: 30 min  Stress: No Stress Concern Present (07/27/2022)   [place]    Feeling of Stress : Not at all  Social Connections: Moderately Isolated (07/27/2022)   Social Connection and Isolation Panel [NHANES]    Frequency of Communication with Friends and Family: More than three times a week    Frequency of Social Gatherings with Friends and Family:  More than three times a week    Attends Religious Services: More than 4 times per year    Active Member of [employer] or Organizations: No    Attends Archivist Meetings: Never    Marital Status: Widowed    Tobacco Counseling Counseling given: Not Answered   Clinical Intake:  Pre-visit preparation completed: Yes  Pain : No/denies pain     Nutritional Risks: None Diabetes: No  How often do you need to have someone help you when you read instructions, pamphlets, or other written materials from your doctor or pharmacy?: 1 - Never  Diabetic?yes  Nutrition Risk Assessment:  Has the patient had any N/V/D within the last 2 months?  No  Does the patient have any non-healing wounds?  No  Has the patient had any unintentional weight loss or weight gain?  Yes   Diabetes:  Is the patient diabetic?  Yes  If diabetic, was a CBG obtained today?  No  Did the patient bring in their glucometer from home?  No  How often do you monitor your CBG's? 2 x day .   Financial Strains and Diabetes Management:  Are you having any financial strains with the device, your supplies or your medication? No .  Does the patient want to be seen by Chronic Care Management for management of their diabetes?  No  Would the patient like to be referred to a Nutritionist or for Diabetic Management?  No   Diabetic Exams:  Diabetic Eye Exam: Completed 06/2022 Diabetic Foot Exam: Overdue, Pt has been advised about the importance in completing this exam. Pt is scheduled for diabetic foot exam on next office visit .   Interpreter Needed?: No  Information entered by :: Jadene Pierini, LPN   Activities of Daily Living    07/27/2022    1:16 PM 03/26/2022    2:33 PM  In your present state of health, do you have any difficulty performing  the following activities:  Hearing? 0   Vision? 0   Difficulty concentrating or making decisions? 0   Walking or climbing stairs? 0   Dressing or bathing? 0   Doing  errands, shopping? 0 0  Preparing Food and eating ? N   Using the Toilet? N   In the past six months, have you accidently leaked urine? N   Do you have problems with loss of bowel control? N   Managing your Medications? N   Managing your Finances? N   Housekeeping or managing your Housekeeping? N     Patient Care Team: Hoyt Koch, MD as PCP - General (Internal Medicine) [name], MD as PCP - Cardiology (Cardiology) Milus Banister, MD as Attending Physician (Gastroenterology) Konrad Felix, MD as Referring Physician (Ophthalmology) Thamas Jaegers, RMA [facility] Haws, [facility] as Pharmacist (Pharmacist) Princess Bruins, MD as Consulting Physician (Obstetrics and Gynecology) Monna Fam, MD as Consulting Physician (Ophthalmology)  Indicate any recent Medical Services you may have received from other than [facility] providers in the past year (date may be approximate).     Assessment:   This is a routine wellness examination for Channie.  Hearing/Vision screen Vision Screening - Comments:: Annual eye exams wear glasses   Dietary issues and exercise activities discussed: Current Exercise Habits: Home exercise routine, Type of exercise: walking, Time (Minutes): 30, Frequency (Times/Week): 3, Weekly Exercise (Minutes/Week): 90, Exercise limited by: None identified   Goals Addressed             This Visit's Progress    Patient Stated   On track    Continue to stay as healthy and as independent as possible by continuing to be physically and socially active.        Depression Screen    07/27/2022    1:15 PM 06/19/2022    9:35 AM 04/11/2022   10:38 AM 07/18/2021   10:52 AM 03/21/2021   10:03 AM 03/23/2020   11:21 AM 03/17/2020   10:30 AM  PHQ 2/9 Scores  PHQ - 2 Score 0 0 0 0 0 0 0  PHQ- 9 Score   0        Fall Risk    07/27/2022    1:13 PM 06/19/2022    9:35 AM 04/11/2022   10:38 AM 07/18/2021   10:49 AM 03/21/2021   10:03 AM  Fall Risk    Falls in the past year? 0 0 0 0 1  Number falls in past yr: 0 0 0 0 0  Injury with Fall? 0 0 0 0 0  Risk for fall due to : No Fall Risks No Fall Risks  No Fall Risks No Fall Risks  Follow up Falls prevention discussed   Falls evaluation completed Falls evaluation completed    FALL RISK PREVENTION PERTAINING TO THE HOME:  Any stairs in or around the home? No  If so, are there any without handrails? No  Home free of loose throw rugs in walkways, pet beds, electrical cords, etc? Yes  Adequate lighting in your home to reduce risk of falls? Yes   ASSISTIVE DEVICES UTILIZED TO PREVENT FALLS:  Life alert? No  Use of a cane, walker or w/c? No  Grab bars in the bathroom? Yes  Shower chair or bench in shower? No  Elevated toilet seat or a handicapped toilet? No          07/27/2022    1:16 PM 03/23/2020   11:23 AM  6CIT Screen  What Year? 0 points 0 points  What month? 0 points 0 points  What time? 0 points 0 points  Count back from 20 0 points 0 points  Months in reverse 0 points 0 points  Repeat phrase 0 points 0 points  Total Score 0 points 0 points    Immunizations Immunization History  Administered Date(s) Administered   Fluad Quad(high Dose 65+) 06/11/2019, 06/19/2022   H1N1 09/30/2008   Influenza Split 06/28/2011, 07/02/2012   Influenza Whole 06/30/2008, 07/15/2009, 06/28/2010   Influenza, High Dose Seasonal PF 06/21/2016, 07/05/2017, 06/24/2020, 06/20/2021   Influenza,inj,Quad PF,6+ Mos 07/07/2013, 07/09/2014, 07/13/2015   Influenza-Unspecified 06/02/2018   PFIZER(Purple Top)SARS-COV-2 Vaccination 11/13/2019, 12/08/2019, 07/21/2020   Pfizer Covid-19 Vaccine Bivalent Booster 21yr & up 07/11/2021   Pneumococcal Conjugate-13 01/12/2015   Pneumococcal Polysaccharide-23 06/28/2009   Td 12/28/2009   Tdap 01/08/2020    TDAP status: Up to date  Flu Vaccine status: Up to date  Pneumococcal vaccine status: Up to date  Covid-19 vaccine status: Completed  vaccines  Qualifies for Shingles Vaccine? Yes   Zostavax completed Yes   Shingrix Completed?: Yes  Screening Tests Health Maintenance  Topic Date Due   COLONOSCOPY (Pts [phone] coverage will need to be confirmed)  11/07/2021   COVID-19 Vaccine (5 - Pfizer series) 11/11/2021   OPHTHALMOLOGY EXAM  12/18/2022 (Originally 06/28/2021)   Zoster Vaccines- Shingrix (1 of 2) 06/10/2023 (Originally 08/11/1990)   Diabetic kidney evaluation - Urine ACR  09/26/2022   FOOT EXAM  09/26/2022   HEMOGLOBIN A1C  09/29/2022   Diabetic kidney evaluation - GFR measurement  04/03/2023   TETANUS/TDAP  01/07/2030   Pneumonia Vaccine 6550Years old  Completed   INFLUENZA VACCINE  Completed   DEXA SCAN  Completed   HPV VACCINES  Aged Out    Health Maintenance  Health Maintenance Due  Topic Date Due   COLONOSCOPY (Pts [phone] coverage will need to be confirmed)  11/07/2021   COVID-19 Vaccine (5 - Pfizer series) 11/11/2021    Colorectal cancer screening: No longer required.   Mammogram status: No longer required due to age.  Bone Density status: Completed 10/03/2017. Results reflect: Bone density results: OSTEOPENIA. Repeat every 5 years.  Lung Cancer Screening: (Low Dose CT Chest recommended if Age 95-84-80ars, 30 pack-year currently smoking OR have quit w/in 15years.) does not qualify.   Lung Cancer Screening Referral: n/a  Additional Screening:  Hepatitis C Screening: does not qualify;   Vision Screening: Recommended annual ophthalmology exams for early detection of glaucoma and other disorders of the eye. Is the patient up to date with their annual eye exam?  Yes  Who is the provider or what is the name of the office in which the patient attends annual eye exams? Dr.Hecker  If pt is not established with a provider, would they like to be referred to a provider to establish care? No .   Dental Screening: Recommended annual dental exams for proper oral hygiene  Community  Resource Referral / Chronic Care Management: CRR required this visit?  No   CCM required this visit?  No      Plan:     I have personally reviewed and noted the following in the patient's chart:   Medical and social history Use of alcohol, tobacco or illicit drugs  Current medications and supplements including opioid prescriptions. Patient is not currently taking opioid prescriptions. Functional ability and status Nutritional status Physical activity Advanced directives List of other physicians Hospitalizations,  surgeries, and ER visits in previous 12 months Vitals Screenings to include cognitive, depression, and falls Referrals and appointments  In addition, I have reviewed and discussed with patient certain preventive protocols, quality metrics, and best practice recommendations. A written personalized care plan for preventive services as well as general preventive health recommendations were provided to patient.     Daphane Shepherd, LPN   [id number]   Nurse Notes: None

## 2022-07-31 ENCOUNTER — Other Ambulatory Visit: Payer: Self-pay | Admitting: Internal Medicine

## 2022-09-06 ENCOUNTER — Encounter: Payer: Self-pay | Admitting: Obstetrics & Gynecology

## 2022-09-06 ENCOUNTER — Ambulatory Visit (INDEPENDENT_AMBULATORY_CARE_PROVIDER_SITE_OTHER): Payer: Medicare HMO | Admitting: Obstetrics & Gynecology

## 2022-09-06 ENCOUNTER — Telehealth: Payer: Self-pay | Admitting: Internal Medicine

## 2022-09-06 VITALS — BP 124/80 | HR 78 | Ht 62.0 in | Wt 137.0 lb

## 2022-09-06 DIAGNOSIS — Z01419 Encounter for gynecological examination (general) (routine) without abnormal findings: Secondary | ICD-10-CM

## 2022-09-06 DIAGNOSIS — R300 Dysuria: Secondary | ICD-10-CM

## 2022-09-06 MED ORDER — SULFAMETHOXAZOLE-TRIMETHOPRIM 800-160 MG PO TABS: 1.0000 | ORAL_TABLET | Freq: Two times a day (BID) | ORAL | 0 refills | Status: AC

## 2022-09-06 NOTE — Progress Notes (Signed)
Jenny Madden [birth year] [id number]   History:    81 y.o. G3P1A2L1 Widowed x 4 yrs.   RP:  Established patient presenting for annual gyn exam    HPI: Postmenopause, well on no hormone replacement therapy.  No postmenopausal bleeding.  No pelvic pain.  Abstinent.  Last Pap 01/2018 Neg.  Mild suprapubic discomfort, c/o dysuria .  Bowel movements normal.  Breasts normal.  Screening mammo Neg 09/2021.  Body mass index improved to 25.06.  Walking.  Health labs with family physician. Colono 2018.  BD Osteopenia in 09/2017, will schedule at [facility] thru her Fam MD.   Past medical history,surgical history, family history and social history were all reviewed and documented in the EPIC chart.  Gynecologic History No LMP recorded. Patient is postmenopausal.  Obstetric History OB History  Gravida Para Term Preterm AB Living  '3 1 1   2 1  '$ SAB IAB Ectopic Multiple Live Births          1    # Outcome Date GA Lbr Len/2nd Weight Sex Delivery Anes PTL Lv  3 AB           2 AB           1 Term     F CS-Unspec  N LIV     ROS: A ROS was performed and pertinent positives and negatives are included in the history. GENERAL: No fevers or chills. HEENT: No change in vision, no earache, sore throat or sinus congestion. NECK: No pain or stiffness. CARDIOVASCULAR: No chest pain or pressure. No palpitations. PULMONARY: No shortness of breath, cough or wheeze. GASTROINTESTINAL: No abdominal pain, nausea, vomiting or diarrhea, melena or bright red blood per rectum. GENITOURINARY: No urinary frequency, urgency, hesitancy or dysuria. MUSCULOSKELETAL: No joint or muscle pain, no back pain, no recent trauma. DERMATOLOGIC: No rash, no itching, no lesions. ENDOCRINE: No polyuria, polydipsia, no heat or cold intolerance. No recent change in weight. HEMATOLOGICAL: No anemia or easy bruising or bleeding. NEUROLOGIC: No headache, seizures, numbness, tingling or weakness. PSYCHIATRIC: No depression, no loss of interest in  normal activity or change in sleep pattern.     Exam:   BP 124/80 (BP Location: Right Arm, Patient Position: Sitting, Cuff Size: Normal)   Pulse 78   Ht '5\' 2"'$  (1.575 m)   Wt 137 lb (62.1 kg)   BMI 25.06 kg/m   Body mass index is 25.06 kg/m.  General appearance : Well developed well nourished female. No acute distress HEENT: Eyes: no retinal hemorrhage or exudates,  Neck supple, trachea midline, no carotid bruits, no thyroidmegaly Lungs: Clear to auscultation, no rhonchi or wheezes, or rib retractions  Heart: Regular rate and rhythm, no murmurs or gallops Breast:Examined in sitting and supine position were symmetrical in appearance, no palpable masses or tenderness,  no skin retraction, no nipple inversion, no nipple discharge, no skin discoloration, no axillary or supraclavicular lymphadenopathy Abdomen: no palpable masses or tenderness, no rebound or guarding Extremities: no edema or skin discoloration or tenderness  Pelvic: Deferred  U/A:  Yellow, cloudy, Nit Neg, Pro 1+, WBC >60, RBC 3-10, Bacteria Many.  U. Culture pending.   Assessment/Plan:  81 y.o. female for annual exam   1. Dysuria Mild suprapubic discomfort, c/o dysuria.  U/A probable acute cystitis.  Will treat with Bactrim DS 1 tab BID x 3 days.  Usage reviewed, prescription sent to pharmacy.  Pending U. Culture. - Urinalysis,Complete w/RFL Culture  2. Well female exam with routine gynecological exam  Postmenopause, well on no hormone replacement therapy.  No postmenopausal bleeding.  No pelvic pain.  Abstinent.  Last Pap 01/2018 Neg.  Mild suprapubic discomfort, c/o dysuria .  Bowel movements normal.  Breasts normal.  Screening mammo Neg 09/2021.  Body mass index improved to 25.06.  Walking.  Health labs with family physician. Colono 2018.  BD Osteopenia in 09/2017, will schedule at [facility] thru her Fam MD.  Other orders - sulfamethoxazole-trimethoprim (BACTRIM DS) 800-160 MG tablet; Take 1 tablet by mouth 2 (two)  times daily for 3 days.   Princess Bruins MD, 11:01 AM

## 2022-09-09 LAB — URINALYSIS, COMPLETE W/RFL CULTURE
Bilirubin Urine: NEGATIVE
Casts: NONE SEEN /LPF
Crystals: NONE SEEN /HPF
Glucose, UA: NEGATIVE
Hyaline Cast: NONE SEEN /LPF
Ketones, ur: NEGATIVE
Nitrites, Initial: NEGATIVE
Specific Gravity, Urine: 1.01 (ref 1.001–1.035)
WBC, UA: >=60 /HPF — AB (ref 0–5)
Yeast: NONE SEEN /HPF
pH: 7 (ref 5.0–8.0)

## 2022-09-09 LAB — URINE CULTURE
MICRO NUMBER:: 14251832
SPECIMEN QUALITY:: ADEQUATE

## 2022-09-09 LAB — CULTURE INDICATED

## 2022-09-13 ENCOUNTER — Ambulatory Visit: Payer: Medicare HMO

## 2022-09-27 ENCOUNTER — Ambulatory Visit (INDEPENDENT_AMBULATORY_CARE_PROVIDER_SITE_OTHER): Payer: Medicare HMO | Admitting: Internal Medicine

## 2022-09-27 ENCOUNTER — Encounter: Payer: Self-pay | Admitting: Internal Medicine

## 2022-09-27 VITALS — BP 140/60 | HR 81 | Temp 98.2°F | Ht 62.0 in | Wt 143.0 lb

## 2022-09-27 DIAGNOSIS — E1169 Type 2 diabetes mellitus with other specified complication: Secondary | ICD-10-CM | POA: Diagnosis not present

## 2022-09-27 DIAGNOSIS — D649 Anemia, unspecified: Secondary | ICD-10-CM

## 2022-09-27 DIAGNOSIS — I5032 Chronic diastolic (congestive) heart failure: Secondary | ICD-10-CM | POA: Diagnosis not present

## 2022-09-27 DIAGNOSIS — K5909 Other constipation: Secondary | ICD-10-CM | POA: Diagnosis not present

## 2022-09-27 DIAGNOSIS — Z0000 Encounter for general adult medical examination without abnormal findings: Secondary | ICD-10-CM

## 2022-09-27 DIAGNOSIS — I700 Atherosclerosis of aorta: Secondary | ICD-10-CM | POA: Diagnosis not present

## 2022-09-27 DIAGNOSIS — E118 Type 2 diabetes mellitus with unspecified complications: Secondary | ICD-10-CM | POA: Diagnosis not present

## 2022-09-27 DIAGNOSIS — I10 Essential (primary) hypertension: Secondary | ICD-10-CM | POA: Diagnosis not present

## 2022-09-27 DIAGNOSIS — E785 Hyperlipidemia, unspecified: Secondary | ICD-10-CM | POA: Diagnosis not present

## 2022-09-27 LAB — COMPREHENSIVE METABOLIC PANEL
ALT: 11 U/L (ref 0–35)
AST: 19 U/L (ref 0–37)
Albumin: 4.4 g/dL (ref 3.5–5.2)
Alkaline Phosphatase: 81 U/L (ref 39–117)
BUN: 15 mg/dL (ref 6–23)
CO2: 27 mEq/L (ref 19–32)
Calcium: 9.8 mg/dL (ref 8.4–10.5)
Chloride: 104 mEq/L (ref 96–112)
Creatinine, Ser: 1.01 mg/dL (ref 0.40–1.20)
GFR: 51.98 mL/min — ABNORMAL LOW (ref >60.00)
Glucose, Bld: 78 mg/dL (ref 70–99)
Potassium: 4.1 mEq/L (ref 3.5–5.1)
Sodium: 139 mEq/L (ref 135–145)
Total Bilirubin: 0.3 mg/dL (ref 0.2–1.2)
Total Protein: 7.9 g/dL (ref 6.0–8.3)

## 2022-09-27 LAB — LIPID PANEL
Cholesterol: 194 mg/dL (ref 0–200)
HDL: 91.6 mg/dL (ref >39.00)
LDL Cholesterol: 92 mg/dL (ref 0–99)
NonHDL: 102.43
Total CHOL/HDL Ratio: 2
Triglycerides: 54 mg/dL (ref 0.0–149.0)
VLDL: 10.8 mg/dL (ref 0.0–40.0)

## 2022-09-27 LAB — CBC
HCT: 37.8 % (ref 36.0–46.0)
Hemoglobin: 12.1 g/dL (ref 12.0–15.0)
MCHC: 32 g/dL (ref 30.0–36.0)
MCV: 81.1 fl (ref 78.0–100.0)
Platelets: 277 10*3/uL (ref 150.0–400.0)
RBC: 4.66 Mil/uL (ref 3.87–5.11)
RDW: 14.1 % (ref 11.5–15.5)
WBC: 5.7 10*3/uL (ref 4.0–10.5)

## 2022-09-27 LAB — MICROALBUMIN / CREATININE URINE RATIO
Creatinine,U: 10.1 mg/dL
Microalb Creat Ratio: 6.9 mg/g (ref 0.0–30.0)
Microalb, Ur: <0.7 mg/dL (ref 0.0–1.9)

## 2022-09-27 LAB — HEMOGLOBIN A1C: Hgb A1c MFr Bld: 6.1 % (ref 4.6–6.5)

## 2022-09-27 NOTE — Assessment & Plan Note (Signed)
Flu shot up to date. Covid-19 counseled. Pneumonia complete. Shingrix counseled due at pharmacy. Tetanus due 2031. Colonoscopy aged out. Mammogram aged out, pap smear aged out and dexa complete. Counseled about sun safety and mole surveillance. Counseled about the dangers of distracted driving. Given 10 year screening recommendations.

## 2022-09-27 NOTE — Assessment & Plan Note (Signed)
Overall stable and using diet to control.

## 2022-09-27 NOTE — Assessment & Plan Note (Signed)
Foot exam done and checking HgA1c and microalbumin to creatinine ratio and lipid panel. Adjust as needed. Diet controlled and on statin. Not on ACE-I/ARB.

## 2022-09-27 NOTE — Assessment & Plan Note (Signed)
Checking lipid panel and adjust lipitor 20 mg daily as needed. 

## 2022-09-27 NOTE — Assessment & Plan Note (Signed)
Taking aspirin 81 mg daily and statin. Continue.

## 2022-09-27 NOTE — Assessment & Plan Note (Signed)
No flare and not taking any medications for this.

## 2022-09-27 NOTE — Assessment & Plan Note (Signed)
Checking CBC for stability.  

## 2022-09-27 NOTE — Progress Notes (Signed)
   Subjective:   Patient ID: Jenny Madden, female    DOB: 07/11/1941, 81 y.o.   MRN: [id number]  HPI The patient is here for physical.  PMH, [facility], social history reviewed and updated  Review of Systems  Constitutional: Negative.   HENT: Negative.    Eyes: Negative.   Respiratory:  Negative for cough, chest tightness and shortness of breath.   Cardiovascular:  Negative for chest pain, palpitations and leg swelling.  Gastrointestinal:  Negative for abdominal distention, abdominal pain, constipation, diarrhea, nausea and vomiting.  Musculoskeletal: Negative.   Skin: Negative.   Neurological: Negative.   Psychiatric/Behavioral: Negative.      Objective:  Physical Exam Constitutional:      Appearance: She is well-developed.  HENT:     Head: Normocephalic and atraumatic.  Cardiovascular:     Rate and Rhythm: Normal rate and regular rhythm.  Pulmonary:     Effort: Pulmonary effort is normal. No respiratory distress.     Breath sounds: Normal breath sounds. No wheezing or rales.  Abdominal:     General: Bowel sounds are normal. There is no distension.     Palpations: Abdomen is soft.     Tenderness: There is no abdominal tenderness. There is no rebound.  Musculoskeletal:     Cervical back: Normal range of motion.  Skin:    General: Skin is warm and dry.     Comments: Foot exam done  Neurological:     Mental Status: She is alert and oriented to person, place, and time.     Coordination: Coordination normal.     Vitals:   09/27/22 1002 09/27/22 1006  BP: (!) 140/60 (!) 140/60  Pulse: 81   Temp: 98.2 F (36.8 C)   TempSrc: Oral   SpO2: 97%   Weight: 143 lb (64.9 kg)   Height: '5\' 2"'$  (1.575 m)     Assessment & Plan:

## 2022-09-27 NOTE — Assessment & Plan Note (Signed)
BP at goal on amlodipine 10 mg daily and had side effects when we tried to switch.

## 2022-10-05 ENCOUNTER — Encounter: Payer: Self-pay | Admitting: Internal Medicine

## 2022-10-18 ENCOUNTER — Ambulatory Visit
Admission: RE | Admit: 2022-10-18 | Discharge: 2022-10-18 | Disposition: A | Discharge status: Home / Self Care | Payer: Medicare HMO | Source: Ambulatory Visit | Attending: Internal Medicine | Admitting: Internal Medicine

## 2022-10-18 DIAGNOSIS — Z1231 Encounter for screening mammogram for malignant neoplasm of breast: Secondary | ICD-10-CM | POA: Diagnosis not present

## 2022-10-25 DIAGNOSIS — Z833 Family history of diabetes mellitus: Secondary | ICD-10-CM | POA: Diagnosis not present

## 2022-10-25 DIAGNOSIS — Z87891 Personal history of nicotine dependence: Secondary | ICD-10-CM | POA: Diagnosis not present

## 2022-10-25 DIAGNOSIS — E119 Type 2 diabetes mellitus without complications: Secondary | ICD-10-CM | POA: Diagnosis not present

## 2022-10-25 DIAGNOSIS — K219 Gastro-esophageal reflux disease without esophagitis: Secondary | ICD-10-CM | POA: Diagnosis not present

## 2022-10-25 DIAGNOSIS — I10 Essential (primary) hypertension: Secondary | ICD-10-CM | POA: Diagnosis not present

## 2022-10-25 DIAGNOSIS — E785 Hyperlipidemia, unspecified: Secondary | ICD-10-CM | POA: Diagnosis not present

## 2022-10-25 DIAGNOSIS — J302 Other seasonal allergic rhinitis: Secondary | ICD-10-CM | POA: Diagnosis not present

## 2022-10-25 DIAGNOSIS — M8580 Other specified disorders of bone density and structure, unspecified site: Secondary | ICD-10-CM | POA: Diagnosis not present

## 2022-10-25 DIAGNOSIS — I700 Atherosclerosis of aorta: Secondary | ICD-10-CM | POA: Diagnosis not present

## 2022-10-25 DIAGNOSIS — Z008 Encounter for other general examination: Secondary | ICD-10-CM | POA: Diagnosis not present

## 2022-10-25 DIAGNOSIS — Z809 Family history of malignant neoplasm, unspecified: Secondary | ICD-10-CM | POA: Diagnosis not present

## 2022-10-29 ENCOUNTER — Other Ambulatory Visit: Payer: Self-pay | Admitting: Internal Medicine

## 2022-10-31 ENCOUNTER — Telehealth: Payer: Self-pay | Admitting: Internal Medicine

## 2022-10-31 ENCOUNTER — Other Ambulatory Visit: Payer: Self-pay

## 2022-10-31 MED ORDER — FLUTICASONE PROPIONATE 50 MCG/ACT NA SUSP: 2.0000 | Freq: Every day | NASAL | 3 refills | Status: AC

## 2022-10-31 NOTE — Telephone Encounter (Signed)
This medication has been sent in for patient

## 2022-10-31 NOTE — Telephone Encounter (Signed)
Caller & Relationship to patient:   Call back number:  [phone]   Date of last office visit:  09/27/2022   Date of next office visit:  03/29/2023   Medication(s) to be refilled: generic flonase        Preferred Pharmacy:  [facility]

## 2022-11-05 DIAGNOSIS — H524 Presbyopia: Secondary | ICD-10-CM | POA: Diagnosis not present

## 2022-12-14 ENCOUNTER — Other Ambulatory Visit: Payer: Self-pay | Admitting: Internal Medicine

## 2023-01-20 ENCOUNTER — Other Ambulatory Visit: Payer: Self-pay | Admitting: Internal Medicine

## 2023-01-25 ENCOUNTER — Ambulatory Visit (HOSPITAL_COMMUNITY)
Admission: RE | Admit: 2023-01-25 | Discharge: 2023-01-25 | Disposition: A | Discharge status: Home / Self Care | Payer: Medicare HMO | Source: Ambulatory Visit | Attending: Registered Nurse | Admitting: Registered Nurse

## 2023-01-25 ENCOUNTER — Ambulatory Visit
Admission: EM | Admit: 2023-01-25 | Discharge: 2023-01-25 | Disposition: A | Discharge status: Home / Self Care | Payer: Medicare HMO | Attending: Emergency Medicine | Admitting: Emergency Medicine

## 2023-01-25 ENCOUNTER — Other Ambulatory Visit: Payer: Self-pay

## 2023-01-25 DIAGNOSIS — M25561 Pain in right knee: Secondary | ICD-10-CM

## 2023-01-25 DIAGNOSIS — R600 Localized edema: Secondary | ICD-10-CM | POA: Diagnosis not present

## 2023-01-25 DIAGNOSIS — M79604 Pain in right leg: Secondary | ICD-10-CM

## 2023-01-25 DIAGNOSIS — M7989 Other specified soft tissue disorders: Secondary | ICD-10-CM

## 2023-01-25 NOTE — Discharge Instructions (Signed)
We have made an appointment for an ultrasound at [facility] at 2 PM today.  You will need to arrive and have this completed to rule out a DVT. If symptoms become worse or you begin to have chest pain or shortness of breath prior to your appointment you will need to be seen in the emergency room Discussed with patient the risk for possible complications.  Patient understands this and will drive herself

## 2023-01-25 NOTE — ED Provider Notes (Signed)
[name]    CSN: [id number] Arrival date & time: 01/25/23  1056      History   Chief Complaint Chief Complaint  Patient presents with   Leg Swelling    HPI Jenny Madden is a 82 y.o. female.   Patient presents today with right calf pain and redness with swelling since yesterday.  Patient states that she normally has venous insufficiency and swelling to lower ankles.  The ankle swelling is normal but the right calf is swelling is abnormal and continues to get larger.  Patient denies any known injury states as she was walking today noticed the pain increased.  Patient denies any shortness of breath no chest pain.  Has never had this before.  Does not take a baby aspirin daily.    Past Medical History:  Diagnosis Date   Allergic rhinitis, cause unspecified    Anxiety state, unspecified    none recent   Benign neoplasm of colon    Cancer    renal mass   Cataract    Chronic diastolic heart failure 03/26/2022   Chronic kidney disease 01/2016   Tumor left side   Constipation 10/19/2015   Diverticulosis of colon (without mention of hemorrhage)    Dysphagia    Family history of adverse reaction to anesthesia 3 yrs ago   slow to awaken    Family history of malignant neoplasm of gastrointestinal tract    Heart murmur    Hypertension    Nonspecific (abnormal) findings on radiological and other examination of other intrathoracic organs    Nonspecific abnormal electrocardiogram (ECG) (EKG)    Osteopenia 12/27/2011   Osteoporosis    Other dysphagia    Pure hypercholesterolemia    Type II or unspecified type diabetes mellitus without mention of complication, not stated as uncontrolled    Unspecified essential hypertension    Unspecified tinnitus years ago   Vitamin D deficiency 12/27/2011    Patient Active Problem List   Diagnosis Date Noted   Rectal bleeding 06/19/2022   Hypomagnesemia 04/01/2022   Normocytic anemia 03/31/2022   Hypophosphatemia 03/30/2022    Chronic diastolic heart failure 03/26/2022   Aortic atherosclerosis 03/21/2021   Venous insufficiency 03/17/2020   Palpitations 12/08/2019   Right shoulder pain 12/16/2018   Left renal mass 01/18/2016   Schatzki's ring 10/19/2015   Constipation 10/19/2015   Routine general medical examination at a health care facility 07/13/2015   Osteopenia 12/27/2011   Vitamin D deficiency 12/27/2011   Undiagnosed cardiac murmurs 05/12/2008   Diabetes mellitus type 2 with complications 07/29/2007   Hyperlipidemia associated with type 2 diabetes mellitus 07/29/2007   Essential hypertension 07/29/2007   Allergic rhinitis 07/29/2007   GERD 07/29/2007    Past Surgical History:  Procedure Laterality Date   CATARACT EXTRACTION     CESAREAN SECTION     x 1   COLONOSCOPY     colonscopy and endoscopy  5 yrs ago   FOOT SURGERY     bilateral for hammer toes   IR GENERIC HISTORICAL  12/06/2015   IR RADIOLOGIST EVAL & MGMT 12/06/2015 Irish Lack, MD [facility]   IR GENERIC HISTORICAL  05/03/2016   IR RADIOLOGIST EVAL & MGMT 05/03/2016 Irish Lack, MD [facility]   IR GENERIC HISTORICAL  02/09/2016   IR RADIOLOGIST EVAL & MGMT 02/09/2016 [facility]   IR RADIOLOGIST EVAL & MGMT  01/09/2017   IR RADIOLOGIST EVAL & MGMT  01/14/2018   IR RADIOLOGIST EVAL &  MGMT  03/31/2019   IR RADIOLOGIST EVAL & MGMT  03/30/2020   LAPAROTOMY N/A 03/27/2022   Procedure: EXPLORATORY LAPAROTOMY, POSSIBLE SMALL BOWEL RESECTION;  Surgeon: Emelia Loron, MD;  Location: [facility];  Service: General;  Laterality: N/A;   RADIOFREQUENCY ABLATION KIDNEY Left 01/2016   Renal mass   UPPER GASTROINTESTINAL ENDOSCOPY      OB History     Gravida  3   Para  1   Term  1   Preterm      AB  2   Living  1      SAB      IAB      Ectopic      Multiple      Live Births  1            Home Medications    Prior to Admission medications   Medication Sig Start Date End Date Taking? Authorizing  Provider  acetaminophen (TYLENOL) 325 MG tablet Take 2 tablets (650 mg total) by mouth every 6 (six) hours as needed for moderate pain. 12/24/18   Fawze, Mina A, PA-C  amLODipine (NORVASC) 10 MG tablet Take 1 tablet (10 mg total) by mouth daily. 12/14/22   Myrlene Broker, MD  aspirin 81 MG tablet Take 81 mg by mouth every morning.    [provider]  atorvastatin (LIPITOR) 20 MG tablet TAKE 1 TABLET DAILY 01/21/23   Myrlene Broker, MD  blood glucose meter kit and supplies Dispense based on patient and insurance preference. Use up to four times daily as directed. (E11.9). 05/16/18   Myrlene Broker, MD  Blood Glucose Monitoring Suppl (ONE TOUCH ULTRA 2) w/Device KIT USE TO CHECK BLOOD SUGAR   TWO TIMES A DAY 04/05/21   Myrlene Broker, MD  Cholecalciferol (VITAMIN D) 2000 UNITS CAPS Take 2,000 Units by mouth daily.    [provider]  clotrimazole-betamethasone (LOTRISONE) cream Apply 1 Application topically daily. 06/19/22   Myrlene Broker, MD  famotidine (PEPCID) 40 MG tablet TAKE 1 TABLET AT BEDTIME Patient taking differently: Take 40 mg by mouth at bedtime. 01/01/22   Myrlene Broker, MD  fexofenadine (ALLEGRA) 180 MG tablet Take 1 tablet (180 mg total) by mouth daily. Take 1 tablet by mouth once daily as needed for allergies Patient taking differently: Take 180 mg by mouth daily as needed. 09/20/20   Myrlene Broker, MD  fluticasone Aleda Grana) 50 MCG/ACT nasal spray Place 2 sprays into both nostrils daily. 10/31/22   Myrlene Broker, MD  Lancets (ONETOUCH DELICA PLUS Collier Bullock) MISC CHECK BLOOD SUGAR TWO TIMESA DAY. 09/26/20   Myrlene Broker, MD  [facility] ULTRA test strip CHECK BLOOD SUGAR TWO TIMESA DAY 07/27/22   Myrlene Broker, MD  pantoprazole (PROTONIX) 40 MG tablet TAKE 1 TABLET TWICE DAILY  BEFORE MEALS 01/21/23   Myrlene Broker, MD  polyethylene glycol powder (GLYCOLAX/MIRALAX) 17 GM/SCOOP powder Take 17 g by  mouth 2 (two) times daily as needed. 09/20/20   Myrlene Broker, MD    Family History Family History  Problem Relation Age of Onset   Colon cancer Brother 56   Esophageal cancer Neg Hx    Rectal cancer Neg Hx    Stomach cancer Neg Hx     Social History Social History   Tobacco Use   Smoking status: Former    Packs/day: 0.50    Years: 30.00    Additional pack years: 0.00    Total pack  years: 15.00    Types: Cigarettes    Quit date: 10/09/1999    Years since quitting: 23.3   Smokeless tobacco: Never  Vaping Use   Vaping Use: Never used  Substance Use Topics   Alcohol use: Yes    Alcohol/week: 1.0 standard drink of alcohol    Types: 1 Standard drinks or equivalent per week    Comment: social use   Drug use: No     Allergies   Patient has no known allergies.   Review of Systems Review of Systems  Constitutional: Negative.   Respiratory: Negative.    Cardiovascular:  Positive for leg swelling.  Gastrointestinal: Negative.   Genitourinary: Negative.   Musculoskeletal:  Positive for joint swelling.  Skin:        Right calf has small amount of redness with +2 nonpitting edema  Neurological: Negative.      Physical Exam Triage Vital Signs ED Triage Vitals  Enc Vitals Group     BP 01/25/23 1109 126/79     Pulse Rate 01/25/23 1109 76     Resp 01/25/23 1109 19     Temp 01/25/23 1109 98 F (36.7 C)     Temp Source 01/25/23 1109 Oral     SpO2 01/25/23 1109 98 %     Weight --      Height --      Head Circumference --      Peak Flow --      Pain Score 01/25/23 1108 2     Pain Loc --      Pain Edu? --      Excl. in GC? --    No data found.  Updated Vital Signs BP 126/79   Pulse 76   Temp 98 F (36.7 C) (Oral)   Resp 19   SpO2 98%   Visual Acuity Right Eye Distance:   Left Eye Distance:   Bilateral Distance:    Right Eye Near:   Left Eye Near:    Bilateral Near:     Physical Exam Constitutional:      Appearance: Normal appearance.   Cardiovascular:     Rate and Rhythm: Normal rate.  Pulmonary:     Effort: Pulmonary effort is normal.  Abdominal:     General: Abdomen is flat.  Musculoskeletal:        General: Swelling and tenderness present. No signs of injury.     Right lower leg: Edema present.     Left lower leg: Edema present.     Comments: +2 pitting edema to bilateral lower ankles.  Has +1 nonpitting edema with pain to palpation and erythema to right calf area pain to palpation.  Warm to touch  Skin:    Capillary Refill: Capillary refill takes 2 to 3 seconds.     Findings: Erythema present.  Neurological:     General: No focal deficit present.     Mental Status: She is alert.      UC Treatments / Results  Labs (all labs ordered are listed, but only abnormal results are displayed) Labs Reviewed - No data to display  EKG   Radiology No results found.  Procedures Procedures (including critical care time)  Medications Ordered in UC Medications - No data to display  Initial Impression / Assessment and Plan / UC Course  I have reviewed the triage vital signs and the nursing notes.  Pertinent labs & imaging results that were available during my care of the patient were reviewed by  me and considered in my medical decision making (see chart for details).     Discussed with patient we would need to rule out possibilities of a DVT patient will need to have an ultrasound completed. We have made an appointment for an ultrasound at [facility] at 2 PM today.  You will need to arrive and have this completed to rule out a DVT. If symptoms become worse or you begin to have chest pain or shortness of breath prior to your appointment you will need to be seen in the emergency room Discussed with patient the risk for possible complications and plan of care    Final Clinical Impressions(s) / UC Diagnoses   Final diagnoses:  Calf swelling  Arthralgia of right lower leg     Discharge Instructions       We have made an appointment for an ultrasound at [facility] at 2 PM today.  You will need to arrive and have this completed to rule out a DVT. If symptoms become worse or you begin to have chest pain or shortness of breath prior to your appointment you will need to be seen in the emergency room Discussed with patient the risk for possible complications.  Patient understands this and will drive herself       ED Prescriptions   None    PDMP not reviewed this encounter.   Coralyn Mark, NP 01/25/23 1244

## 2023-01-25 NOTE — ED Triage Notes (Signed)
Pt presents to uc with co leg soreness on right calf swelling present bilaterally.

## 2023-01-28 ENCOUNTER — Encounter: Payer: Self-pay | Admitting: Internal Medicine

## 2023-01-28 ENCOUNTER — Ambulatory Visit (INDEPENDENT_AMBULATORY_CARE_PROVIDER_SITE_OTHER): Payer: Medicare HMO | Admitting: Internal Medicine

## 2023-01-28 VITALS — BP 128/60 | HR 79 | Temp 98.2°F | Ht 62.0 in | Wt 148.2 lb

## 2023-01-28 DIAGNOSIS — I10 Essential (primary) hypertension: Secondary | ICD-10-CM

## 2023-01-28 DIAGNOSIS — L03115 Cellulitis of right lower limb: Secondary | ICD-10-CM | POA: Diagnosis not present

## 2023-01-28 MED ORDER — TELMISARTAN 20 MG PO TABS: 20.0000 mg | ORAL_TABLET | Freq: Every day | ORAL | 5 refills | Status: DC

## 2023-01-28 MED ORDER — CEPHALEXIN 500 MG PO CAPS: 500.0000 mg | ORAL_CAPSULE | Freq: Three times a day (TID) | ORAL | 0 refills | Status: DC

## 2023-01-28 MED ORDER — AMLODIPINE BESYLATE 10 MG PO TABS: 5.0000 mg | ORAL_TABLET | Freq: Every day | ORAL | 3 refills | Status: DC

## 2023-01-28 NOTE — Patient Instructions (Addendum)
       Medications changes include :   Keflex 500 mg three times a day for 10 days.  Decrease Amlodipine to 5 mg daily.  Start Telmisartan 20 mg daily.        Return for PCP in about 10 days- 2 weeks for f/u BP, cellulitis.

## 2023-01-28 NOTE — Assessment & Plan Note (Signed)
Chronic Blood pressure well-controlled Leg swelling does not bother her In the past trial of hydrochlorothiazide did not work because she did not tolerate it Will try decreasing amlodipine to 5 mg daily and adding telmisartan 20 mg daily She will come back to see her PCP in approximately 10 days for follow-up

## 2023-01-28 NOTE — Progress Notes (Signed)
Subjective:    Patient ID: Jenny Madden, female    DOB: 26-Apr-1941, 82 y.o.   MRN: [id number]     HPI Jenny Madden is here for follow up from the urgent care.  01/25/23 - urgent care for right lower leg swelling, pain and redness x 1 day.  She has chronic venous insuff with leg swelling, but this swelling is abnormal and getting worse. Denies injury.   She had increased pain with walking. [place] neg for DVT.      Right leg is sore - does not hurt.  Redness is a little better.  Does not feel warm.  The leg is not swollen when she lays down - only when she gets up.    Has rash posterior lower leg by achilles  x 2 years - getting worse.  Does not itch.  Has tried an antibiotic cream - ? Helped.  Does have chronic swelling in her legs which does bother her.  Medications and allergies reviewed with patient and updated if appropriate.  Current Outpatient Medications on File Prior to Visit  Medication Sig Dispense Refill   acetaminophen (TYLENOL) 325 MG tablet Take 2 tablets (650 mg total) by mouth every 6 (six) hours as needed for moderate pain. 30 tablet 0   amLODipine (NORVASC) 10 MG tablet Take 1 tablet (10 mg total) by mouth daily. 90 tablet 3   aspirin 81 MG tablet Take 81 mg by mouth every morning.     atorvastatin (LIPITOR) 20 MG tablet TAKE 1 TABLET DAILY 90 tablet 0   blood glucose meter kit and supplies Dispense based on patient and insurance preference. Use up to four times daily as directed. (E11.9). 1 each 0   Blood Glucose Monitoring Suppl (ONE TOUCH ULTRA 2) w/Device KIT USE TO CHECK BLOOD SUGAR   TWO TIMES A DAY 1 kit 0   Cholecalciferol (VITAMIN D) 2000 UNITS CAPS Take 2,000 Units by mouth daily.     clotrimazole-betamethasone (LOTRISONE) cream Apply 1 Application topically daily. 30 g 0   famotidine (PEPCID) 40 MG tablet TAKE 1 TABLET AT BEDTIME (Patient taking differently: Take 40 mg by mouth at bedtime.) 90 tablet 3   fexofenadine (ALLEGRA) 180 MG tablet Take 1 tablet  (180 mg total) by mouth daily. Take 1 tablet by mouth once daily as needed for allergies (Patient taking differently: Take 180 mg by mouth daily as needed.) 90 tablet 3   fluticasone (FLONASE) 50 MCG/ACT nasal spray Place 2 sprays into both nostrils daily. 48 g 3   Lancets (ONETOUCH DELICA PLUS LANCET33G) MISC CHECK BLOOD SUGAR TWO TIMESA DAY. 200 each 1   ONETOUCH ULTRA test strip CHECK BLOOD SUGAR TWO TIMESA DAY 100 strip 2   pantoprazole (PROTONIX) 40 MG tablet TAKE 1 TABLET TWICE DAILY  BEFORE MEALS 180 tablet 3   polyethylene glycol powder (GLYCOLAX/MIRALAX) 17 GM/SCOOP powder Take 17 g by mouth 2 (two) times daily as needed. 3350 g 1   No current facility-administered medications on file prior to visit.     Review of Systems  Constitutional:  Negative for chills and fever.  Respiratory:  Negative for cough, shortness of breath and wheezing.   Cardiovascular:  Positive for leg swelling. Negative for chest pain and palpitations.  Neurological:  Negative for light-headedness and headaches.       Objective:   Vitals:   01/28/23 1014  BP: 128/60  Pulse: 79  Temp: 98.2 F (36.8 C)  SpO2: 99%   BP  Readings from Last 3 Encounters:  01/28/23 128/60  01/25/23 126/79  09/27/22 (!) 140/60   Wt Readings from Last 3 Encounters:  01/28/23 148 lb 3.2 oz (67.2 kg)  09/27/22 143 lb (64.9 kg)  09/06/22 137 lb (62.1 kg)   Body mass index is 27.11 kg/m.    Physical Exam Constitutional:      General: She is not in acute distress.    Appearance: Normal appearance.  HENT:     Head: Normocephalic and atraumatic.  Eyes:     Conjunctiva/sclera: Conjunctivae normal.  Cardiovascular:     Rate and Rhythm: Normal rate and regular rhythm.  Pulmonary:     Effort: Pulmonary effort is normal. No respiratory distress.     Breath sounds: Normal breath sounds. No wheezing or rales.  Musculoskeletal:     Cervical back: Neck supple.     Right lower leg: Edema (1+ pitting mid calf) present.      Left lower leg: Edema (1+ pitting to just above ankle) present.  Lymphadenopathy:     Cervical: No cervical adenopathy.  Skin:    General: Skin is warm and dry.     Findings: Erythema (Right lower extremity-still mild warmth, tender to touch) present. No rash.  Neurological:     General: No focal deficit present.     Mental Status: She is alert.        Lab Results  Component Value Date   WBC 5.7 09/27/2022   HGB 12.1 09/27/2022   HCT 37.8 09/27/2022   PLT 277.0 09/27/2022   GLUCOSE 78 09/27/2022   CHOL 194 09/27/2022   TRIG 54.0 09/27/2022   HDL 91.60 09/27/2022   LDLCALC 92 09/27/2022   ALT 11 09/27/2022   AST 19 09/27/2022   NA 139 09/27/2022   K 4.1 09/27/2022   CL 104 09/27/2022   CREATININE 1.01 09/27/2022   BUN 15 09/27/2022   CO2 27 09/27/2022   TSH 1.66 09/20/2020   INR 1.1 (H) 12/20/2020   HGBA1C 6.1 09/27/2022   MICROALBUR <0.7 09/27/2022     LE VENOUS  Lower Venous DVT Study  Patient Name:  Jenny Madden  Date of Exam:   01/25/2023 Medical Rec #: [id number]          Accession #:    [id number] Date of Birth: Dec 27, 1940          Patient Gender: F Patient Age:   74 years Exam Location:  [facility] Procedure:      VAS [place] LOWER EXTREMITY VENOUS (DVT) Referring Phys: Shawna Orleans MITCHELL  --------------------------------------------------------------------------------   Indications: Pain, and Swelling.   Comparison Study: No previous exams  Performing Technologist: Jody Hill RVT, RDMS    Examination Guidelines: A complete evaluation includes B-mode imaging, spectral Doppler, color Doppler, and power Doppler as needed of all accessible portions of each vessel. Bilateral testing is considered an integral part of a complete examination. Limited examinations for reoccurring indications may be performed as noted. The reflux portion of the exam is performed with the patient in reverse Trendelenburg.      +---------+---------------+---------+-----------+----------+--------------+ RIGHT    CompressibilityPhasicitySpontaneityPropertiesThrombus Aging +---------+---------------+---------+-----------+----------+--------------+ CFV      Full           Yes      Yes                                 +---------+---------------+---------+-----------+----------+--------------+ SFJ      Full                                                        +---------+---------------+---------+-----------+----------+--------------+  FV Prox  Full           Yes      Yes                                 +---------+---------------+---------+-----------+----------+--------------+ FV Mid   Full           Yes      Yes                                 +---------+---------------+---------+-----------+----------+--------------+ FV DistalFull           Yes      Yes                                 +---------+---------------+---------+-----------+----------+--------------+ PFV      Full                                                        +---------+---------------+---------+-----------+----------+--------------+ POP      Full           Yes      Yes                                 +---------+---------------+---------+-----------+----------+--------------+ PTV      Full                                                        +---------+---------------+---------+-----------+----------+--------------+ PERO     Full                                                        +---------+---------------+---------+-----------+----------+--------------+        +----+---------------+---------+-----------+----------+--------------+ LEFTCompressibilityPhasicitySpontaneityPropertiesThrombus Aging +----+---------------+---------+-----------+----------+--------------+ CFV Full           Yes      Yes                                  +----+---------------+---------+-----------+----------+--------------+             Summary: RIGHT: - There is no evidence of deep vein thrombosis in the lower extremity.   - No cystic structure found in the popliteal fossa. - Subcutaneous edema in area of distal calf and ankle.   LEFT: - No evidence of common femoral vein obstruction.    *See table(s) above for measurements and observations.  Electronically signed by Sherald Hess MD on 01/25/2023 at 3:16:23 PM.      Final      Assessment & Plan:    See Problem List for Assessment and Plan of chronic medical problems.

## 2023-01-28 NOTE — Assessment & Plan Note (Signed)
Acute Ultrasound was negative for DVT Still has erythema and increased swelling right lower extremity compared to left lower extremity (chronic edema related to venous insufficiency bilaterally) Advised elevating legs whenever sitting Start cephalexin 500 mg 3 times daily x 10 days Advised follow-up with PCP to confirm improvement of infection in approximately 10 days

## 2023-02-07 ENCOUNTER — Telehealth: Payer: Self-pay | Admitting: Internal Medicine

## 2023-02-07 ENCOUNTER — Other Ambulatory Visit: Payer: Self-pay | Admitting: Internal Medicine

## 2023-02-07 NOTE — Telephone Encounter (Signed)
Office Policy no antibiotic can be called in without office visit. Pt need appt w/ provider, or someone else.Marland KitchenRaechel Chute

## 2023-02-07 NOTE — Telephone Encounter (Signed)
Prescription Request  02/07/2023  LOV: 09/27/2022  What is the name of the medication or equipment? cephALEXin (KEFLEX) 500 MG capsule [   Pt stated she think the pharmacy did not count the medication correctly because she only have one pill left for tomorrow. Have you contacted your pharmacy to request a refill? No   Which pharmacy would you like this sent to?     [id number] [place], [place] - [address] [place] [address] Phone: [phone] Fax: [phone]  Patient notified that their request is being sent to the clinical staff for review and that they should receive a response within 2 business days.   Please advise at Mobile [phone] (mobile)

## 2023-02-12 ENCOUNTER — Encounter: Payer: Self-pay | Admitting: Internal Medicine

## 2023-02-12 ENCOUNTER — Ambulatory Visit (INDEPENDENT_AMBULATORY_CARE_PROVIDER_SITE_OTHER): Payer: Medicare HMO | Admitting: Internal Medicine

## 2023-02-12 VITALS — BP 140/80 | HR 71 | Temp 98.4°F | Ht 62.0 in | Wt 148.0 lb

## 2023-02-12 DIAGNOSIS — I10 Essential (primary) hypertension: Secondary | ICD-10-CM | POA: Diagnosis not present

## 2023-02-12 LAB — COMPREHENSIVE METABOLIC PANEL
ALT: 9 U/L (ref 0–35)
AST: 16 U/L (ref 0–37)
Albumin: 4 g/dL (ref 3.5–5.2)
Alkaline Phosphatase: 77 U/L (ref 39–117)
BUN: 10 mg/dL (ref 6–23)
CO2: 27 mEq/L (ref 19–32)
Calcium: 9.6 mg/dL (ref 8.4–10.5)
Chloride: 106 mEq/L (ref 96–112)
Creatinine, Ser: 1.05 mg/dL (ref 0.40–1.20)
GFR: 49.48 mL/min — ABNORMAL LOW (ref >60.00)
Glucose, Bld: 83 mg/dL (ref 70–99)
Potassium: 4.4 mEq/L (ref 3.5–5.1)
Sodium: 140 mEq/L (ref 135–145)
Total Bilirubin: 0.3 mg/dL (ref 0.2–1.2)
Total Protein: 7.5 g/dL (ref 6.0–8.3)

## 2023-02-12 MED ORDER — TELMISARTAN 20 MG PO TABS: 20.0000 mg | ORAL_TABLET | Freq: Every day | ORAL | 3 refills | Status: DC

## 2023-02-12 MED ORDER — AMLODIPINE BESYLATE 5 MG PO TABS
5.0000 mg | ORAL_TABLET | Freq: Every day | ORAL | 3 refills | Status: DC
Start: 2023-02-12 — End: 2023-10-29

## 2023-02-12 NOTE — Progress Notes (Signed)
   Subjective:   Patient ID: Jenny Madden, female    DOB: 1941/03/26, 82 y.o.   MRN: [id number]  HPI The patient is an 82 YO female coming in for follow up.   Review of Systems  Constitutional: Negative.   HENT: Negative.    Eyes: Negative.   Respiratory:  Negative for cough, chest tightness and shortness of breath.   Cardiovascular:  Negative for chest pain, palpitations and leg swelling.  Gastrointestinal:  Negative for abdominal distention, abdominal pain, constipation, diarrhea, nausea and vomiting.  Musculoskeletal: Negative.   Skin: Negative.   Neurological: Negative.   Psychiatric/Behavioral: Negative.      Objective:  Physical Exam Constitutional:      Appearance: She is well-developed.  HENT:     Head: Normocephalic and atraumatic.  Cardiovascular:     Rate and Rhythm: Normal rate and regular rhythm.  Pulmonary:     Effort: Pulmonary effort is normal. No respiratory distress.     Breath sounds: Normal breath sounds. No wheezing or rales.  Abdominal:     General: Bowel sounds are normal. There is no distension.     Palpations: Abdomen is soft.     Tenderness: There is no abdominal tenderness. There is no rebound.  Musculoskeletal:     Cervical back: Normal range of motion.  Skin:    General: Skin is warm and dry.  Neurological:     Mental Status: She is alert and oriented to person, place, and time.     Coordination: Coordination normal.     Vitals:   02/12/23 1041 02/12/23 1045  BP: (!) 140/80 (!) 140/80  Pulse: 71   Temp: 98.4 F (36.9 C)   TempSrc: Oral   SpO2: 95%   Weight: 148 lb (67.1 kg)   Height: 5\' 2"  (1.575 m)     Assessment & Plan:

## 2023-02-12 NOTE — Assessment & Plan Note (Signed)
BP at goal on amlodipine 5 mg daily and telmisartan 20 mg daily. Checking CMP and adjust as needed. Offered change to telmisartan 40 mg and stop amlodipine as she still has some swelling but she declines.

## 2023-02-12 NOTE — Patient Instructions (Addendum)
We have sent in the refills. We will check the labs today.    

## 2023-03-11 NOTE — Progress Notes (Signed)
Office Visit    Patient Name: Jenny Madden Date of Encounter: 03/11/2023  Primary Care Provider:  Myrlene Broker, MD Primary Cardiologist:  Verne Carrow, MD Primary Electrophysiologist: None   Past Medical History    Past Medical History:  Diagnosis Date   Allergic rhinitis, cause unspecified    Anxiety state, unspecified    none recent   Benign neoplasm of colon    Cancer (HCC)    renal mass   Cataract    Chronic diastolic heart failure (HCC) 03/26/2022   Chronic kidney disease 01/2016   Tumor left side   Constipation 10/19/2015   Diverticulosis of colon (without mention of hemorrhage)    Dysphagia    Family history of adverse reaction to anesthesia 3 yrs ago   slow to awaken    Family history of malignant neoplasm of gastrointestinal tract    Heart murmur    Hypertension    Nonspecific (abnormal) findings on radiological and other examination of other intrathoracic organs    Nonspecific abnormal electrocardiogram (ECG) (EKG)    Osteopenia 12/27/2011   Osteoporosis    Other dysphagia    Pure hypercholesterolemia    Type II or unspecified type diabetes mellitus without mention of complication, not stated as uncontrolled    Unspecified essential hypertension    Unspecified tinnitus years ago   Vitamin D deficiency 12/27/2011   Past Surgical History:  Procedure Laterality Date   CATARACT EXTRACTION     CESAREAN SECTION     x 1   COLONOSCOPY     colonscopy and endoscopy  5 yrs ago   FOOT SURGERY     bilateral for hammer toes   IR GENERIC HISTORICAL  12/06/2015   IR RADIOLOGIST EVAL & MGMT 12/06/2015 Irish Lack, MD [facility]   IR GENERIC HISTORICAL  05/03/2016   IR RADIOLOGIST EVAL & MGMT 05/03/2016 Irish Lack, MD [facility]   IR GENERIC HISTORICAL  02/09/2016   IR RADIOLOGIST EVAL & MGMT 02/09/2016 [facility]   IR RADIOLOGIST EVAL & MGMT  01/09/2017   IR RADIOLOGIST EVAL & MGMT  01/14/2018   IR RADIOLOGIST EVAL & MGMT   03/31/2019   IR RADIOLOGIST EVAL & MGMT  03/30/2020   LAPAROTOMY N/A 03/27/2022   Procedure: EXPLORATORY LAPAROTOMY, POSSIBLE SMALL BOWEL RESECTION;  Surgeon: Emelia Loron, MD;  Location: [facility];  Service: General;  Laterality: N/A;   RADIOFREQUENCY ABLATION KIDNEY Left 01/2016   Renal mass   UPPER GASTROINTESTINAL ENDOSCOPY      Allergies  No Known Allergies   History of Present Illness    Jenny Madden  is a 82 year old female with a PMH of hokum, HTN, HLD, DM type II, CKD, renal CA s/p ablation who presents today for 1 year follow-up.  Jenny Madden was seen initially by Dr. Clifton James on 01/2020 for complaint of palpitations and cardiac murmur.  She had been seen 12/2019 in the ED for epigastric pain and nausea and was found to have negative troponins.  2D echo completed in 2014 showed hyperdynamic EF of 70% with mitral annular calcification and no MR.  She had mild lower extremity edema that resolves with elevation.  She was on Norvasc and this was discontinued and switched to HCTZ.  She underwent updated 2D echo that showed EF of 60-65% with no RWMA and severe LVH and trivial MVR with severely dilated LA.  She was seen in follow-up on 01/24/2021 and was doing well with exception  of occasional twinge of pain in the right chest and twinge in the right arm.  She endorsed this appears to occur in seconds.  She had no further walker completed at that time.  She was last seen by Dr. Clifton James on 03/2022 reporting no symptoms with plans for repeat 2D echo in 6/24.  She was noted to have normal blood pressure that was managed and lower extremity edema was recurrent and patient was back on Norvasc.  She was seen in the ED on 01/2023 with complaint of right calf pain and redness with swelling.  She denies any shortness of breath and underwent ultrasound of the lower extremity that was negative for DVT.  Jenny Madden presents today for 1 year follow-up alone.  Since last being seen in the office patient  reports she has been doing well from a cardiac perspective with no acute changes since her previous visit.  She does endorse lower extremity swelling that is related with ambulation and resolves at night while resting.  Her blood pressure today is slightly elevated 152/70 and was 136/72 on recheck.  She reports not taking her medicine prior to today's visit.  She is compliant with her current medications and denies any adverse reactions.  She is active and enjoys walking however lately has experienced lower extremity pain that resolves with rest.  She recently underwent lower extremity ultrasounds to rule out DVT that were negative.  During today's visit we also discussed primary and secondary prevention for preventing coronary artery disease.  We also discussed the pathophysiology of congestive heart failure and covered the different types of congestive heart failure that can occur.  Patient denies chest pain, palpitations, dyspnea, PND, orthopnea, nausea, vomiting, dizziness, syncope, edema, weight gain, or early satiety.   Home Medications    Current Outpatient Medications  Medication Sig Dispense Refill   acetaminophen (TYLENOL) 325 MG tablet Take 2 tablets (650 mg total) by mouth every 6 (six) hours as needed for moderate pain. 30 tablet 0   amLODipine (NORVASC) 5 MG tablet Take 1 tablet (5 mg total) by mouth daily. 90 tablet 3   aspirin 81 MG tablet Take 81 mg by mouth every morning.     atorvastatin (LIPITOR) 20 MG tablet TAKE 1 TABLET DAILY 90 tablet 0   blood glucose meter kit and supplies Dispense based on patient and insurance preference. Use up to four times daily as directed. (E11.9). 1 each 0   Blood Glucose Monitoring Suppl (ONE TOUCH ULTRA 2) w/Device KIT USE TO CHECK BLOOD SUGAR   TWO TIMES A DAY 1 kit 0   cephALEXin (KEFLEX) 500 MG capsule Take 1 capsule (500 mg total) by mouth 3 (three) times daily. 30 capsule 0   Cholecalciferol (VITAMIN D) 2000 UNITS CAPS Take 2,000 Units by mouth  daily.     clotrimazole-betamethasone (LOTRISONE) cream Apply 1 Application topically daily. 30 g 0   famotidine (PEPCID) 40 MG tablet Take 1 tablet (40 mg total) by mouth at bedtime. Annual appt due in Dec must see provider for future refills 90 tablet 1   fexofenadine (ALLEGRA) 180 MG tablet Take 1 tablet (180 mg total) by mouth daily. Take 1 tablet by mouth once daily as needed for allergies (Patient taking differently: Take 180 mg by mouth daily as needed.) 90 tablet 3   fluticasone (FLONASE) 50 MCG/ACT nasal spray Place 2 sprays into both nostrils daily. 48 g 3   Lancets (ONETOUCH DELICA PLUS LANCET33G) MISC CHECK BLOOD SUGAR TWO TIMESA DAY. 200  each 1   ONETOUCH ULTRA test strip CHECK BLOOD SUGAR TWO TIMESA DAY 100 strip 2   pantoprazole (PROTONIX) 40 MG tablet TAKE 1 TABLET TWICE DAILY  BEFORE MEALS 180 tablet 3   polyethylene glycol powder (GLYCOLAX/MIRALAX) 17 GM/SCOOP powder Take 17 g by mouth 2 (two) times daily as needed. 3350 g 1   telmisartan (MICARDIS) 20 MG tablet Take 1 tablet (20 mg total) by mouth daily. 90 tablet 3   No current facility-administered medications for this visit.     Review of Systems  Please see the history of present illness.    (+) Lower extremity swelling (+) Lower extremity pain with ambulation  All other systems reviewed and are otherwise negative except as noted above.  Physical Exam    Wt Readings from Last 3 Encounters:  02/12/23 148 lb (67.1 kg)  01/28/23 148 lb 3.2 oz (67.2 kg)  09/27/22 143 lb (64.9 kg)   ZO:XWRUE were no vitals filed for this visit.,There is no height or weight on file to calculate BMI.  Constitutional:      Appearance: Healthy appearance. Not in distress.  Neck:     Vascular: JVD normal.  Pulmonary:     Effort: Pulmonary effort is normal.     Breath sounds: No wheezing. No rales. Diminished in the bases Cardiovascular:     Normal rate. Regular rhythm. Normal S1. Normal S2.      Murmurs: There is no murmur.   Edema:    +1 bilateral lower extremity swelling Abdominal:     Palpations: Abdomen is soft non tender. There is no hepatomegaly.  Skin:    General: Skin is warm and dry.  Neurological:     General: No focal deficit present.     Mental Status: Alert and oriented to person, place and time.     Cranial Nerves: Cranial nerves are intact.  EKG/LABS/ Recent Cardiac Studies    ECG personally reviewed by me today -sinus rhythm with possible left atrial enlargement and rate of 75 bpm with no acute changes consistent with previous EKG.  Cardiac Studies & Procedures       ECHOCARDIOGRAM  ECHOCARDIOGRAM COMPLETE 03/26/2022  Narrative ECHOCARDIOGRAM REPORT    Patient Name:   MANETTE BAYLEY Date of Exam: 03/26/2022 Medical Rec #:  [id number]         Height:       62.0 in Accession #:    [id number]        Weight:       145.5 lb Date of Birth:  1941/03/26         BSA:          1.670 m Patient Age:    80 years          BP:           152/76 mmHg Patient Gender: F                 HR:           90 bpm. Exam Location:  Inpatient  Procedure: 2D Echo, Color Doppler and Cardiac Doppler  Indications:    Diastolic dysfunction  History:        Patient has prior history of Echocardiogram examinations, most recent 02/17/2020. Chronic diastolic heart failure, Signs/Symptoms:Murmur; Risk Factors:Hypertension and Diabetes.  Sonographer:    Rodrigo Ran Referring Phys: [phone] DAVID MANUEL ORTIZ   Sonographer Comments: Technically difficult study due to poor echo windows. IMPRESSIONS   1. Left ventricular ejection fraction,  by estimation, is 65 to 70%. The left ventricle has hyperdynamic function. The left ventricle has no regional wall motion abnormalities. Left ventricular diastolic parameters are consistent with Grade I diastolic dysfunction (impaired relaxation). There is LV midcavitary obliteration in systole. 2. Right ventricular systolic function is normal. The right ventricular size is  normal. 3. Left atrial size was severely dilated. 4. The mitral valve is normal in structure. No evidence of mitral valve regurgitation. No evidence of mitral stenosis. Moderate mitral annular calcification. 5. The aortic valve is normal in structure. There is mild calcification of the aortic valve. Aortic valve regurgitation is not visualized. Aortic valve sclerosis/calcification is present, without any evidence of aortic stenosis. Aortic valve area, by VTI measures 2.92 cm. Aortic valve mean gradient measures 12.0 mmHg. Aortic valve Vmax measures 2.47 m/s. 6. The inferior vena cava is normal in size with greater than 50% respiratory variability, suggesting right atrial pressure of 3 mmHg.  FINDINGS Left Ventricle: Left ventricular ejection fraction, by estimation, is 65 to 70%. The left ventricle has hyperdynamic function. The left ventricle has no regional wall motion abnormalities. The left ventricular internal cavity size was normal in size. There is no left ventricular hypertrophy. Left ventricular diastolic parameters are consistent with Grade I diastolic dysfunction (impaired relaxation).  Right Ventricle: The right ventricular size is normal. No increase in right ventricular wall thickness. Right ventricular systolic function is normal.  Left Atrium: Left atrial size was severely dilated.  Right Atrium: Right atrial size was normal in size.  Pericardium: There is no evidence of pericardial effusion.  Mitral Valve: The mitral valve is normal in structure. Moderate mitral annular calcification. No evidence of mitral valve regurgitation. No evidence of mitral valve stenosis.  Tricuspid Valve: The tricuspid valve is normal in structure. Tricuspid valve regurgitation is trivial. No evidence of tricuspid stenosis.  Aortic Valve: The aortic valve is normal in structure. There is mild calcification of the aortic valve. Aortic valve regurgitation is not visualized. Aortic valve  sclerosis/calcification is present, without any evidence of aortic stenosis. Aortic valve mean gradient measures 12.0 mmHg. Aortic valve peak gradient measures 24.4 mmHg. Aortic valve area, by VTI measures 2.92 cm.  Pulmonic Valve: The pulmonic valve was normal in structure. Pulmonic valve regurgitation is not visualized. No evidence of pulmonic stenosis.  Aorta: The aortic root is normal in size and structure.  Venous: The inferior vena cava is normal in size with greater than 50% respiratory variability, suggesting right atrial pressure of 3 mmHg.  IAS/Shunts: No atrial level shunt detected by color flow Doppler.   LEFT VENTRICLE PLAX 2D LVIDd:         3.40 cm   Diastology LVIDs:         2.10 cm   LV e' medial:    6.09 cm/s LV PW:         0.70 cm   LV E/e' medial:  19.0 LV IVS:        1.90 cm   LV e' lateral:   5.22 cm/s LVOT diam:     1.90 cm   LV E/e' lateral: 22.2 LV SV:         92 LV SV Index:   55 LVOT Area:     2.84 cm   RIGHT VENTRICLE             IVC RV Basal diam:  3.00 cm     IVC diam: 1.20 cm RV Mid diam:    2.70 cm RV S prime:  34.40 cm/s TAPSE (M-mode): 3.5 cm  LEFT ATRIUM             Index        RIGHT ATRIUM           Index LA diam:        2.90 cm 1.74 cm/m   RA Area:     10.30 cm LA Vol (A2C):   95.1 ml 56.95 ml/m  RA Volume:   18.50 ml  11.08 ml/m LA Vol (A4C):   62.1 ml 37.19 ml/m LA Biplane Vol: 77.2 ml 46.23 ml/m AORTIC VALVE                     PULMONIC VALVE AV Area (Vmax):    2.39 cm      PV Vmax:          1.94 m/s AV Area (Vmean):   2.45 cm      PV Peak grad:     15.1 mmHg AV Area (VTI):     2.92 cm      PR End Diast Vel: 9.24 msec AV Vmax:           247.00 cm/s AV Vmean:          154.000 cm/s AV VTI:            0.314 m AV Peak Grad:      24.4 mmHg AV Mean Grad:      12.0 mmHg LVOT Vmax:         208.00 cm/s LVOT Vmean:        133.000 cm/s LVOT VTI:          0.323 m LVOT/AV VTI ratio: 1.03  AORTA Ao Root diam: 2.90 cm Ao Asc  diam:  3.50 cm  MITRAL VALVE                TRICUSPID VALVE MV Area (PHT): 3.19 cm     TR Peak grad:   40.2 mmHg MV Decel Time: 238 msec     TR Vmax:        317.00 cm/s MV E velocity: 116.00 cm/s MV A velocity: 213.00 cm/s  SHUNTS MV E/A ratio:  0.54         Systemic VTI:  0.32 m Systemic Diam: 1.90 cm  Arvilla Meres MD Electronically signed by Arvilla Meres MD Signature Date/Time: 03/26/2022/4:19:00 PM    Final             Risk Assessment/Calculations:     Lab Results  Component Value Date   WBC 5.7 09/27/2022   HGB 12.1 09/27/2022   HCT 37.8 09/27/2022   MCV 81.1 09/27/2022   PLT 277.0 09/27/2022   Lab Results  Component Value Date   CREATININE 1.05 02/12/2023   BUN 10 02/12/2023   NA 140 02/12/2023   K 4.4 02/12/2023   CL 106 02/12/2023   CO2 27 02/12/2023   Lab Results  Component Value Date   ALT 9 02/12/2023   AST 16 02/12/2023   ALKPHOS 77 02/12/2023   BILITOT 0.3 02/12/2023   Lab Results  Component Value Date   CHOL 194 09/27/2022   HDL 91.60 09/27/2022   LDLCALC 92 09/27/2022   TRIG 54.0 09/27/2022   CHOLHDL 2 09/27/2022    Lab Results  Component Value Date   HGBA1C 6.1 09/27/2022     Assessment & Plan    1.HOCM: -Abnormal LVOT gradient seen on 2D echo completed 02/2020 -We will plan  to update 2D echo for evaluation and surveillance of LVOT -Continue blood pressure control with telmisartan 20 mg and amlodipine 5 mg  2.  Essential hypertension: -Patient's blood pressure today was initially elevated at 152/70 and was 136/72 on recheck -Continue amlodipine 5 mg and telmisartan 20 mg  3.  Lower extremity edema: -Patient recently seen in the ED with complaint of lower extremity swelling and pain and underwent lower extremity ultrasound that was negative for DVT bilaterally.  4.  Hyperlipidemia: -Patient's LDL cholesterol was 92 -Continue Lipitor 20 mg daily  5.  DM type II: -Patient's last hemoglobin A1c was controlled at  6.1 -Continue lifestyle modification and management per PCP   Disposition: Follow-up with Verne Carrow, MD or APP in 12 months    Medication Adjustments/Labs and Tests Ordered: Current medicines are reviewed at length with the patient today.  Concerns regarding medicines are outlined above.   Signed, Napoleon Form, Leodis Rains, NP 03/11/2023, 7:59 AM [facility]

## 2023-03-12 ENCOUNTER — Ambulatory Visit: Payer: Medicare HMO | Admitting: Nurse Practitioner

## 2023-03-12 ENCOUNTER — Ambulatory Visit: Payer: Medicare HMO | Attending: Nurse Practitioner | Admitting: Nurse Practitioner

## 2023-03-12 ENCOUNTER — Encounter: Payer: Self-pay | Admitting: Nurse Practitioner

## 2023-03-12 VITALS — BP 136/72 | HR 75 | Ht 62.0 in | Wt 150.0 lb

## 2023-03-12 DIAGNOSIS — I5032 Chronic diastolic (congestive) heart failure: Secondary | ICD-10-CM | POA: Diagnosis not present

## 2023-03-12 DIAGNOSIS — E1169 Type 2 diabetes mellitus with other specified complication: Secondary | ICD-10-CM

## 2023-03-12 DIAGNOSIS — I10 Essential (primary) hypertension: Secondary | ICD-10-CM | POA: Diagnosis not present

## 2023-03-12 DIAGNOSIS — E785 Hyperlipidemia, unspecified: Secondary | ICD-10-CM

## 2023-03-12 DIAGNOSIS — I421 Obstructive hypertrophic cardiomyopathy: Secondary | ICD-10-CM | POA: Diagnosis not present

## 2023-03-12 DIAGNOSIS — R600 Localized edema: Secondary | ICD-10-CM

## 2023-03-12 NOTE — Patient Instructions (Signed)
Medication Instructions:  Your physician recommends that you continue on your current medications as directed. Please refer to the Current Medication list given to you today. *If you need a refill on your cardiac medications before your next appointment, please call your pharmacy*   Lab Work: None ordered   Testing/Procedures: Your physician has requested that you have an echocardiogram. Echocardiography is a painless test that uses sound waves to create images of your heart. It provides your doctor with information about the size and shape of your heart and how well your heart's chambers and valves are working. This procedure takes approximately one hour. There are no restrictions for this procedure. Please do NOT wear cologne, perfume, aftershave, or lotions (deodorant is allowed). Please arrive 15 minutes prior to your appointment time.   Follow-Up: At [facility], you and your health needs are our priority.  As part of our continuing mission to provide you with exceptional heart care, we have created designated Provider Care Teams.  These Care Teams include your primary Cardiologist (physician) and Advanced Practice Providers (APPs -  Physician Assistants and Nurse Practitioners) who all work together to provide you with the care you need, when you need it.  We recommend signing up for the patient portal called "MyChart".  Sign up information is provided on this After Visit Summary.  MyChart is used to connect with patients for Virtual Visits (Telemedicine).  Patients are able to view lab/test results, encounter notes, upcoming appointments, etc.  Non-urgent messages can be sent to your provider as well.   To learn more about what you can do with MyChart, go to [email or web address].    Your next appointment:   12 month(s)  Provider:   Verne Carrow, MD     Other Instructions   GET A PAIR OF COMPRESSION HOSE AND WEAR THEM EVERYDAY WHILE YOU'RE WALKING  Check your  blood pressure daily for 2 weeks, then contact the office with your readings.

## 2023-03-28 ENCOUNTER — Telehealth: Payer: Self-pay | Admitting: Cardiovascular Disease

## 2023-03-28 NOTE — Telephone Encounter (Signed)
Pt c/o BP issue: STAT if pt c/o blurred vision, one-sided weakness or slurred speech  1. What are your last 5 BP readings?  6/05: 125/68 63, 137/61 70 6/06: 133/65 67, 123/59 68 6/07: 123/68 69, 143/66 80 6/08: 116/61 80, 132/62 75 6/09: 124/65 63, 139/64 74 6/10: 119/66 64, 136/65 71 6/11: 133/63 65, 135/61 70 6/12: 123/63 65 6/13: 128/66 65 6/14: 128/65 68, 122/70 67 6/15: 123/67 71, 137/64 69 6/16: 126/53 64 6/17: 115/62 67, 122/61 70 6/18: 131/68 64, 129/65 66 6/19: 124/69 69, 121/61 65  2. Are you having any other symptoms (ex. Dizziness, headache, blurred vision, passed out)?  No   3. What is your BP issue?  patient

## 2023-03-28 NOTE — Telephone Encounter (Signed)
Called the pt and explained Alden Server, NP recommendations:  Please let patient know blood pressures are stable and no further medication adjustments or changes needed at this time.  Please continue to monitor your blood pressure and contact our office if having further questions.   Pt voiced understanding.

## 2023-03-29 ENCOUNTER — Encounter: Payer: Self-pay | Admitting: Internal Medicine

## 2023-03-29 ENCOUNTER — Ambulatory Visit (INDEPENDENT_AMBULATORY_CARE_PROVIDER_SITE_OTHER): Payer: Medicare HMO | Admitting: Internal Medicine

## 2023-03-29 VITALS — BP 138/68 | HR 68 | Temp 98.2°F | Ht 62.0 in | Wt 148.0 lb

## 2023-03-29 DIAGNOSIS — E118 Type 2 diabetes mellitus with unspecified complications: Secondary | ICD-10-CM

## 2023-03-29 DIAGNOSIS — I10 Essential (primary) hypertension: Secondary | ICD-10-CM

## 2023-03-29 DIAGNOSIS — I700 Atherosclerosis of aorta: Secondary | ICD-10-CM | POA: Diagnosis not present

## 2023-03-29 DIAGNOSIS — Z7984 Long term (current) use of oral hypoglycemic drugs: Secondary | ICD-10-CM | POA: Diagnosis not present

## 2023-03-29 MED ORDER — TRIAMCINOLONE ACETONIDE 0.5 % EX OINT: 1.0000 | TOPICAL_OINTMENT | Freq: Two times a day (BID) | CUTANEOUS | 0 refills | Status: AC

## 2023-03-29 NOTE — Progress Notes (Signed)
   Subjective:   Patient ID: Jenny Madden, female    DOB: [birth year], 82 y.o.   MRN: [id number]  HPI The patient is an 82 YO female coming in for follow up. Doing well overall  Review of Systems  Constitutional: Negative.   HENT: Negative.    Eyes: Negative.   Respiratory:  Negative for cough, chest tightness and shortness of breath.   Cardiovascular:  Negative for chest pain, palpitations and leg swelling.  Gastrointestinal:  Negative for abdominal distention, abdominal pain, constipation, diarrhea, nausea and vomiting.  Musculoskeletal: Negative.   Skin: Negative.   Neurological: Negative.   Psychiatric/Behavioral: Negative.      Objective:  Physical Exam Constitutional:      Appearance: She is well-developed.  HENT:     Head: Normocephalic and atraumatic.  Cardiovascular:     Rate and Rhythm: Normal rate and regular rhythm.  Pulmonary:     Effort: Pulmonary effort is normal. No respiratory distress.     Breath sounds: Normal breath sounds. No wheezing or rales.  Abdominal:     General: Bowel sounds are normal. There is no distension.     Palpations: Abdomen is soft.     Tenderness: There is no abdominal tenderness. There is no rebound.  Musculoskeletal:     Cervical back: Normal range of motion.  Skin:    General: Skin is warm and dry.  Neurological:     Mental Status: She is alert and oriented to person, place, and time.     Coordination: Coordination normal.     Vitals:   03/29/23 1039  BP: 138/68  Pulse: 68  Temp: 98.2 F (36.8 C)  TempSrc: Oral  SpO2: 99%  Weight: 148 lb (67.1 kg)  Height: 5\' 2"  (1.575 m)    Assessment & Plan:  Visit time 15 minutes in face to face communication with patient and coordination of care, additional 5 minutes spent in record review, coordination or care, ordering tests, communicating/referring to other healthcare professionals, documenting in medical records all on the same day of the visit for total time 20 minutes  spent on the visit.

## 2023-03-29 NOTE — Assessment & Plan Note (Signed)
Taking aspirin and statin. Will continue no changes today.

## 2023-03-29 NOTE — Assessment & Plan Note (Signed)
Due for HgA1c in Dec 2024. Overall diet controlled and on statin and ARB.

## 2023-03-29 NOTE — Patient Instructions (Signed)
We do not need labs today.    

## 2023-03-29 NOTE — Assessment & Plan Note (Signed)
BP at goal with her telmisartan 20 mg daily and amlodipine 5 mg daily. Recent labs stable will continue same and recheck CMP in Dec 2024.

## 2023-04-16 ENCOUNTER — Ambulatory Visit (HOSPITAL_COMMUNITY): Payer: Medicare HMO | Attending: Nurse Practitioner

## 2023-04-16 DIAGNOSIS — E1169 Type 2 diabetes mellitus with other specified complication: Secondary | ICD-10-CM | POA: Insufficient documentation

## 2023-04-16 DIAGNOSIS — I421 Obstructive hypertrophic cardiomyopathy: Secondary | ICD-10-CM | POA: Insufficient documentation

## 2023-04-16 DIAGNOSIS — I5032 Chronic diastolic (congestive) heart failure: Secondary | ICD-10-CM | POA: Diagnosis not present

## 2023-04-16 DIAGNOSIS — R600 Localized edema: Secondary | ICD-10-CM | POA: Insufficient documentation

## 2023-04-16 DIAGNOSIS — E785 Hyperlipidemia, unspecified: Secondary | ICD-10-CM | POA: Insufficient documentation

## 2023-04-16 DIAGNOSIS — I10 Essential (primary) hypertension: Secondary | ICD-10-CM | POA: Insufficient documentation

## 2023-04-16 LAB — ECHOCARDIOGRAM COMPLETE
Area-P 1/2: 2.69 cm2
MV M vel: 5.24 m/s
MV Peak grad: 109.8 mmHg
S' Lateral: 2 cm

## 2023-04-18 ENCOUNTER — Telehealth: Payer: Self-pay | Admitting: Cardiovascular Disease

## 2023-04-18 DIAGNOSIS — I5032 Chronic diastolic (congestive) heart failure: Secondary | ICD-10-CM

## 2023-04-18 DIAGNOSIS — I421 Obstructive hypertrophic cardiomyopathy: Secondary | ICD-10-CM

## 2023-04-18 NOTE — Telephone Encounter (Signed)
Pt advised her Echo results and verbalized understanding and will prepare to have it repeated in one year.   Order placed.

## 2023-04-18 NOTE — Telephone Encounter (Signed)
Patient is calling requesting her echo results. Please advise.  

## 2023-04-20 ENCOUNTER — Other Ambulatory Visit: Payer: Self-pay | Admitting: Internal Medicine

## 2023-05-23 ENCOUNTER — Ambulatory Visit: Payer: Medicare HMO | Admitting: Internal Medicine

## 2023-06-06 DIAGNOSIS — H524 Presbyopia: Secondary | ICD-10-CM | POA: Diagnosis not present

## 2023-06-06 DIAGNOSIS — H52223 Regular astigmatism, bilateral: Secondary | ICD-10-CM | POA: Diagnosis not present

## 2023-07-04 ENCOUNTER — Ambulatory Visit: Payer: Medicare HMO

## 2023-07-04 DIAGNOSIS — Z23 Encounter for immunization: Secondary | ICD-10-CM

## 2023-07-04 NOTE — Progress Notes (Signed)
Patient presented in the office today for her HD Flu Vaccine. HD Flu Vaccine was administered into her Left Deltoid Muscle. Patient tolerated her injection well and the injection site looked well. Patient was advised to repot to the office immediately if she notices any adverse reactions.

## 2023-07-19 ENCOUNTER — Other Ambulatory Visit: Payer: Self-pay | Admitting: Internal Medicine

## 2023-07-23 ENCOUNTER — Ambulatory Visit: Payer: Medicare HMO | Admitting: Gastroenterology

## 2023-07-24 ENCOUNTER — Ambulatory Visit: Payer: Medicare HMO | Admitting: Family Medicine

## 2023-07-24 ENCOUNTER — Encounter: Payer: Self-pay | Admitting: Family Medicine

## 2023-07-24 VITALS — BP 150/72 | HR 78 | Temp 97.8°F | Ht 62.0 in | Wt 144.0 lb

## 2023-07-24 DIAGNOSIS — R3915 Urgency of urination: Secondary | ICD-10-CM | POA: Diagnosis not present

## 2023-07-24 DIAGNOSIS — R350 Frequency of micturition: Secondary | ICD-10-CM

## 2023-07-24 LAB — POC URINALSYSI DIPSTICK (AUTOMATED)
Bilirubin, UA: NEGATIVE
Blood, UA: NEGATIVE
Glucose, UA: NEGATIVE
Ketones, UA: NEGATIVE
Nitrite, UA: NEGATIVE
Protein, UA: NEGATIVE
Spec Grav, UA: 1.015 (ref 1.010–1.025)
Urobilinogen, UA: 0.2 U/dL
pH, UA: 6.5 (ref 5.0–8.0)

## 2023-07-24 NOTE — Patient Instructions (Signed)
Stay hydrated.   We will be in touch with your urine culture results.   Let us know if your symptoms return.

## 2023-07-24 NOTE — Progress Notes (Signed)
Subjective:  Jenny Madden is a 82 y.o. female who complains of possible urinary tract infection.  She has had symptoms for 2 weeks but improving and essentially back to baseline this week.   Symptoms include  urinary frequency and suprapubic pressure Patient denies  fever, chills, back pain, N/V/D .  No dysuria or hematuria. Last UTI was last year.  Recent sexual activity.   States blood sugars are in low 100 range.     Past Medical History:  Diagnosis Date   Allergic rhinitis, cause unspecified    Anxiety state, unspecified    none recent   Benign neoplasm of colon    Cancer (HCC)    renal mass   Cataract    Chronic diastolic heart failure (HCC) 03/26/2022   Chronic kidney disease 01/2016   Tumor left side   Constipation 10/19/2015   Diverticulosis of colon (without mention of hemorrhage)    Dysphagia    Family history of adverse reaction to anesthesia 3 yrs ago   slow to awaken    Family history of malignant neoplasm of gastrointestinal tract    Heart murmur    Hypertension    Nonspecific (abnormal) findings on radiological and other examination of other intrathoracic organs    Nonspecific abnormal electrocardiogram (ECG) (EKG)    Osteopenia 12/27/2011   Osteoporosis    Other dysphagia    Pure hypercholesterolemia    Type II or unspecified type diabetes mellitus without mention of complication, not stated as uncontrolled    Unspecified essential hypertension    Unspecified tinnitus years ago   Vitamin D deficiency 12/27/2011    ROS as in subjective  Reviewed allergies, medications, past medical, surgical, and social history.    Objective: Vitals:   07/24/23 1018  BP: (!) 150/72  Pulse: 78  Temp: 97.8 F (36.6 C)  SpO2: 97%    General appearance: alert, no distress, WD/WN, female Abdomen: +bs, soft, non tender, non distended Back: no CVA tenderness      Laboratory:  Urine dipstick: trace for leukocyte esterase.       Assessment: Urinary frequency -  Plan: POCT Urinalysis Dipstick (Automated), Urine Culture  Urinary urgency - Plan: Urine Culture   Plan: Feels essentially back to baseline.  Antibiotic not prescribed. Advised increased water intake, can use OTC Tylenol for pain.    Advised to urinate after sexual activity.  Blood sugars well controlled at home.   Urine culture sent.   Call or return if symptoms return.

## 2023-07-25 LAB — URINE CULTURE: Result:: NO GROWTH

## 2023-08-02 ENCOUNTER — Other Ambulatory Visit: Payer: Self-pay | Admitting: Internal Medicine

## 2023-09-18 ENCOUNTER — Other Ambulatory Visit: Payer: Self-pay

## 2023-09-18 ENCOUNTER — Telehealth: Payer: Self-pay | Admitting: Internal Medicine

## 2023-09-18 MED ORDER — ONETOUCH DELICA PLUS LANCET33G MISC: 1 refills | Status: DC

## 2023-09-18 NOTE — Telephone Encounter (Signed)
Prescription Request  09/18/2023  LOV: 03/29/2023  What is the name of the medication or equipment? One Touch Ultra lancets   Have you contacted your pharmacy to request a refill? Yes   Which pharmacy would you like this sent to?  [facility] - [place], [place] - [address] [facility] [address] Portal to Reg[facility] [address] [place] [address] Phone: [phone] Fax: [phone]   Patient notified that their request is being sent to the clinical staff for review and that they should receive a response within 2 business days.   Please advise at Mobile [phone] (mobile)

## 2023-09-18 NOTE — Telephone Encounter (Signed)
Done

## 2023-10-01 ENCOUNTER — Encounter: Payer: Medicare HMO | Admitting: Internal Medicine

## 2023-10-14 ENCOUNTER — Ambulatory Visit (INDEPENDENT_AMBULATORY_CARE_PROVIDER_SITE_OTHER): Payer: Medicare (Managed Care) | Admitting: Internal Medicine

## 2023-10-14 ENCOUNTER — Encounter: Payer: Self-pay | Admitting: Internal Medicine

## 2023-10-14 VITALS — BP 134/66 | HR 82 | Temp 98.5°F | Ht 62.0 in | Wt 144.0 lb

## 2023-10-14 DIAGNOSIS — E118 Type 2 diabetes mellitus with unspecified complications: Secondary | ICD-10-CM

## 2023-10-14 DIAGNOSIS — I5032 Chronic diastolic (congestive) heart failure: Secondary | ICD-10-CM

## 2023-10-14 DIAGNOSIS — D649 Anemia, unspecified: Secondary | ICD-10-CM

## 2023-10-14 DIAGNOSIS — I10 Essential (primary) hypertension: Secondary | ICD-10-CM

## 2023-10-14 DIAGNOSIS — E1169 Type 2 diabetes mellitus with other specified complication: Secondary | ICD-10-CM

## 2023-10-14 DIAGNOSIS — K222 Esophageal obstruction: Secondary | ICD-10-CM | POA: Diagnosis not present

## 2023-10-14 DIAGNOSIS — E785 Hyperlipidemia, unspecified: Secondary | ICD-10-CM

## 2023-10-14 DIAGNOSIS — Z0000 Encounter for general adult medical examination without abnormal findings: Secondary | ICD-10-CM | POA: Diagnosis not present

## 2023-10-14 DIAGNOSIS — K2100 Gastro-esophageal reflux disease with esophagitis, without bleeding: Secondary | ICD-10-CM

## 2023-10-14 DIAGNOSIS — I700 Atherosclerosis of aorta: Secondary | ICD-10-CM | POA: Diagnosis not present

## 2023-10-14 LAB — LIPID PANEL
Cholesterol: 181 mg/dL (ref 0–200)
HDL: 74.4 mg/dL (ref >39.00)
LDL Cholesterol: 97 mg/dL (ref 0–99)
NonHDL: 106.64
Total CHOL/HDL Ratio: 2
Triglycerides: 50 mg/dL (ref 0.0–149.0)
VLDL: 10 mg/dL (ref 0.0–40.0)

## 2023-10-14 LAB — COMPREHENSIVE METABOLIC PANEL
ALT: 11 U/L (ref 0–35)
AST: 20 U/L (ref 0–37)
Albumin: 4.5 g/dL (ref 3.5–5.2)
Alkaline Phosphatase: 79 U/L (ref 39–117)
BUN: 15 mg/dL (ref 6–23)
CO2: 25 meq/L (ref 19–32)
Calcium: 9.8 mg/dL (ref 8.4–10.5)
Chloride: 106 meq/L (ref 96–112)
Creatinine, Ser: 1.1 mg/dL (ref 0.40–1.20)
GFR: 46.58 mL/min — ABNORMAL LOW (ref >60.00)
Glucose, Bld: 81 mg/dL (ref 70–99)
Potassium: 4 meq/L (ref 3.5–5.1)
Sodium: 141 meq/L (ref 135–145)
Total Bilirubin: 0.3 mg/dL (ref 0.2–1.2)
Total Protein: 7.8 g/dL (ref 6.0–8.3)

## 2023-10-14 LAB — CBC
HCT: 38.6 % (ref 36.0–46.0)
Hemoglobin: 12.4 g/dL (ref 12.0–15.0)
MCHC: 32.2 g/dL (ref 30.0–36.0)
MCV: 82 fL (ref 78.0–100.0)
Platelets: 252 10*3/uL (ref 150.0–400.0)
RBC: 4.71 Mil/uL (ref 3.87–5.11)
RDW: 14.3 % (ref 11.5–15.5)
WBC: 4.9 10*3/uL (ref 4.0–10.5)

## 2023-10-14 LAB — MICROALBUMIN / CREATININE URINE RATIO
Creatinine,U: 14.7 mg/dL
Microalb Creat Ratio: 4.8 mg/g (ref 0.0–30.0)
Microalb, Ur: <0.7 mg/dL (ref 0.0–1.9)

## 2023-10-14 LAB — TSH: TSH: 1.92 u[IU]/mL (ref 0.35–5.50)

## 2023-10-14 LAB — HEMOGLOBIN A1C: Hgb A1c MFr Bld: 6.2 % (ref 4.6–6.5)

## 2023-10-14 NOTE — Assessment & Plan Note (Signed)
Checking lipid panel and adjust lipitor 40 mg daily as needed. 

## 2023-10-14 NOTE — Assessment & Plan Note (Signed)
 Flu shot complete for season. Pneumonia complete. Shingrix due at pharmacy. Tetanus due 2031. Colonoscopy aged out. Mammogram aged out, pap smear aged out and dexa complete. Counseled about sun safety and mole surveillance. Counseled about the dangers of distracted driving. Given 10 year screening recommendations.

## 2023-10-14 NOTE — Assessment & Plan Note (Signed)
 Checking CBC and adjust as needed.

## 2023-10-14 NOTE — Assessment & Plan Note (Signed)
 With recurrence of dysphagia and needs referral back to GI as her provider has retired. Past dilation. Continue protonix 40 mg daily and pepcid 40 mg at bedtime for now.

## 2023-10-14 NOTE — Assessment & Plan Note (Signed)
 Taking lipitor 40 mg daily and will continue.

## 2023-10-14 NOTE — Assessment & Plan Note (Signed)
 No flare today and not on fluid pill. Noted on echo previously and discussed with her today. Continue.

## 2023-10-14 NOTE — Assessment & Plan Note (Signed)
 Foot exam done, checking HgA1c, lipid panel and microalbumin to creatinine urine ratio and CMP. BP at goal. Taking statin and ARB and diet controlled. Adjust as needed.

## 2023-10-14 NOTE — Progress Notes (Signed)
 Subjective:   Patient ID: Jenny Madden, female    DOB: 01/26/1941, 83 y.o.   MRN: [id number]  HPI Here for medicare wellness and physical, no new complaints. Please see A/P for status and treatment of chronic medical problems.   Diet: DM since diabetic Physical activity: active and walks Depression/mood screen: negative Hearing: intact to whispered voice Visual acuity: grossly normal, performs annual eye exam  ADLs: capable Fall risk: none Home safety: good Cognitive evaluation: intact to orientation, naming, recall and repetition EOL planning: adv directives discussed, in place  [employer] Visit from 03/29/2023 in [facility] [place]  PHQ-2 Total Score 0       Flowsheet Row Office Visit from 03/29/2023 in [facility]  PHQ-9 Total Score 0         07/27/2022    1:13 PM 09/27/2022   10:03 AM 01/28/2023   10:21 AM 03/29/2023   10:42 AM 10/14/2023   10:09 AM  Fall Risk  Falls in the past year? 0 0 0 0 0  Was there an injury with Fall? 0 0 0 0 0  Fall Risk Category Calculator 0 0 0 0 0  Fall Risk Category (Retired) Low Low     (RETIRED) Patient Fall Risk Level Low fall risk      Patient at Risk for Falls Due to No Fall Risks  No Fall Risks    Fall risk Follow up Falls prevention discussed Falls evaluation completed Falls evaluation completed Falls evaluation completed Falls evaluation completed    I have personally reviewed and have noted 1. The patient's medical and social history - reviewed today no changes 2. Their use of alcohol, tobacco or illicit drugs 3. Their current medications and supplements 4. The patient's functional ability including ADL's, fall risks, home safety risks and hearing or visual impairment. 5. Diet and physical activities 6. Evidence for depression or mood disorders 7. Care team reviewed and updated 8.  The patient is not on an opioid pain medication.  Patient Care  Team: Rollene Almarie LABOR, MD as PCP - General (Internal Medicine) Verlin Lonni BIRCH, MD as PCP - Cardiology (Cardiology) Teressa Toribio SQUIBB, MD as Attending Physician (Gastroenterology) Lelon JONELLE Ferrari, MD as Referring Physician (Ophthalmology) Douglass Delon CROME, RMA Foltanski, Morna SAILOR, [place] (Inactive) as Pharmacist (Pharmacist) [place] Blonder, MD as Consulting Physician (Obstetrics and Gynecology) Cleatus Collar, MD as Consulting Physician (Ophthalmology) Past Medical History:  Diagnosis Date   Allergic rhinitis, cause unspecified    Anxiety state, unspecified    none recent   Benign neoplasm of colon    Cancer [facility])    renal mass   Cataract    Chronic diastolic heart failure (HCC) 03/26/2022   Chronic kidney disease 01/2016   Tumor left side   Constipation 10/19/2015   Diverticulosis of colon (without mention of hemorrhage)    Dysphagia    Family history of adverse reaction to anesthesia 3 yrs ago   slow to awaken    Family history of malignant neoplasm of gastrointestinal tract    Heart murmur    Hypertension    Nonspecific (abnormal) findings on radiological and other examination of other intrathoracic organs    Nonspecific abnormal electrocardiogram (ECG) (EKG)    Osteopenia 12/27/2011   Osteoporosis    Other dysphagia    Pure hypercholesterolemia    Type II or unspecified type diabetes mellitus without mention of complication, not stated as uncontrolled  Unspecified essential hypertension    Unspecified tinnitus years ago   Vitamin D  deficiency 12/27/2011   Past Surgical History:  Procedure Laterality Date   CATARACT EXTRACTION     CESAREAN SECTION     x 1   COLONOSCOPY     colonscopy and endoscopy  5 yrs ago   FOOT SURGERY     bilateral for hammer toes   IR GENERIC HISTORICAL  12/06/2015   IR RADIOLOGIST EVAL & MGMT 12/06/2015 Marcey Moan, MD [facility] INTERV RAD   IR GENERIC HISTORICAL  05/03/2016   IR RADIOLOGIST EVAL & MGMT 05/03/2016 Marcey Moan,  MD [facility] INTERV RAD   IR GENERIC HISTORICAL  02/09/2016   IR RADIOLOGIST EVAL & MGMT 02/09/2016 [facility] INTERV RAD   IR RADIOLOGIST EVAL & MGMT  01/09/2017   IR RADIOLOGIST EVAL & MGMT  01/14/2018   IR RADIOLOGIST EVAL & MGMT  03/31/2019   IR RADIOLOGIST EVAL & MGMT  03/30/2020   LAPAROTOMY N/A 03/27/2022   Procedure: EXPLORATORY LAPAROTOMY, POSSIBLE SMALL BOWEL RESECTION;  Surgeon: Ebbie Cough, MD;  Location: [facility];  Service: General;  Laterality: N/A;   RADIOFREQUENCY ABLATION KIDNEY Left 01/2016   Renal mass   UPPER GASTROINTESTINAL ENDOSCOPY     Family History  Problem Relation Age of Onset   Colon cancer Brother 32   Esophageal cancer Neg Hx    Rectal cancer Neg Hx    Stomach cancer Neg Hx    Review of Systems  Constitutional: Negative.   HENT: Negative.    Eyes: Negative.   Respiratory:  Negative for cough, chest tightness and shortness of breath.   Cardiovascular:  Negative for chest pain, palpitations and leg swelling.  Gastrointestinal:  Negative for abdominal distention, abdominal pain, constipation, diarrhea, nausea and vomiting.  Musculoskeletal: Negative.   Skin: Negative.   Neurological: Negative.   Psychiatric/Behavioral: Negative.      Objective:  Physical Exam Constitutional:      Appearance: She is well-developed.  HENT:     Head: Normocephalic and atraumatic.  Cardiovascular:     Rate and Rhythm: Normal rate and regular rhythm.  Pulmonary:     Effort: Pulmonary effort is normal. No respiratory distress.     Breath sounds: Normal breath sounds. No wheezing or rales.  Abdominal:     General: Bowel sounds are normal. There is no distension.     Palpations: Abdomen is soft.     Tenderness: There is no abdominal tenderness. There is no rebound.  Musculoskeletal:     Cervical back: Normal range of motion.  Skin:    General: Skin is warm and dry.     Comments: Foot exam done  Neurological:     Mental Status: She is alert and oriented to person, place,  and time.     Coordination: Coordination normal.    Vitals:   10/14/23 1006 10/14/23 1008 10/14/23 1025  BP: (!) 158/82 (!) 158/82 134/66  Pulse: 82    Temp: 98.5 F (36.9 C)    TempSrc: Oral    SpO2: 98%    Weight: 144 lb (65.3 kg)    Height: 5' 2 (1.575 m)      Assessment & Plan:

## 2023-10-14 NOTE — Assessment & Plan Note (Signed)
 BP at goal on amlodipine 5 mg daily and telmisartan 20 mg daily. Checking CMP and adjust as needed.

## 2023-10-14 NOTE — Assessment & Plan Note (Signed)
 Referral back to GI for EGD as she is having some recurrence of dysphagia.

## 2023-10-14 NOTE — Patient Instructions (Signed)
 We will check the thyroid levels and get you back in with GI.

## 2023-10-17 ENCOUNTER — Other Ambulatory Visit: Payer: Self-pay | Admitting: Internal Medicine

## 2023-10-21 ENCOUNTER — Other Ambulatory Visit: Payer: Self-pay | Admitting: Internal Medicine

## 2023-10-21 ENCOUNTER — Encounter: Payer: Medicare HMO | Admitting: Internal Medicine

## 2023-10-21 ENCOUNTER — Telehealth: Payer: Self-pay

## 2023-10-21 MED ORDER — POLYETHYLENE GLYCOL 3350 17 GM/SCOOP PO POWD: 17.0000 g | Freq: Two times a day (BID) | ORAL | 1 refills | Status: DC | PRN

## 2023-10-21 MED ORDER — PANTOPRAZOLE SODIUM 40 MG PO TBEC: DELAYED_RELEASE_TABLET | ORAL | 3 refills | Status: DC

## 2023-10-21 NOTE — Telephone Encounter (Signed)
 Copied from CRM [id number]. Topic: Clinical - Prescription Issue >> Oct 21, 2023  1:38 PM [place] A wrote: Reason for CRM: pantoprazole (PROTONIX) 40 MG tablet Patient said she does not know if the old insurance or new insurance will cover her medication

## 2023-10-21 NOTE — Telephone Encounter (Signed)
 Copied from CRM [id number]. Topic: Clinical - Medication Refill >> Oct 21, 2023 10:59 AM Franky GRADE wrote: Most Recent Primary Care Visit:  Provider: ROLLENE NORRIS A  Department: [facility]  Visit Type: PHYSICAL  Date: 10/14/2023  Medication: pantoprazole  (PROTONIX ) 40 MG tablet, since she only has two days left she was hoping [facility].   Has the patient contacted their pharmacy? Yes, the pharmacy faxed a request for refill.  (Agent: If no, request that the patient contact the pharmacy for the refill. If patient does not wish to contact the pharmacy document the reason why and proceed with request.) (Agent: If yes, when and what did the pharmacy advise?)  Is this the correct pharmacy for this prescription? Yes If no, delete pharmacy and type the correct one.  This is the patient's preferred pharmacy:   [facility] - [place], [place] - [address] [place] [address] Phone: [phone] Fax: [phone]    Has the prescription been filled recently? No  Is the patient out of the medication? No, she has enough for 2 days.   Has the patient been seen for an appointment in the last year OR does the patient have an upcoming appointment? Yes  Can we respond through MyChart? No, phone call.  Agent: Please be advised that Rx refills may take up to 3 business days. We ask that you follow-up with your pharmacy.

## 2023-10-21 NOTE — Telephone Encounter (Signed)
I have sent in medication.

## 2023-10-22 NOTE — Telephone Encounter (Signed)
 This is not a colon prep this is an otc med so appropriate rx was sent

## 2023-10-23 NOTE — Telephone Encounter (Signed)
 Copied from CRM [id number]. Topic: Clinical - Prescription Issue >> Oct 22, 2023  3:27 PM Trula Gable C wrote: Reason for EAV:[id number] called in regarding her medication pantoprazole  (PROTONIX ) 40 MG tablet explained [facility] and [facility] like it to be sent [facility], patient is requesting for that to be sent now along with the rest of her medication for now on

## 2023-10-24 ENCOUNTER — Encounter: Payer: Self-pay | Admitting: Nurse Practitioner

## 2023-10-29 ENCOUNTER — Other Ambulatory Visit: Payer: Self-pay | Admitting: Internal Medicine

## 2023-10-29 MED ORDER — ATORVASTATIN CALCIUM 20 MG PO TABS: 20.0000 mg | ORAL_TABLET | Freq: Every day | ORAL | 1 refills | Status: DC

## 2023-10-29 MED ORDER — FAMOTIDINE 40 MG PO TABS: 40.0000 mg | ORAL_TABLET | Freq: Every day | ORAL | 3 refills | Status: DC

## 2023-10-29 MED ORDER — PANTOPRAZOLE SODIUM 40 MG PO TBEC: DELAYED_RELEASE_TABLET | ORAL | 3 refills | Status: DC

## 2023-10-29 MED ORDER — TELMISARTAN 20 MG PO TABS: 20.0000 mg | ORAL_TABLET | Freq: Every day | ORAL | 3 refills | Status: DC

## 2023-10-29 MED ORDER — AMLODIPINE BESYLATE 5 MG PO TABS: 5.0000 mg | ORAL_TABLET | Freq: Every day | ORAL | 3 refills | Status: DC

## 2023-10-29 NOTE — Telephone Encounter (Signed)
Copied from CRM [id number]. Topic: Clinical - Medication Refill >> Oct 29, 2023 11:32 AM Orinda Kenner C wrote: Most Recent Primary Care Visit:  Provider: Hillard Danker A  Department: [facility]  Visit Type: PHYSICAL  Date: 10/14/2023  Medication: atorvastatin (LIPITOR) 20 MG tablet 1 tablet daily pantoprazole (PROTONIX) 40 MG tablet 2 tablets twice daily famotidine (PEPCID) 40 MG tablet 1 tablet daily amLODipine (NORVASC) 5 MG tablet 1 tablet daily telmisartan (MICARDIS) 20 MG tablet  1 tablet daily   Has the patient contacted their pharmacy? Yes, pharmacy has not respond and have patient to reached out. Patient changed insurance and new pharmacy is [facility], not to [facility] pharmacy please. Information was givent to the office during the office 10/14/23. (Agent: If no, request that the patient contact the pharmacy for the refill. If patient does not wish to contact the pharmacy document the reason why and proceed with request.) (Agent: If yes, when and what did the pharmacy advise?)  Is this the correct pharmacy for this prescription? Yes If no, delete pharmacy and type the correct one.  This is the patient's preferred pharmacy:  [facility] - [place], [place] - [address] [place] [address] Phone: [phone] Fax: [phone]   Has the prescription been filled recently? No  Is the patient out of the medication? Yes  Has the patient been seen for an appointment in the last year OR does the patient have an upcoming appointment? Yes  Can we respond through MyChart? No. Please call back at [phone] that the correction was done.   Agent: Please be advised that Rx refills may take up to 3 business days. We ask that you follow-up with your pharmacy.

## 2023-10-31 ENCOUNTER — Other Ambulatory Visit: Payer: Self-pay | Admitting: Internal Medicine

## 2023-10-31 NOTE — Telephone Encounter (Signed)
Copied from CRM [id number]. Topic: Clinical - Medication Question >> Oct 31, 2023  1:47 PM Clayton Bibles wrote: Reason for CRM: Tobin wants to know if she should take polyethylene glycol powder (GAVILAX) 17 GM/SCOOP powder because she has chronic kidney disease  Please call back at [phone]

## 2023-11-21 ENCOUNTER — Ambulatory Visit: Payer: Medicare (Managed Care) | Admitting: Nurse Practitioner

## 2023-11-21 ENCOUNTER — Encounter: Payer: Self-pay | Admitting: Nurse Practitioner

## 2023-11-21 VITALS — BP 120/64 | HR 79 | Ht 62.0 in | Wt 146.0 lb

## 2023-11-21 DIAGNOSIS — K5900 Constipation, unspecified: Secondary | ICD-10-CM

## 2023-11-21 DIAGNOSIS — Z860101 Personal history of adenomatous and serrated colon polyps: Secondary | ICD-10-CM

## 2023-11-21 DIAGNOSIS — R1310 Dysphagia, unspecified: Secondary | ICD-10-CM

## 2023-11-21 NOTE — Patient Instructions (Signed)
Avoid eating large pieces of meat, bread and rice.  Continue Pantoprazole 40 mg twice daily.  Contact our office if swallowing difficulties recur.  Due to recent changes in healthcare laws, you may see the results of your imaging and laboratory studies on MyChart before your provider has had a chance to review them.  We understand that in some cases there may be results that are confusing or concerning to you. Not all laboratory results come back in the same time frame and the provider may be waiting for multiple results in order to interpret others.  Please give [place] 48 hours in order for your provider to thoroughly review all the results before contacting the office for clarification of your results.   Thank you for trusting me with your gastrointestinal care!   Alcide Evener, CRNP

## 2023-11-21 NOTE — Progress Notes (Signed)
11/21/2023 JAELEEN INZUNZA [id number] Jun 29, 1941   Chief Complaint: Difficulty swallowing beef   History of Present Illness: Jenny Madden. Scarbro is an 83 year old female with a past medical history of anxiety, hypertension, DM II, CKD, left renal mass s/p percutaneous cryoablation 2017 (most likely renal cell carcinoma, due to the relatively small size the neoplasm at the time of his treatment a biopsy could not be performed at the time of cryoablation by Dr. Fredia Sorrow), SBO 03/2022, GERD, dysphagia, Schatzki's ring and colon polyps. Previously followed by Dr. Christella Hartigan. She presents today for further evaluation regarding dysphagia which occurs only if she eats beef. On 10/02/2023, she ate beef for Christmas dinner and a piece became stuck to the upper mid esophagus. She was afraid to eat. She was able to drink water which passed down the esophagus without difficulty but felt like the beef was stuck for a few days despite eating small bites of food that passed down the esophagus. No further episodes of food getting stuck in the esophagus since then, she continues to avoid beef. No heartburn. No upper or lower abdominal pain. Her most recent EGD was 11/17/2021 which showed a benign esophageal stricture at he GE junction which was dilated and a small hiatal hernia. She does not wish to pursue an EGD at this time, will consider if she has any further dysphagia. She remains on Pantoprazole 40mg  every day and Famotidine 40mg  Q HS. She has intermittent constipation which is fairly well controlled by taking Miralax as needed and by eating prunes. Her most recent colonoscopy was 10/2016, two tubular adenomatous polyps were removed from the colon. No further colonoscopies were recommended due to age.      Latest Ref Rng & Units 10/14/2023   10:30 AM 09/27/2022   10:28 AM 06/19/2022    9:59 AM  CBC  WBC 4.0 - 10.5 K/uL 4.9  5.7  4.7   Hemoglobin 12.0 - 15.0 g/dL 69.6  29.5  28.4   Hematocrit 36.0 - 46.0 % 38.6   37.8  36.8   Platelets 150.0 - 400.0 K/uL 252.0  277.0  215.0        Latest Ref Rng & Units 10/14/2023   10:30 AM 02/12/2023   11:09 AM 09/27/2022   10:28 AM  CMP  Glucose 70 - 99 mg/dL 81  83  78   BUN 6 - 23 mg/dL 15  10  15    Creatinine 0.40 - 1.20 mg/dL 1.32  4.40  1.02   Sodium 135 - 145 mEq/L 141  140  139   Potassium 3.5 - 5.1 mEq/L 4.0  4.4  4.1   Chloride 96 - 112 mEq/L 106  106  104   CO2 19 - 32 mEq/L 25  27  27    Calcium 8.4 - 10.5 mg/dL 9.8  9.6  9.8   Total Protein 6.0 - 8.3 g/dL 7.8  7.5  7.9   Total Bilirubin 0.2 - 1.2 mg/dL 0.3  0.3  0.3   Alkaline Phos 39 - 117 U/L 79  77  81   AST 0 - 37 U/L 20  16  19    ALT 0 - 35 U/L 11  9  11      CTAP w/wo contrast 03/26/2022: FINDINGS: Lower chest: Chest basilar atelectasis noted both lower lobes.   Hepatobiliary: No suspicious focal abnormality within the liver parenchyma. There is no evidence for gallstones, gallbladder wall thickening, or pericholecystic fluid. No intrahepatic or extrahepatic biliary dilation.  Pancreas: No focal mass lesion. No dilatation of the main duct. No intraparenchymal cyst. No peripancreatic edema.   Spleen: No splenomegaly. No focal mass lesion.   Adrenals/Urinary Tract: No adrenal nodule or mass. Right kidney unremarkable. 5.8 cm simple cyst noted upper pole left kidney, similar to prior. No evidence for hydroureter. The urinary bladder shows no wall thickening although contour is distorted by dilated small bowel in the anterior right pelvis.   Stomach/Bowel: Stomach is mildly distended and fluid-filled. Duodenum is normally positioned as is the ligament of Treitz. Proximal small bowel unremarkable. Mid small bowel is dilated and fluid-filled, measuring up to 3 cm diameter. An abrupt transition zone is identified in the anterior right pelvis, immediately adjacent to the distorted urinary bladder (axial image 60/series 2). Small bowel distal to this location than become slightly  more distended before a second transition zone is identified in the anterior right lower quadrant on image 61/2. Small volume interloop mesenteric fluid identified in the region of the right lower quadrant and small bowel distal to this region is completely decompressed (see terminal ileum on image 41/2). The appendix is normal. No gross colonic mass. No colonic wall thickening. Diverticuli are seen scattered along the entire length of the colon without CT findings of diverticulitis.   Vascular/Lymphatic: There is moderate atherosclerotic calcification of the abdominal aorta without aneurysm. There is no gastrohepatic or hepatoduodenal ligament lymphadenopathy. No retroperitoneal or mesenteric lymphadenopathy. No pelvic sidewall lymphadenopathy.   Reproductive: Unremarkable.   Other: None.   Musculoskeletal: No worrisome lytic or sclerotic osseous abnormality. Degenerative disc disease noted lumbar spine.   IMPRESSION: 1. Findings consistent with small bowel obstruction with 2 apparent transition points identified in the anterior right pelvis. There is a small volume interloop mesenteric fluid in this region and small bowel distal to this area is completely decompressed. While appearance may be related to adhesions, internal hernia is not excluded. 2. Colonic diverticulosis without diverticulitis. 3. Aortic Atherosclerosis    ECHO 04/16/2023: Proximal septal thickening with chordal SAM; elevated LVOT gradient with valsalva (4.3 m/s). 1. Left ventricular ejection fraction, by estimation, is 70 to 75%. The left ventricle has hyperdynamic function. The left ventricle has no regional wall motion abnormalities. Left ventricular diastolic parameters are consistent with Grade I diastolic dysfunction (impaired relaxation). Elevated left atrial pressure. 2. Right ventricular systolic function is normal. The right ventricular size is normal. There is normal pulmonary artery systolic pressure.  3. 4. Left atrial size was severely dilated. The mitral valve is normal in structure. Trivial mitral valve regurgitation. No evidence of mitral stenosis. Moderate mitral annular calcification. 5. The aortic valve is tricuspid. Aortic valve regurgitation is not visualized. Aortic valve sclerosis is present, with no evidence of aortic valve stenosis. 6. Aortic dilatation noted. There is borderline dilatation of the ascending aorta, measuring 38 mm. 7. The inferior vena cava is normal in size with greater than 50% respiratory variability, suggesting right atrial pressure of 3 mmHg.   PAST GI PROCEDURES:  EGD 11/17/2021: - One benign-appearing, intrinsic stenosis was found at the gastroesophageal junction (thin Schatzki's ring) above a small hiatal hernia. I dilated the narrowing using a 20mm TTS balloon held inflated for 90 seconds.  - Small hiatal hernia.  - The examination was otherwise normal  EGD 12/21/2019: - Minor Schatzki's ring, dilated to 20mm. - The examination was otherwise normal.   EGD 01/10/2018 EGD by Dr. Christella Hartigan: - Thin Schatzkis' ring, dilated to 20mm. - Mild gastritis, biopsies showed chronic gastritis.  - The  examination was otherwise normal.   Colonoscopy 11/07/2016 by Dr. Christella Hartigan: - Two 5 to 8 mm tubular adenomatous polyps in the descending colon and in the ascending colon, removed with a cold snare. Resected and retrieved. - Diverticulosis in the left colon. - The examination was otherwise normal on direct and retroflexion views. -No further colon polyp surveillance colonoscopies recommended.  Letter sent to patient per Dr. Christella Hartigan as follows: At least one of the polyps removed during your recent procedure was proven to be adenomatous.Usually, routine screening/surveillance colonoscopy would be recommended to be done in 5 years.  However since colon cancer screening tests generally stop between ages 12 and 83, I will leave it to you and your primary care physician to contact my  office at that time if it is felt that colon cancer screening is still an important issue for you.  Current Outpatient Medications on File Prior to Visit  Medication Sig Dispense Refill   acetaminophen (TYLENOL) 325 MG tablet Take 2 tablets (650 mg total) by mouth every 6 (six) hours as needed for moderate pain. 30 tablet 0   amLODipine (NORVASC) 5 MG tablet Take 1 tablet (5 mg total) by mouth daily. 90 tablet 3   aspirin 81 MG tablet Take 81 mg by mouth every morning.     atorvastatin (LIPITOR) 20 MG tablet Take 1 tablet (20 mg total) by mouth daily. 90 tablet 1   blood glucose meter kit and supplies Dispense based on patient and insurance preference. Use up to four times daily as directed. (E11.9). 1 each 0   Blood Glucose Monitoring Suppl (ONE TOUCH ULTRA 2) w/Device KIT USE TO CHECK BLOOD SUGAR   TWO TIMES A DAY 1 kit 0   Cholecalciferol (VITAMIN D) 2000 UNITS CAPS Take 2,000 Units by mouth daily.     clotrimazole-betamethasone (LOTRISONE) cream Apply 1 Application topically daily. 30 g 0   COMIRNATY syringe Inject 0.3 mLs into the muscle once.     famotidine (PEPCID) 40 MG tablet Take 1 tablet (40 mg total) by mouth at bedtime. 90 tablet 3   fexofenadine (ALLEGRA) 180 MG tablet Take 1 tablet (180 mg total) by mouth daily. Take 1 tablet by mouth once daily as needed for allergies (Patient taking differently: Take 180 mg by mouth daily as needed.) 90 tablet 3   fluticasone (FLONASE) 50 MCG/ACT nasal spray Place 2 sprays into both nostrils daily. 48 g 3   Lancets (ONETOUCH DELICA PLUS LANCET33G) MISC CHECK BLOOD SUGAR TWO TIMESA DAY. 200 each 1   ONETOUCH ULTRA test strip CHECK BLOOD SUGAR TWO TIMESA DAY 100 strip 2   pantoprazole (PROTONIX) 40 MG tablet TAKE 1 TABLET TWICE DAILY  BEFORE MEALS 180 tablet 3   polyethylene glycol powder (GAVILAX) 17 GM/SCOOP powder TAKE 17 G BY MOUTH 2 (TWO) TIMES DAILY AS NEEDED. 3350 g 1   telmisartan (MICARDIS) 20 MG tablet Take 1 tablet (20 mg total) by mouth  daily. 90 tablet 3   triamcinolone ointment (KENALOG) 0.5 % Apply 1 Application topically 2 (two) times daily. 30 g 0   No current facility-administered medications on file prior to visit.   No Known Allergies  Current Medications, Allergies, Past Medical History, Past Surgical History, Family History and Social History were reviewed in [employer] record.  Review of Systems:   Constitutional: Negative for fever, sweats, chills or weight loss.  Respiratory: Negative for shortness of breath.   Cardiovascular: Negative for chest pain, palpitations and leg swelling.  Gastrointestinal:  See HPI.  Musculoskeletal: Negative for back pain or muscle aches.  Neurological: Negative for dizziness, headaches or paresthesias.   Physical Exam: BP 120/64   Pulse 79   Ht 5\' 2"  (1.575 m)   Wt 146 lb (66.2 kg)   BMI 26.70 kg/m  General: 83 year old female in no acute distress. Head: Normocephalic and atraumatic. Eyes: No scleral icterus. Conjunctiva pink . Ears: Normal auditory acuity. Mouth: Dentition intact. No ulcers or lesions.  Lungs: Clear throughout to auscultation. Heart: Regular rate and rhythm. Systolic murmur. Abdomen: Soft, nontender and nondistended. No masses or hepatomegaly. Normal bowel sounds x 4 quadrants.  Rectal: Deferred.  Musculoskeletal: Symmetrical with no gross deformities. Extremities: No edema. Neurological: Alert oriented x 4. No focal deficits.  Psychological: Alert and cooperative. Normal mood and affect  Assessment and Recommendations:  83 year old female with a history of GERD, dysphagia. EGD 11/17/2021 showed a benign esophageal stricture at the GE junction which was dilated and a small hiatal hernia. She endorses having dysphagia which occurs only when eating beef, last episode occurred 10/02/2023. She has avoided eating beef since then and has not experienced any other episodes of dysphagia.  -EGD with dilatation discussed, patient defers  EGD for now but will reconsider if she has any further episodes of dysphagia.  -Patient instructed to avoid eating large pieces of meat, bread or rice. Cut food into small pieces and chew food thoroughly.  -Continue Pantoprazole 40mg  every day and Famotidine 40mg  Q HS  Constipation                                                                                                                                                                                                                                                        -Miralax Q HS as needed  History of colon polyps. Two tubular adenomatous polyps removed from the colon per colonoscopy 11/07/2016.   -No further colon polyp surveillance colonoscopies recommended due to age   Hypertrophic obstructive cardiomyopathy. ECHO 02/17/2020 showed normal LVEF 60 to 65%.  On Amlodipine, ASA, Lipitor.

## 2023-12-04 ENCOUNTER — Telehealth: Payer: Self-pay | Admitting: Nurse Practitioner

## 2023-12-04 ENCOUNTER — Ambulatory Visit: Payer: Self-pay | Admitting: Internal Medicine

## 2023-12-04 NOTE — Telephone Encounter (Signed)
 Inbound call from patient, states this morning she woke up with diarrhea and now feels as if she cannot eat anything and feels a burning sensation under her breast. She states she believes its Acid reflux. Would like a call from a nurse to further advise.

## 2023-12-04 NOTE — Telephone Encounter (Signed)
 Chief Complaint: N/V Symptoms: N/V, epigastric burning, diarrhea Frequency: after eating/drinking Pertinent Negatives: Patient denies Fever, abdominal pain, chest pain, anuria Disposition: [] ED /[x] Urgent Care (no appt availability in office) / [] Appointment(In office/virtual)/ []  [facility]/ [] Home Care/ [] Refused Recommended Disposition /[] [place] [facility]/ []  Follow-up with PCP Additional Notes: Patient called with complaints of nausea/vomiting, diarrhea. Patient states that she started vomiting today after taking milk of mag and then began experiencing diarrhea. Patient also states that she is unable to keep anything down and describes moderate vomiting as, "it has food in it". Patient states she has had a burning sensation underneath her breast in the center for the last hour after throwing up, and has had diarrhea at least two times today. Patient states she has had a history of GERD in the past but denies any spicy food and states she has only had saltine crackers today. Patient states she wants to be seen to be sure nothing else is occurring. Patient denies fever, anuria, abdominal pain, chest pain, or known contact with ill persons. Patient advised by this RN to been seen and has been scheduled UC appt due to no availability in the office. Patient advised by this RN to call back with worsening symptoms. Patient verbalized understanding.   Copied from CRM [id number]. Topic: Clinical - Red Word Triage >> Dec 04, 2023  3:09 PM Prudencio Pair wrote: Red Word that prompted transfer to Nurse Triage: Patient states she woke up this morning with diarrhea. She tried to eat something & drank some water. Patient states she vomited it back up. States it feels like it came from under her left breast. Wants to see if she can be seen by Dr. Okey Dupre. Reason for Disposition  [1] MILD or MODERATE vomiting AND [2] present > 48 hours (2 days) (Exception: Mild vomiting with associated  diarrhea.)  Answer Assessment - Initial Assessment Questions 1. VOMITING SEVERITY: "How many times have you vomited in the past 24 hours?"     - MILD:  1 - 2 times/day    - MODERATE: 3 - 5 times/day, decreased oral intake without significant weight loss or symptoms of dehydration    - SEVERE: 6 or more times/day, vomits everything or nearly everything, with significant weight loss, symptoms of dehydration      Moderate 2. ONSET: "When did the vomiting begin?"      Today 3. FLUIDS: "What fluids or food have you vomited up today?" "Have you been able to keep any fluids down?"     "I tried to drink water and some ginger ale which I probably shouldn't have done that. That all came up."   4. ABDOMEN PAIN: "Are your having any abdomen pain?" If Yes : "How bad is it and what does it feel like?" (e.g., crampy, dull, intermittent, constant)      Denies 5. DIARRHEA: "Is there any diarrhea?" If Yes, ask: "How many times today?"      This AM twice 6. CONTACTS: "Is there anyone else in the family with the same symptoms?"      Denies 7. CAUSE: "What do you think is causing your vomiting?"     Denies 8. HYDRATION STATUS: "Any signs of dehydration?" (e.g., dry mouth [not only dry lips], too weak to stand) "When did you last urinate?"     "I tried drinking Coffee this morning I did okay, but every time I drink something now it comes up. I tried drinking water about an hour ago and that  came up. 9. OTHER SYMPTOMS: "Do you have any other symptoms?" (e.g., fever, headache, vertigo, vomiting blood or coffee grounds, recent head injury)     Burning in the middle under breat.  Protocols used: Vomiting-A-AH

## 2023-12-04 NOTE — Telephone Encounter (Signed)
 Pt stated that she ate some pig feet two days ago and today she is having nausea/vomiting and diarrhea. Pt stated that her symptoms have only been today. Pt stated that the diarrhea is improving through out the day and she has two episodes of vomiting. Pt has an appointment tomorrow to see Urgent Care. Pt was notified that if her symptoms persist then to give [place] a call back but as of now with her only having the symptoms for 1 day recommendations will be to continue to monitor her symptoms, keep office visit with urgent care tomorrow and contact us if symptoms persist.  Pt verbalized understanding with all questions answered.

## 2023-12-05 ENCOUNTER — Ambulatory Visit: Payer: Self-pay

## 2023-12-09 ENCOUNTER — Encounter: Payer: Self-pay | Admitting: Internal Medicine

## 2023-12-09 ENCOUNTER — Telehealth: Payer: Self-pay

## 2023-12-09 NOTE — Telephone Encounter (Signed)
 Copied from CRM [id number]. Topic: Clinical - Medication Question >> Dec 09, 2023 10:07 AM Isabell A wrote: Reason for CRM: Patient states she spoke with [facility] in regard to her one touch ultra meter, she would like an update on when she would be receiving her meter. Patient is requesting a call back.

## 2023-12-09 NOTE — Telephone Encounter (Signed)
 Ok to do

## 2023-12-10 ENCOUNTER — Other Ambulatory Visit: Payer: Self-pay

## 2023-12-10 MED ORDER — ONETOUCH ULTRA VI STRP: ORAL_STRIP | 2 refills | Status: DC

## 2023-12-10 MED ORDER — ONETOUCH DELICA PLUS LANCET33G MISC: 1 refills | Status: DC

## 2023-12-10 MED ORDER — ONETOUCH ULTRA 2 W/DEVICE KIT: PACK | 0 refills | Status: AC

## 2023-12-20 LAB — HM DIABETES EYE EXAM

## 2023-12-23 ENCOUNTER — Encounter: Payer: Self-pay | Admitting: Internal Medicine

## 2024-01-06 NOTE — Telephone Encounter (Unsigned)
 Copied from CRM [id number]. Topic: Clinical - Lab/Test Results >> Jan 06, 2024  3:22 PM Jenny Madden wrote: Reason for CRM: Patient is calling because she received the message through Mychart stating that some lab work were not accurate. She would like to know if her results were effected in any way.

## 2024-01-07 NOTE — Telephone Encounter (Signed)
**Note De-identified  [place] Obfuscation** Please advise 

## 2024-01-14 ENCOUNTER — Ambulatory Visit: Payer: Self-pay

## 2024-01-14 NOTE — Telephone Encounter (Signed)
 Reason for Disposition . Mild eye allergy  Answer Assessment - Initial Assessment Questions 1. SEVERITY: "How bad is the itching?"  (e.g., Scale 1-10; mild, moderate or severe)     Moderate  2. ONSET: "When did the eye symptoms start?" (e.g., hours or days ago)     1 week ago  3. EYELIDS: "Are the eyelids swollen?" If Yes, ask: "How much?"     No  4. EYE DISCHARGE: "Is there any discharge from the eye, or eyelid crusting?" If Yes, ask: "How much?"     Yes they are runny  5. TRIGGER: "What do you think triggered the allergic reaction?" (e.g., dust, smoke, pollen, new eye make-up)     Pollen  6. RECURRENT PROBLEM: "Have you experienced eye allergies before?" If Yes, ask: "When was the last time?" and "What medicine worked best in the past?"     Yes, I was treated with Zaditor eye itch relief  7. CONTACTS: "Do you wear contacts?"     No  8. OTHER SYMPTOMS: "Do you have any other symptoms?" (e.g., runny nose)     Runny nose  Protocols used: Eye - Allergy-A-AH

## 2024-01-14 NOTE — Telephone Encounter (Signed)
 Chief Complaint: Eye itching  Symptoms: bilateral eye itching, clear runny eyes  Frequency: 1 week ago  Pertinent Negatives: Patient denies fever, cough, nausea, vomiting, swollen eyes Disposition: [] ED /[] Urgent Care (no appt availability in office) / [] Appointment(In office/virtual)/ []  [facility]/ [x] Home Care/ [] Refused Recommended Disposition /[] [facility]/ []  Follow-up with PCP Additional Notes: Patient states her eyes have been itchy for about 1 week. Patient states she think it is from the pollen outside. Patient is not currently taking any antihistamine or eye drops. Care advice was given and patient states she will try OTC treatment and update in 2 days if symptoms do not improve with Allegra and OTC eye drops.  Patient will need to be triaged due to symptomatic medication request.   Copied from CRM [id number]. Topic: Clinical - Medication Refill >> Jan 14, 2024  8:32 AM Clayton Bibles wrote: Most Recent Primary Care Visit:  Provider: Hillard Danker A  Department: [facility]  Visit Type: PHYSICAL  Date: 10/14/2023   Medication: Zaditor Eye Itch Relief Antihistamine Eye Drops - Her are itchy and drops would help. She has taken these eye drops in past   Has the patient contacted their pharmacy? Yes (Agent: If no, request that the patient contact the pharmacy for the refill. If patient does not wish to contact the pharmacy document the reason why and proceed with request.) (Agent: If yes, when and what did the pharmacy advise?) Pharmacy need order to refill   Is this the correct pharmacy for this prescription? Yes If no, delete pharmacy and type the correct one.  This is the patient's preferred pharmacy:    [facility] [phone] [place], [place] - [address] [place] [address] Phone: [phone] Fax: [phone]     Has the prescription been filled recently? No   Is the patient out of the medication? Yes   Has  the patient been seen for an appointment in the last year OR does the patient have an upcoming appointment? Yes   Can we respond through MyChart? Yes   Agent: Please be advised that Rx refills may take up to 3 business days. We ask that you follow-up with your pharmacy.

## 2024-01-14 NOTE — Telephone Encounter (Signed)
 1st attempt. Called and left VM for patient to return nurse triage call.   Patient will need to be triaged due to symptomatic medication request.  Copied from CRM [id number]. Topic: Clinical - Medication Refill >> Jan 14, 2024  8:32 AM Clayton Bibles wrote: Most Recent Primary Care Visit:  Provider: Hillard Danker A  Department: [facility]  Visit Type: PHYSICAL  Date: 10/14/2023  Medication: Zaditor Eye Itch Relief Antihistamine Eye Drops - Her are itchy and drops would help. She has taken these eye drops in past  Has the patient contacted their pharmacy? Yes (Agent: If no, request that the patient contact the pharmacy for the refill. If patient does not wish to contact the pharmacy document the reason why and proceed with request.) (Agent: If yes, when and what did the pharmacy advise?) Pharmacy need order to refill  Is this the correct pharmacy for this prescription? Yes If no, delete pharmacy and type the correct one.  This is the patient's preferred pharmacy:   [facility] [phone] [place], [place] - [address] [place] [address] Phone: [phone] Fax: [phone]   Has the prescription been filled recently? No  Is the patient out of the medication? Yes  Has the patient been seen for an appointment in the last year OR does the patient have an upcoming appointment? Yes  Can we respond through MyChart? Yes  Agent: Please be advised that Rx refills may take up to 3 business days. We ask that you follow-up with your pharmacy.

## 2024-03-25 ENCOUNTER — Other Ambulatory Visit: Payer: Self-pay | Admitting: Internal Medicine

## 2024-03-27 ENCOUNTER — Other Ambulatory Visit: Payer: Self-pay | Admitting: Internal Medicine

## 2024-04-01 ENCOUNTER — Telehealth: Payer: Self-pay

## 2024-04-01 NOTE — Telephone Encounter (Unsigned)
 Copied from CRM [id number]. Topic: General - Call Back - No Documentation >> Apr 01, 2024 12:34 PM Jenny Madden wrote: Reason for CRM: patient called and stated she had a missed call but no documentation. Please advise.

## 2024-04-07 ENCOUNTER — Ambulatory Visit (INDEPENDENT_AMBULATORY_CARE_PROVIDER_SITE_OTHER): Payer: Medicare (Managed Care) | Admitting: Internal Medicine

## 2024-04-07 ENCOUNTER — Encounter: Payer: Self-pay | Admitting: Internal Medicine

## 2024-04-07 VITALS — BP 130/64 | HR 65 | Temp 98.4°F | Ht 62.0 in | Wt 146.0 lb

## 2024-04-07 DIAGNOSIS — E782 Mixed hyperlipidemia: Secondary | ICD-10-CM | POA: Diagnosis not present

## 2024-04-07 DIAGNOSIS — R7303 Prediabetes: Secondary | ICD-10-CM

## 2024-04-07 DIAGNOSIS — M8589 Other specified disorders of bone density and structure, multiple sites: Secondary | ICD-10-CM

## 2024-04-07 LAB — POCT GLYCOSYLATED HEMOGLOBIN (HGB A1C): HbA1c POC (<> result, manual entry): 6 % (ref 4.0–5.6)

## 2024-04-07 NOTE — Progress Notes (Signed)
   Subjective:   Patient ID: Jenny Madden, female    DOB: [birth year], 83 y.o.   MRN: [id number]  HPI The patient is an 83 YO female coming in for medical management (see A/P for details).   Review of Systems  Constitutional: Negative.   HENT: Negative.    Eyes: Negative.   Respiratory:  Negative for cough, chest tightness and shortness of breath.   Cardiovascular:  Negative for chest pain, palpitations and leg swelling.  Gastrointestinal:  Negative for abdominal distention, abdominal pain, constipation, diarrhea, nausea and vomiting.  Musculoskeletal: Negative.   Skin: Negative.   Neurological: Negative.   Psychiatric/Behavioral: Negative.      Objective:  Physical Exam Constitutional:      Appearance: She is well-developed.  HENT:     Head: Normocephalic and atraumatic.   Cardiovascular:     Rate and Rhythm: Normal rate and regular rhythm.  Pulmonary:     Effort: Pulmonary effort is normal. No respiratory distress.     Breath sounds: Normal breath sounds. No wheezing or rales.  Abdominal:     General: Bowel sounds are normal. There is no distension.     Palpations: Abdomen is soft.     Tenderness: There is no abdominal tenderness. There is no rebound.   Musculoskeletal:     Cervical back: Normal range of motion.   Skin:    General: Skin is warm and dry.   Neurological:     Mental Status: She is alert and oriented to person, place, and time.     Coordination: Coordination normal.     Vitals:   04/07/24 0945  BP: 130/64  Pulse: 65  Temp: 98.4 F (36.9 C)  TempSrc: Oral  SpO2: 99%  Weight: 146 lb (66.2 kg)  Height: 5' 2 (1.575 m)    Assessment & Plan:

## 2024-04-07 NOTE — Assessment & Plan Note (Signed)
 Discussed that she could repeat DEXA if desired. She chooses to wait for now.

## 2024-04-07 NOTE — Assessment & Plan Note (Signed)
 POC HgA1c done and stable at 6.0. She is is pre-diabetes range for some time off meds will adjust diagnosis.

## 2024-04-07 NOTE — Patient Instructions (Signed)
 We will keep everything the same.

## 2024-04-07 NOTE — Assessment & Plan Note (Signed)
 Taking lipitor and continue. Adjusted diagnosis due to consistent sugar readings in pre-diabetes range off meds for some time.

## 2024-04-17 ENCOUNTER — Ambulatory Visit (HOSPITAL_COMMUNITY)
Admission: RE | Admit: 2024-04-17 | Discharge: 2024-04-17 | Disposition: A | Discharge status: Home / Self Care | Payer: Medicare (Managed Care) | Source: Ambulatory Visit | Attending: Internal Medicine | Admitting: Internal Medicine

## 2024-04-17 DIAGNOSIS — I5032 Chronic diastolic (congestive) heart failure: Secondary | ICD-10-CM | POA: Diagnosis not present

## 2024-04-17 DIAGNOSIS — I421 Obstructive hypertrophic cardiomyopathy: Secondary | ICD-10-CM | POA: Insufficient documentation

## 2024-04-17 LAB — ECHOCARDIOGRAM COMPLETE
Area-P 1/2: 2.6 cm2
S' Lateral: 2.3 cm

## 2024-04-19 ENCOUNTER — Ambulatory Visit: Payer: Self-pay | Admitting: Cardiovascular Disease

## 2024-04-20 ENCOUNTER — Telehealth: Payer: Self-pay | Admitting: Cardiovascular Disease

## 2024-04-20 NOTE — Telephone Encounter (Signed)
 Please review result documentation.

## 2024-04-20 NOTE — Telephone Encounter (Signed)
 Pt calling regarding Echo results. She does not understand and requesting cb. Ok to leave detailed result message

## 2024-05-07 ENCOUNTER — Other Ambulatory Visit: Payer: Self-pay | Admitting: Internal Medicine

## 2024-06-16 ENCOUNTER — Ambulatory Visit (INDEPENDENT_AMBULATORY_CARE_PROVIDER_SITE_OTHER): Payer: Medicare (Managed Care) | Admitting: Internal Medicine

## 2024-06-16 ENCOUNTER — Encounter: Payer: Self-pay | Admitting: Internal Medicine

## 2024-06-16 VITALS — BP 120/80 | HR 70 | Temp 98.5°F | Ht 62.0 in | Wt 146.0 lb

## 2024-06-16 DIAGNOSIS — R7303 Prediabetes: Secondary | ICD-10-CM | POA: Diagnosis not present

## 2024-06-16 DIAGNOSIS — I5032 Chronic diastolic (congestive) heart failure: Secondary | ICD-10-CM | POA: Diagnosis not present

## 2024-06-16 DIAGNOSIS — K2100 Gastro-esophageal reflux disease with esophagitis, without bleeding: Secondary | ICD-10-CM

## 2024-06-16 DIAGNOSIS — J3089 Other allergic rhinitis: Secondary | ICD-10-CM | POA: Diagnosis not present

## 2024-06-16 DIAGNOSIS — Z23 Encounter for immunization: Secondary | ICD-10-CM | POA: Diagnosis not present

## 2024-06-16 DIAGNOSIS — K5909 Other constipation: Secondary | ICD-10-CM

## 2024-06-16 NOTE — Patient Instructions (Addendum)
We will not check labs today.

## 2024-06-16 NOTE — Progress Notes (Unsigned)
 Subjective:   Patient ID: Jenny Madden, female    DOB: 11-Jul-1941, 83 y.o.   MRN: [id number]  Discussed the use of AI scribe software for clinical note transcription with the patient, who gave verbal consent to proceed.  History of Present Illness Jenny Madden is an 83 year old female with hypertrophic obstructive cardiomyopathy who presents for follow-up.  She has a history of hypertrophic obstructive cardiomyopathy. She reports that Dr. Donnice Exon told her her echocardiogram was like it was last year. No new chest pain, tightness, or pressure. She reports that when she has chest discomfort, she thinks it is her reflux. No new breathing problems, cough, or significant allergy symptoms, though she experiences mild fall allergies.  She experiences occasional discomfort in her lower back, described as a cramp or gas-like sensation, which resolves when she lies down at night or starts moving. This does not occur daily.  She reports chronic constipation and has been using prunes to manage it, as Miralax  was not effective. She ensures adequate fluid intake.  She occasionally experiences epistaxis, which she attributes to possible irritation or other benign causes.  Recent blood tests showed stable blood sugar levels in the prediabetes range, normal thyroid  levels, blood counts, kidney and liver function, and good cholesterol levels. Her appetite remains good, although she sometimes struggles with deciding what to eat.  Review of Systems  Constitutional: Negative.   HENT: Negative.    Eyes: Negative.   Respiratory:  Positive for chest tightness. Negative for cough and shortness of breath.   Cardiovascular:  Negative for chest pain, palpitations and leg swelling.  Gastrointestinal:  Negative for abdominal distention, abdominal pain, constipation, diarrhea, nausea and vomiting.  Musculoskeletal: Negative.   Skin: Negative.   Neurological: Negative.   Psychiatric/Behavioral:  Negative.      Objective:  Physical Exam Constitutional:      Appearance: She is well-developed.  HENT:     Head: Normocephalic and atraumatic.  Cardiovascular:     Rate and Rhythm: Normal rate and regular rhythm.  Pulmonary:     Effort: Pulmonary effort is normal. No respiratory distress.     Breath sounds: Normal breath sounds. No wheezing or rales.  Abdominal:     General: Bowel sounds are normal. There is no distension.     Palpations: Abdomen is soft.     Tenderness: There is no abdominal tenderness.  Musculoskeletal:     Cervical back: Normal range of motion.  Skin:    General: Skin is warm and dry.  Neurological:     Mental Status: She is alert and oriented to person, place, and time.     Coordination: Coordination normal.     Vitals:   06/16/24 1023  BP: 120/80  Pulse: 70  Temp: 98.5 F (36.9 C)  TempSrc: Oral  SpO2: 99%  Weight: 146 lb (66.2 kg)  Height: 5' 2 (1.575 m)   Flu shot given at visit  Assessment and Plan Assessment & Plan Hypertrophic obstructive cardiomyopathy with diastolic dysfunction   Echocardiogram shows left ventricular thickening consistent with hypertrophic obstructive cardiomyopathy, causing diastolic dysfunction. The condition is stable without progression. Maintain the current management plan and regularly monitor blood pressure.  Prediabetes   Blood sugar levels are in the prediabetes range but have improved since January. Regularly monitor blood sugar and encourage lifestyle modifications to prevent progression.  Constipation   Chronic constipation is managed with prunes and adequate fluid intake, as Miralax  is ineffective. Continue using prunes and ensure adequate  hydration.  Gastroesophageal reflux disease (GERD)   Intermittent chest pain is likely related to GERD, especially after consuming trigger foods. There are no new symptoms. Avoid known trigger foods and monitor for new or worsening symptoms.  Allergic rhinitis,  seasonal   Mild fall allergies are noted, with more pronounced symptoms in spring and summer. Monitor symptoms and manage as needed, especially in spring and summer.

## 2024-06-18 ENCOUNTER — Encounter: Payer: Self-pay | Admitting: Internal Medicine

## 2024-06-18 NOTE — Assessment & Plan Note (Signed)
 She is on maximal treatment. Intermittent chest pain is likely related to GERD, especially after consuming trigger foods. There are no new symptoms. Avoid known trigger foods and monitor for new or worsening symptoms.

## 2024-06-18 NOTE — Assessment & Plan Note (Signed)
 Chronic constipation is managed with prunes and adequate fluid intake, as Miralax  is ineffective. Continue using prunes and ensure adequate hydration.

## 2024-06-18 NOTE — Assessment & Plan Note (Signed)
 Mild fall allergies are noted, with more pronounced symptoms in spring and summer. Monitor symptoms and manage as needed, especially in spring and summer.

## 2024-06-18 NOTE — Assessment & Plan Note (Signed)
 Blood sugar levels are in the prediabetes range but have improved since January. Regularly monitor blood sugar and encourage lifestyle modifications to prevent progression.

## 2024-06-18 NOTE — Assessment & Plan Note (Signed)
 Echocardiogram shows left ventricular thickening consistent with hypertrophic obstructive cardiomyopathy, causing diastolic dysfunction. The condition is stable without progression. Maintain the current management plan and regularly monitor blood pressure.

## 2024-06-22 ENCOUNTER — Other Ambulatory Visit: Payer: Self-pay

## 2024-06-22 ENCOUNTER — Other Ambulatory Visit: Payer: Self-pay | Admitting: Internal Medicine

## 2024-06-22 MED ORDER — PANTOPRAZOLE SODIUM 40 MG PO TBEC: DELAYED_RELEASE_TABLET | ORAL | 3 refills | Status: AC

## 2024-06-22 NOTE — Telephone Encounter (Signed)
 Copied from CRM #[id number]. Topic: Clinical - Medication Refill >> Jun 22, 2024  7:55 AM Tanazia G wrote: Medication: pantoprazole  (PROTONIX ) 40 MG tablet  Has the patient contacted their pharmacy? Yes (Agent: If no, request that the patient contact the pharmacy for the refill. If patient does not wish to contact the pharmacy document the reason why and proceed with request.) (Agent: If yes, when and what did the pharmacy advise?)  This is the patient's preferred pharmacy:  [facility] - [place], [place] - [address] [place] [address] Phone: [phone] Fax: [phone]  Is this the correct pharmacy for this prescription? Yes If no, delete pharmacy and type the correct one.   Has the prescription been filled recently? Yes  Is the patient out of the medication? Yes  Has the patient been seen for an appointment in the last year OR does the patient have an upcoming appointment? Yes  Can we respond through MyChart? Yes  Agent: Please be advised that Rx refills may take up to 3 business days. We ask that you follow-up with your pharmacy.

## 2024-06-22 NOTE — Telephone Encounter (Signed)
 This has been sent In to correct pharmacy

## 2024-06-22 NOTE — Telephone Encounter (Signed)
 Pt request send to another pharmacy: [facility] - [place], [place] - [address] [place] [address] Phone: [phone] Fax: [phone]

## 2024-07-31 NOTE — Progress Notes (Signed)
 Cardiology Office Note   Date:  08/04/2024  ID:  Quanisha, Drewry [birth year], MRN [id number] PCP: Rollene Almarie LABOR, MD  [facility] Providers Cardiologist:  Lonni Cash, MD Cardiology APP:  Carlin Delon BROCKS, NP     History of Present Illness Jenny Madden is a 83 y.o. female with past medical history of CAD, HCM, hypertension, venous insufficiency, GERD, dyslipidemia, renal cancer, CKD, DM2.  04/17/2024 echo EF 70 75%, severe symmetric LVH of the septal segment, grade 1 DD, mildly elevated PASP, LA severely dilated, chordal systolic anterior motion with moderate mitral annular calcification with mild regurgitation 04/16/2023 echo 70 to 75%, grade 1 DD, elevated LA pressure, proximal septal thickening with chordal systolic anterior motion, LA severely dilated 02/17/2020 echo EF 60 to 65%, severe left ventricular hypertrophy of the basal septal segment  She established care with Dr. Cash in 2021 at the behest of her PCP for evaluation of her murmur, an echocardiogram was arranged revealing a preserved EF, and severe LVH consistent with hypertrophic cardiomyopathy.  Most recently evaluated by Jackee Alberts, NP on 03/12/2023, repeat echocardiogram was arranged which revealed no significant changes, no changes made to medications or plan of care and she is advised she can follow-up in 1 year.  She presents today for follow-up, she does not have any formal complaints since she was last evaluated in our office.  She lives at home by herself, independent.  Checks her blood pressure at home and is typically well-controlled.  We discussed her HOCM, she is overall asymptomatic but we did discuss starting low-dose beta-blocker and she is amenable. She denies chest pain, palpitations, dyspnea, pnd, orthopnea, n, v, dizziness, syncope, edema, weight gain, or early satiety.    ROS: Review of Systems  All other systems reviewed and are negative.    Studies Reviewed EKG  Interpretation Date/Time:  Tuesday August 04 2024 11:50:55 EDT Ventricular Rate:  77 PR Interval:  146 QRS Duration:  82 QT Interval:  384 QTC Calculation: 434 R Axis:   57  Text Interpretation: Normal sinus rhythm Normal ECG When compared with ECG of 27-Mar-2022 13:02, T wave inversion no longer evident in Inferior leads T wave inversion no longer evident in Anterolateral leads Confirmed by Carlin Delon [phone]) on 08/04/2024 11:55:09 AM    Cardiac Studies & Procedures   ______________________________________________________________________________________________     ECHOCARDIOGRAM  ECHOCARDIOGRAM COMPLETE 04/17/2024  Narrative ECHOCARDIOGRAM REPORT    Patient Name:   Jenny Madden Date of Exam: 04/17/2024 Medical Rec #:  [id number]         Height:       62.0 in Accession #:    [id number]        Weight:       146.0 lb Date of Birth:  Feb 25, 1941         BSA:          1.672 m Patient Age:    82 years          BP:           130/64 mmHg Patient Gender: F                 HR:           79 bpm. Exam Location:  [address]  Procedure: 2D Echo, Cardiac Doppler and Color Doppler (Both Spectral and Color Flow Doppler were utilized during procedure).  Indications:    HOCM I42.1  History:        Patient has  prior history of Echocardiogram examinations, most recent 04/16/2023. HOCM and CHF; Risk Factors:Hypertension, Diabetes and Borderline dilated ascending aorta.  Sonographer:    Elsie Bohr RDCS Referring Phys: [id number] PAULA V ROSS  IMPRESSIONS   1. LVOT gradient with peak velocity 3.14 m/s and peak gradieng 39 mmHg at rest. Will Valsavla this increases to 4.8 m/s and 92 mmHg, respectively. With recovery peak velocity 2.9 m/s, peak gradient 34 mmHg. Left ventricular ejection fraction, by estimation, is 70 to 75%. The left ventricle has hyperdynamic function. The left ventricle has no regional wall motion abnormalities. There is severe asymmetric left ventricular  hypertrophy of the septal segment. Left ventricular diastolic parameters are consistent with Grade I diastolic dysfunction (impaired relaxation). Elevated left ventricular end-diastolic pressure. 2. Right ventricular systolic function is normal. The right ventricular size is normal. There is mildly elevated pulmonary artery systolic pressure. 3. Left atrial size was severely dilated. 4. Chordal SAM. The mitral valve is normal in structure. Mild mitral valve regurgitation. Mild mitral stenosis. Moderate mitral annular calcification. 5. The aortic valve is tricuspid. Aortic valve regurgitation is not visualized. No aortic stenosis is present. 6. The inferior vena cava is normal in size with greater than 50% respiratory variability, suggesting right atrial pressure of 3 mmHg.  Conclusion(s)/Recommendation(s): Findings consistent with hypertrophic obstructive cardiomyopathy.  FINDINGS Left Ventricle: LVOT gradient with peak velocity 3.14 m/s and peak gradieng 39 mmHg at rest. Will Valsavla this increases to 4.8 m/s and 92 mmHg, respectively. With recovery peak velocity 2.9 m/s, peak gradient 34 mmHg. Left ventricular ejection fraction, by estimation, is 70 to 75%. The left ventricle has hyperdynamic function. The left ventricle has no regional wall motion abnormalities. The left ventricular internal cavity size was normal in size. There is severe asymmetric left ventricular hypertrophy of the septal segment. Left ventricular diastolic parameters are consistent with Grade I diastolic dysfunction (impaired relaxation). Elevated left ventricular end-diastolic pressure.  Right Ventricle: The right ventricular size is normal. No increase in right ventricular wall thickness. Right ventricular systolic function is normal. There is mildly elevated pulmonary artery systolic pressure. The tricuspid regurgitant velocity is 2.92 m/s, and with an assumed right atrial pressure of 3 mmHg, the estimated right  ventricular systolic pressure is 37.1 mmHg.  Left Atrium: Left atrial size was severely dilated.  Right Atrium: Right atrial size was normal in size.  Pericardium: There is no evidence of pericardial effusion.  Mitral Valve: Chordal SAM. The mitral valve is normal in structure. Moderate mitral annular calcification. Mild mitral valve regurgitation. Mild mitral valve stenosis. MV peak gradient, 14.6 mmHg. The mean mitral valve gradient is 6.0 mmHg.  Tricuspid Valve: The tricuspid valve is normal in structure. Tricuspid valve regurgitation is trivial. No evidence of tricuspid stenosis.  Aortic Valve: The aortic valve is tricuspid. Aortic valve regurgitation is not visualized. No aortic stenosis is present.  Pulmonic Valve: The pulmonic valve was normal in structure. Pulmonic valve regurgitation is not visualized. No evidence of pulmonic stenosis.  Aorta: The aortic root is normal in size and structure.  Venous: The inferior vena cava is normal in size with greater than 50% respiratory variability, suggesting right atrial pressure of 3 mmHg.  IAS/Shunts: No atrial level shunt detected by color flow Doppler.   LEFT VENTRICLE PLAX 2D LVIDd:         3.70 cm   Diastology LVIDs:         2.30 cm   LV e' medial:    5.66 cm/s LV PW:  1.20 cm   LV E/e' medial:  26.3 LV IVS:        1.70 cm   LV e' lateral:   4.46 cm/s LVOT diam:     2.00 cm   LV E/e' lateral: 33.4 LVOT Area:     3.14 cm   RIGHT VENTRICLE             IVC RV S prime:     28.80 cm/s  IVC diam: 1.00 cm TAPSE (M-mode): 2.6 cm RVSP:           37.1 mmHg  LEFT ATRIUM              Index        RIGHT ATRIUM           Index LA diam:        4.40 cm  2.63 cm/m   RA Pressure: 3.00 mmHg LA Vol (A2C):   107.0 ml 63.98 ml/m  RA Area:     13.50 cm LA Vol (A4C):   97.7 ml  58.42 ml/m  RA Volume:   34.40 ml  20.57 ml/m LA Biplane Vol: 103.0 ml 61.59 ml/m  AORTA Ao Root diam: 3.20 cm Ao Asc diam:  3.70 cm  MITRAL VALVE                 TRICUSPID VALVE MV Area (PHT): 2.60 cm     TR Peak grad:   34.1 mmHg MV Peak grad:  14.6 mmHg    TR Vmax:        292.00 cm/s MV Mean grad:  6.0 mmHg     Estimated RAP:  3.00 mmHg MV Vmax:       1.91 m/s     RVSP:           37.1 mmHg MV Vmean:      122.0 cm/s MV Decel Time: 292 msec     SHUNTS MV E velocity: 149.00 cm/s  Systemic Diam: 2.00 cm MV A velocity: 188.00 cm/s MV E/A ratio:  0.79  Annabella Scarce MD Electronically signed by Annabella Scarce MD Signature Date/Time: 04/17/2024/4:02:22 PM    Final          ______________________________________________________________________________________________      Risk Assessment/Calculations           Physical Exam VS:  BP 138/80 (BP Location: Left Arm, Patient Position: Sitting, Cuff Size: Normal)   Pulse 77   Resp 16   Ht 5' 2 (1.575 m)   Wt 147 lb 3.2 oz (66.8 kg)   SpO2 96%   BMI 26.92 kg/m        Wt Readings from Last 3 Encounters:  08/04/24 147 lb 3.2 oz (66.8 kg)  06/16/24 146 lb (66.2 kg)  04/07/24 146 lb (66.2 kg)    GEN: Well nourished, well developed in no acute distress NECK: No JVD; No carotid bruits CARDIAC: RRR, 3/6 systolic murmur, nonradiating  RESPIRATORY:  Clear to auscultation without rales, wheezing or rhonchi  ABDOMEN: Soft, non-tender, non-distended EXTREMITIES:  No edema; No deformity   ASSESSMENT AND PLAN HOCM -she is overall asymptomatic denies chest pain, shortness of breath, syncope, palpitations, will start very low-dose beta-blocker, metoprolol  succinate 12.5 mg to take in the evening.    Hypertension-blood pressure is 138/80, she reports is better controlled, continue Norvasc  5 mg daily, telmisartan  20 mg daily, adding low-dose metoprolol  12.5 mg once in the evening.  Dyslipidemia-most recent LDL is 97, this appears to be monitored by her  PCP, currently on Lipitor 20 mg daily.    DM2-A1c in 2017 was 6.7%, most recent A1c is well-controlled at 6.0%, this appears to  be through lifestyle modifications.  Dispo: Start metoprolol  succinate 12.5 mg in the evening, follow-up in 1 year.  Signed, Delon JAYSON Hoover, NP

## 2024-08-04 ENCOUNTER — Encounter: Payer: Self-pay | Admitting: Cardiology

## 2024-08-04 ENCOUNTER — Ambulatory Visit: Payer: Medicare (Managed Care) | Attending: Cardiology | Admitting: Cardiology

## 2024-08-04 VITALS — BP 138/80 | HR 77 | Resp 16 | Ht 62.0 in | Wt 147.2 lb

## 2024-08-04 DIAGNOSIS — I421 Obstructive hypertrophic cardiomyopathy: Secondary | ICD-10-CM

## 2024-08-04 DIAGNOSIS — I5032 Chronic diastolic (congestive) heart failure: Secondary | ICD-10-CM | POA: Diagnosis not present

## 2024-08-04 DIAGNOSIS — E1165 Type 2 diabetes mellitus with hyperglycemia: Secondary | ICD-10-CM

## 2024-08-04 DIAGNOSIS — I10 Essential (primary) hypertension: Secondary | ICD-10-CM

## 2024-08-04 DIAGNOSIS — E782 Mixed hyperlipidemia: Secondary | ICD-10-CM | POA: Diagnosis not present

## 2024-08-04 MED ORDER — METOPROLOL SUCCINATE ER 25 MG PO TB24: 12.5000 mg | ORAL_TABLET | Freq: Every day | ORAL | 3 refills | Status: DC

## 2024-08-04 NOTE — Patient Instructions (Addendum)
 Medication Instructions:   START  TAKING  :  TOPROL   XL   12.5 MG ONCE A  DAY  ( HALF A TABLET  OF 25 MG  )   *If you need a refill on your cardiac medications before your next appointment, please call your pharmacy*   Lab Work: NONE ORDERED  TODAY     If you have labs (blood work) drawn today and your tests are completely normal, you will receive your results only by: MyChart Message (if you have MyChart) OR A paper copy in the mail If you have any lab test that is abnormal or we need to change your treatment, we will call you to review the results.    Testing/Procedures: NONE ORDERED  TODAY    Follow-Up: At [facility], you and your health needs are our priority.  As part of our continuing mission to provide you with exceptional heart care, our providers are all part of one team.  This team includes your primary Cardiologist (physician) and Advanced Practice Providers or APPs (Physician Assistants and Nurse Practitioners) who all work together to provide you with the care you need, when you need it.   Your next appointment:   1 year(s)  Provider:   Lonni Cash, MD / Carlin NP     We recommend signing up for the patient portal called MyChart.  Sign up information is provided on this After Visit Summary.  MyChart is used to connect with patients for Virtual Visits (Telemedicine).  Patients are able to view lab/test results, encounter notes, upcoming appointments, etc.  Non-urgent messages can be sent to your provider as well.   To learn more about what you can do with MyChart, go to [email or web address].   Other Instructions

## 2024-08-11 ENCOUNTER — Telehealth: Payer: Self-pay | Admitting: Cardiovascular Disease

## 2024-08-11 NOTE — Telephone Encounter (Signed)
 Spoke with patient and shared response from Delon Hoover, NP OK to stop Toprol  XL.  Patient verbalized understanding and expressed appreciation for follow-up.  Medication list updated and metoprolol  succinate added to allergy list as intolerance.

## 2024-08-11 NOTE — Telephone Encounter (Signed)
 Pt c/o medication issue:  1. Name of Medication:   metoprolol  succinate (TOPROL  XL) 25 MG 24 hr tablet    2. How are you currently taking this medication (dosage and times per day)?  Take 0.5 tablets (12.5 mg total) by mouth daily.      3. Are you having a reaction (difficulty breathing--STAT)? No  4. What is your medication issue? Pt is requesting a callback back regarding her feeling as if this medication is causing her to be drowsy, fatigue and have some dizziness at times. Please advise

## 2024-08-11 NOTE — Telephone Encounter (Signed)
 Patient reports since starting Toprol  XL 12.5 mg daily a week ago she has felt dizzy, drowsy, fatigued and not like herself.  She only had one BP/HR reading from yesterday, 08/10/24: 138/70, HR 66.  Patient states if this medication is going to make her feel this way she does not want to continue it.  Will forward to Delon Hoover, NP to review and advise.

## 2024-09-09 NOTE — Progress Notes (Signed)
 83 y.o. [id number] postmenopausal female here for annual exam. Widowed. PCP: Rollene Almarie LABOR, MD   She reports ***. Urine sample provided: ***  Postmenopausal bleeding: *** Pelvic discharge or pain: *** Breast mass, nipple discharge or skin changes : *** Sexually active: ***   Last PAP: No results found for: DIAGPAP, HPVHIGH, ADEQPAP Last mammogram: 10/18/22 Birads 1, density B Last DXA: 10/03/17 *** Last colonoscopy:  10/30/2011  Exercising: *** Smoker:***  Flowsheet Row Office Visit from 04/07/2024 in [facility] [place]  PHQ-2 Total Score 0    Flowsheet Row Office Visit from 03/29/2023 in [facility] [place]  PHQ-9 Total Score 0     GYN HISTORY: ***  OB History  Gravida Para Term Preterm AB Living  3 1 1  2 1   SAB IAB Ectopic Multiple Live Births      1    # Outcome Date GA Lbr Len/2nd Weight Sex Type Anes PTL Lv  3 AB           2 AB           1 Term     F CS-Unspec  N LIV   Past Medical History:  Diagnosis Date   Allergic rhinitis, cause unspecified    Anxiety state, unspecified    none recent   Benign neoplasm of colon    Cancer (HCC)    renal mass   Cataract    Chronic diastolic heart failure (HCC) 03/26/2022   Chronic kidney disease 01/2016   Tumor left side   Constipation 10/19/2015   Diverticulosis of colon (without mention of hemorrhage)    Dysphagia    Family history of adverse reaction to anesthesia 3 yrs ago   slow to awaken    Family history of malignant neoplasm of gastrointestinal tract    Heart murmur    Hypertension    Nonspecific (abnormal) findings on radiological and other examination of other intrathoracic organs    Nonspecific abnormal electrocardiogram (ECG) (EKG)    Osteopenia 12/27/2011   Osteoporosis    Other dysphagia    Pure hypercholesterolemia    Type II or unspecified type diabetes mellitus without mention of complication, not stated as uncontrolled     Unspecified essential hypertension    Unspecified tinnitus years ago   Vitamin D  deficiency 12/27/2011   Past Surgical History:  Procedure Laterality Date   CATARACT EXTRACTION     CESAREAN SECTION     x 1   COLONOSCOPY     colonscopy and endoscopy  5 yrs ago   FOOT SURGERY     bilateral for hammer toes   IR GENERIC HISTORICAL  12/06/2015   IR RADIOLOGIST EVAL & MGMT 12/06/2015 Marcey Moan, MD [facility]   IR GENERIC HISTORICAL  05/03/2016   IR RADIOLOGIST EVAL & MGMT 05/03/2016 Marcey Moan, MD [facility]   IR GENERIC HISTORICAL  02/09/2016   IR RADIOLOGIST EVAL & MGMT 02/09/2016 [facility]   IR RADIOLOGIST EVAL & MGMT  01/09/2017   IR RADIOLOGIST EVAL & MGMT  01/14/2018   IR RADIOLOGIST EVAL & MGMT  03/31/2019   IR RADIOLOGIST EVAL & MGMT  03/30/2020   LAPAROTOMY N/A 03/27/2022   Procedure: EXPLORATORY LAPAROTOMY, POSSIBLE SMALL BOWEL RESECTION;  Surgeon: Ebbie Cough, MD;  Location: [facility];  Service: General;  Laterality: N/A;   RADIOFREQUENCY ABLATION KIDNEY Left 01/2016   Renal mass   UPPER GASTROINTESTINAL ENDOSCOPY  Current Outpatient Medications on File Prior to Visit  Medication Sig Dispense Refill   acetaminophen  (TYLENOL ) 325 MG tablet Take 2 tablets (650 mg total) by mouth every 6 (six) hours as needed for moderate pain. 30 tablet 0   amLODipine  (NORVASC ) 5 MG tablet Take 1 tablet (5 mg total) by mouth daily. 90 tablet 3   aspirin 81 MG tablet Take 81 mg by mouth every morning.     atorvastatin  (LIPITOR) 20 MG tablet TAKE 1 TABLET DAILY 90 tablet 3   blood glucose meter kit and supplies Dispense based on patient and insurance preference. Use up to four times daily as directed. (E11.9). 1 each 0   Blood Glucose Monitoring Suppl (ONE TOUCH ULTRA 2) w/Device KIT USE TO CHECK BLOOD SUGAR   TWO TIMES A DAY 1 kit 0   Cholecalciferol (VITAMIN D ) 2000 UNITS CAPS Take 2,000 Units by mouth daily.     clotrimazole -betamethasone  (LOTRISONE ) cream Apply 1  Application topically daily. 30 g 0   famotidine  (PEPCID ) 40 MG tablet Take 1 tablet (40 mg total) by mouth at bedtime. 90 tablet 3   fexofenadine  (ALLEGRA ) 180 MG tablet Take 1 tablet (180 mg total) by mouth daily. Take 1 tablet by mouth once daily as needed for allergies 90 tablet 3   fluticasone  (FLONASE ) 50 MCG/ACT nasal spray Place 2 sprays into both nostrils daily. 48 g 3   glucose blood (ONETOUCH ULTRA) test strip USE TO CHECK BLOOD SUGAR TWICE A DAY (ANNUAL APPOINTMENT DUE IN [place], MUST SEE PROVIDER FOR FURTHER REFILLS) 100 strip 6   Lancets (ONETOUCH DELICA PLUS LANCET33G) MISC USE TO CHECK BLOOD SUGAR TWICE A DAY 200 each 3   pantoprazole  (PROTONIX ) 40 MG tablet TAKE 1 TABLET TWICE DAILY  BEFORE MEALS 180 tablet 3   polyethylene glycol powder (GAVILAX) 17 GM/SCOOP powder TAKE 17 G BY MOUTH 2 (TWO) TIMES DAILY AS NEEDED. 3350 g 1   telmisartan  (MICARDIS ) 20 MG tablet Take 1 tablet (20 mg total) by mouth daily. 90 tablet 3   triamcinolone  ointment (KENALOG ) 0.5 % Apply 1 Application topically 2 (two) times daily. 30 g 0   No current facility-administered medications on file prior to visit.   Social History   Socioeconomic History   Marital status: Widowed    Spouse name: Viona x 46 yrs   Number of children: 1   Years of education: 14   Highest education level: Some college, no degree  Occupational History   Occupation: Retired  Tobacco Use   Smoking status: Former    Current packs/day: 0.00    Average packs/day: 0.5 packs/day for 30.0 years (15.0 ttl pk-yrs)    Types: Cigarettes    Start date: 10/08/1969    Quit date: 10/09/1999    Years since quitting: 24.9   Smokeless tobacco: Never  Vaping Use   Vaping status: Never Used  Substance and Sexual Activity   Alcohol use: Yes    Alcohol/week: 1.0 standard drink of alcohol    Types: 1 Standard drinks or equivalent per week    Comment: social use   Drug use: No   Sexual activity: Not Currently    Partners: Male    Birth  control/protection: Post-menopausal    Comment: 1st intercourse- 21, partners- 3  Other Topics Concern   Not on file  Social History Narrative   Widowed, Alonzo x 46 yrs   1 child, daughter, 2 grandddaughter   High school education   Live alone; 1 story home  Social Drivers of Corporate Investment Banker Strain: Low Risk  (04/03/2024)   Overall Financial Resource Strain (CARDIA)    Difficulty of Paying Living Expenses: Not hard at all  Food Insecurity: No Food Insecurity (04/03/2024)   Hunger Vital Sign    Worried About Running Out of Food in the Last Year: Never true    Ran Out of Food in the Last Year: Never true  Transportation Needs: No Transportation Needs (04/03/2024)   PRAPARE - Administrator, Civil Service (Medical): No    Lack of Transportation (Non-Medical): No  Physical Activity: Insufficiently Active (04/03/2024)   Exercise Vital Sign    Days of Exercise per Week: 3 days    Minutes of Exercise per Session: 20 min  Stress: No Stress Concern Present (04/03/2024)   [employer] of Occupational Health - Occupational Stress Questionnaire    Feeling of Stress: Not at all  Social Connections: Moderately Integrated (04/03/2024)   Social Connection and Isolation Panel    Frequency of Communication with Friends and Family: Three times a week    Frequency of Social Gatherings with Friends and Family: Twice a week    Attends Religious Services: More than 4 times per year    Active Member of [employer] or Organizations: Yes    Attends Banker Meetings: More than 4 times per year    Marital Status: Widowed  Intimate Partner Violence: Not At Risk (07/27/2022)   Humiliation, Afraid, Rape, and Kick questionnaire    Fear of Current or Ex-Partner: No    Emotionally Abused: No    Physically Abused: No    Sexually Abused: No   Family History  Problem Relation Age of Onset   Colon cancer Brother 39   Esophageal cancer Neg Hx    Rectal cancer Neg Hx     Stomach cancer Neg Hx    Allergies  Allergen Reactions   Metoprolol  Succinate [Metoprolol ]     Dizziness, drowsy, fatigued.      PE There were no vitals filed for this visit. There is no height or weight on file to calculate BMI.  Physical Exam    Assessment and Plan:        There are no diagnoses linked to this encounter. Clotilda FORBES Pa, CMA

## 2024-09-10 ENCOUNTER — Other Ambulatory Visit (HOSPITAL_COMMUNITY)
Admission: RE | Admit: 2024-09-10 | Discharge: 2024-09-10 | Disposition: A | Discharge status: Home / Self Care | Payer: Medicare (Managed Care) | Source: Ambulatory Visit | Attending: Obstetrics and Gynecology | Admitting: Obstetrics and Gynecology

## 2024-09-10 ENCOUNTER — Encounter: Payer: Self-pay | Admitting: Obstetrics and Gynecology

## 2024-09-10 ENCOUNTER — Ambulatory Visit: Payer: Medicare (Managed Care) | Admitting: Obstetrics and Gynecology

## 2024-09-10 VITALS — BP 124/62 | HR 84 | Temp 98.6°F | Ht 62.75 in | Wt 145.0 lb

## 2024-09-10 DIAGNOSIS — Z01419 Encounter for gynecological examination (general) (routine) without abnormal findings: Secondary | ICD-10-CM | POA: Insufficient documentation

## 2024-09-10 DIAGNOSIS — Z1331 Encounter for screening for depression: Secondary | ICD-10-CM | POA: Diagnosis not present

## 2024-09-10 DIAGNOSIS — M6289 Other specified disorders of muscle: Secondary | ICD-10-CM | POA: Diagnosis not present

## 2024-09-10 DIAGNOSIS — N3281 Overactive bladder: Secondary | ICD-10-CM | POA: Insufficient documentation

## 2024-09-10 DIAGNOSIS — Z124 Encounter for screening for malignant neoplasm of cervix: Secondary | ICD-10-CM

## 2024-09-10 NOTE — Assessment & Plan Note (Signed)
 Cervical cancer screening performed according to ASCCP guidelines. Encouraged annual mammogram screening Colonoscopy UTD DXA due, f/u with PCP or endocrine Labs and immunizations with her primary Encouraged safe sexual practices as indicated Encouraged healthy lifestyle practices with diet and exercise For patients over 84yo, I recommend 1200mg  calcium  daily and 800IU of vitamin D  daily.

## 2024-09-10 NOTE — Patient Instructions (Signed)
 For patients under 50-84yo, I recommend 1200mg  calcium  daily and 600IU of vitamin D daily. For patients over 84yo, I recommend 1200mg  calcium  daily and 800IU of vitamin D daily.  Health Maintenance, Female Adopting a healthy lifestyle and getting preventive care are important in promoting health and wellness. Ask your health care provider about: The right schedule for you to have regular tests and exams. Things you can do on your own to prevent diseases and keep yourself healthy. What should I know about diet, weight, and exercise? Eat a healthy diet  Eat a diet that includes plenty of vegetables, fruits, low-fat dairy products, and lean protein. Do not eat a lot of foods that are high in solid fats, added sugars, or sodium. Maintain a healthy weight Body mass index (BMI) is used to identify weight problems. It estimates body fat based on height and weight. Your health care provider can help determine your BMI and help you achieve or maintain a healthy weight. Get regular exercise Get regular exercise. This is one of the most important things you can do for your health. Most adults should: Exercise for at least 150 minutes each week. The exercise should increase your heart rate and make you sweat (moderate-intensity exercise). Do strengthening exercises at least twice a week. This is in addition to the moderate-intensity exercise. Spend less time sitting. Even light physical activity can be beneficial. Watch cholesterol and blood lipids Have your blood tested for lipids and cholesterol at 84 years of age, then have this test every 5 years. Have your cholesterol levels checked more often if: Your lipid or cholesterol levels are high. You are older than 84 years of age. You are at high risk for heart disease. What should I know about cancer screening? Depending on your health history and family history, you may need to have cancer screening at various ages. This may include screening  for: Breast cancer. Cervical cancer. Colorectal cancer. Skin cancer. Lung cancer. What should I know about heart disease, diabetes, and high blood pressure? Blood pressure and heart disease High blood pressure causes heart disease and increases the risk of stroke. This is more likely to develop in people who have high blood pressure readings or are overweight. Have your blood pressure checked: Every 3-5 years if you are 25-57 years of age. Every year if you are 24 years old or older. Diabetes Have regular diabetes screenings. This checks your fasting blood sugar level. Have the screening done: Once every three years after age 62 if you are at a normal weight and have a low risk for diabetes. More often and at a younger age if you are overweight or have a high risk for diabetes. What should I know about preventing infection? Hepatitis B If you have a higher risk for hepatitis B, you should be screened for this virus. Talk with your health care provider to find out if you are at risk for hepatitis B infection. Hepatitis C Testing is recommended for: Everyone born from [birth year] through 1965. Anyone with known risk factors for hepatitis C. Sexually transmitted infections (STIs) Get screened for STIs, including gonorrhea and chlamydia, if: You are sexually active and are younger than 84 years of age. You are older than 84 years of age and your health care provider tells you that you are at risk for this type of infection. Your sexual activity has changed since you were last screened, and you are at increased risk for chlamydia or gonorrhea. Ask your health care provider if  you are at risk. Ask your health care provider about whether you are at high risk for HIV. Your health care provider may recommend a prescription medicine to help prevent HIV infection. If you choose to take medicine to prevent HIV, you should first get tested for HIV. You should then be tested every 3 months for as long as you  are taking the medicine. Osteoporosis and menopause Osteoporosis is a disease in which the bones lose minerals and strength with aging. This can result in bone fractures. If you are 72 years old or older, or if you are at risk for osteoporosis and fractures, ask your health care provider if you should: Be screened for bone loss. Take a calcium  or vitamin D supplement to lower your risk of fractures. Be given hormone replacement therapy (HRT) to treat symptoms of menopause. Follow these instructions at home: Alcohol use Do not drink alcohol if: Your health care provider tells you not to drink. You are pregnant, may be pregnant, or are planning to become pregnant. If you drink alcohol: Limit how much you have to: 0-1 drink a day. Know how much alcohol is in your drink. In the [place] one drink equals one 12 oz bottle of beer (355 mL), one 5 oz glass of wine (148 mL), or one 1 oz glass of hard liquor (44 mL). Lifestyle Do not use any products that contain nicotine or tobacco. These products include cigarettes, chewing tobacco, and vaping devices, such as e-cigarettes. If you need help quitting, ask your health care provider. Do not use street drugs. Do not share needles. Ask your health care provider for help if you need support or information about quitting drugs. General instructions Schedule regular health, dental, and eye exams. Stay current with your vaccines. Tell your health care provider if: You often feel depressed. You have ever been abused or do not feel safe at home. Summary Adopting a healthy lifestyle and getting preventive care are important in promoting health and wellness. Follow your health care provider's instructions about healthy diet, exercising, and getting tested or screened for diseases. Follow your health care provider's instructions on monitoring your cholesterol and blood pressure. This information is not intended to replace advice given to you by your health  care provider. Make sure you discuss any questions you have with your health care provider. Document Revised: 02/13/2021 Document Reviewed: 02/13/2021 Elsevier Patient Education  2024 [employer].

## 2024-09-15 ENCOUNTER — Ambulatory Visit: Payer: Self-pay | Admitting: Obstetrics and Gynecology

## 2024-09-15 LAB — CYTOLOGY - PAP: Diagnosis: NEGATIVE

## 2024-09-15 NOTE — Telephone Encounter (Signed)
 Spoke to patient she is aware of results and voiced understanding.

## 2024-09-17 ENCOUNTER — Ambulatory Visit: Payer: Self-pay

## 2024-09-17 NOTE — Telephone Encounter (Signed)
 FYI Only or Action Required?: FYI only for provider: appointment scheduled on tomorrow morning.  Patient was last seen in primary care on 06/16/2024 by Rollene Almarie LABOR, MD.  Called Nurse Triage reporting Cough. - more like clearing throat  - sometimes bringing up clear or yellow mucous. Had a cold a little while ago  Symptoms began a week ago.  Interventions attempted: Nothing.  Symptoms are: gradually worsening.  Triage Disposition: Home Care - pt has CHF and wants to be seen  Patient/caregiver understands and will follow disposition?: Yes                       Copied from CRM #[id number]. Topic: Clinical - Red Word Triage >> Sep 17, 2024  8:04 AM Charlet HERO wrote: Red Word that prompted transfer to Nurse Triage: Patient is calling about cough she also has mucus that is clear some times and sometime yellow. [place]. Reason for Disposition  Cough with cold symptoms (e.g., runny nose, postnasal drip, throat clearing)  Answer Assessment - Initial Assessment Questions 1. ONSET: When did the cough begin?      1 week ago 2. SEVERITY: How bad is the cough today?      Clears throat about every 10 -15 minutes 3. SPUTUM: Describe the color of your sputum (e.g., none, dry cough; clear, white, yellow, green)     Clear - yellow sometimes 4. HEMOPTYSIS: Are you coughing up any blood? If Yes, ask: How much? (e.g., flecks, streaks, tablespoons, etc.)     no 5. DIFFICULTY BREATHING: Are you having difficulty breathing? If Yes, ask: How bad is it? (e.g., mild, moderate, severe)      no 6. FEVER: Do you have a fever? If Yes, ask: What is your temperature, how was it measured, and when did it start?     no 7. CARDIAC HISTORY: Do you have any history of heart disease? (e.g., heart attack, congestive heart failure)      chf 8. LUNG HISTORY: Do you have any history of lung disease?  (e.g., pulmonary embolus, asthma, emphysema)     no  10. OTHER SYMPTOMS:  Do you have any other symptoms? (e.g., runny nose, wheezing, chest pain)       Sinus congestion  Protocols used: Cough - Acute Productive-A-AH

## 2024-09-17 NOTE — Progress Notes (Unsigned)
° °  Acute Office Visit  Subjective:     Patient ID: Jenny Madden, female    DOB: 02-Oct-1941, 83 y.o.   MRN: [id number]  No chief complaint on file.   HPI  Discussed the use of AI scribe software for clinical note transcription with the patient, who gave verbal consent to proceed.  History of Present Illness Jenny Madden is an 83 year old female who presents with persistent coughing and congestion.  Cough and upper respiratory symptoms - Persistent cough and congestion - Initial improvement followed by recurrence of symptoms - No fever - Productive cough with phlegm expectorated this morning - No headache or ear pain  Gastrointestinal symptoms - Mild nausea - Reduced appetite - Stomach upset worsened by coffee, attributed to reflux  Medication allergies - History of allergic reaction to an antibiotic a few years ago; specific medication unknown     ROS Per HPI      Objective:    There were no vitals taken for this visit.   Physical Exam Vitals and nursing note reviewed.  Constitutional:      General: She is not in acute distress.    Appearance: Normal appearance. She is normal weight.  HENT:     Head: Normocephalic and atraumatic.     Right Ear: External ear normal.     Left Ear: External ear normal.     Nose: Nose normal.     Mouth/Throat:     Mouth: Mucous membranes are moist.     Pharynx: Oropharynx is clear.  Eyes:     Extraocular Movements: Extraocular movements intact.     Pupils: Pupils are equal, round, and reactive to light.  Cardiovascular:     Rate and Rhythm: Normal rate and regular rhythm.     Pulses: Normal pulses.     Heart sounds: Normal heart sounds.  Pulmonary:     Effort: Pulmonary effort is normal. No respiratory distress.     Breath sounds: Normal breath sounds. No wheezing, rhonchi or rales.  Musculoskeletal:        General: Normal range of motion.     Cervical back: Normal range of motion.     Right lower leg: No  edema.     Left lower leg: No edema.  Lymphadenopathy:     Cervical: No cervical adenopathy.  Neurological:     General: No focal deficit present.     Mental Status: She is alert and oriented to person, place, and time.  Psychiatric:        Mood and Affect: Mood normal.        Thought Content: Thought content normal.     No results found for any visits on 09/18/24.      Assessment & Plan:   Assessment and Plan Assessment & Plan Acute upper respiratory infection Likely bacterial infection due to negative COVID and flu tests. - Prescribed azithromycin : 2 tablets on day 1, then 1 tablet daily for 4 days. - Advised to monitor symptoms and report if no improvement by Monday.     No orders of the defined types were placed in this encounter.    No orders of the defined types were placed in this encounter.   No follow-ups on file.  Jenny LITTIE Ku, FNP

## 2024-09-18 ENCOUNTER — Encounter: Payer: Self-pay | Admitting: Family Medicine

## 2024-09-18 ENCOUNTER — Ambulatory Visit: Payer: Medicare (Managed Care) | Admitting: Family Medicine

## 2024-09-18 VITALS — BP 122/60 | HR 73 | Temp 98.8°F | Ht 62.75 in | Wt 145.6 lb

## 2024-09-18 DIAGNOSIS — J069 Acute upper respiratory infection, unspecified: Secondary | ICD-10-CM | POA: Diagnosis not present

## 2024-09-18 DIAGNOSIS — B9689 Other specified bacterial agents as the cause of diseases classified elsewhere: Secondary | ICD-10-CM

## 2024-09-18 DIAGNOSIS — R0981 Nasal congestion: Secondary | ICD-10-CM

## 2024-09-18 LAB — POC COVID19 BINAXNOW: SARS Coronavirus 2 Ag: NEGATIVE

## 2024-09-18 LAB — POCT INFLUENZA A/B
Influenza A, POC: NEGATIVE
Influenza B, POC: NEGATIVE

## 2024-09-18 MED ORDER — AZITHROMYCIN 250 MG PO TABS: ORAL_TABLET | ORAL | 0 refills | Status: AC

## 2024-09-18 NOTE — Patient Instructions (Signed)
 I have sent in azithromycin  for you to take. Take 2 tablets today, then 1 tablet daily for the next 4 days.  Flu and COVID testing was negative.   Follow-up with me for new or worsening symptoms.

## 2024-09-21 ENCOUNTER — Telehealth: Payer: Self-pay

## 2024-09-21 NOTE — Telephone Encounter (Signed)
 Copied from CRM [id number]. Topic: Clinical - Medication Question >> Sep 21, 2024 12:29 PM [name] wrote: Reason for CRM: Patient wants to give Jenny Madden an update after taking the azithromycin  (ZITHROMAX ) 250 MG tablet - she took 2 Saturday 1 yesterday and 1 this morning and says doing better but still having mucus and slight cough at night and appetite coming back.

## 2024-09-28 ENCOUNTER — Other Ambulatory Visit: Payer: Self-pay | Admitting: Internal Medicine

## 2024-10-14 ENCOUNTER — Encounter: Payer: Self-pay | Admitting: Internal Medicine

## 2024-10-14 ENCOUNTER — Ambulatory Visit: Payer: Medicare (Managed Care) | Admitting: Internal Medicine

## 2024-10-14 ENCOUNTER — Ambulatory Visit: Payer: Medicare (Managed Care)

## 2024-10-14 VITALS — BP 122/62 | HR 71 | Ht 62.0 in | Wt 145.4 lb

## 2024-10-14 VITALS — BP 122/62 | HR 71 | Temp 97.8°F | Ht 62.0 in | Wt 145.4 lb

## 2024-10-14 DIAGNOSIS — I5032 Chronic diastolic (congestive) heart failure: Secondary | ICD-10-CM | POA: Diagnosis not present

## 2024-10-14 DIAGNOSIS — E782 Mixed hyperlipidemia: Secondary | ICD-10-CM | POA: Diagnosis not present

## 2024-10-14 DIAGNOSIS — I10 Essential (primary) hypertension: Secondary | ICD-10-CM | POA: Diagnosis not present

## 2024-10-14 DIAGNOSIS — D649 Anemia, unspecified: Secondary | ICD-10-CM

## 2024-10-14 DIAGNOSIS — Z0000 Encounter for general adult medical examination without abnormal findings: Secondary | ICD-10-CM

## 2024-10-14 DIAGNOSIS — R7303 Prediabetes: Secondary | ICD-10-CM | POA: Diagnosis not present

## 2024-10-14 DIAGNOSIS — K2100 Gastro-esophageal reflux disease with esophagitis, without bleeding: Secondary | ICD-10-CM | POA: Diagnosis not present

## 2024-10-14 LAB — COMPREHENSIVE METABOLIC PANEL WITH GFR
ALT: 9 U/L (ref 3–35)
AST: 18 U/L (ref 5–37)
Albumin: 4.2 g/dL (ref 3.5–5.2)
Alkaline Phosphatase: 65 U/L (ref 39–117)
BUN: 15 mg/dL (ref 6–23)
CO2: 26 meq/L (ref 19–32)
Calcium: 9.4 mg/dL (ref 8.4–10.5)
Chloride: 106 meq/L (ref 96–112)
Creatinine, Ser: 1.11 mg/dL (ref 0.40–1.20)
GFR: 45.75 mL/min — ABNORMAL LOW
Glucose, Bld: 81 mg/dL (ref 70–99)
Potassium: 3.9 meq/L (ref 3.5–5.1)
Sodium: 140 meq/L (ref 135–145)
Total Bilirubin: 0.4 mg/dL (ref 0.2–1.2)
Total Protein: 7.7 g/dL (ref 6.0–8.3)

## 2024-10-14 LAB — LIPID PANEL
Cholesterol: 187 mg/dL (ref 28–200)
HDL: 78 mg/dL
LDL Cholesterol: 99 mg/dL (ref 10–99)
NonHDL: 109.21
Total CHOL/HDL Ratio: 2
Triglycerides: 53 mg/dL (ref 10.0–149.0)
VLDL: 10.6 mg/dL (ref 0.0–40.0)

## 2024-10-14 LAB — CBC
HCT: 39 % (ref 36.0–46.0)
Hemoglobin: 12.1 g/dL (ref 12.0–15.0)
MCHC: 30.9 g/dL (ref 30.0–36.0)
MCV: 80.2 fl (ref 78.0–100.0)
Platelets: 212 K/uL (ref 150.0–400.0)
RBC: 4.86 Mil/uL (ref 3.87–5.11)
RDW: 15 % (ref 11.5–15.5)
WBC: 4.3 K/uL (ref 4.0–10.5)

## 2024-10-14 LAB — HEMOGLOBIN A1C: Hgb A1c MFr Bld: 6 % (ref 4.6–6.5)

## 2024-10-14 NOTE — Progress Notes (Signed)
" ° °  Subjective:   Patient ID: Jenny Madden, female    DOB: 05-12-1941, 84 y.o.   MRN: [id number]  The patient is here for physical. Pertinent topics discussed: Discussed the use of AI scribe software for clinical note transcription with the patient, who gave verbal consent to proceed.  History of Present Illness Jenny Madden is an 84 year old female who presents for a routine follow-up visit.  No new chest pain, tightness, or pressure. No breathing problems. She remains active and continues to walk regularly. No issues with swallowing.  Experiences heartburn and acid reflux, attributing it to dietary choices, but it is not bothersome as she is 'eating halfway right.'  Continues to experience constipation and occasionally takes a small amount of medical magnesium , which provides quick relief.  Reports bladder urgency and occasional incontinence if she does not go to the bathroom promptly. Exercises help 'a little bit,' but she admits to waiting until the last minute to urinate.  PMH, [facility], social history reviewed and updated  Review of Systems  Constitutional: Negative.   HENT: Negative.    Eyes: Negative.   Respiratory:  Negative for cough, chest tightness and shortness of breath.   Cardiovascular:  Negative for chest pain, palpitations and leg swelling.  Gastrointestinal:  Negative for abdominal distention, abdominal pain, constipation, diarrhea, nausea and vomiting.  Musculoskeletal: Negative.   Skin: Negative.   Neurological: Negative.   Psychiatric/Behavioral: Negative.      Objective:  Physical Exam Constitutional:      Appearance: She is well-developed.  HENT:     Head: Normocephalic and atraumatic.  Cardiovascular:     Rate and Rhythm: Normal rate and regular rhythm.  Pulmonary:     Effort: Pulmonary effort is normal. No respiratory distress.     Breath sounds: Normal breath sounds. No wheezing or rales.  Abdominal:     General: Bowel sounds are normal.  There is no distension.     Palpations: Abdomen is soft.     Tenderness: There is no abdominal tenderness.  Musculoskeletal:     Cervical back: Normal range of motion.  Skin:    General: Skin is warm and dry.     Comments: Foot exam done  Neurological:     Mental Status: She is alert and oriented to person, place, and time.     Coordination: Coordination normal.     Vitals:   10/14/24 1026  BP: 122/62  Pulse: 71  Temp: 97.8 F (36.6 C)  TempSrc: Oral  SpO2: 99%  Weight: 145 lb 6.4 oz (66 kg)  Height: 5' 2 (1.575 m)    Assessment & Plan:   "

## 2024-10-14 NOTE — Progress Notes (Addendum)
 "  Chief Complaint  Patient presents with   Medicare Wellness     Subjective:   Jenny Madden is a 84 y.o. female who presents for a Medicare Annual Wellness Visit.  Visit info / Clinical Intake: Medicare Wellness Visit Type:: Subsequent Annual Wellness Visit Persons participating in visit and providing information:: patient Medicare Wellness Visit Mode:: In-person (required for WTM) Interpreter Needed?: No Pre-visit prep was completed: yes AWV questionnaire completed by patient prior to visit?: yes Date:: 10/10/24 Living arrangements:: (!) lives alone Patient's Overall Health Status Rating: good Typical amount of pain: none Does pain affect daily life?: no Are you currently prescribed opioids?: no  Dietary Habits and Nutritional Risks How many meals a day?: 2 Eats fruit and vegetables daily?: yes Most meals are obtained by: preparing own meals In the last 2 weeks, have you had any of the following?: none Diabetic:: (!) yes (Dx- Pre-Diabetes) Any non-healing wounds?: no How often do you check your BS?: 1 (fasting - 113) Would you like to be referred to a Nutritionist or for Diabetic Management? : no  Functional Status Activities of Daily Living (to include ambulation/medication): Independent Ambulation: Independent with device- listed below Home Assistive Devices/Equipment: Eyeglasses; Dentures (specify type) Medication Administration: Independent Home Management (perform basic housework or laundry): Independent Manage your own finances?: yes Primary transportation is: driving Concerns about vision?: no *vision screening is required for WTM* Concerns about hearing?: no  Fall Screening Falls in the past year?: 0 Number of falls in past year: 0 Was there an injury with Fall?: 0 Fall Risk Category Calculator: 0 Patient Fall Risk Level: Low Fall Risk  Fall Risk Patient at Risk for Falls Due to: No Fall Risks Fall risk Follow up: Falls prevention discussed; Falls  evaluation completed  Home and Transportation Safety: All rugs have non-skid backing?: N/A, no rugs All stairs or steps have railings?: N/A, no stairs Grab bars in the bathtub or shower?: yes Have non-skid surface in bathtub or shower?: yes Good home lighting?: yes Regular seat belt use?: yes Hospital stays in the last year:: no  Cognitive Assessment Difficulty concentrating, remembering, or making decisions? : no Will 6CIT or Mini Cog be Completed: yes What year is it?: 0 points What month is it?: 0 points Give patient an address phrase to remember (5 components): [address] [place], [place] About what time is it?: 0 points Count backwards from 20 to 1: 0 points Say the months of the year in reverse: 0 points Repeat the address phrase from earlier: 0 points 6 CIT Score: 0 points  Advance Directives (For Healthcare) Does Patient Have a Medical Advance Directive?: Yes Does patient want to make changes to medical advance directive?: Yes (Inpatient - patient requests chaplain consult to change a medical advance directive) Type of Advance Directive: Healthcare Power of [place]; Living will Copy of Healthcare Power of Attorney in Chart?: Yes - validated most recent copy scanned in chart (See row information) Copy of Living Will in Chart?: Yes - validated most recent copy scanned in chart (See row information)  Reviewed/Updated  Reviewed/Updated: Reviewed All (Medical, Surgical, Family, Medications, Allergies, Care Teams, Patient Goals)    Allergies (verified) Metoprolol  succinate [metoprolol ]   Current Medications (verified) Outpatient Encounter Medications as of 10/14/2024  Medication Sig   acetaminophen  (TYLENOL ) 325 MG tablet Take 2 tablets (650 mg total) by mouth every 6 (six) hours as needed for moderate pain.   amLODipine  (NORVASC ) 5 MG tablet TAKE 1 TABLET DAILY   aspirin 81  MG tablet Take 81 mg by mouth every morning.   atorvastatin  (LIPITOR) 20 MG tablet TAKE 1 TABLET  DAILY   blood glucose meter kit and supplies Dispense based on patient and insurance preference. Use up to four times daily as directed. (E11.9).   Blood Glucose Monitoring Suppl (ONE TOUCH ULTRA 2) w/Device KIT USE TO CHECK BLOOD SUGAR   TWO TIMES A DAY   Cholecalciferol (VITAMIN D ) 2000 UNITS CAPS Take 2,000 Units by mouth daily.   clotrimazole -betamethasone  (LOTRISONE ) cream Apply 1 Application topically daily.   famotidine  (PEPCID ) 40 MG tablet TAKE 1 TABLET AT BEDTIME   fexofenadine  (ALLEGRA ) 180 MG tablet Take 1 tablet (180 mg total) by mouth daily. Take 1 tablet by mouth once daily as needed for allergies   fluticasone  (FLONASE ) 50 MCG/ACT nasal spray Place 2 sprays into both nostrils daily.   glucose blood (ONETOUCH ULTRA) test strip USE TO CHECK BLOOD SUGAR TWICE A DAY (ANNUAL APPOINTMENT DUE IN [place], MUST SEE PROVIDER FOR FURTHER REFILLS)   Lancets (ONETOUCH DELICA PLUS LANCET33G) MISC USE TO CHECK BLOOD SUGAR TWICE A DAY   pantoprazole  (PROTONIX ) 40 MG tablet TAKE 1 TABLET TWICE DAILY  BEFORE MEALS   polyethylene glycol powder (GAVILAX) 17 GM/SCOOP powder TAKE 17 G BY MOUTH 2 (TWO) TIMES DAILY AS NEEDED.   telmisartan  (MICARDIS ) 20 MG tablet TAKE 1 TABLET DAILY   triamcinolone  ointment (KENALOG ) 0.5 % Apply 1 Application topically 2 (two) times daily.   No facility-administered encounter medications on file as of 10/14/2024.    History: Past Medical History:  Diagnosis Date   Allergic rhinitis, cause unspecified    Anxiety state, unspecified    none recent   Benign neoplasm of colon    Cancer (HCC)    renal mass   Cataract    Chronic diastolic heart failure (HCC) 03/26/2022   Chronic kidney disease 01/2016   Tumor left side   Constipation 10/19/2015   Diverticulosis of colon (without mention of hemorrhage)    Dysphagia    Family history of adverse reaction to anesthesia 3 yrs ago   slow to awaken    Family history of malignant neoplasm of gastrointestinal tract     Heart murmur    Hypertension    Nonspecific (abnormal) findings on radiological and other examination of other intrathoracic organs    Nonspecific abnormal electrocardiogram (ECG) (EKG)    Osteopenia 12/27/2011   Osteoporosis    Other dysphagia    Pure hypercholesterolemia    Type II or unspecified type diabetes mellitus without mention of complication, not stated as uncontrolled    Unspecified essential hypertension    Unspecified tinnitus years ago   Vitamin D  deficiency 12/27/2011   Past Surgical History:  Procedure Laterality Date   CATARACT EXTRACTION     CESAREAN SECTION     x 1   COLONOSCOPY     colonscopy and endoscopy  5 yrs ago   FOOT SURGERY     bilateral for hammer toes   IR GENERIC HISTORICAL  12/06/2015   IR RADIOLOGIST EVAL & MGMT 12/06/2015 Marcey Moan, MD [facility]   IR GENERIC HISTORICAL  05/03/2016   IR RADIOLOGIST EVAL & MGMT 05/03/2016 Marcey Moan, MD [facility]   IR GENERIC HISTORICAL  02/09/2016   IR RADIOLOGIST EVAL & MGMT 02/09/2016 [facility]   IR RADIOLOGIST EVAL & MGMT  01/09/2017   IR RADIOLOGIST EVAL & MGMT  01/14/2018   IR RADIOLOGIST EVAL & MGMT  03/31/2019  IR RADIOLOGIST EVAL & MGMT  03/30/2020   LAPAROTOMY N/A 03/27/2022   Procedure: EXPLORATORY LAPAROTOMY, POSSIBLE SMALL BOWEL RESECTION;  Surgeon: Ebbie Cough, MD;  Location: [facility];  Service: General;  Laterality: N/A;   RADIOFREQUENCY ABLATION KIDNEY Left 01/2016   Renal mass   UPPER GASTROINTESTINAL ENDOSCOPY     Family History  Problem Relation Age of Onset   Colon cancer Brother 20   Esophageal cancer Neg Hx    Rectal cancer Neg Hx    Stomach cancer Neg Hx    Social History   Occupational History   Occupation: Retired  Tobacco Use   Smoking status: Former    Current packs/day: 0.00    Average packs/day: 0.5 packs/day for 30.0 years (15.0 ttl pk-yrs)    Types: Cigarettes    Start date: 10/08/1969    Quit date: 10/09/1999    Years since quitting: 25.0    Smokeless tobacco: Never  Vaping Use   Vaping status: Never Used  Substance and Sexual Activity   Alcohol use: Yes    Alcohol/week: 1.0 standard drink of alcohol    Types: 1 Standard drinks or equivalent per week    Comment: social use   Drug use: No   Sexual activity: Yes    Partners: Male    Birth control/protection: Post-menopausal    Comment: 1st intercourse- 21, partners- 3   Tobacco Counseling Counseling given: Yes  SDOH Screenings   Food Insecurity: No Food Insecurity (10/14/2024)  Housing: Unknown (10/14/2024)  Transportation Needs: No Transportation Needs (10/14/2024)  Utilities: Not At Risk (10/14/2024)  Alcohol Screen: Low Risk (09/17/2024)  Depression (PHQ2-9): Low Risk (10/14/2024)  Financial Resource Strain: Low Risk (09/17/2024)  Physical Activity: Insufficiently Active (10/14/2024)  Social Connections: Moderately Integrated (10/14/2024)  Stress: Stress Concern Present (10/14/2024)  Tobacco Use: Medium Risk (10/14/2024)  Health Literacy: Adequate Health Literacy (10/14/2024)   See flowsheets for full screening details  Depression Screen PHQ 2 & 9 Depression Scale- Over the past 2 weeks, how often have you been bothered by any of the following problems? Little interest or pleasure in doing things: 0 Feeling down, depressed, or hopeless (PHQ Adolescent also includes...irritable): 0 PHQ-2 Total Score: 0 Trouble falling or staying asleep, or sleeping too much: 0 Feeling tired or having little energy: 0 Poor appetite or overeating (PHQ Adolescent also includes...weight loss): 0 Feeling bad about yourself - or that you are a failure or have let yourself or your family down: 0 Trouble concentrating on things, such as reading the newspaper or watching television (PHQ Adolescent also includes...like school work): 0 Moving or speaking so slowly that other people could have noticed. Or the opposite - being so fidgety or restless that you have been moving around a lot more than usual:  0 Thoughts that you would be better off dead, or of hurting yourself in some way: 0 PHQ-9 Total Score: 0 If you checked off any problems, how difficult have these problems made it for you to do your work, take care of things at home, or get along with other people?: Not difficult at all  Depression Treatment Depression Interventions/Treatment : EYV7-0 Score <4 Follow-up Not Indicated     Goals Addressed               This Visit's Progress     Patient Stated (pt-stated)        Patient stated she plans to continue taking meds daily and managing health  Objective:    Today's Vitals   10/14/24 0907  BP: 122/62  Pulse: 71  SpO2: 93%  Weight: 145 lb 6.4 oz (66 kg)  Height: 5' 2 (1.575 m)   Body mass index is 26.59 kg/m.  Hearing/Vision screen Hearing Screening - Comments:: Denies hearing difficulties   Vision Screening - Comments:: Wears rx glasses - up to date with routine eye exams with Lamarr Burkitt Immunizations and Health Maintenance Health Maintenance  Topic Date Due   Diabetic kidney evaluation - Urine ACR  Never done   Zoster Vaccines- Shingrix (1 of 2) Never done   COVID-19 Vaccine (6 - 2025-26 season) 06/08/2024   Diabetic kidney evaluation - eGFR measurement  10/13/2024   HEMOGLOBIN A1C  10/08/2024   FOOT EXAM  10/13/2024   OPHTHALMOLOGY EXAM  12/19/2024   Medicare Annual Wellness (AWV)  10/14/2025   DTaP/Tdap/Td (3 - Td or Tdap) 01/07/2030   Pneumococcal Vaccine: 50+ Years  Completed   Influenza Vaccine  Completed   Bone Density Scan  Completed   Meningococcal B Vaccine  Aged Out   Colonoscopy  Discontinued        Assessment/Plan:  This is a routine wellness examination for Jaycelynn.  I have recommended that this patient have a immunization for Shingles but she declines at this time. I have discussed the risks and benefits of this procedure with her. The patient verbalizes understanding.   Patient Care Team: Rollene Almarie LABOR,  MD as PCP - General (Internal Medicine) Verlin Lonni BIRCH, MD as PCP - Cardiology (Cardiology) Teressa Toribio SQUIBB, MD (Inactive) as Attending Physician (Gastroenterology) Lelon JONELLE Ferrari, MD as Referring Physician (Ophthalmology) Douglass Delon CROME, RMA [place], Morna SAILOR, [facility] (Inactive) as Pharmacist (Pharmacist) Burkitt Lamarr, MD as Consulting Physician (Ophthalmology) Carlin Delon BROCKS, NP (Inactive) as Nurse Practitioner (Cardiology)  I have personally reviewed and noted the following in the patients chart:   Medical and social history Use of alcohol, tobacco or illicit drugs  Current medications and supplements including opioid prescriptions. Functional ability and status Nutritional status Physical activity Advanced directives List of other physicians Hospitalizations, surgeries, and ER visits in previous 12 months Vitals Screenings to include cognitive, depression, and falls Referrals and appointments  No orders of the defined types were placed in this encounter.  In addition, I have reviewed and discussed with patient certain preventive protocols, quality metrics, and best practice recommendations. A written personalized care plan for preventive services as well as general preventive health recommendations were provided to patient.   Verdie CHRISTELLA Saba, CMA   10/14/2024   Return in 1 year (on 10/14/2025).  After Visit Summary: (In Person-Declined) Patient declined AVS at this time.  Nurse Notes: Appointment(s) made: (scheduled 2027 AWV appt)  "

## 2024-10-14 NOTE — Patient Instructions (Addendum)
 Jenny Madden,  Thank you for taking the time for your Medicare Wellness Visit. I appreciate your continued commitment to your health goals. Please review the care plan we discussed, and feel free to reach out if I can assist you further.  Please note that Annual Wellness Visits do not include a physical exam. Some assessments may be limited, especially if the visit was conducted virtually. If needed, we may recommend an in-person follow-up with your provider.  Ongoing Care Seeing your primary care provider every 3 to 6 months helps us  monitor your health and provide consistent, personalized care.   Referrals If a referral was made during today's visit and you haven't received any updates within two weeks, please contact the referred provider directly to check on the status.  Recommended Screenings:  Health Maintenance  Topic Date Due   Yearly kidney health urinalysis for diabetes  Never done   Zoster (Shingles) Vaccine (1 of 2) Never done   COVID-19 Vaccine (6 - 2025-26 season) 06/08/2024   Yearly kidney function blood test for diabetes  10/13/2024   Hemoglobin A1C  10/08/2024   Complete foot exam   10/13/2024   Eye exam for diabetics  12/19/2024   Medicare Annual Wellness Visit  10/14/2025   DTaP/Tdap/Td vaccine (3 - Td or Tdap) 01/07/2030   Pneumococcal Vaccine for age over 77  Completed   Flu Shot  Completed   Osteoporosis screening with Bone Density Scan  Completed   Meningitis B Vaccine  Aged Out   Colon Cancer Screening  Discontinued       10/14/2024    9:07 AM  Advanced Directives  Does Patient Have a Medical Advance Directive? Yes  Type of Estate Agent of [place];Living will  Does patient want to make changes to medical advance directive? Yes (Inpatient - patient requests chaplain consult to change a medical advance directive)  Copy of Healthcare Power of Attorney in Chart? Yes - validated most recent copy scanned in chart (See row information)     Vision: Annual vision screenings are recommended for early detection of glaucoma, cataracts, and diabetic retinopathy. These exams can also reveal signs of chronic conditions such as diabetes and high blood pressure.  Dental: Annual dental screenings help detect early signs of oral cancer, gum disease, and other conditions linked to overall health, including heart disease and diabetes.

## 2024-10-15 NOTE — Assessment & Plan Note (Signed)
 Flu shot yearly. Pneumonia complete. Shingrix due at pharmacy. Tetanus up to date. Colonoscopy aged out. Mammogram aged out, pap smear up to date and dexa complete. Counseled about sun safety and mole surveillance. Counseled about the dangers of distracted driving. Given 10 year screening recommendations.

## 2024-10-15 NOTE — Assessment & Plan Note (Signed)
 Taking protonix  BID and overall stable symptoms.

## 2024-10-15 NOTE — Assessment & Plan Note (Signed)
 Checking HgA1c and adjust as needed.

## 2024-10-15 NOTE — Assessment & Plan Note (Signed)
 BP at goal on regimen checking CMP and adjust as needed.

## 2024-10-15 NOTE — Assessment & Plan Note (Signed)
 Checking lipid panel and adjust as needed.

## 2024-10-15 NOTE — Assessment & Plan Note (Signed)
 Stable without flare and without diuretics. On ARB. Continue.

## 2024-10-15 NOTE — Assessment & Plan Note (Signed)
Checking CBC. Adjust as needed.  

## 2024-10-16 ENCOUNTER — Ambulatory Visit: Payer: Self-pay | Admitting: Internal Medicine

## 2024-11-13 ENCOUNTER — Telehealth: Payer: Self-pay

## 2024-11-13 NOTE — Telephone Encounter (Signed)
 Copied from CRM #[id number]. Topic: Clinical - Prescription Issue >> Nov 13, 2024  4:14 PM Delon T wrote: Reason for CRM: need to change diabetic supply to Freestyle Lite Glucose test strips and Freestyle Freedom Lite meter

## 2025-10-15 ENCOUNTER — Ambulatory Visit: Payer: Medicare (Managed Care)

## 2025-10-15 ENCOUNTER — Encounter: Payer: Medicare (Managed Care) | Admitting: Internal Medicine
